# Patient Record
Sex: Male | Born: 1940 | Race: White | Hispanic: No | Marital: Married | Smoking: Former smoker
Health system: Southern US, Community
[De-identification: ages and names within clinical notes are randomized; demographics above are authoritative.]

## PROBLEM LIST (undated history)

## (undated) DIAGNOSIS — I2510 Atherosclerotic heart disease of native coronary artery without angina pectoris: Secondary | ICD-10-CM

## (undated) DIAGNOSIS — E785 Hyperlipidemia, unspecified: Secondary | ICD-10-CM

## (undated) DIAGNOSIS — H409 Unspecified glaucoma: Secondary | ICD-10-CM

## (undated) DIAGNOSIS — N289 Disorder of kidney and ureter, unspecified: Secondary | ICD-10-CM

## (undated) DIAGNOSIS — G629 Polyneuropathy, unspecified: Secondary | ICD-10-CM

## (undated) DIAGNOSIS — J984 Other disorders of lung: Secondary | ICD-10-CM

## (undated) DIAGNOSIS — N189 Chronic kidney disease, unspecified: Secondary | ICD-10-CM

## (undated) DIAGNOSIS — K769 Liver disease, unspecified: Secondary | ICD-10-CM

## (undated) DIAGNOSIS — J189 Pneumonia, unspecified organism: Secondary | ICD-10-CM

## (undated) DIAGNOSIS — I519 Heart disease, unspecified: Secondary | ICD-10-CM

## (undated) DIAGNOSIS — G709 Myoneural disorder, unspecified: Secondary | ICD-10-CM

## (undated) DIAGNOSIS — G4730 Sleep apnea, unspecified: Secondary | ICD-10-CM

## (undated) DIAGNOSIS — I219 Acute myocardial infarction, unspecified: Secondary | ICD-10-CM

## (undated) DIAGNOSIS — C801 Malignant (primary) neoplasm, unspecified: Secondary | ICD-10-CM

## (undated) DIAGNOSIS — I10 Essential (primary) hypertension: Secondary | ICD-10-CM

## (undated) DIAGNOSIS — Z87442 Personal history of urinary calculi: Secondary | ICD-10-CM

## (undated) HISTORY — PX: CORONARY ARTERY BYPASS GRAFT: SHX141

## (undated) HISTORY — DX: Heart disease, unspecified: I51.9

## (undated) HISTORY — PX: LUNG SURGERY: SHX703

## (undated) HISTORY — DX: Liver disease, unspecified: K76.9

## (undated) HISTORY — DX: Sleep apnea, unspecified: G47.30

## (undated) HISTORY — DX: Essential (primary) hypertension: I10

## (undated) HISTORY — DX: Other disorders of lung: J98.4

## (undated) HISTORY — PX: EYE SURGERY: SHX253

## (undated) HISTORY — DX: Chronic kidney disease, unspecified: N18.9

---

## 1996-01-14 HISTORY — PX: CORONARY ARTERY BYPASS GRAFT: SHX141

## 1997-04-11 ENCOUNTER — Encounter (HOSPITAL_COMMUNITY): Admission: RE | Admit: 1997-04-11 | Discharge: 1997-07-10 | Payer: Self-pay | Admitting: Family Medicine

## 1997-04-13 ENCOUNTER — Encounter (HOSPITAL_COMMUNITY): Admission: RE | Admit: 1997-04-13 | Discharge: 1997-07-12 | Payer: Self-pay | Admitting: Cardiovascular Disease

## 2003-01-09 ENCOUNTER — Encounter: Admission: RE | Admit: 2003-01-09 | Discharge: 2003-01-09 | Payer: Self-pay | Admitting: Cardiology

## 2003-01-19 ENCOUNTER — Encounter: Admission: RE | Admit: 2003-01-19 | Discharge: 2003-01-19 | Payer: Self-pay | Admitting: Cardiology

## 2008-06-22 ENCOUNTER — Encounter: Admission: RE | Admit: 2008-06-22 | Discharge: 2008-06-22 | Payer: Self-pay | Admitting: Cardiology

## 2008-06-30 ENCOUNTER — Ambulatory Visit (HOSPITAL_COMMUNITY): Admission: RE | Admit: 2008-06-30 | Discharge: 2008-06-30 | Payer: Self-pay | Admitting: Cardiology

## 2008-07-26 ENCOUNTER — Encounter: Payer: Self-pay | Admitting: Cardiovascular Disease

## 2008-07-26 ENCOUNTER — Encounter: Payer: Self-pay | Admitting: Internal Medicine

## 2008-08-28 ENCOUNTER — Encounter: Admission: RE | Admit: 2008-08-28 | Discharge: 2008-08-28 | Payer: Self-pay | Admitting: Chiropractic Medicine

## 2009-10-18 ENCOUNTER — Ambulatory Visit (HOSPITAL_COMMUNITY): Admission: RE | Admit: 2009-10-18 | Discharge: 2009-10-18 | Payer: Self-pay | Admitting: Gastroenterology

## 2010-05-28 NOTE — Cardiovascular Report (Signed)
Joseph Bryan, REISTER NO.:  [id number]   MEDICAL RECORD NO.:  [id number]          PATIENT TYPE:  OIB   LOCATION:  2899                         FACILITY:  [facility]   PHYSICIAN:  Cristy Hilts. Jacinto Halim, MD       DATE OF BIRTH:  September 23, 1940   DATE OF PROCEDURE:  06/30/2008  DATE OF DISCHARGE:  06/30/2008                            CARDIAC CATHETERIZATION   PROCEDURES PERFORMED:  1. Left ventriculography.  2. Selective right and left coronary arteriography.  3. Saphenous vein graft and left internal mammary arteriography.   INDICATIONS:  Mr. Engram is a 70 year old gentleman with morbid obesity,  known coronary artery disease status post CABG in 1998 with LIMA to LAD,  SVG to OM-1 and OM-3, and history of anterior wall myocardial infarction  prior to his bypass surgery for which he underwent balloon angioplasty  at that time followed by bypass surgery the next day.  He had been  complaining of increasing shortness of breath.  Given this, he is now  brought to the cardiac catheterization lab to evaluate his coronary  anatomy for progression of coronary disease.  He is morbidly obese  gentleman.   HEMODYNAMIC DATA:  The left ventricular pressure was 77/3 with end-  diastolic of 9 mmHg.  Aortic pressure was 73/48 with a mean of 58 mmHg.  There was no significant gradient across the aortic valve.   ANGIOGRAPHIC DATA:  Left ventricle.  Left ventricular systolic function  was normal with the ejection fraction of 55%.  There was no regional  wall motion abnormality.   Right coronary artery.  Right coronary artery was a large-caliber  dominant vessel.  It has got mild luminal irregularity.   Left main coronary artery.  Left main coronary artery is a moderate-  caliber vessel with a distal left main of 40% stenoses.   Circumflex coronary artery.  Circumflex coronary artery has an ostial  80% stenosis.  It gives origin to large obtuse marginal and obtuse  marginal 2.  The obtuse  marginal 1 is occluded and is supplied by  saphenous vein graft.  There is competitive filling noted in the distal  circumflex coronary artery.   SVG to OM-1 and OM-2.  SVG to OM-1 and OM-2 is widely patent.   LAD.  LAD is a large-caliber vessel giving origin to large diagonal 1.  The diagonal 1 has an ostial 80-90% stenosis.  The ostium of the LAD has  a 70-80% stenosis.  There is complicated filling and retrograde filling  of the LAD via the saphenous vein graft to the diagonal.   SVG to D1.  SVG to D1 is widely patent and retrogradely fills the LAD.  There is also competitive filling noted in the distal LAD.   LIMA to LAD.  LIMA to LAD is widely patent.   IMPRESSION:  1. Shortness of breath probably secondary to diastolic dysfunction and      morbid obesity.  2. Hypotension resolved with intravenous fluids.  The patient had      severe nausea and vomiting last night, I suspect he probably has  acute gastritis, which is resolved, feels comfortable now.   RECOMMENDATIONS:  1. We will place him on Ranexa at 5 mg p.o. b.i.d. to see whether this      symptoms of shortness breath will improve, which could be his      anginal equivalent as previously his myocardial infarction was      evident from shortness of breath.  If he does not respond to      Ranexa, we can certainly stop this medication.  2. Weight loss would certainly help.  We will be discharging him today      if he remains nausea free and we will follow him up in the      outpatient basis.   Total of 130 mL of contrast was utilized for diagnostic angiography.   TECHNIQUE OF PROCEDURE:  Under usual sterile precaution using a left  radial access using 5-French sheath, a Jackey  5-French catheter was advanced into the left ventricle and left  ventriculography was performed both in LAO and RAO projection.  Then,  the same catheter was utilized to engage the left and right coronary  arteries and also saphenous vein  graft.  The catheter was then pulled  out of body.  A 4-French LIMA catheter was utilized to engage the left  internal mammary artery and angiography was performed using the catheter  and pulled out of the body.   The radial access sheath was pulled out after applying the TR band with  adequate hemostasis.  The tolerated the procedure well.  No immediate  complications were noted.      Cristy Hilts. Jacinto Halim, MD     JRG/MEDQ  D:  06/30/2008  T:  07/01/2008  Job:  [id number]   cc:   Windle Guard, M.D.

## 2012-01-14 HISTORY — PX: APPENDECTOMY: SHX54

## 2012-08-25 ENCOUNTER — Inpatient Hospital Stay (HOSPITAL_COMMUNITY)
Admission: EM | Admit: 2012-08-25 | Discharge: 2012-09-05 | DRG: 330 | Disposition: A | Discharge status: Home / Self Care | Payer: Medicare Other | Attending: Surgery | Admitting: Surgery

## 2012-08-25 ENCOUNTER — Encounter (HOSPITAL_COMMUNITY): Payer: Self-pay | Admitting: Emergency Medicine

## 2012-08-25 ENCOUNTER — Emergency Department (HOSPITAL_COMMUNITY): Payer: Medicare Other

## 2012-08-25 DIAGNOSIS — E669 Obesity, unspecified: Secondary | ICD-10-CM

## 2012-08-25 DIAGNOSIS — C181 Malignant neoplasm of appendix: Principal | ICD-10-CM | POA: Diagnosis present

## 2012-08-25 DIAGNOSIS — Z6841 Body Mass Index (BMI) 40.0 and over, adult: Secondary | ICD-10-CM

## 2012-08-25 DIAGNOSIS — I4510 Unspecified right bundle-branch block: Secondary | ICD-10-CM | POA: Diagnosis present

## 2012-08-25 DIAGNOSIS — I2510 Atherosclerotic heart disease of native coronary artery without angina pectoris: Secondary | ICD-10-CM

## 2012-08-25 DIAGNOSIS — G4733 Obstructive sleep apnea (adult) (pediatric): Secondary | ICD-10-CM | POA: Diagnosis present

## 2012-08-25 DIAGNOSIS — R601 Generalized edema: Secondary | ICD-10-CM

## 2012-08-25 DIAGNOSIS — E785 Hyperlipidemia, unspecified: Secondary | ICD-10-CM | POA: Diagnosis present

## 2012-08-25 DIAGNOSIS — Z87891 Personal history of nicotine dependence: Secondary | ICD-10-CM

## 2012-08-25 DIAGNOSIS — K529 Noninfective gastroenteritis and colitis, unspecified: Secondary | ICD-10-CM

## 2012-08-25 DIAGNOSIS — I10 Essential (primary) hypertension: Secondary | ICD-10-CM

## 2012-08-25 DIAGNOSIS — R000 Tachycardia, unspecified: Secondary | ICD-10-CM

## 2012-08-25 DIAGNOSIS — R609 Edema, unspecified: Secondary | ICD-10-CM | POA: Diagnosis not present

## 2012-08-25 DIAGNOSIS — Z951 Presence of aortocoronary bypass graft: Secondary | ICD-10-CM

## 2012-08-25 DIAGNOSIS — I498 Other specified cardiac arrhythmias: Secondary | ICD-10-CM | POA: Diagnosis present

## 2012-08-25 DIAGNOSIS — R1031 Right lower quadrant pain: Secondary | ICD-10-CM

## 2012-08-25 DIAGNOSIS — M7989 Other specified soft tissue disorders: Secondary | ICD-10-CM | POA: Diagnosis not present

## 2012-08-25 DIAGNOSIS — Z9861 Coronary angioplasty status: Secondary | ICD-10-CM

## 2012-08-25 DIAGNOSIS — E876 Hypokalemia: Secondary | ICD-10-CM | POA: Diagnosis not present

## 2012-08-25 DIAGNOSIS — A0472 Enterocolitis due to Clostridium difficile, not specified as recurrent: Secondary | ICD-10-CM

## 2012-08-25 DIAGNOSIS — A4902 Methicillin resistant Staphylococcus aureus infection, unspecified site: Secondary | ICD-10-CM | POA: Diagnosis present

## 2012-08-25 DIAGNOSIS — E871 Hypo-osmolality and hyponatremia: Secondary | ICD-10-CM | POA: Diagnosis not present

## 2012-08-25 DIAGNOSIS — K559 Vascular disorder of intestine, unspecified: Secondary | ICD-10-CM | POA: Diagnosis present

## 2012-08-25 DIAGNOSIS — E8779 Other fluid overload: Secondary | ICD-10-CM | POA: Diagnosis not present

## 2012-08-25 HISTORY — DX: Atherosclerotic heart disease of native coronary artery without angina pectoris: I25.10

## 2012-08-25 HISTORY — DX: Acute myocardial infarction, unspecified: I21.9

## 2012-08-25 LAB — CBC WITH DIFFERENTIAL/PLATELET
Basophils Absolute: 0 10*3/uL (ref 0.0–0.1)
Basophils Relative: 0 % (ref 0–1)
Eosinophils Absolute: 0 10*3/uL (ref 0.0–0.7)
Eosinophils Relative: 0 % (ref 0–5)
HCT: 42.9 % (ref 39.0–52.0)
Hemoglobin: 13.8 g/dL (ref 13.0–17.0)
Lymphocytes Relative: 8 % — ABNORMAL LOW (ref 12–46)
Lymphs Abs: 1.4 10*3/uL (ref 0.7–4.0)
MCH: 29.5 pg (ref 26.0–34.0)
MCHC: 32.2 g/dL (ref 30.0–36.0)
MCV: 91.7 fL (ref 78.0–100.0)
Monocytes Absolute: 1.1 10*3/uL — ABNORMAL HIGH (ref 0.1–1.0)
Monocytes Relative: 7 % (ref 3–12)
Neutro Abs: 13.8 10*3/uL — ABNORMAL HIGH (ref 1.7–7.7)
Neutrophils Relative %: 85 % — ABNORMAL HIGH (ref 43–77)
Platelets: 224 10*3/uL (ref 150–400)
RBC: 4.68 MIL/uL (ref 4.22–5.81)
RDW: 14.2 % (ref 11.5–15.5)
WBC: 16.3 10*3/uL — ABNORMAL HIGH (ref 4.0–10.5)

## 2012-08-25 LAB — URINALYSIS, ROUTINE W REFLEX MICROSCOPIC
Bilirubin Urine: NEGATIVE
Glucose, UA: NEGATIVE mg/dL
Hgb urine dipstick: NEGATIVE
Ketones, ur: NEGATIVE mg/dL
Nitrite: NEGATIVE
Protein, ur: NEGATIVE mg/dL
Specific Gravity, Urine: 1.018 (ref 1.005–1.030)
Urobilinogen, UA: 0.2 mg/dL (ref 0.0–1.0)
pH: 6 (ref 5.0–8.0)

## 2012-08-25 LAB — COMPREHENSIVE METABOLIC PANEL
ALT: 29 U/L (ref 0–53)
AST: 26 U/L (ref 0–37)
Albumin: 3.6 g/dL (ref 3.5–5.2)
Alkaline Phosphatase: 121 U/L — ABNORMAL HIGH (ref 39–117)
BUN: 14 mg/dL (ref 6–23)
CO2: 30 mEq/L (ref 19–32)
Calcium: 9.7 mg/dL (ref 8.4–10.5)
Chloride: 94 mEq/L — ABNORMAL LOW (ref 96–112)
Creatinine, Ser: 0.94 mg/dL (ref 0.50–1.35)
GFR calc Af Amer: >90 mL/min (ref >90)
GFR calc non Af Amer: 82 mL/min — ABNORMAL LOW (ref >90)
Glucose, Bld: 117 mg/dL — ABNORMAL HIGH (ref 70–99)
Potassium: 3.9 mEq/L (ref 3.5–5.1)
Sodium: 131 mEq/L — ABNORMAL LOW (ref 135–145)
Total Bilirubin: 0.7 mg/dL (ref 0.3–1.2)
Total Protein: 7.8 g/dL (ref 6.0–8.3)

## 2012-08-25 LAB — URINE MICROSCOPIC-ADD ON

## 2012-08-25 MED ORDER — MORPHINE SULFATE 4 MG/ML IJ SOLN
4.0000 mg | Freq: Once | INTRAMUSCULAR | Status: AC
  Administered 2012-08-25: 4 mg via INTRAVENOUS
  Filled 2012-08-25: qty 1

## 2012-08-25 MED ORDER — ONDANSETRON HCL 4 MG/2ML IJ SOLN
4.0000 mg | Freq: Once | INTRAMUSCULAR | Status: AC
  Administered 2012-08-25: 4 mg via INTRAVENOUS
  Filled 2012-08-25: qty 2

## 2012-08-25 MED ORDER — PIPERACILLIN-TAZOBACTAM 3.375 G IVPB
3.3750 g | Freq: Once | INTRAVENOUS | Status: AC
  Administered 2012-08-25: 3.375 g via INTRAVENOUS
  Filled 2012-08-25: qty 50

## 2012-08-25 MED ORDER — IOHEXOL 300 MG/ML  SOLN
100.0000 mL | Freq: Once | INTRAMUSCULAR | Status: AC | PRN
  Administered 2012-08-25: 100 mL via INTRAVENOUS

## 2012-08-25 MED ORDER — IOHEXOL 300 MG/ML  SOLN
50.0000 mL | Freq: Once | INTRAMUSCULAR | Status: AC | PRN
  Administered 2012-08-25: 50 mL via ORAL

## 2012-08-25 MED ORDER — SODIUM CHLORIDE 0.9 % IV BOLUS (SEPSIS)
500.0000 mL | Freq: Once | INTRAVENOUS | Status: AC
  Administered 2012-08-25: 18:00:00 via INTRAVENOUS

## 2012-08-25 NOTE — H&P (Signed)
Joseph Bryan is an 72 y.o. male.   Chief Complaint: abdominal pain HPI: The pt is a 72 yo wm who first started having RLQ pain last night. He denies any fever or chills. He had some dry heaves last night and this am but feels better now and is hungry. He went to his medical doctor today who sent him to ER to get a CT scan. His scan shows circumferential thickening with some inflammation of cecum. Appendix was not well visualized. Appearance seems unusual for appendicitis and seems more consistent with colitis or possible tumor. WBC is 16.3  Past Medical History  Diagnosis Date  . Coronary artery disease     Past Surgical History  Procedure Laterality Date  . Lung surgery      right and left lungs - "had air pockets"  . Coronary artery bypass graft      History reviewed. No pertinent family history. Social History:  reports that he quit smoking about 24 years ago. His smoking use included Cigarettes. He smoked 0.00 packs per day. He has never used smokeless tobacco. He reports that he does not drink alcohol or use illicit drugs.  Allergies: No Known Allergies   (Not in a hospital admission)  Results for orders placed during the hospital encounter of 08/25/12 (from the past 48 hour(s))  URINALYSIS, ROUTINE W REFLEX MICROSCOPIC     Status: Abnormal   Collection Time    08/25/12  5:26 PM      Result Value Range   Color, Urine YELLOW  YELLOW   APPearance CLEAR  CLEAR   Specific Gravity, Urine 1.018  1.005 - 1.030   pH 6.0  5.0 - 8.0   Glucose, UA NEGATIVE  NEGATIVE mg/dL   Hgb urine dipstick NEGATIVE  NEGATIVE   Bilirubin Urine NEGATIVE  NEGATIVE   Ketones, ur NEGATIVE  NEGATIVE mg/dL   Protein, ur NEGATIVE  NEGATIVE mg/dL   Urobilinogen, UA 0.2  0.0 - 1.0 mg/dL   Nitrite NEGATIVE  NEGATIVE   Leukocytes, UA SMALL (*) NEGATIVE  URINE MICROSCOPIC-ADD ON     Status: None   Collection Time    08/25/12  5:26 PM      Result Value Range   WBC, UA 0-2  <3 WBC/hpf  CBC WITH  DIFFERENTIAL     Status: Abnormal   Collection Time    08/25/12  6:14 PM      Result Value Range   WBC 16.3 (*) 4.0 - 10.5 K/uL   RBC 4.68  4.22 - 5.81 MIL/uL   Hemoglobin 13.8  13.0 - 17.0 g/dL   HCT 16.1  09.6 - 04.5 %   MCV 91.7  78.0 - 100.0 fL   MCH 29.5  26.0 - 34.0 pg   MCHC 32.2  30.0 - 36.0 g/dL   RDW 40.9  81.1 - 91.4 %   Platelets 224  150 - 400 K/uL   Neutrophils Relative % 85 (*) 43 - 77 %   Neutro Abs 13.8 (*) 1.7 - 7.7 K/uL   Lymphocytes Relative 8 (*) 12 - 46 %   Lymphs Abs 1.4  0.7 - 4.0 K/uL   Monocytes Relative 7  3 - 12 %   Monocytes Absolute 1.1 (*) 0.1 - 1.0 K/uL   Eosinophils Relative 0  0 - 5 %   Eosinophils Absolute 0.0  0.0 - 0.7 K/uL   Basophils Relative 0  0 - 1 %   Basophils Absolute 0.0  0.0 - 0.1  K/uL  COMPREHENSIVE METABOLIC PANEL     Status: Abnormal   Collection Time    08/25/12  6:14 PM      Result Value Range   Sodium 131 (*) 135 - 145 mEq/L   Potassium 3.9  3.5 - 5.1 mEq/L   Chloride 94 (*) 96 - 112 mEq/L   CO2 30  19 - 32 mEq/L   Glucose, Bld 117 (*) 70 - 99 mg/dL   BUN 14  6 - 23 mg/dL   Creatinine, Ser 5.62  0.50 - 1.35 mg/dL   Calcium 9.7  8.4 - 13.0 mg/dL   Total Protein 7.8  6.0 - 8.3 g/dL   Albumin 3.6  3.5 - 5.2 g/dL   AST 26  0 - 37 U/L   ALT 29  0 - 53 U/L   Alkaline Phosphatase 121 (*) 39 - 117 U/L   Total Bilirubin 0.7  0.3 - 1.2 mg/dL   GFR calc non Af Amer 82 (*) >90 mL/min   GFR calc Af Amer >90  >90 mL/min   Comment: (NOTE)     The eGFR has been calculated using the CKD EPI equation.     This calculation has not been validated in all clinical situations.     eGFR's persistently <90 mL/min signify possible Chronic Kidney     Disease.   Ct Abdomen Pelvis W Contrast  08/25/2012   *RADIOLOGY REPORT*  Clinical Data: Right lower quadrant abdominal pain with nausea for 1 day with leukocytosis.  CT ABDOMEN AND PELVIS WITH CONTRAST  Technique:  Multidetector CT imaging of the abdomen and pelvis was performed following the  standard protocol during bolus administration of intravenous contrast.  Contrast: OMNIPAQUE IOHEXOL 300 MG/ML  SOLN, 50mL OMNIPAQUE IOHEXOL 300 MG/ML  SOLN  Comparison: Prior CT 01/19/2003.  Findings: The lung bases are clear.  There is no pleural or pericardial effusion.  The gallbladder is distended and contains small gallstones.  There is no gallbladder wall thickening or pericholecystic fluid.  The liver, biliary system, spleen and adrenal glands appear normal. The pancreas demonstrates chronic atrophy which appears stable. There is a small parenchymal calcification within the pancreas.  The right kidney appears normal.  The left kidney demonstrates marked chronic atrophy with cortical thinning and limited contrast excretion.  There are small cysts in the upper pole.  There is moderate circumferential wall thickening of the cecum with mild surrounding inflammatory change.  The terminal ileum appears normal.  The appendix is not well delineated.  However, the appendix was present previously and was retrocecal in location. There is a tubular soft tissue structure in this area which could reflect an inflamed appendix.  There is no evidence of bowel obstruction or extravasated enteric contrast.  Soft tissue stranding in the base of the mesentery is similar to the prior study.  There is no discrete adenopathy or extraluminal fluid collection.  Mild aorto iliac atherosclerosis appears stable. The bladder and seminal vesicles appear normal.  There are asymmetric calcifications within the right lobe of the prostate gland.  IMPRESSION:  1.  Abnormal examination with circumferential wall thickening of the cecum, surrounding inflammation and probable distension of the appendix.  This degree of cecal wall thickening would be atypical for early acute appendicitis, and I would favor focal colitis with possible secondary appendiceal inflammation. However, cecal neoplasm and atypical appendicitis cannot be completely  excluded. 2.  No evidence of perforation, abscess or terminal ileal inflammation. 3.  Marked left renal atrophy, similar  to prior examination.  4.  Cholelithiasis without evidence of cholecystitis.  These results were called by telephone on 08/25/2012 at 2030 hours to Dr. Elesa Massed, who verbally acknowledged these results.   Original Report Authenticated By: Carey Bullocks, M.D.    Review of Systems  Constitutional: Negative.   HENT: Negative.   Eyes: Negative.   Respiratory: Negative.   Cardiovascular: Negative.   Gastrointestinal: Positive for nausea, vomiting and abdominal pain.  Genitourinary: Negative.   Musculoskeletal: Negative.   Skin: Negative.   Neurological: Negative.   Endo/Heme/Allergies: Negative.   Psychiatric/Behavioral: Negative.     Blood pressure 116/68, pulse 125, temperature 99.1 F (37.3 C), temperature source Oral, resp. rate 20, SpO2 92.00%. Physical Exam  Constitutional: He is oriented to person, place, and time.  Obese wm in no acute distress  HENT:  Head: Normocephalic and atraumatic.  Eyes: Conjunctivae and EOM are normal. Pupils are equal, round, and reactive to light.  Neck: Normal range of motion. Neck supple.  Cardiovascular: Normal rate, regular rhythm and normal heart sounds.   tachycardic  Respiratory: Effort normal and breath sounds normal.  GI: Soft. Bowel sounds are normal.  He has moderate tenderness focally in RLQ. The rest of his abdomen is soft and nontender  Musculoskeletal: Normal range of motion.  Neurological: He is alert and oriented to person, place, and time.  Skin: Skin is warm and dry.  Psychiatric: He has a normal mood and affect. His behavior is normal.     Assessment/Plan The pt has some circumferential thickening of the cecum. Although appendicitis is in the differential the appearance seems more consistent with colitis or possible tumor. I think it would be reasonable to treat him with broad spectrum abx. If he improves then he  may need GI work up with colonoscopy to rule out tumor. If he does not improve then we may have to explore him. I have discussed the treatment plan with him and his family in detail and they agree  TOTH III,PAUL S 08/25/2012, 10:40 PM

## 2012-08-25 NOTE — ED Provider Notes (Signed)
TIME SEEN: 4:23 PM  CHIEF COMPLAINT: Abdominal pain  HPI: Patient is a 72 year old male with history of CAD status post CABG in 1998, MI, diastolic CHF who presents emergency department with right lower quadrant pain that started yesterday in the early morning.  He reports the pain is achy and he has no aggravating or alleviating factors. He has had night sweats some anorexia and nausea. No fever, chills, vomiting, diarrhea, bloody stools or melena, dysuria or hematuria, penile discharge, testicular pain or scrotal swelling. He has never had any abdominal surgeries. He was seen by his primary care physician, Dr. Jeannetta Nap in [facility], who is concerned for appendicitis and sent him here to the emergency department.  ROS: See HPI Constitutional: no fever  Eyes: no drainage  ENT: no runny nose   Cardiovascular:  no chest pain  Resp: no SOB  GI: no vomiting GU: no dysuria Integumentary: no rash  Allergy: no hives  Musculoskeletal: no leg swelling  Neurological: no slurred speech ROS otherwise negative  PAST MEDICAL HISTORY/PAST SURGICAL HISTORY:  Past Medical History  Diagnosis Date  . Coronary artery disease     MEDICATIONS:  Prior to Admission medications   Medication Sig Start Date End Date Taking? Authorizing Provider  atenolol (TENORMIN) 100 MG tablet Take 100 mg by mouth daily.   Yes Historical Provider, MD  Glucosamine-Chondroitin (OSTEO BI-FLEX REGULAR STRENGTH PO) Take 1 tablet by mouth.   Yes Historical Provider, MD  lisinopril (PRINIVIL,ZESTRIL) 10 MG tablet Take 10 mg by mouth daily.   Yes Historical Provider, MD  triamterene-hydrochlorothiazide (MAXZIDE-25) 37.5-25 MG per tablet Take 1 tablet by mouth daily.   Yes Historical Provider, MD  vitamin B-12 (CYANOCOBALAMIN) 1000 MCG tablet Take 1,000 mcg by mouth daily.   Yes Historical Provider, MD  vitamin B-6 (PYRIDOXINE) 25 MG tablet Take 25 mg by mouth daily.   Yes Historical Provider, MD    ALLERGIES:  No Known  Allergies  SOCIAL HISTORY:  History  Substance Use Topics  . Smoking status: Never Smoker   . Smokeless tobacco: Not on file  . Alcohol Use: No    FAMILY HISTORY: No family history on file.  EXAM: BP 144/73  Pulse 84  Temp(Src) 97.6 F (36.4 C) (Oral)  Resp 14  SpO2 99% CONSTITUTIONAL: Alert and oriented and responds appropriately to questions. Well-appearing; well-nourished HEAD: Normocephalic EYES: Conjunctivae clear, PERRL ENT: normal nose; no rhinorrhea; moist mucous membranes; pharynx without lesions noted NECK: Supple, no meningismus, no LAD  CARD: RRR; S1 and S2 appreciated; no murmurs, no clicks, no rubs, no gallops RESP: Normal chest excursion without splinting or tachypnea; breath sounds clear and equal bilaterally; no wheezes, no rhonchi, no rales,  ABD/GI: Morbidly obese, normal bowel sounds, soft, tender to palpation at McBurney's point, no rebound or guarding, no peritoneal signs GU:  No penile discharge, no testicular pain or masses, no scrotal swelling, 2+ femoral pulses bilaterally, no inguinal lymphadenopathy, no genital lesions BACK:  The back appears normal and is non-tender to palpation, there is no CVA tenderness EXT: Normal ROM in all joints; non-tender to palpation; no edema; normal capillary refill; no cyanosis    SKIN: Normal color for age and race; warm NEURO: Moves all extremities equally PSYCH: The patient's mood and manner are appropriate. Grooming and personal hygiene are appropriate.  MEDICAL DECISION MAKING: Patient with right lower quadrant pain that started approximately 24 hours ago. Concern for possible appendicitis. Will obtain labs, urine, CT scan. Patient is hemodynamically stable.  ED PROGRESS:  7:10 PM  Pt labs show leukocytosis with left shift. Urine shows no sign of infection. CT abdomen and pelvis is pending. Patient is hemodynamically stable.  9:15 PM  CT shows cecal wall thickening and inflammation. Appendix is not visualized.  This is likely cecal colitis however atypical appendicitis cannot be ruled out. Discussed with general surgery who have seen the emergency department. We'll give Zosyn. Patient is still hemodynamically stable and n.p.o.  10:26 PM  Pt began to have sinus tachycardia and mild hypoxia. Denies any chest pain or shortness of breath. Denies any allergy to contrast were penicillin. He has no urticaria, wheezing, angioedema. He is afebrile. Temperature 98.7. EKG shows sinus tachycardia with right bundle branch block and left anterior fascicular block. No old for comparison.  Pt on monitor.  Patient has received less than IV fluids. He has a history of heart failure. We'll closely monitor. Surgery at bedside.   Date: 08/25/2012 22:13  Rate: 121  Rhythm: sinus tachycardia  QRS Axis: normal  Intervals: wide QRS  ST/T Wave abnormalities: normal  Conduction Disutrbances: RBBB and LAFB  Narrative Interpretation: RBBB, LAFB, sinus tachycardia   11:06 PM  Surgery to admit for colitis.  Dr. Carolynne Edouard has seen.  Layla Maw Ward, DO 08/25/12 [id number]

## 2012-08-25 NOTE — Progress Notes (Signed)
Patient reports his pcp is Dr. Jeannetta Nap in [place].

## 2012-08-25 NOTE — ED Notes (Signed)
Pt on cardiac monitor.

## 2012-08-25 NOTE — ED Notes (Signed)
Pt c/o rlq abd pain x1 day. Reports nausea, denies vomiting, denies hx of kidney stones.

## 2012-08-26 ENCOUNTER — Encounter (HOSPITAL_COMMUNITY): Payer: Self-pay | Admitting: General Surgery

## 2012-08-26 DIAGNOSIS — R1031 Right lower quadrant pain: Secondary | ICD-10-CM

## 2012-08-26 DIAGNOSIS — K5289 Other specified noninfective gastroenteritis and colitis: Secondary | ICD-10-CM

## 2012-08-26 DIAGNOSIS — E669 Obesity, unspecified: Secondary | ICD-10-CM | POA: Diagnosis present

## 2012-08-26 DIAGNOSIS — I2510 Atherosclerotic heart disease of native coronary artery without angina pectoris: Secondary | ICD-10-CM

## 2012-08-26 DIAGNOSIS — I10 Essential (primary) hypertension: Secondary | ICD-10-CM | POA: Diagnosis present

## 2012-08-26 DIAGNOSIS — R52 Pain, unspecified: Secondary | ICD-10-CM

## 2012-08-26 DIAGNOSIS — D72829 Elevated white blood cell count, unspecified: Secondary | ICD-10-CM

## 2012-08-26 LAB — CBC
Hemoglobin: 12.1 g/dL — ABNORMAL LOW (ref 13.0–17.0)
Platelets: 183 10*3/uL (ref 150–400)
RBC: 4.2 MIL/uL — ABNORMAL LOW (ref 4.22–5.81)
WBC: 14.3 10*3/uL — ABNORMAL HIGH (ref 4.0–10.5)

## 2012-08-26 LAB — COMPREHENSIVE METABOLIC PANEL
ALT: 23 U/L (ref 0–53)
AST: 26 U/L (ref 0–37)
Alkaline Phosphatase: 96 U/L (ref 39–117)
CO2: 29 mEq/L (ref 19–32)
Chloride: 100 mEq/L (ref 96–112)
GFR calc non Af Amer: 80 mL/min — ABNORMAL LOW (ref >90)
Potassium: 4.3 mEq/L (ref 3.5–5.1)
Sodium: 133 mEq/L — ABNORMAL LOW (ref 135–145)
Total Bilirubin: 0.8 mg/dL (ref 0.3–1.2)

## 2012-08-26 MED ORDER — METOPROLOL TARTRATE 1 MG/ML IV SOLN
5.0000 mg | Freq: Four times a day (QID) | INTRAVENOUS | Status: DC
  Administered 2012-08-26 – 2012-08-28 (×8): 5 mg via INTRAVENOUS
  Filled 2012-08-26 (×14): qty 5

## 2012-08-26 MED ORDER — CHLORHEXIDINE GLUCONATE CLOTH 2 % EX PADS
6.0000 | MEDICATED_PAD | Freq: Every day | CUTANEOUS | Status: AC
  Administered 2012-08-26 – 2012-08-30 (×5): 6 via TOPICAL

## 2012-08-26 MED ORDER — MUPIROCIN 2 % EX OINT
1.0000 "application " | TOPICAL_OINTMENT | Freq: Two times a day (BID) | CUTANEOUS | Status: AC
  Administered 2012-08-26 – 2012-08-30 (×10): 1 via NASAL
  Filled 2012-08-26: qty 22

## 2012-08-26 MED ORDER — MORPHINE SULFATE 2 MG/ML IJ SOLN
2.0000 mg | INTRAMUSCULAR | Status: DC | PRN
  Administered 2012-08-26 – 2012-09-01 (×44): 2 mg via INTRAVENOUS
  Filled 2012-08-26 (×45): qty 1

## 2012-08-26 MED ORDER — ONDANSETRON HCL 4 MG/2ML IJ SOLN
4.0000 mg | Freq: Four times a day (QID) | INTRAMUSCULAR | Status: DC | PRN
  Administered 2012-08-31 – 2012-09-03 (×3): 4 mg via INTRAVENOUS
  Filled 2012-08-26 (×3): qty 2

## 2012-08-26 MED ORDER — BIOTENE DRY MOUTH MT LIQD
15.0000 mL | Freq: Two times a day (BID) | OROMUCOSAL | Status: DC
  Administered 2012-08-26 – 2012-09-05 (×21): 15 mL via OROMUCOSAL

## 2012-08-26 MED ORDER — PIPERACILLIN-TAZOBACTAM 3.375 G IVPB
3.3750 g | Freq: Three times a day (TID) | INTRAVENOUS | Status: DC
  Administered 2012-08-26 – 2012-08-27 (×5): 3.375 g via INTRAVENOUS
  Filled 2012-08-26 (×7): qty 50

## 2012-08-26 MED ORDER — PANTOPRAZOLE SODIUM 40 MG IV SOLR
40.0000 mg | Freq: Every day | INTRAVENOUS | Status: DC
  Administered 2012-08-26 – 2012-09-04 (×11): 40 mg via INTRAVENOUS
  Filled 2012-08-26 (×12): qty 40

## 2012-08-26 MED ORDER — KCL IN DEXTROSE-NACL 20-5-0.9 MEQ/L-%-% IV SOLN
INTRAVENOUS | Status: DC
  Administered 2012-08-26 – 2012-08-31 (×13): via INTRAVENOUS
  Filled 2012-08-26 (×19): qty 1000

## 2012-08-26 NOTE — Consult Note (Addendum)
Reason for Consult: Surgical Clearance Referring Physician: General Surgery PCP: Dr. Jeannetta Nap   HPI: The patient is a 72 y/o male, followed by Dr. Tresa Endo, who has been admitted to [facility] for evaluation of abdominal pain. The patient is suspected of having acute appendicitis vs colitis. The patient is tentatively scheduled for surgery tomorrow. [facility] has been consulted for surgical clearance. The patient has a history of known CAD as well as, HTN, HLD, obesity and obstructive sleep apnea, on CPAP therapy. He suffered an anterior wall myocardial infarction in November 1998 and underwent PTCA of a totally occluded proximal LAD, done by Dr. Tresa Endo. At that time, he also had high-grade distal left main stenosis with  ostial encroachment to his circumflex vessel. Following stabilization from his catheretization and with significant myocardial salvage arising from PCI, he underwent elective CABG revascularization surgery. This was done with a LIMA to the LAD, a vein to the diagonal, and a vein to the third marginal vessel. In 2010, he underwent another left heart cath after an abnormal nuclear stress test, that suggested ischemia. The cath demonstrated widely patent grafts. Ranexa, 500 mg BID, was also added to his regimen at that time. His last office visit with Dr. Tresa Endo was 11/26/2009, and, at that time, he was felt to be stable from a cardiovascular standpoint. Since that time, he denies any chest pain, both at rest and with exertion. No SOB, orthopnea, PND, syncope or presyncope. He continues to endorse RLQ abdominal pain, but denies any recent fever, chills, n/v, hematochezia or hematuria. He reports daily compliance with all of his prescribed medications.     Past Medical History  Diagnosis Date  . Coronary artery disease   . Myocardial infarction     Past Surgical History  Procedure Laterality Date  . Lung surgery      right and left lungs - "had air pockets"  . Coronary artery bypass graft      Family  History  Problem Relation Age of Onset  . Hypertension Father     Social History:  reports that he quit smoking about 24 years ago. His smoking use included Cigarettes. He smoked 0.00 packs per day. He has never used smokeless tobacco. He reports that he does not drink alcohol or use illicit drugs.  Allergies: No Known Allergies  Medications:  Prior to Admission medications   Medication Sig Start Date End Date Taking? Authorizing Provider  atenolol (TENORMIN) 100 MG tablet Take 100 mg by mouth daily.   Yes Historical Provider, MD  Glucosamine-Chondroitin (OSTEO BI-FLEX REGULAR STRENGTH PO) Take 1 tablet by mouth.   Yes Historical Provider, MD  lisinopril (PRINIVIL,ZESTRIL) 10 MG tablet Take 10 mg by mouth daily.   Yes Historical Provider, MD  triamterene-hydrochlorothiazide (MAXZIDE-25) 37.5-25 MG per tablet Take 1 tablet by mouth daily.   Yes Historical Provider, MD  vitamin B-12 (CYANOCOBALAMIN) 1000 MCG tablet Take 1,000 mcg by mouth daily.   Yes Historical Provider, MD  vitamin B-6 (PYRIDOXINE) 25 MG tablet Take 25 mg by mouth daily.   Yes Historical Provider, MD     Results for orders placed during the hospital encounter of 08/25/12 (from the past 48 hour(s))  URINALYSIS, ROUTINE W REFLEX MICROSCOPIC     Status: Abnormal   Collection Time    08/25/12  5:26 PM      Result Value Range   Color, Urine YELLOW  YELLOW   APPearance CLEAR  CLEAR   Specific Gravity, Urine 1.018  1.005 - 1.030   pH  6.0  5.0 - 8.0   Glucose, UA NEGATIVE  NEGATIVE mg/dL   Hgb urine dipstick NEGATIVE  NEGATIVE   Bilirubin Urine NEGATIVE  NEGATIVE   Ketones, ur NEGATIVE  NEGATIVE mg/dL   Protein, ur NEGATIVE  NEGATIVE mg/dL   Urobilinogen, UA 0.2  0.0 - 1.0 mg/dL   Nitrite NEGATIVE  NEGATIVE   Leukocytes, UA SMALL (*) NEGATIVE  URINE MICROSCOPIC-ADD ON     Status: None   Collection Time    08/25/12  5:26 PM      Result Value Range   WBC, UA 0-2  <3 WBC/hpf  CBC WITH DIFFERENTIAL     Status: Abnormal    Collection Time    08/25/12  6:14 PM      Result Value Range   WBC 16.3 (*) 4.0 - 10.5 K/uL   RBC 4.68  4.22 - 5.81 MIL/uL   Hemoglobin 13.8  13.0 - 17.0 g/dL   HCT 19.1  47.8 - 29.5 %   MCV 91.7  78.0 - 100.0 fL   MCH 29.5  26.0 - 34.0 pg   MCHC 32.2  30.0 - 36.0 g/dL   RDW 62.1  30.8 - 65.7 %   Platelets 224  150 - 400 K/uL   Neutrophils Relative % 85 (*) 43 - 77 %   Neutro Abs 13.8 (*) 1.7 - 7.7 K/uL   Lymphocytes Relative 8 (*) 12 - 46 %   Lymphs Abs 1.4  0.7 - 4.0 K/uL   Monocytes Relative 7  3 - 12 %   Monocytes Absolute 1.1 (*) 0.1 - 1.0 K/uL   Eosinophils Relative 0  0 - 5 %   Eosinophils Absolute 0.0  0.0 - 0.7 K/uL   Basophils Relative 0  0 - 1 %   Basophils Absolute 0.0  0.0 - 0.1 K/uL  COMPREHENSIVE METABOLIC PANEL     Status: Abnormal   Collection Time    08/25/12  6:14 PM      Result Value Range   Sodium 131 (*) 135 - 145 mEq/L   Potassium 3.9  3.5 - 5.1 mEq/L   Chloride 94 (*) 96 - 112 mEq/L   CO2 30  19 - 32 mEq/L   Glucose, Bld 117 (*) 70 - 99 mg/dL   BUN 14  6 - 23 mg/dL   Creatinine, Ser 8.46  0.50 - 1.35 mg/dL   Calcium 9.7  8.4 - 96.2 mg/dL   Total Protein 7.8  6.0 - 8.3 g/dL   Albumin 3.6  3.5 - 5.2 g/dL   AST 26  0 - 37 U/L   ALT 29  0 - 53 U/L   Alkaline Phosphatase 121 (*) 39 - 117 U/L   Total Bilirubin 0.7  0.3 - 1.2 mg/dL   GFR calc non Af Amer 82 (*) >90 mL/min   GFR calc Af Amer >90  >90 mL/min   Comment: (NOTE)     The eGFR has been calculated using the CKD EPI equation.     This calculation has not been validated in all clinical situations.     eGFR's persistently <90 mL/min signify possible Chronic Kidney     Disease.  MRSA PCR SCREENING     Status: Abnormal   Collection Time    08/26/12 12:03 AM      Result Value Range   MRSA by PCR POSITIVE (*) NEGATIVE   Comment:            The GeneXpert MRSA Assay (  FDA     approved for NASAL specimens     only), is one component of a     comprehensive MRSA colonization     surveillance  program. It is not     intended to diagnose MRSA     infection nor to guide or     monitor treatment for     MRSA infections.     RESULT CALLED TO, READ BACK BY AND VERIFIED WITH:     H JOHNSON AT 0341 ON 08.14.2014 BY NBROOKS  CBC     Status: Abnormal   Collection Time    08/26/12  4:08 AM      Result Value Range   WBC 14.3 (*) 4.0 - 10.5 K/uL   RBC 4.20 (*) 4.22 - 5.81 MIL/uL   Hemoglobin 12.1 (*) 13.0 - 17.0 g/dL   HCT 16.1  09.6 - 04.5 %   MCV 92.9  78.0 - 100.0 fL   MCH 28.8  26.0 - 34.0 pg   MCHC 31.0  30.0 - 36.0 g/dL   RDW 40.9  81.1 - 91.4 %   Platelets 183  150 - 400 K/uL  COMPREHENSIVE METABOLIC PANEL     Status: Abnormal   Collection Time    08/26/12  4:08 AM      Result Value Range   Sodium 133 (*) 135 - 145 mEq/L   Potassium 4.3  3.5 - 5.1 mEq/L   Chloride 100  96 - 112 mEq/L   CO2 29  19 - 32 mEq/L   Glucose, Bld 130 (*) 70 - 99 mg/dL   BUN 11  6 - 23 mg/dL   Creatinine, Ser 7.82  0.50 - 1.35 mg/dL   Calcium 8.6  8.4 - 95.6 mg/dL   Total Protein 6.4  6.0 - 8.3 g/dL   Albumin 2.7 (*) 3.5 - 5.2 g/dL   AST 26  0 - 37 U/L   ALT 23  0 - 53 U/L   Alkaline Phosphatase 96  39 - 117 U/L   Total Bilirubin 0.8  0.3 - 1.2 mg/dL   GFR calc non Af Amer 80 (*) >90 mL/min   GFR calc Af Amer >90  >90 mL/min   Comment: (NOTE)     The eGFR has been calculated using the CKD EPI equation.     This calculation has not been validated in all clinical situations.     eGFR's persistently <90 mL/min signify possible Chronic Kidney     Disease.    Ct Abdomen Pelvis W Contrast  08/25/2012   *RADIOLOGY REPORT*  Clinical Data: Right lower quadrant abdominal pain with nausea for 1 day with leukocytosis.  CT ABDOMEN AND PELVIS WITH CONTRAST  Technique:  Multidetector CT imaging of the abdomen and pelvis was performed following the standard protocol during bolus administration of intravenous contrast.  Contrast: OMNIPAQUE IOHEXOL 300 MG/ML  SOLN, 50mL OMNIPAQUE IOHEXOL 300 MG/ML   SOLN  Comparison: Prior CT 01/19/2003.  Findings: The lung bases are clear.  There is no pleural or pericardial effusion.  The gallbladder is distended and contains small gallstones.  There is no gallbladder wall thickening or pericholecystic fluid.  The liver, biliary system, spleen and adrenal glands appear normal. The pancreas demonstrates chronic atrophy which appears stable. There is a small parenchymal calcification within the pancreas.  The right kidney appears normal.  The left kidney demonstrates marked chronic atrophy with cortical thinning and limited contrast excretion.  There are small cysts in the  upper pole.  There is moderate circumferential wall thickening of the cecum with mild surrounding inflammatory change.  The terminal ileum appears normal.  The appendix is not well delineated.  However, the appendix was present previously and was retrocecal in location. There is a tubular soft tissue structure in this area which could reflect an inflamed appendix.  There is no evidence of bowel obstruction or extravasated enteric contrast.  Soft tissue stranding in the base of the mesentery is similar to the prior study.  There is no discrete adenopathy or extraluminal fluid collection.  Mild aorto iliac atherosclerosis appears stable. The bladder and seminal vesicles appear normal.  There are asymmetric calcifications within the right lobe of the prostate gland.  IMPRESSION:  1.  Abnormal examination with circumferential wall thickening of the cecum, surrounding inflammation and probable distension of the appendix.  This degree of cecal wall thickening would be atypical for early acute appendicitis, and I would favor focal colitis with possible secondary appendiceal inflammation. However, cecal neoplasm and atypical appendicitis cannot be completely excluded. 2.  No evidence of perforation, abscess or terminal ileal inflammation. 3.  Marked left renal atrophy, similar to prior examination.  4.   Cholelithiasis without evidence of cholecystitis.  These results were called by telephone on 08/25/2012 at 2030 hours to Dr. Elesa Massed, who verbally acknowledged these results.   Original Report Authenticated By: Carey Bullocks, M.D.    Review of Systems  Constitutional: Negative for fever and chills.  Respiratory: Negative for shortness of breath.   Cardiovascular: Negative for chest pain, palpitations, orthopnea, leg swelling and PND.  Gastrointestinal: Positive for abdominal pain. Negative for nausea, vomiting, diarrhea, constipation and blood in stool.  Neurological: Negative for dizziness and loss of consciousness.  All other systems reviewed and are negative.   Blood pressure 147/53, pulse 91, temperature 98.6 F (37 C), temperature source Oral, resp. rate 18, height 6\' 1"  (1.854 m), weight 297 lb 9.9 oz (135 kg), SpO2 96.00%. Physical Exam  Constitutional: He is oriented to person, place, and time. He appears well-developed and well-nourished. No distress.  Neck: No JVD present. Carotid bruit is not present.  Cardiovascular: Normal rate, regular rhythm and intact distal pulses.  Exam reveals no gallop and no friction rub.   No murmur heard. Pulses:      Radial pulses are 2+ on the right side, and 2+ on the left side.       Dorsalis pedis pulses are 2+ on the right side, and 2+ on the left side.  Respiratory: Breath sounds normal. No respiratory distress. He has no wheezes. He has no rales.  GI: Soft. Bowel sounds are normal. He exhibits distension. He exhibits no mass. There is tenderness (RLQ).  Musculoskeletal: He exhibits no edema.  Neurological: He is alert and oriented to person, place, and time.  Skin: Skin is warm and dry. He is not diaphoretic.  Psychiatric: He has a normal mood and affect. His behavior is normal.    Assessment/Plan: Active Problems:   Abdominal pain, acute, right lower quadrant   HTN (hypertension)   Obesity, unspecified   CAD- s/p CABG in 1998 - patent  grafts on cath in 2010  Plan: 72 y/o male with know CAD, s/p CABG in 1998 with patent grafts via cath in 2010, tentatively scheduled for laparoscopic surgery to evaluate for acute appendicitis vs colitis. Pt has LIMA to LAD, vein graft to diagonal and vein graft to third marginal branch. He denies any recent or past resting and exertional  angina. No shortness of breath or recent or past syncopal/near-syncopal episodes. His most recent EKG demonstrates sinus tachycardia, but no acute changes. His last echocardiogram was 06/09/2008 and demonstrated normal systolic function, with an EF of >55%. There were no aortic valve or root abnormalities and only mild mitral regurgitation. With patent grafts and no history of chest pain since last cath in 2010, I question the need for ischemic evaluation. However, will defer decision to his primary cardiologist Dr. Tresa Endo. At minimum, may need 2D echo and/or NST.   Allayne Butcher, PA-C 08/26/2012, 2:25 PM     Patient seen and examined. Agree with assessment and plan. Pt well known to me with Anterior MI 11/98 treated with emergent PTCA of occluded LAD, but due to significant concomitant CAD including LM, elective CABG was subsequently performed. He did have significant myocardial salvage. Last cath revealed patent grafts in 2010. He has remained stable from a cardiac standpoint since.No chest pain, SOB, or change in symptoms. He has old RBBB. Currently admitted with colitis and poorly visualized appendix with leukocytosis. No signs of CHF or angina history recently. ECG on admission with sinus tachycardia and RBBB and LAHB, without ischemic changes. OK to proceed with surgery if to be done tomorrow. However, if not to be done tomorrow with plans for continued antibiotics and observation then would obtain echo tomorrow to reassess lvfxn.  Will ultimately need to re-evaluate with nuclear scan electively since 14 yrs s/p CABG but this can be done later as outpatient  unless cardiac symptoms develop.   Lennette Bihari, MD, [facility] 08/26/2012 3:17 PM

## 2012-08-26 NOTE — Progress Notes (Signed)
Patient ID: Joseph Bryan, male   DOB: 28-Jun-1940, 72 y.o.   MRN: [id number]  Subjective: Pt feels about the same, denies chills or sweats.  Wife at bedside.   colonsocopy 2-3 years ago Dr. Laural Benes, negative.  Objective:  Vital signs:  Filed Vitals:   08/26/12 0400 08/26/12 0500 08/26/12 0600 08/26/12 0625  BP:  94/48 107/58   Pulse: 83 69 73 75  Temp: 100 F (37.8 C)     TempSrc: Oral     Resp: 20 13 13 17   Height:      Weight:      SpO2: 100% 99% 99% 99%    Last BM Date: 08/25/12  Intake/Output   Yesterday:  08/13 0701 - 08/14 0700 In: 700 [I.V.:700] Out: -   Physical Exam:  General: Pt awake/alert/oriented x3 and in no acute distress Chest: CTA No chest wall pain w good excursion CV:  Pulses intact.  Regular rhythm.  Trace pedal edema.  Abdomen: Soft. Obese.  Tender to RLQ and RUQ. No evidence of peritonitis.  No incarcerated hernias. Ext:  SCDs BLE.  No mjr edema.  No cyanosis Skin: No petechiae / purpura   Results:   Labs: Results for orders placed during the hospital encounter of 08/25/12 (from the past 48 hour(s))  URINALYSIS, ROUTINE W REFLEX MICROSCOPIC     Status: Abnormal   Collection Time    08/25/12  5:26 PM      Result Value Range   Color, Urine YELLOW  YELLOW   APPearance CLEAR  CLEAR   Specific Gravity, Urine 1.018  1.005 - 1.030   pH 6.0  5.0 - 8.0   Glucose, UA NEGATIVE  NEGATIVE mg/dL   Hgb urine dipstick NEGATIVE  NEGATIVE   Bilirubin Urine NEGATIVE  NEGATIVE   Ketones, ur NEGATIVE  NEGATIVE mg/dL   Protein, ur NEGATIVE  NEGATIVE mg/dL   Urobilinogen, UA 0.2  0.0 - 1.0 mg/dL   Nitrite NEGATIVE  NEGATIVE   Leukocytes, UA SMALL (*) NEGATIVE  URINE MICROSCOPIC-ADD ON     Status: None   Collection Time    08/25/12  5:26 PM      Result Value Range   WBC, UA 0-2  <3 WBC/hpf  CBC WITH DIFFERENTIAL     Status: Abnormal   Collection Time    08/25/12  6:14 PM      Result Value Range   WBC 16.3 (*) 4.0 - 10.5 K/uL   RBC 4.68  4.22 - 5.81  MIL/uL   Hemoglobin 13.8  13.0 - 17.0 g/dL   HCT 40.9  81.1 - 91.4 %   MCV 91.7  78.0 - 100.0 fL   MCH 29.5  26.0 - 34.0 pg   MCHC 32.2  30.0 - 36.0 g/dL   RDW 78.2  95.6 - 21.3 %   Platelets 224  150 - 400 K/uL   Neutrophils Relative % 85 (*) 43 - 77 %   Neutro Abs 13.8 (*) 1.7 - 7.7 K/uL   Lymphocytes Relative 8 (*) 12 - 46 %   Lymphs Abs 1.4  0.7 - 4.0 K/uL   Monocytes Relative 7  3 - 12 %   Monocytes Absolute 1.1 (*) 0.1 - 1.0 K/uL   Eosinophils Relative 0  0 - 5 %   Eosinophils Absolute 0.0  0.0 - 0.7 K/uL   Basophils Relative 0  0 - 1 %   Basophils Absolute 0.0  0.0 - 0.1 K/uL  COMPREHENSIVE METABOLIC PANEL  Status: Abnormal   Collection Time    08/25/12  6:14 PM      Result Value Range   Sodium 131 (*) 135 - 145 mEq/L   Potassium 3.9  3.5 - 5.1 mEq/L   Chloride 94 (*) 96 - 112 mEq/L   CO2 30  19 - 32 mEq/L   Glucose, Bld 117 (*) 70 - 99 mg/dL   BUN 14  6 - 23 mg/dL   Creatinine, Ser 1.61  0.50 - 1.35 mg/dL   Calcium 9.7  8.4 - 09.6 mg/dL   Total Protein 7.8  6.0 - 8.3 g/dL   Albumin 3.6  3.5 - 5.2 g/dL   AST 26  0 - 37 U/L   ALT 29  0 - 53 U/L   Alkaline Phosphatase 121 (*) 39 - 117 U/L   Total Bilirubin 0.7  0.3 - 1.2 mg/dL   GFR calc non Af Amer 82 (*) >90 mL/min   GFR calc Af Amer >90  >90 mL/min   Comment: (NOTE)     The eGFR has been calculated using the CKD EPI equation.     This calculation has not been validated in all clinical situations.     eGFR's persistently <90 mL/min signify possible Chronic Kidney     Disease.  MRSA PCR SCREENING     Status: Abnormal   Collection Time    08/26/12 12:03 AM      Result Value Range   MRSA by PCR POSITIVE (*) NEGATIVE   Comment:            The GeneXpert MRSA Assay (FDA     approved for NASAL specimens     only), is one component of a     comprehensive MRSA colonization     surveillance program. It is not     intended to diagnose MRSA     infection nor to guide or     monitor treatment for     MRSA  infections.     RESULT CALLED TO, READ BACK BY AND VERIFIED WITH:     H JOHNSON AT 0341 ON 08.14.2014 BY NBROOKS  CBC     Status: Abnormal   Collection Time    08/26/12  4:08 AM      Result Value Range   WBC 14.3 (*) 4.0 - 10.5 K/uL   RBC 4.20 (*) 4.22 - 5.81 MIL/uL   Hemoglobin 12.1 (*) 13.0 - 17.0 g/dL   HCT 04.5  40.9 - 81.1 %   MCV 92.9  78.0 - 100.0 fL   MCH 28.8  26.0 - 34.0 pg   MCHC 31.0  30.0 - 36.0 g/dL   RDW 91.4  78.2 - 95.6 %   Platelets 183  150 - 400 K/uL  COMPREHENSIVE METABOLIC PANEL     Status: Abnormal   Collection Time    08/26/12  4:08 AM      Result Value Range   Sodium 133 (*) 135 - 145 mEq/L   Potassium 4.3  3.5 - 5.1 mEq/L   Chloride 100  96 - 112 mEq/L   CO2 29  19 - 32 mEq/L   Glucose, Bld 130 (*) 70 - 99 mg/dL   BUN 11  6 - 23 mg/dL   Creatinine, Ser 2.13  0.50 - 1.35 mg/dL   Calcium 8.6  8.4 - 08.6 mg/dL   Total Protein 6.4  6.0 - 8.3 g/dL   Albumin 2.7 (*) 3.5 - 5.2 g/dL   AST  26  0 - 37 U/L   ALT 23  0 - 53 U/L   Alkaline Phosphatase 96  39 - 117 U/L   Total Bilirubin 0.8  0.3 - 1.2 mg/dL   GFR calc non Af Amer 80 (*) >90 mL/min   GFR calc Af Amer >90  >90 mL/min   Comment: (NOTE)     The eGFR has been calculated using the CKD EPI equation.     This calculation has not been validated in all clinical situations.     eGFR's persistently <90 mL/min signify possible Chronic Kidney     Disease.    Imaging / Studies: Ct Abdomen Pelvis W Contrast  08/25/2012   *RADIOLOGY REPORT*  Clinical Data: Right lower quadrant abdominal pain with nausea for 1 day with leukocytosis.  CT ABDOMEN AND PELVIS WITH CONTRAST  Technique:  Multidetector CT imaging of the abdomen and pelvis was performed following the standard protocol during bolus administration of intravenous contrast.  Contrast: OMNIPAQUE IOHEXOL 300 MG/ML  SOLN, 50mL OMNIPAQUE IOHEXOL 300 MG/ML  SOLN  Comparison: Prior CT 01/19/2003.  Findings: The lung bases are clear.  There is no pleural or  pericardial effusion.  The gallbladder is distended and contains small gallstones.  There is no gallbladder wall thickening or pericholecystic fluid.  The liver, biliary system, spleen and adrenal glands appear normal. The pancreas demonstrates chronic atrophy which appears stable. There is a small parenchymal calcification within the pancreas.  The right kidney appears normal.  The left kidney demonstrates marked chronic atrophy with cortical thinning and limited contrast excretion.  There are small cysts in the upper pole.  There is moderate circumferential wall thickening of the cecum with mild surrounding inflammatory change.  The terminal ileum appears normal.  The appendix is not well delineated.  However, the appendix was present previously and was retrocecal in location. There is a tubular soft tissue structure in this area which could reflect an inflamed appendix.  There is no evidence of bowel obstruction or extravasated enteric contrast.  Soft tissue stranding in the base of the mesentery is similar to the prior study.  There is no discrete adenopathy or extraluminal fluid collection.  Mild aorto iliac atherosclerosis appears stable. The bladder and seminal vesicles appear normal.  There are asymmetric calcifications within the right lobe of the prostate gland.  IMPRESSION:  1.  Abnormal examination with circumferential wall thickening of the cecum, surrounding inflammation and probable distension of the appendix.  This degree of cecal wall thickening would be atypical for early acute appendicitis, and I would favor focal colitis with possible secondary appendiceal inflammation. However, cecal neoplasm and atypical appendicitis cannot be completely excluded. 2.  No evidence of perforation, abscess or terminal ileal inflammation. 3.  Marked left renal atrophy, similar to prior examination.  4.  Cholelithiasis without evidence of cholecystitis.  These results were called by telephone on 08/25/2012 at 2030  hours to Dr. Elesa Massed, who verbally acknowledged these results.   Original Report Authenticated By: Carey Bullocks, M.D.    Medications / Allergies: per chart  Antibiotics: Anti-infectives   Start     Dose/Rate Route Frequency Ordered Stop   08/26/12 0600  piperacillin-tazobactam (ZOSYN) IVPB 3.375 g     3.375 g 12.5 mL/hr over 240 Minutes Intravenous 3 times per day 08/26/12 0001     08/25/12 2200  piperacillin-tazobactam (ZOSYN) IVPB 3.375 g     3.375 g 100 mL/hr over 30 Minutes Intravenous  Once 08/25/12 2123 08/25/12 2256  Assessment/Plan Abdominal pain Questionable colitis, early appendicitis or neoplasm.  We will continue with antibiotics, bowel rest and pain control. -WBC 16.3-->14.3 -IV hydration -transfer to floor with tele Hypertension -hold home bp medication -metoprolol with parameters VTE prophylaxis   -SCDs, mobilize, add lovenox CAD/CABG -followed by Dr. Nicki Guadalajara, I have called [facility] for a consult.   Ashok Norris, [facility] Surgery Pager [phone] Office [phone]  08/26/2012 7:47 AM  Pain 8/10 now down from 10/10 though his exam is not very tender.  He had acute onset of RLQ pain and white count down slightly.  HD stable and AF.  CT reviewed and there is concern for appendicitis vs. Colitis.  I had a long discussion with him about the options for dx laparoscopy/appendectomy vs. Continued observation and the pros and cons and risks of each.  I explained that in this case, appendectomy is probably the most "conservative" option to rule out appendicitis since we cannot rule this out completely otherwise.  He is focally tender in RLQ and this is certainly a concern.  We discussed the risks of surgery and the risks of continued observation including perforation and increase in complications.  At this point, he would like to wait another day to see if he is improving since he does feel a little better and wbc down.  We  will ask cardiology to evaluate but if no better tomorrow, then we will again need to consider dx laparoscopy.

## 2012-08-27 ENCOUNTER — Encounter (HOSPITAL_COMMUNITY): Payer: Self-pay | Admitting: Anesthesiology

## 2012-08-27 ENCOUNTER — Encounter (HOSPITAL_COMMUNITY): Admission: EM | Disposition: A | Discharge status: Home / Self Care | Payer: Self-pay | Source: Home / Self Care

## 2012-08-27 ENCOUNTER — Inpatient Hospital Stay (HOSPITAL_COMMUNITY): Payer: Medicare Other | Admitting: Anesthesiology

## 2012-08-27 ENCOUNTER — Encounter (HOSPITAL_COMMUNITY): Payer: Self-pay | Admitting: Certified Registered"

## 2012-08-27 DIAGNOSIS — D371 Neoplasm of uncertain behavior of stomach: Secondary | ICD-10-CM

## 2012-08-27 DIAGNOSIS — D375 Neoplasm of uncertain behavior of rectum: Secondary | ICD-10-CM

## 2012-08-27 DIAGNOSIS — D378 Neoplasm of uncertain behavior of other specified digestive organs: Secondary | ICD-10-CM

## 2012-08-27 DIAGNOSIS — K55059 Acute (reversible) ischemia of intestine, part and extent unspecified: Secondary | ICD-10-CM

## 2012-08-27 HISTORY — PX: COLON RESECTION: SHX5231

## 2012-08-27 LAB — CBC
HCT: 37.7 % — ABNORMAL LOW (ref 39.0–52.0)
MCH: 29 pg (ref 26.0–34.0)
MCHC: 31 g/dL (ref 30.0–36.0)
MCV: 93.5 fL (ref 78.0–100.0)
RDW: 14.5 % (ref 11.5–15.5)
WBC: 12.4 10*3/uL — ABNORMAL HIGH (ref 4.0–10.5)

## 2012-08-27 LAB — BASIC METABOLIC PANEL
BUN: 8 mg/dL (ref 6–23)
Chloride: 101 mEq/L (ref 96–112)
Creatinine, Ser: 0.99 mg/dL (ref 0.50–1.35)
GFR calc Af Amer: >90 mL/min (ref >90)

## 2012-08-27 LAB — MAGNESIUM: Magnesium: 2 mg/dL (ref 1.5–2.5)

## 2012-08-27 SURGERY — COLON RESECTION LAPAROSCOPIC
Anesthesia: General | Site: Abdomen | Wound class: Contaminated

## 2012-08-27 MED ORDER — ROCURONIUM BROMIDE 100 MG/10ML IV SOLN
INTRAVENOUS | Status: DC | PRN
  Administered 2012-08-27: 5 mg via INTRAVENOUS
  Administered 2012-08-27: 10 mg via INTRAVENOUS
  Administered 2012-08-27: 30 mg via INTRAVENOUS
  Administered 2012-08-27: 10 mg via INTRAVENOUS

## 2012-08-27 MED ORDER — 0.9 % SODIUM CHLORIDE (POUR BTL) OPTIME
TOPICAL | Status: DC | PRN
  Administered 2012-08-27: 2000 mL

## 2012-08-27 MED ORDER — HYDROMORPHONE HCL PF 1 MG/ML IJ SOLN
0.2500 mg | INTRAMUSCULAR | Status: DC | PRN
  Administered 2012-08-27: 0.25 mg via INTRAVENOUS
  Administered 2012-08-27: 0.5 mg via INTRAVENOUS

## 2012-08-27 MED ORDER — LACTATED RINGERS IV SOLN
INTRAVENOUS | Status: DC | PRN
  Administered 2012-08-27 (×2): via INTRAVENOUS

## 2012-08-27 MED ORDER — FENTANYL CITRATE 0.05 MG/ML IJ SOLN
INTRAMUSCULAR | Status: DC | PRN
  Administered 2012-08-27: 50 ug via INTRAVENOUS
  Administered 2012-08-27: 100 ug via INTRAVENOUS
  Administered 2012-08-27 (×2): 50 ug via INTRAVENOUS

## 2012-08-27 MED ORDER — SUCCINYLCHOLINE CHLORIDE 20 MG/ML IJ SOLN
INTRAMUSCULAR | Status: DC | PRN
  Administered 2012-08-27: 100 mg via INTRAVENOUS

## 2012-08-27 MED ORDER — HYDROMORPHONE HCL PF 1 MG/ML IJ SOLN
INTRAMUSCULAR | Status: DC | PRN
  Administered 2012-08-27 (×4): 0.5 mg via INTRAVENOUS

## 2012-08-27 MED ORDER — MIDAZOLAM HCL 5 MG/5ML IJ SOLN
INTRAMUSCULAR | Status: DC | PRN
  Administered 2012-08-27 (×2): 1 mg via INTRAVENOUS

## 2012-08-27 MED ORDER — PIPERACILLIN-TAZOBACTAM 3.375 G IVPB
3.3750 g | Freq: Three times a day (TID) | INTRAVENOUS | Status: AC
  Administered 2012-08-27 – 2012-08-28 (×3): 3.375 g via INTRAVENOUS
  Filled 2012-08-27 (×3): qty 50

## 2012-08-27 MED ORDER — PROMETHAZINE HCL 25 MG/ML IJ SOLN: 6.2500 mg | INTRAMUSCULAR | Status: DC | PRN

## 2012-08-27 MED ORDER — OXYCODONE HCL 5 MG PO TABS: 5.0000 mg | ORAL_TABLET | Freq: Once | ORAL | Status: DC | PRN

## 2012-08-27 MED ORDER — ONDANSETRON HCL 4 MG/2ML IJ SOLN
INTRAMUSCULAR | Status: DC | PRN
  Administered 2012-08-27: 4 mg via INTRAVENOUS

## 2012-08-27 MED ORDER — METOPROLOL TARTRATE 1 MG/ML IV SOLN
5.0000 mg | INTRAVENOUS | Status: AC | PRN
  Administered 2012-08-27 (×5): 5 mg via INTRAVENOUS

## 2012-08-27 MED ORDER — NEOSTIGMINE METHYLSULFATE 1 MG/ML IJ SOLN
INTRAMUSCULAR | Status: DC | PRN
  Administered 2012-08-27: 4 mg via INTRAVENOUS

## 2012-08-27 MED ORDER — METOPROLOL TARTRATE 1 MG/ML IV SOLN
INTRAVENOUS | Status: DC | PRN
  Administered 2012-08-27 (×5): 1 mg via INTRAVENOUS

## 2012-08-27 MED ORDER — LABETALOL HCL 5 MG/ML IV SOLN
INTRAVENOUS | Status: DC | PRN
  Administered 2012-08-27 (×3): 5 mg via INTRAVENOUS

## 2012-08-27 MED ORDER — LIDOCAINE HCL (PF) 2 % IJ SOLN
INTRAMUSCULAR | Status: DC | PRN
  Administered 2012-08-27: 20 mg

## 2012-08-27 MED ORDER — GLYCOPYRROLATE 0.2 MG/ML IJ SOLN
INTRAMUSCULAR | Status: DC | PRN
  Administered 2012-08-27: .6 mg via INTRAVENOUS

## 2012-08-27 MED ORDER — MEPERIDINE HCL 50 MG/ML IJ SOLN: 6.2500 mg | INTRAMUSCULAR | Status: DC | PRN

## 2012-08-27 MED ORDER — PROPOFOL 10 MG/ML IV BOLUS
INTRAVENOUS | Status: DC | PRN
  Administered 2012-08-27: 150 mg via INTRAVENOUS

## 2012-08-27 MED ORDER — OXYCODONE HCL 5 MG/5ML PO SOLN
5.0000 mg | Freq: Once | ORAL | Status: DC | PRN
  Filled 2012-08-27: qty 5

## 2012-08-27 MED ORDER — LACTATED RINGERS IR SOLN
Status: DC | PRN
  Administered 2012-08-27: 1

## 2012-08-27 SURGICAL SUPPLY — 78 items
APPLIER CLIP 5 13 M/L LIGAMAX5 (MISCELLANEOUS)
APPLIER CLIP ROT 10 11.4 M/L (STAPLE)
BLADE EXTENDED COATED 6.5IN (ELECTRODE) IMPLANT
BLADE HEX COATED 2.75 (ELECTRODE) ×4 IMPLANT
BLADE SURG SZ10 CARB STEEL (BLADE) ×2 IMPLANT
CANISTER SUCTION 2500CC (MISCELLANEOUS) ×2 IMPLANT
CANNULA ENDOPATH XCEL 11M (ENDOMECHANICALS) IMPLANT
CELLS DAT CNTRL 66122 CELL SVR (MISCELLANEOUS) IMPLANT
CHLORAPREP W/TINT 26ML (MISCELLANEOUS) ×4 IMPLANT
CLIP APPLIE 5 13 M/L LIGAMAX5 (MISCELLANEOUS) IMPLANT
CLIP APPLIE ROT 10 11.4 M/L (STAPLE) IMPLANT
CLOTH BEACON ORANGE TIMEOUT ST (SAFETY) ×2 IMPLANT
COVER MAYO STAND STRL (DRAPES) ×2 IMPLANT
DECANTER SPIKE VIAL GLASS SM (MISCELLANEOUS) ×2 IMPLANT
DERMABOND ADVANCED (GAUZE/BANDAGES/DRESSINGS)
DERMABOND ADVANCED .7 DNX12 (GAUZE/BANDAGES/DRESSINGS) IMPLANT
DRAPE LAPAROSCOPIC ABDOMINAL (DRAPES) ×2 IMPLANT
DRAPE LG THREE QUARTER DISP (DRAPES) IMPLANT
DRAPE UTILITY XL STRL (DRAPES) ×4 IMPLANT
DRAPE WARM FLUID 44X44 (DRAPE) ×2 IMPLANT
DRSG OPSITE POSTOP 4X10 (GAUZE/BANDAGES/DRESSINGS) ×2 IMPLANT
DRSG TEGADERM 2-3/8X2-3/4 SM (GAUZE/BANDAGES/DRESSINGS) ×2 IMPLANT
ELECT REM PT RETURN 9FT ADLT (ELECTROSURGICAL) ×2
ELECTRODE REM PT RTRN 9FT ADLT (ELECTROSURGICAL) ×1 IMPLANT
FILTER SMOKE EVAC LAPAROSHD (FILTER) IMPLANT
GAUZE SPONGE 2X2 8PLY STRL LF (GAUZE/BANDAGES/DRESSINGS) ×1 IMPLANT
GLOVE BIOGEL M 7.0 STRL (GLOVE) IMPLANT
GLOVE BIOGEL PI IND STRL 7.0 (GLOVE) ×1 IMPLANT
GLOVE BIOGEL PI INDICATOR 7.0 (GLOVE) ×1
GLOVE SURG SS PI 7.5 STRL IVOR (GLOVE) ×4 IMPLANT
GOWN PREVENTION PLUS LG XLONG (DISPOSABLE) IMPLANT
GOWN STRL NON-REIN LRG LVL3 (GOWN DISPOSABLE) IMPLANT
GOWN STRL REIN XL XLG (GOWN DISPOSABLE) ×12 IMPLANT
KIT BASIN OR (CUSTOM PROCEDURE TRAY) ×2 IMPLANT
LEGGING LITHOTOMY PAIR STRL (DRAPES) IMPLANT
LIGASURE IMPACT 36 18CM CVD LR (INSTRUMENTS) ×2 IMPLANT
NS IRRIG 1000ML POUR BTL (IV SOLUTION) ×2 IMPLANT
PENCIL BUTTON HOLSTER BLD 10FT (ELECTRODE) ×4 IMPLANT
POUCH SPECIMEN RETRIEVAL 10MM (ENDOMECHANICALS) IMPLANT
RELOAD PROXIMATE 75MM BLUE (ENDOMECHANICALS) ×4 IMPLANT
RTRCTR WOUND ALEXIS 18CM MED (MISCELLANEOUS)
SCALPEL HARMONIC ACE (MISCELLANEOUS) IMPLANT
SET IRRIG TUBING LAPAROSCOPIC (IRRIGATION / IRRIGATOR) ×2 IMPLANT
SOLUTION ANTI FOG 6CC (MISCELLANEOUS) ×2 IMPLANT
SPONGE GAUZE 2X2 STER 10/PKG (GAUZE/BANDAGES/DRESSINGS) ×1
SPONGE GAUZE 4X4 12PLY (GAUZE/BANDAGES/DRESSINGS) IMPLANT
SPONGE LAP 18X18 X RAY DECT (DISPOSABLE) ×6 IMPLANT
STAPLER GUN LINEAR PROX 60 (STAPLE) ×2 IMPLANT
STAPLER PROXIMATE 75MM BLUE (STAPLE) ×2 IMPLANT
STAPLER VISISTAT 35W (STAPLE) ×2 IMPLANT
STRIP CLOSURE SKIN 1/2X4 (GAUZE/BANDAGES/DRESSINGS) IMPLANT
SUCTION POOLE TIP (SUCTIONS) ×2 IMPLANT
SUT PDS AB 1 CTX 36 (SUTURE) ×4 IMPLANT
SUT PDS AB 1 TP1 96 (SUTURE) IMPLANT
SUT PROLENE 2 0 KS (SUTURE) IMPLANT
SUT PROLENE 2 0 SH DA (SUTURE) IMPLANT
SUT SILK 2 0 (SUTURE) ×1
SUT SILK 2 0 SH (SUTURE) ×2 IMPLANT
SUT SILK 2 0 SH CR/8 (SUTURE) ×4 IMPLANT
SUT SILK 2-0 18XBRD TIE 12 (SUTURE) ×1 IMPLANT
SUT SILK 3 0 (SUTURE)
SUT SILK 3 0 SH CR/8 (SUTURE) ×2 IMPLANT
SUT SILK 3-0 18XBRD TIE 12 (SUTURE) IMPLANT
SYR BULB IRRIGATION 50ML (SYRINGE) ×2 IMPLANT
SYS LAPSCP GELPORT 120MM (MISCELLANEOUS) ×2
SYSTEM LAPSCP GELPORT 120MM (MISCELLANEOUS) ×1 IMPLANT
TOWEL OR 17X26 10 PK STRL BLUE (TOWEL DISPOSABLE) ×2 IMPLANT
TOWEL OR NON WOVEN STRL DISP B (DISPOSABLE) ×2 IMPLANT
TRAY FOLEY CATH 14FRSI W/METER (CATHETERS) ×2 IMPLANT
TRAY LAP CHOLE (CUSTOM PROCEDURE TRAY) ×2 IMPLANT
TROCAR BLADELESS OPT 5 75 (ENDOMECHANICALS) ×6 IMPLANT
TROCAR XCEL 12X100 BLDLESS (ENDOMECHANICALS) IMPLANT
TROCAR XCEL BLUNT TIP 100MML (ENDOMECHANICALS) ×2 IMPLANT
TROCAR XCEL NON-BLD 11X100MML (ENDOMECHANICALS) IMPLANT
TUBING FILTER THERMOFLATOR (ELECTROSURGICAL) IMPLANT
TUBING INSUFFLATION 10FT LAP (TUBING) ×2 IMPLANT
YANKAUER SUCT BULB TIP 10FT TU (MISCELLANEOUS) ×4 IMPLANT
YANKAUER SUCT BULB TIP NO VENT (SUCTIONS) IMPLANT

## 2012-08-27 NOTE — Progress Notes (Signed)
Pt had 9 beats of V. Tach on heart monitor. Pt asymptomatic, resting in bed, states he feels fine. VSS 121 /57, 76, 98.6, 20. Notified on call for Cardiology, Dr. Adolm Joseph. New order to add Magnesium to am labs. Telemetry strip placed in chart will continue to monitor. [name]

## 2012-08-27 NOTE — Progress Notes (Signed)
Patient ID: Joseph Bryan, male   DOB: 10-12-1940, 72 y.o.   MRN: [id number]  Subjective: Pt not feeling much better today, continues to have focal RLQ pain.  Denies diarrhea, n/v.  Denies f/c/s.  Denies chest pains or shortness of breath.   Objective:  Vital signs:  Filed Vitals:   08/26/12 1349 08/26/12 2239 08/27/12 0428 08/27/12 0547  BP: 147/53 120/63 121/57 118/69  Pulse: 91 83 76 77  Temp: 98.6 F (37 C) 98.8 F (37.1 C) 98.6 F (37 C) 98.4 F (36.9 C)  TempSrc:  Oral Oral Oral  Resp: 18 18 18 16   Height:      Weight:      SpO2: 96% 94%  93%    Last BM Date: 08/26/12  Intake/Output   Yesterday:  08/14 0701 - 08/15 0700 In: 850 [I.V.:800; IV Piggyback:50] Out: 800 [Urine:800]  Physical Exam: General: Pt awake/alert/oriented x3 and in no acute distress  Chest: CTA No chest wall pain w good excursion  CV: Pulses intact. Regular rhythm. Trace pedal edema.  Abdomen: Soft. Obese. Tender to RLQ.  No evidence of peritonitis. No incarcerated hernias.  Ext: SCDs BLE. No mjr edema. No cyanosis  Skin: No petechiae / purpura  Problem List:   Active Problems:   Abdominal pain, acute, right lower quadrant   HTN (hypertension)   Obesity, unspecified   CAD- s/p CABG in 1998 - patent grafts on cath in 2010   Results:   Labs: Results for orders placed during the hospital encounter of 08/25/12 (from the past 48 hour(s))  URINALYSIS, ROUTINE W REFLEX MICROSCOPIC     Status: Abnormal   Collection Time    08/25/12  5:26 PM      Result Value Range   Color, Urine YELLOW  YELLOW   APPearance CLEAR  CLEAR   Specific Gravity, Urine 1.018  1.005 - 1.030   pH 6.0  5.0 - 8.0   Glucose, UA NEGATIVE  NEGATIVE mg/dL   Hgb urine dipstick NEGATIVE  NEGATIVE   Bilirubin Urine NEGATIVE  NEGATIVE   Ketones, ur NEGATIVE  NEGATIVE mg/dL   Protein, ur NEGATIVE  NEGATIVE mg/dL   Urobilinogen, UA 0.2  0.0 - 1.0 mg/dL   Nitrite NEGATIVE  NEGATIVE   Leukocytes, UA SMALL (*) NEGATIVE   URINE MICROSCOPIC-ADD ON     Status: None   Collection Time    08/25/12  5:26 PM      Result Value Range   WBC, UA 0-2  <3 WBC/hpf  CBC WITH DIFFERENTIAL     Status: Abnormal   Collection Time    08/25/12  6:14 PM      Result Value Range   WBC 16.3 (*) 4.0 - 10.5 K/uL   RBC 4.68  4.22 - 5.81 MIL/uL   Hemoglobin 13.8  13.0 - 17.0 g/dL   HCT 40.9  81.1 - 91.4 %   MCV 91.7  78.0 - 100.0 fL   MCH 29.5  26.0 - 34.0 pg   MCHC 32.2  30.0 - 36.0 g/dL   RDW 78.2  95.6 - 21.3 %   Platelets 224  150 - 400 K/uL   Neutrophils Relative % 85 (*) 43 - 77 %   Neutro Abs 13.8 (*) 1.7 - 7.7 K/uL   Lymphocytes Relative 8 (*) 12 - 46 %   Lymphs Abs 1.4  0.7 - 4.0 K/uL   Monocytes Relative 7  3 - 12 %   Monocytes Absolute 1.1 (*) 0.1 -  1.0 K/uL   Eosinophils Relative 0  0 - 5 %   Eosinophils Absolute 0.0  0.0 - 0.7 K/uL   Basophils Relative 0  0 - 1 %   Basophils Absolute 0.0  0.0 - 0.1 K/uL  COMPREHENSIVE METABOLIC PANEL     Status: Abnormal   Collection Time    08/25/12  6:14 PM      Result Value Range   Sodium 131 (*) 135 - 145 mEq/L   Potassium 3.9  3.5 - 5.1 mEq/L   Chloride 94 (*) 96 - 112 mEq/L   CO2 30  19 - 32 mEq/L   Glucose, Bld 117 (*) 70 - 99 mg/dL   BUN 14  6 - 23 mg/dL   Creatinine, Ser 1.61  0.50 - 1.35 mg/dL   Calcium 9.7  8.4 - 09.6 mg/dL   Total Protein 7.8  6.0 - 8.3 g/dL   Albumin 3.6  3.5 - 5.2 g/dL   AST 26  0 - 37 U/L   ALT 29  0 - 53 U/L   Alkaline Phosphatase 121 (*) 39 - 117 U/L   Total Bilirubin 0.7  0.3 - 1.2 mg/dL   GFR calc non Af Amer 82 (*) >90 mL/min   GFR calc Af Amer >90  >90 mL/min   Comment: (NOTE)     The eGFR has been calculated using the CKD EPI equation.     This calculation has not been validated in all clinical situations.     eGFR's persistently <90 mL/min signify possible Chronic Kidney     Disease.  MRSA PCR SCREENING     Status: Abnormal   Collection Time    08/26/12 12:03 AM      Result Value Range   MRSA by PCR POSITIVE (*)  NEGATIVE   Comment:            The GeneXpert MRSA Assay (FDA     approved for NASAL specimens     only), is one component of a     comprehensive MRSA colonization     surveillance program. It is not     intended to diagnose MRSA     infection nor to guide or     monitor treatment for     MRSA infections.     RESULT CALLED TO, READ BACK BY AND VERIFIED WITH:     H JOHNSON AT 0341 ON 08.14.2014 BY NBROOKS  CBC     Status: Abnormal   Collection Time    08/26/12  4:08 AM      Result Value Range   WBC 14.3 (*) 4.0 - 10.5 K/uL   RBC 4.20 (*) 4.22 - 5.81 MIL/uL   Hemoglobin 12.1 (*) 13.0 - 17.0 g/dL   HCT 04.5  40.9 - 81.1 %   MCV 92.9  78.0 - 100.0 fL   MCH 28.8  26.0 - 34.0 pg   MCHC 31.0  30.0 - 36.0 g/dL   RDW 91.4  78.2 - 95.6 %   Platelets 183  150 - 400 K/uL  COMPREHENSIVE METABOLIC PANEL     Status: Abnormal   Collection Time    08/26/12  4:08 AM      Result Value Range   Sodium 133 (*) 135 - 145 mEq/L   Potassium 4.3  3.5 - 5.1 mEq/L   Chloride 100  96 - 112 mEq/L   CO2 29  19 - 32 mEq/L   Glucose, Bld 130 (*) 70 - 99 mg/dL  BUN 11  6 - 23 mg/dL   Creatinine, Ser 1.61  0.50 - 1.35 mg/dL   Calcium 8.6  8.4 - 09.6 mg/dL   Total Protein 6.4  6.0 - 8.3 g/dL   Albumin 2.7 (*) 3.5 - 5.2 g/dL   AST 26  0 - 37 U/L   ALT 23  0 - 53 U/L   Alkaline Phosphatase 96  39 - 117 U/L   Total Bilirubin 0.8  0.3 - 1.2 mg/dL   GFR calc non Af Amer 80 (*) >90 mL/min   GFR calc Af Amer >90  >90 mL/min   Comment: (NOTE)     The eGFR has been calculated using the CKD EPI equation.     This calculation has not been validated in all clinical situations.     eGFR's persistently <90 mL/min signify possible Chronic Kidney     Disease.  CEA     Status: None   Collection Time    08/26/12  4:08 AM      Result Value Range   CEA <0.5  0.0 - 5.0 ng/mL   Comment: Performed at [facility]  CBC     Status: Abnormal   Collection Time    08/27/12  4:55 AM      Result Value Range   WBC  12.4 (*) 4.0 - 10.5 K/uL   RBC 4.03 (*) 4.22 - 5.81 MIL/uL   Hemoglobin 11.7 (*) 13.0 - 17.0 g/dL   HCT 04.5 (*) 40.9 - 81.1 %   MCV 93.5  78.0 - 100.0 fL   MCH 29.0  26.0 - 34.0 pg   MCHC 31.0  30.0 - 36.0 g/dL   RDW 91.4  78.2 - 95.6 %   Platelets 192  150 - 400 K/uL  BASIC METABOLIC PANEL     Status: Abnormal   Collection Time    08/27/12  4:55 AM      Result Value Range   Sodium 135  135 - 145 mEq/L   Potassium 3.6  3.5 - 5.1 mEq/L   Chloride 101  96 - 112 mEq/L   CO2 27  19 - 32 mEq/L   Glucose, Bld 132 (*) 70 - 99 mg/dL   BUN 8  6 - 23 mg/dL   Creatinine, Ser 2.13  0.50 - 1.35 mg/dL   Calcium 8.8  8.4 - 08.6 mg/dL   GFR calc non Af Amer 80 (*) >90 mL/min   GFR calc Af Amer >90  >90 mL/min   Comment: (NOTE)     The eGFR has been calculated using the CKD EPI equation.     This calculation has not been validated in all clinical situations.     eGFR's persistently <90 mL/min signify possible Chronic Kidney     Disease.  MAGNESIUM     Status: None   Collection Time    08/27/12  4:55 AM      Result Value Range   Magnesium 2.0  1.5 - 2.5 mg/dL    Imaging / Studies: Ct Abdomen Pelvis W Contrast  08/25/2012   *RADIOLOGY REPORT*  Clinical Data: Right lower quadrant abdominal pain with nausea for 1 day with leukocytosis.  CT ABDOMEN AND PELVIS WITH CONTRAST  Technique:  Multidetector CT imaging of the abdomen and pelvis was performed following the standard protocol during bolus administration of intravenous contrast.  Contrast: OMNIPAQUE IOHEXOL 300 MG/ML  SOLN, 50mL OMNIPAQUE IOHEXOL 300 MG/ML  SOLN  Comparison: Prior CT 01/19/2003.  Findings:  The lung bases are clear.  There is no pleural or pericardial effusion.  The gallbladder is distended and contains small gallstones.  There is no gallbladder wall thickening or pericholecystic fluid.  The liver, biliary system, spleen and adrenal glands appear normal. The pancreas demonstrates chronic atrophy which appears stable. There  is a small parenchymal calcification within the pancreas.  The right kidney appears normal.  The left kidney demonstrates marked chronic atrophy with cortical thinning and limited contrast excretion.  There are small cysts in the upper pole.  There is moderate circumferential wall thickening of the cecum with mild surrounding inflammatory change.  The terminal ileum appears normal.  The appendix is not well delineated.  However, the appendix was present previously and was retrocecal in location. There is a tubular soft tissue structure in this area which could reflect an inflamed appendix.  There is no evidence of bowel obstruction or extravasated enteric contrast.  Soft tissue stranding in the base of the mesentery is similar to the prior study.  There is no discrete adenopathy or extraluminal fluid collection.  Mild aorto iliac atherosclerosis appears stable. The bladder and seminal vesicles appear normal.  There are asymmetric calcifications within the right lobe of the prostate gland.  IMPRESSION:  1.  Abnormal examination with circumferential wall thickening of the cecum, surrounding inflammation and probable distension of the appendix.  This degree of cecal wall thickening would be atypical for early acute appendicitis, and I would favor focal colitis with possible secondary appendiceal inflammation. However, cecal neoplasm and atypical appendicitis cannot be completely excluded. 2.  No evidence of perforation, abscess or terminal ileal inflammation. 3.  Marked left renal atrophy, similar to prior examination.  4.  Cholelithiasis without evidence of cholecystitis.  These results were called by telephone on 08/25/2012 at 2030 hours to Dr. Elesa Massed, who verbally acknowledged these results.   Original Report Authenticated By: Carey Bullocks, M.D.    Medications / Allergies: per chart  Antibiotics: Anti-infectives   Start     Dose/Rate Route Frequency Ordered Stop   08/26/12 0600  piperacillin-tazobactam  (ZOSYN) IVPB 3.375 g     3.375 g 12.5 mL/hr over 240 Minutes Intravenous 3 times per day 08/26/12 0001     08/25/12 2200  piperacillin-tazobactam (ZOSYN) IVPB 3.375 g     3.375 g 100 mL/hr over 30 Minutes Intravenous  Once 08/25/12 2123 08/25/12 2256      Assessment/Plan        Assessment/Plan  Abdominal pain  Questionable colitis, early appendicitis or neoplasm.  -WBC is trending down, he has not had much improvement with bowel rest and antimicrobial therapy.  His exam is consistent more with acute appendicitis.  I will discuss with my attending regarding surgical intervention. -IV hydration  -transfer to floor with tele  Hypertension  -hold home bp medication  -metoprolol with parameters  VTE prophylaxis  -SCDs, mobilize, add lovenox  CAD/CABG  -appreciate cardiology consult -pt has been cleared for surgery   Ashok Norris, [facility] Pager [phone] Office [phone]  08/27/2012  Not feeling any better today.  Still focally tender in RLQ.  Wbc down but I still cannot rule out appendicitis.  I again discussed with him the risks and the options.  Repeat ct vs. Surgery vs. Observation and the pros/cons of each.  He would like to proceed with dx laparoscopy to r/o appendicitis.  Risks of infection, bleeding, pain, scarring, persistent symptoms, negative exam, need for open, or need for colectomy, and injury to  surrounding structures discussed and he expressed understanding and desires to proceed with dx laparoscopy, appendectomy

## 2012-08-27 NOTE — Anesthesia Preprocedure Evaluation (Addendum)
Anesthesia Evaluation  Patient identified by MRN, date of birth, ID band Patient awake    Reviewed: Allergy & Precautions, H&P , NPO status , Patient's Chart, lab work & pertinent test results, reviewed documented beta blocker date and time   Airway Mallampati: II TM Distance: >3 FB Neck ROM: Full    Dental  (+) Dental Advisory Given, Edentulous Upper and Edentulous Lower   Pulmonary neg pulmonary ROS,  breath sounds clear to auscultation        Cardiovascular hypertension, Pt. on medications and Pt. on home beta blockers + CAD, + Past MI and + CABG Rhythm:Regular Rate:Normal     Neuro/Psych negative neurological ROS  negative psych ROS   GI/Hepatic negative GI ROS, Neg liver ROS,   Endo/Other  Morbid obesity  Renal/GU negative Renal ROS     Musculoskeletal negative musculoskeletal ROS (+)   Abdominal (+) + obese,   Peds  Hematology negative hematology ROS (+)   Anesthesia Other Findings   Reproductive/Obstetrics                          Anesthesia Physical Anesthesia Plan  ASA: III  Anesthesia Plan: General   Post-op Pain Management:    Induction: Intravenous  Airway Management Planned: Oral ETT  Additional Equipment:   Intra-op Plan:   Post-operative Plan: Extubation in OR  Informed Consent: I have reviewed the patients History and Physical, chart, labs and discussed the procedure including the risks, benefits and alternatives for the proposed anesthesia with the patient or authorized representative who has indicated his/her understanding and acceptance.   Dental advisory given  Plan Discussed with: CRNA  Anesthesia Plan Comments:         Anesthesia Quick Evaluation

## 2012-08-27 NOTE — Brief Op Note (Signed)
08/25/2012 - 08/27/2012  4:25 PM  PATIENT:  Joseph Bryan  72 y.o. male  PRE-OPERATIVE DIAGNOSIS:  obstruction  POST-OPERATIVE DIAGNOSIS:  obstruction  PROCEDURE:  Procedure(s): Diagnostic laparoscopy; Ileocecectomy (N/A)  SURGEON:  Surgeon(s) and Role:    * Lodema Pilot, DO - Primary  PHYSICIAN ASSISTANT:   ASSISTANTS: none   ANESTHESIA:   general  EBL:  Total I/O In: 1700 [I.V.:1700] Out: 425 [Urine:275; Blood:150]  BLOOD ADMINISTERED:none  DRAINS: none   LOCAL MEDICATIONS USED:  NONE  SPECIMEN:  Source of Specimen:  ileocectomy for ischemia  DISPOSITION OF SPECIMEN:  PATHOLOGY  COUNTS:  YES  TOURNIQUET:  * No tourniquets in log *  DICTATION: .Other Dictation: Dictation Number [id number]  PLAN OF CARE: Admit to inpatient   PATIENT DISPOSITION:  PACU - hemodynamically stable.   Delay start of Pharmacological VTE agent (>24hrs) due to surgical blood loss or risk of bleeding: no

## 2012-08-27 NOTE — Preoperative (Signed)
Beta Blockers   Reason not to administer Beta Blockers:Not Applicable 

## 2012-08-27 NOTE — Transfer of Care (Signed)
Immediate Anesthesia Transfer of Care Note  Patient: Joseph Bryan  Procedure(s) Performed: Procedure(s): Diagnostic laparoscopy; Ileocecectomy (N/A)  Patient Location: PACU  Anesthesia Type:General  Level of Consciousness: awake, alert  and oriented  Airway & Oxygen Therapy: Patient Spontanous Breathing and Patient connected to face mask oxygen  Post-op Assessment: Report given to PACU RN and Post -op Vital signs reviewed and stable  Post vital signs: Reviewed and stable  Complications: No apparent anesthesia complications

## 2012-08-27 NOTE — Op Note (Signed)
NAMEMarland Kitchen  JAMARIOUS, FEBO NO.:  [id number]  MEDICAL RECORD NO.:  [id number]  LOCATION:  1439                         FACILITY:  [facility]  PHYSICIAN:  Lodema Pilot, MD       DATE OF BIRTH:  08/13/1940  DATE OF PROCEDURE:  08/27/2012 DATE OF DISCHARGE:                              OPERATIVE REPORT   PREOPERATIVE DIAGNOSIS:  Appendicitis.  POSTOPERATIVE DIAGNOSIS:  Colonic ischemia.  PROCEDURE:  Diagnostic laparoscopy with laparoscopic ileocecectomy.  FLUIDS:  1600 mL of crystalloid.  ESTIMATED BLOOD LOSS:  150 mL.  DRAINS:  None.  SPECIMEN:  Ileocecectomy sent to Pathology for permanent section.  COMPLICATIONS:  None apparent.  FINDINGS:  Ischemic patch of cecum without evidence of free perforation, tiny nondilated appendix, otherwise normal exam; side-to-side stapled anastomosis.  INDICATION FOR PROCEDURE:  Mr. Zahner is a 72 year old male with a 2-day history of right lower quadrant pain.  He had a CT scan concerning for colitis with cecal thickening, but appendix was never visualized well. His white count has been coming down on antibiotics, but his pain persisted, so we decided to perform diagnostic laparoscopy to rule out appendicitis.  OPERATIVE DETAILS:  Mr. Trulson was seen and evaluated in the surgical ward and risks and benefits of procedure were discussed in lay terms and informed consent was obtained.  He was already on therapeutic antibiotics and he was taken to the operating room, placed on table in supine position.  General endotracheal tube anesthesia was obtained and Foley catheter was placed.  His abdomen was prepped and draped in the standard surgical fashion and procedure time-out was performed with all operative team members confirm proper patient, procedure, and the supraumbilical midline incision was made in the skin and dissection was carried down to the subcutaneous tissue using blunt dissection.  The abdominal wall fascia was  elevated and sharply incised.  The peritoneum entered.  A 12-mm balloon port was placed and pneumoperitoneum was obtained.  Laparoscope was introduced.  There was no evidence of bleeding or bowel injury upon entry.  A 5 mm left lower quadrant port was placed and I immediately noticed that he had omentum and cecum stuck to the abdominal wall.  This was taken down bluntly and it was obvious he had inflammatory changes with fibrinous exudate overlying the cecum and terminal ileum.  I placed another 5 mm port in the suprapubic region, taking care to avoid injury to the bladder and this allowed me to mobilize the small bowel from off of the cecum which had adhered to the area of the cecum as well as the omentum.  I took this off  bluntly and after doing this, I was able to see an ischemic patch of cecum, however, this was nonperforated.  I decided to perform ileocecectomy at this time.  I placed another 5 mm port in the upper midline and mobilized the terminal ileum and cecum along the white line of Toldt, using sharp dissection and a small amount of Bovie electrocautery and mobilize this up to the hepatic flexure.  I enlarge the periumbilical incision to accommodate placement of a GelPort and wound retractor.  I placed my hand in the abdomen  and further mobilized the colon using blunt dissection.  Because of his intra-abdominal fat and mildly dilated loops of intestine, the visualization was difficult to perform any further mobilization so at this point, I worked through my midline incision where the GelPort was placed.  I brought the terminal ileum and cecum through wound and created a window through the mesentery of the terminal ileum and divided the terminal ileum with GIA 75 blue load stapler.  I scored the mesentery and then using ligature divided the mesentery under the cecum.  I selected a portion of healthy bowel on the ascending colon and also created window through this mesentery  and divided this section of colon with another firing of the GIA 75 stapler. I continued undermine the mesentery using ligature and for the vascular pedicle I used some 2-0 silk sutures for added security.  The specimen was then removed and sent to Pathology for permanent sectioning.  I then performed a side-to-side stapled anastomosis with GIA 75 stapler on the antimesenteric surface of each of the ascending colon and the terminal ileum.  A common enterotomy was then closed with a TA60 stapler and the staple line was oversewn with 2-0 silk Lembert sutures, and a crotch stitch was placed to take tension off the anastomosis.  The mesentery was approximated with 2-0 silk suture and the bowel was replaced back in the abdomen and the abdomen was irrigated with sterile saline solution. I then approximated the fascia at the extraction site using a #1 PDS suture x2 and I replaced the camera back in the abdomen and inspected the abdominal wall closure, which was noted to be adequate.  The bowel appeared to be lying appropriately without any evidence of twisting of the mesentery or tension on the anastomosis and suctioned any remaining fluid from the abdomen.  The abdomen appeared hemostatic without any evidence of bleeding or bowel injury and all of our equipment was exchanged for clean instruments.  We irrigated the wound and the wound was noted to be hemostatic.  The skin edges were approximated with skin staples.  The NG tube was placed and a Foley catheter was left in place. All sponge, needle, and instrument counts were correct at end of the case.  The patient tolerated procedure well without apparent complications.          ______________________________ Lodema Pilot, MD     BL/MEDQ  D:  08/27/2012  T:  08/27/2012  Job:  [id number]

## 2012-08-27 NOTE — Anesthesia Postprocedure Evaluation (Signed)
Anesthesia Post Note  Patient: Joseph Bryan  Procedure(s) Performed: Procedure(s) (LRB): Diagnostic laparoscopy; Ileocecectomy (N/A)  Anesthesia type: General  Patient location: PACU  Post pain: Pain level controlled  Post assessment: Post-op Vital signs reviewed  Last Vitals: BP 156/80  Pulse 80  Temp(Src) 36.7 C (Oral)  Resp 18  Ht 6\' 1"  (1.854 m)  Wt 297 lb 9.9 oz (135 kg)  BMI 39.27 kg/m2  SpO2 95%  Post vital signs: Reviewed  Level of consciousness: sedated  Complications: No apparent anesthesia complications

## 2012-08-28 DIAGNOSIS — I498 Other specified cardiac arrhythmias: Secondary | ICD-10-CM

## 2012-08-28 MED ORDER — METOPROLOL TARTRATE 1 MG/ML IV SOLN
5.0000 mg | INTRAVENOUS | Status: DC
  Administered 2012-08-28 – 2012-09-01 (×25): 5 mg via INTRAVENOUS
  Filled 2012-08-28 (×34): qty 5

## 2012-08-28 MED ORDER — ENOXAPARIN SODIUM 40 MG/0.4ML ~~LOC~~ SOLN
40.0000 mg | SUBCUTANEOUS | Status: DC
  Administered 2012-08-28 – 2012-09-05 (×9): 40 mg via SUBCUTANEOUS
  Filled 2012-08-28 (×9): qty 0.4

## 2012-08-28 NOTE — Progress Notes (Signed)
Pt with increased HR this am up to 140's. md called and stated to give lopressor now and change lopressor to q 4 hours. Also ordered ekg. Pt with family at bedside and made aware of orders. After giving lopressor hr down to 120's. Will continue to monitor

## 2012-08-28 NOTE — Progress Notes (Signed)
General Surgery Note  LOS: 3 days  POD -   1 Day Post-Op  Assessment/Plan: 1.  Diagnostic laparoscopy; Ileocecectomy - 08/27/2012 - [facility]  For Cecal ischemia  On Zosyn  To remove NGT, but keep NPO   2.  HTN 3.  CAD  CABG in 1998  Tachycardia post op - controlled with lopressor  Followed by Dr. Tresa Endo  4.  DVT prophylaxis - none, will start Lovenox 5.  Obesity, morbid. 6.  On isolation for MRSA. 7.  To remove foley  Subjective:  Doing well.  No specific complaint.  No BM or flatus.  Daughter, Jacki Cones, in room. Objective:   Filed Vitals:   08/28/12 0441  BP: 130/82  Pulse: 126  Temp:   Resp:      Intake/Output from previous day:  08/15 0701 - 08/16 0700 In: 3016 [I.V.:2956; NG/GT:10; IV Piggyback:50] Out: 1675 [Urine:1475; Emesis/NG output:50; Blood:150]  Intake/Output this shift:      Physical Exam:   General: Obese older M who is alert and oriented.    HEENT: Normal. Pupils equal. .   Lungs: Clear.  IS - 2,000 cc   Abdomen: Large.  Quiet. Dressing intact.   Wound: Looks okay.   Urologic:  Has foley.   Lab Results:    Recent Labs  08/26/12 0408 08/27/12 0455  WBC 14.3* 12.4*  HGB 12.1* 11.7*  HCT 39.0 37.7*  PLT 183 192    BMET   Recent Labs  08/26/12 0408 08/27/12 0455  NA 133* 135  K 4.3 3.6  CL 100 101  CO2 29 27  GLUCOSE 130* 132*  BUN 11 8  CREATININE 0.98 0.99  CALCIUM 8.6 8.8    PT/INR  No results found for this basename: LABPROT, INR,  in the last 72 hours  ABG  No results found for this basename: PHART, PCO2, PO2, HCO3,  in the last 72 hours   Studies/Results:  No results found.   Anti-infectives:   Anti-infectives   Start     Dose/Rate Route Frequency Ordered Stop   08/27/12 2200  piperacillin-tazobactam (ZOSYN) IVPB 3.375 g     3.375 g 12.5 mL/hr over 240 Minutes Intravenous 3 times per day 08/27/12 1804 08/28/12 2159   08/26/12 0600  piperacillin-tazobactam (ZOSYN) IVPB 3.375 g  Status:  Discontinued     3.375  g 12.5 mL/hr over 240 Minutes Intravenous 3 times per day 08/26/12 0001 08/27/12 1804   08/25/12 2200  piperacillin-tazobactam (ZOSYN) IVPB 3.375 g     3.375 g 100 mL/hr over 30 Minutes Intravenous  Once 08/25/12 2123 08/25/12 2256      Ovidio Kin, MD, FACS Pager: [phone],   [facility] Office: [phone] 08/28/2012

## 2012-08-28 NOTE — Progress Notes (Signed)
MD gerkin also informed that pt's gtube output decreased only about 10-15 cc output. md stated they would be in this am to assess pt status.

## 2012-08-28 NOTE — Progress Notes (Signed)
[facility] Progress Note  Subjective:  Day 1 s/p laparoscopy; Ileocecectomy.  No chest pain or sob.   Objective:   Vital Signs in the last 24 hours: Temp:  [98 F (36.7 C)-98.1 F (36.7 C)] 98.1 F (36.7 C) (08/16 0430) Pulse Rate:  [74-140] 126 (08/16 0441) Resp:  [14-20] 16 (08/15 2007) BP: (130-175)/(80-102) 130/82 mmHg (08/16 0441) SpO2:  [95 %-100 %] 98 % (08/16 0441)  Intake/Output from previous day: 08/15 0701 - 08/16 0700 In: 3016 [I.V.:2956; NG/GT:10; IV Piggyback:50] Out: 1675 [Urine:1475; Emesis/NG output:50; Blood:150]  Scheduled: . antiseptic oral rinse  15 mL Mouth Rinse BID  . Chlorhexidine Gluconate Cloth  6 each Topical Q0600  . metoprolol  5 mg Intravenous Q4H  . mupirocin ointment  1 application Nasal BID  . pantoprazole (PROTONIX) IV  40 mg Intravenous QHS  . piperacillin-tazobactam (ZOSYN)  IV  3.375 g Intravenous Q8H   . dextrose 5 % and 0.9 % NaCl with KCl 20 mEq/L 100 mL/hr at 08/28/12 0427   Physical Exam:   General appearance: alert, cooperative and no distress Neck: no carotid bruit, no JVD and supple, symmetrical, trachea midline Lungs: clear to auscultation bilaterally Heart: tachycardic at 110 - 120, regular, 1/6 sem Abdomen: BS soft. Surgical site without erythema Extremities: trivial edema   Rate: 120  Rhythm: sinus tachycardia  Lab Results:    Recent Labs  08/26/12 0408 08/27/12 0455  NA 133* 135  K 4.3 3.6  CL 100 101  CO2 29 27  GLUCOSE 130* 132*  BUN 11 8  CREATININE 0.98 0.99   No results found for this basename: TROPONINI, CK, MB,  in the last 72 hours Hepatic Function Panel  Recent Labs  08/26/12 0408  PROT 6.4  ALBUMIN 2.7*  AST 26  ALT 23  ALKPHOS 96  BILITOT 0.8   No results found for this basename: INR,  in the last 72 hours BNP (last 3 results) No results found for this basename: PROBNP,  in the last 8760 hours  Lipid Panel  No results found for this basename:  chol, trig, hdl, cholhdl, vldl, ldlcalc    ECG today: ST at 120 with RBBB and repolarization changes.  Imaging:  No results found.    Assessment/Plan:   Active Problems:   Abdominal pain, acute, right lower quadrant   HTN (hypertension)   Obesity, unspecified   CAD- s/p CABG in 1998 - patent grafts on cath in 2010   Tolerated surgery from cardiac standpoint. No ECG changes. Sinus tachycardia, agree with increased iv lopressor now at 5 mg every 4 hrs. Consider 2.5 mg PRN dose if HR >125. F/U ECG tomorrow.   Lennette Bihari, MD, [facility] 08/28/2012, 9:17 AM

## 2012-08-29 LAB — CBC WITH DIFFERENTIAL/PLATELET
Basophils Absolute: 0.1 10*3/uL (ref 0.0–0.1)
Basophils Relative: 0 % (ref 0–1)
Eosinophils Relative: 1 % (ref 0–5)
HCT: 39 % (ref 39.0–52.0)
MCHC: 31 g/dL (ref 30.0–36.0)
MCV: 92.9 fL (ref 78.0–100.0)
Monocytes Absolute: 1.5 10*3/uL — ABNORMAL HIGH (ref 0.1–1.0)
RDW: 14.1 % (ref 11.5–15.5)

## 2012-08-29 LAB — BASIC METABOLIC PANEL
Calcium: 8.5 mg/dL (ref 8.4–10.5)
Creatinine, Ser: 0.91 mg/dL (ref 0.50–1.35)
GFR calc Af Amer: >90 mL/min (ref >90)
GFR calc non Af Amer: 83 mL/min — ABNORMAL LOW (ref >90)

## 2012-08-29 MED ORDER — BISACODYL 10 MG RE SUPP
10.0000 mg | Freq: Once | RECTAL | Status: AC
  Administered 2012-08-29: 10 mg via RECTAL
  Filled 2012-08-29: qty 1

## 2012-08-29 MED ORDER — SODIUM CHLORIDE 0.9 % IV BOLUS (SEPSIS)
500.0000 mL | Freq: Once | INTRAVENOUS | Status: AC
  Administered 2012-08-29: 500 mL via INTRAVENOUS

## 2012-08-29 NOTE — Progress Notes (Signed)
Patient ID: Joseph Bryan, male   DOB: July 29, 1940, 72 y.o.   MRN: [id number]  General Surgery - [facility] - Progress Note  POD# 2  Subjective: Patient ambulating in hall this AM.  Hungry - NG out yesterday.  No nausea or emesis.  Objective: Vital signs in last 24 hours: Temp:  [97.7 F (36.5 C)-98.7 F (37.1 C)] 98.6 F (37 C) (08/17 0658) Pulse Rate:  [96-145] 98 (08/17 0834) Resp:  [16-20] 16 (08/17 0658) BP: (115-136)/(70-80) 136/70 mmHg (08/17 0834) SpO2:  [91 %-99 %] 93 % (08/17 0658) Last BM Date: 08/26/12  Intake/Output from previous day: 08/16 0701 - 08/17 0700 In: 2958.3 [P.O.:260; I.V.:2698.3] Out: 780 [Urine:730; Emesis/NG output:50]  Exam: HEENT - clear, not icteric Neck - soft Chest - clear bilaterally Cor - RRR, no murmur Abd - soft, obese; dressing dry and intact; rare BS present Ext - no significant edema Neuro - grossly intact, no focal deficits  Lab Results:   Recent Labs  08/27/12 0455 08/29/12 0613  WBC 12.4* 14.7*  HGB 11.7* 12.1*  HCT 37.7* 39.0  PLT 192 232     Recent Labs  08/27/12 0455 08/29/12 0613  NA 135 135  K 3.6 3.8  CL 101 103  CO2 27 27  GLUCOSE 132* 132*  BUN 8 9  CREATININE 0.99 0.91  CALCIUM 8.8 8.5    Studies/Results: No results found.  Assessment / Plan: 1.  Status post ileocecectomy for colonic ischemia  Allow clear liquid diet today  Ambulate  Appreciate cardiology management  Velora Heckler, MD, [facility] Office: [phone]  08/29/2012

## 2012-08-29 NOTE — Progress Notes (Signed)
Patient's output noted to be decreased. Only 100 ml since 0725 this morning and patient is complaining of burning when he urinates. Called and notified Dr. Gerrit Friends of output. Advised to check bladder scan for retention and to start 500 ml NS bolus over 2 hours. Bladder scan revealed only 140 ml, bolus started. Will continue to monitor output. [name] RN

## 2012-08-29 NOTE — Progress Notes (Signed)
Pt's HR sustaining in 140s, VS stable, pt asymptomatic. On-call cardiologist paged. Orders to give lopressor early if HR increases and sustains in 150s. Will continue to monitor at this time.  Earnest Conroy. Clelia Croft, RN

## 2012-08-30 ENCOUNTER — Encounter (HOSPITAL_COMMUNITY): Payer: Self-pay | Admitting: General Surgery

## 2012-08-30 DIAGNOSIS — I10 Essential (primary) hypertension: Secondary | ICD-10-CM

## 2012-08-30 NOTE — Progress Notes (Signed)
Paged IV team 

## 2012-08-30 NOTE — Progress Notes (Signed)
Patient ID: Joseph Bryan, male   DOB: 1940-01-17, 72 y.o.   MRN: [id number]  General Surgery - [facility] - Progress Note  POD#3  Subjective: Just had a BM, denies n/v, still feels very distended, pain well controlled  Objective: Vital signs in last 24 hours: Temp:  [98 F (36.7 C)-98.7 F (37.1 C)] 98.5 F (36.9 C) (08/18 0633) Pulse Rate:  [85-98] 85 (08/18 0633) Resp:  [16] 16 (08/18 0633) BP: (136-153)/(70-86) 144/74 mmHg (08/18 0633) SpO2:  [94 %-96 %] 94 % (08/18 1478) Last BM Date: 08/24/12  Intake/Output from previous day: 08/17 0701 - 08/18 0700 In: 2641.7 [P.O.:660; I.V.:1981.7] Out: 925 [Urine:925]  Exam: Chest - clear bilaterally Heart - RRR, no murmur Abd - mildly distended, obese; dressing dry and intact x some dry blood at base, BS present   Lab Results:   Recent Labs  08/29/12 0613  WBC 14.7*  HGB 12.1*  HCT 39.0  PLT 232     Recent Labs  08/29/12 0613  NA 135  K 3.8  CL 103  CO2 27  GLUCOSE 132*  BUN 9  CREATININE 0.91  CALCIUM 8.5    Studies/Results: No results found.  Assessment / Plan: 1.  Status post ileocecectomy for colonic ischemia 08/27/12  Advance to fulls, then soft if tolerated  Ambulate  Appreciate cardiology management  WHITE, [facility] Office: [phone]  08/30/2012

## 2012-08-30 NOTE — Progress Notes (Signed)
Feels better.  Exam shows distension but nontender. Adv diet and see how hw does.  Home next 1 -2 days.

## 2012-08-31 NOTE — Progress Notes (Signed)
Patient vomited after eating 8 bites of his soft diet tray this morning.  Encouraged patient to take it slow and eat foods that he knows he can tolerate. Will continue the soft diet and wait to advance to regular diet until patient can tolerate more.

## 2012-08-31 NOTE — Progress Notes (Signed)
Seen and agree.  Looks better.  Hopefully home wed.

## 2012-08-31 NOTE — Progress Notes (Signed)
Patient ID: Joseph Bryan, male   DOB: 1940-04-18, 72 y.o.   MRN: [id number]  General Surgery - [facility] - Progress Note  POD#4  Subjective: Doing well overall, feels a little better today, +flatus but no BM since yesterday, denies nv, pain well controlled  Objective: Vital signs in last 24 hours: Temp:  [98.3 F (36.8 C)-98.9 F (37.2 C)] 98.9 F (37.2 C) (08/19 0424) Pulse Rate:  [85-95] 85 (08/19 0424) Resp:  [16-20] 18 (08/19 0424) BP: (145-161)/(65-79) 145/65 mmHg (08/19 0424) SpO2:  [96 %-100 %] 100 % (08/19 0424) Last BM Date: 08/30/12  Intake/Output from previous day: 08/18 0701 - 08/19 0700 In: 1286.7 [P.O.:360; I.V.:926.7] Out: 1125 [Urine:1125]  Exam: Chest - clear bilaterally Heart - RRR, no murmur Abd - mildly distended but softer then yesterday, obese; dressing dry and intact x some dry blood at base, BS present   Lab Results:   Recent Labs  08/29/12 0613  WBC 14.7*  HGB 12.1*  HCT 39.0  PLT 232     Recent Labs  08/29/12 0613  NA 135  K 3.8  CL 103  CO2 27  GLUCOSE 132*  BUN 9  CREATININE 0.91  CALCIUM 8.5    Studies/Results: No results found.  Assessment / Plan: 1.  Status post ileocecectomy for colonic ischemia 08/27/12  Advance to regular at dinner  Ambulate  Appreciate cardiology management  Possibly home tomorrow if doing well  WHITE, [facility] Office: [phone]  08/31/2012

## 2012-09-01 DIAGNOSIS — R609 Edema, unspecified: Secondary | ICD-10-CM

## 2012-09-01 DIAGNOSIS — R601 Generalized edema: Secondary | ICD-10-CM | POA: Diagnosis present

## 2012-09-01 LAB — BASIC METABOLIC PANEL
BUN: 8 mg/dL (ref 6–23)
CO2: 26 mEq/L (ref 19–32)
Calcium: 8.7 mg/dL (ref 8.4–10.5)
Creatinine, Ser: 0.8 mg/dL (ref 0.50–1.35)
Glucose, Bld: 127 mg/dL — ABNORMAL HIGH (ref 70–99)
Sodium: 135 mEq/L (ref 135–145)

## 2012-09-01 LAB — CBC
HCT: 37.2 % — ABNORMAL LOW (ref 39.0–52.0)
Hemoglobin: 11.9 g/dL — ABNORMAL LOW (ref 13.0–17.0)
MCH: 29.3 pg (ref 26.0–34.0)
MCV: 91.6 fL (ref 78.0–100.0)
RBC: 4.06 MIL/uL — ABNORMAL LOW (ref 4.22–5.81)

## 2012-09-01 LAB — GLUCOSE, CAPILLARY

## 2012-09-01 MED ORDER — HYDROCODONE-ACETAMINOPHEN 5-325 MG PO TABS
0.5000 | ORAL_TABLET | ORAL | Status: DC | PRN
  Administered 2012-09-01 – 2012-09-03 (×8): 2 via ORAL
  Administered 2012-09-03: 1 via ORAL
  Administered 2012-09-03 – 2012-09-05 (×6): 2 via ORAL
  Filled 2012-09-01: qty 1
  Filled 2012-09-01 (×17): qty 2

## 2012-09-01 MED ORDER — FUROSEMIDE 10 MG/ML IJ SOLN
40.0000 mg | Freq: Two times a day (BID) | INTRAMUSCULAR | Status: AC
  Administered 2012-09-01 – 2012-09-04 (×7): 40 mg via INTRAVENOUS
  Filled 2012-09-01 (×8): qty 4

## 2012-09-01 MED ORDER — TRIAMTERENE-HCTZ 37.5-25 MG PO TABS
1.0000 | ORAL_TABLET | Freq: Every day | ORAL | Status: DC
  Administered 2012-09-01: 1 via ORAL
  Filled 2012-09-01: qty 1

## 2012-09-01 MED ORDER — LISINOPRIL 5 MG PO TABS
5.0000 mg | ORAL_TABLET | Freq: Every day | ORAL | Status: DC
  Administered 2012-09-01 – 2012-09-05 (×5): 5 mg via ORAL
  Filled 2012-09-01 (×5): qty 1

## 2012-09-01 MED ORDER — POTASSIUM CHLORIDE CRYS ER 20 MEQ PO TBCR
20.0000 meq | EXTENDED_RELEASE_TABLET | Freq: Every day | ORAL | Status: DC
  Administered 2012-09-01 – 2012-09-02 (×2): 20 meq via ORAL
  Filled 2012-09-01 (×2): qty 1

## 2012-09-01 MED ORDER — SODIUM CHLORIDE 0.9 % IJ SOLN
3.0000 mL | Freq: Two times a day (BID) | INTRAMUSCULAR | Status: DC
  Administered 2012-09-01 – 2012-09-04 (×8): 3 mL via INTRAVENOUS

## 2012-09-01 MED ORDER — ATENOLOL 100 MG PO TABS
100.0000 mg | ORAL_TABLET | Freq: Every day | ORAL | Status: DC
  Administered 2012-09-01 – 2012-09-05 (×5): 100 mg via ORAL
  Filled 2012-09-01 (×5): qty 1

## 2012-09-01 MED ORDER — SODIUM CHLORIDE 0.9 % IJ SOLN: 3.0000 mL | INTRAMUSCULAR | Status: DC | PRN

## 2012-09-01 NOTE — Progress Notes (Addendum)
Pt. Seen and examined. Agree with the NP/PA-C note as written.  Mr. Amini is indeed volume-overloaded- I's/O's indicate +7L (post-op weight 297 lbs - unknown current weight). He is anasarcic on exam, JVP appears elevated, but he has a thick neck. There is scrotal edema. I would recommend discontinuing his po diuretic combination and starting IV diuretics (lasix 40 IV BID). Will need to follow daily weights and strict I's/O's.  I would also recommend a repeat echocardiogram, as we have really no idea what his LV function is since it was reported normal back in 2010. I have also added salt and fluid intake restrictions to his diet.  We will continue to follow with you.   Chrystie Nose, MD, [facility] Attending Cardiologist [facility]

## 2012-09-01 NOTE — Progress Notes (Signed)
5 Days Post-Op  Subjective: Pt still c/o tightness in his abdomen and distension.  Notes nausea and a few episodes of vomiting yesterday.  Today he tolerated his breakfast, but could only eat a few bites.  He denies feeling very hungry.  He continues to have BM's and flatus.  Ambulating a few times in the hall.  C/o swelling in his hands/feet.  Denies any SOB or chest pain at rest or with ambulation.  Objective: Vital signs in last 24 hours: Temp:  [97.8 F (36.6 C)-98.5 F (36.9 C)] 98.4 F (36.9 C) (08/20 0438) Pulse Rate:  [81-89] 81 (08/20 0739) Resp:  [16-18] 16 (08/20 0438) BP: (128-156)/(63-86) 129/68 mmHg (08/20 0739) SpO2:  [96 %-97 %] 96 % (08/20 0438) Last BM Date: 08/31/12  Intake/Output from previous day: 08/19 0701 - 08/20 0700 In: 1900 [P.O.:300; I.V.:1600] Out: 700 [Urine:700] Intake/Output this shift:    PE: Gen:  Alert, NAD, pleasant Card:  Distant heart sounds, RRR, no M/G/R heard Pulm:  CTA, no W/R/R, good effort Abd: largely obese, soft, continues to be distended, +BS,  incisions C/D/I (removed honeycomb dressing), no erythema Ext:  B/l +2 pitting edema, intact DP pulses  Lab Results:  No results found for this basename: WBC, HGB, HCT, PLT,  in the last 72 hours BMET No results found for this basename: NA, K, CL, CO2, GLUCOSE, BUN, CREATININE, CALCIUM,  in the last 72 hours PT/INR No results found for this basename: LABPROT, INR,  in the last 72 hours CMP     Component Value Date/Time   NA 135 08/29/2012 0613   K 3.8 08/29/2012 0613   CL 103 08/29/2012 0613   CO2 27 08/29/2012 0613   GLUCOSE 132* 08/29/2012 0613   BUN 9 08/29/2012 0613   CREATININE 0.91 08/29/2012 0613   CALCIUM 8.5 08/29/2012 0613   PROT 6.4 08/26/2012 0408   ALBUMIN 2.7* 08/26/2012 0408   AST 26 08/26/2012 0408   ALT 23 08/26/2012 0408   ALKPHOS 96 08/26/2012 0408   BILITOT 0.8 08/26/2012 0408   GFRNONAA 83* 08/29/2012 0613   GFRAA >90 08/29/2012 0613   Lipase  No results found for this  basename: lipase       Studies/Results: No results found.  Anti-infectives: Anti-infectives   Start     Dose/Rate Route Frequency Ordered Stop   08/27/12 2200  piperacillin-tazobactam (ZOSYN) IVPB 3.375 g     3.375 g 12.5 mL/hr over 240 Minutes Intravenous 3 times per day 08/27/12 1804 08/28/12 1855   08/26/12 0600  piperacillin-tazobactam (ZOSYN) IVPB 3.375 g  Status:  Discontinued     3.375 g 12.5 mL/hr over 240 Minutes Intravenous 3 times per day 08/26/12 0001 08/27/12 1804   08/25/12 2200  piperacillin-tazobactam (ZOSYN) IVPB 3.375 g     3.375 g 100 mL/hr over 30 Minutes Intravenous  Once 08/25/12 2123 08/25/12 2256       Assessment/Plan Status post ileocecectomy for colonic ischemia 08/27/12 POD #5 1.  Tolerating liquids and small amount of solid food 2.  Ambulate (goal 6 times today) and IS, SCD's and lovenox 3.  Called back cards to eval given pitting edema 4.  SL IV, restart home BP meds, ordered Bmet and CBC 5.  May need lasix 6.  Possibly home today, will recheck this afternoon.  He is not very mobile yet due to fatigue in his legs, he's also having diet concerns due to nausea.  He's still on IV pain meds, switched to PO pain meds.  B/L LE edema - Fluid overload? Obesity HTN CAD s/p CABG in 1998      LOS: 7 days    DORT, Mckenlee Mangham 09/01/2012, 8:24 AM Pager: [phone]

## 2012-09-01 NOTE — Progress Notes (Signed)
Patient's skin is very tight with +1 pitting edema.  Spoke with him about using the urinal and he understands about needing to measure.  The tip of his penis is very swollen but has voided 3 separate times and a 4th time consisting of 150 ml since 19:00 8/19.  Bladder scan performed showing . patient is not in any respiratory distress and is resting comfortably now.  Will continue to monitor and pass information along during report.  Ernesta Amble, RN

## 2012-09-01 NOTE — Progress Notes (Signed)
Coming along slowly.  Probably home Thursday.

## 2012-09-01 NOTE — Progress Notes (Signed)
[facility]  Subjective: Only complaint is swelling in the legs, feet and left hand. Denies SOB. No chest pain.   Objective: Vital signs in last 24 hours: Temp:  [97.8 F (36.6 C)-98.5 F (36.9 C)] 98 F (36.7 C) (08/20 1423) Pulse Rate:  [64-89] 70 (08/20 1423) Resp:  [16-18] 18 (08/20 1423) BP: (123-156)/(63-86) 135/71 mmHg (08/20 1423) SpO2:  [93 %-97 %] 94 % (08/20 1423) Last BM Date: 08/31/12  Intake/Output from previous day: 08/19 0701 - 08/20 0700 In: 1900 [P.O.:300; I.V.:1600] Out: 700 [Urine:700] Intake/Output this shift: Total I/O In: 300 [P.O.:300] Out: 450 [Urine:450]  Medications Current Facility-Administered Medications  Medication Dose Route Frequency Provider Last Rate Last Dose  . antiseptic oral rinse (BIOTENE) solution 15 mL  15 mL Mouth Rinse BID Caleen Essex III, MD   15 mL at 09/01/12 0737  . atenolol (TENORMIN) tablet 100 mg  100 mg Oral Daily Megan Dort, PA-C   100 mg at 09/01/12 1150  . enoxaparin (LOVENOX) injection 40 mg  40 mg Subcutaneous Q24H Kandis Cocking, MD   40 mg at 09/01/12 1150  . HYDROcodone-acetaminophen (NORCO/VICODIN) 5-325 MG per tablet 0.5-2 tablet  0.5-2 tablet Oral Q4H PRN Aris Georgia, PA-C   2 tablet at 09/01/12 1047  . lisinopril (PRINIVIL,ZESTRIL) tablet 5 mg  5 mg Oral Daily Megan Dort, PA-C   5 mg at 09/01/12 1151  . ondansetron (ZOFRAN) injection 4 mg  4 mg Intravenous Q6H PRN Caleen Essex III, MD   4 mg at 08/31/12 2212  . pantoprazole (PROTONIX) injection 40 mg  40 mg Intravenous QHS Caleen Essex III, MD   40 mg at 08/31/12 2332  . potassium chloride SA (K-DUR,KLOR-CON) CR tablet 20 mEq  20 mEq Oral Daily Megan Dort, PA-C   20 mEq at 09/01/12 1151  . sodium chloride 0.9 % injection 3 mL  3 mL Intravenous Q12H Megan Dort, PA-C   3 mL at 09/01/12 1032  . sodium chloride 0.9 % injection 3 mL  3 mL Intravenous PRN Megan Dort, PA-C      . triamterene-hydrochlorothiazide (MAXZIDE-25) 37.5-25 MG per  tablet 1 tablet  1 tablet Oral Daily Megan Dort, PA-C   1 tablet at 09/01/12 1151    PE: General appearance: alert, cooperative and no distress Lungs: faint bibasilar crackles and faint bilateral wheezes Heart: regular rate and rhythm Abdomen: +BS, + distension, nontender Extremities: 1+ Bilateral LEE, + Left hand edema Pulses: 2+ and symmetric Skin: warm and dry Neurologic: Grossly normal  Lab Results:   Recent Labs  09/01/12 0911  WBC 10.7*  HGB 11.9*  HCT 37.2*  PLT 259   BMET  Recent Labs  09/01/12 0911  NA 135  K 3.4*  CL 102  CO2 26  GLUCOSE 127*  BUN 8  CREATININE 0.80  CALCIUM 8.7   Assessment/Plan  Active Problems:   Abdominal pain, acute, right lower quadrant   HTN (hypertension)   Obesity, unspecified   CAD- s/p CABG in 1998 - patent grafts on cath in 2010  Plan:  Day 5 s/p laparoscopy; Ileocecectomy. Pt noted to have increased swelling in the distal extremities and felt to be volume overloaded. IVFs have been discontinued. He was restarted on his home thiazide diuretic today. However, with his current volume status (+7.0 L), he will need a more potent diuretic for fluid removal. Would recommend IV diuretic with lasix if able to re-establish IV access. Renal function is stable. Will need  to watch for hypokalemia and replete as needed. We will order a 2 D echo to re-evaluate systolic function. Continue with strict I/Os and daily weights.    LOS: 7 days    Keldrick Pomplun M. Delmer Islam 09/01/2012 3:31 PM

## 2012-09-02 DIAGNOSIS — I369 Nonrheumatic tricuspid valve disorder, unspecified: Secondary | ICD-10-CM

## 2012-09-02 LAB — BASIC METABOLIC PANEL
CO2: 32 mEq/L (ref 19–32)
Chloride: 95 mEq/L — ABNORMAL LOW (ref 96–112)
Creatinine, Ser: 0.99 mg/dL (ref 0.50–1.35)
Glucose, Bld: 105 mg/dL — ABNORMAL HIGH (ref 70–99)

## 2012-09-02 MED ORDER — DOCUSATE SODIUM 100 MG PO CAPS
100.0000 mg | ORAL_CAPSULE | Freq: Two times a day (BID) | ORAL | Status: DC
  Administered 2012-09-02 – 2012-09-05 (×4): 100 mg via ORAL
  Filled 2012-09-02 (×8): qty 1

## 2012-09-02 MED ORDER — POTASSIUM CHLORIDE CRYS ER 20 MEQ PO TBCR
40.0000 meq | EXTENDED_RELEASE_TABLET | Freq: Every day | ORAL | Status: DC
  Administered 2012-09-03 – 2012-09-04 (×2): 40 meq via ORAL
  Filled 2012-09-02 (×2): qty 2

## 2012-09-02 MED ORDER — BISACODYL 10 MG RE SUPP
10.0000 mg | Freq: Once | RECTAL | Status: AC
  Administered 2012-09-02: 10 mg via RECTAL
  Filled 2012-09-02: qty 1

## 2012-09-02 NOTE — Progress Notes (Signed)
  Echocardiogram 2D Echocardiogram has been performed.  Cathie Beams 09/02/2012, 9:44 AM

## 2012-09-02 NOTE — Progress Notes (Signed)
Patient ID: Joseph Bryan, male   DOB: May 11, 1940, 72 y.o.   MRN: [id number] 6 Days Post-Op  Subjective: About the same today, still somewhat tight in abd, denies nv but has poor appetite, +flatus and BM, pain well controlled.  Objective: Vital signs in last 24 hours: Temp:  [97.8 F (36.6 C)-98.5 F (36.9 C)] 97.8 F (36.6 C) (08/21 0604) Pulse Rate:  [62-70] 62 (08/21 0604) Resp:  [18-20] 18 (08/21 0604) BP: (107-142)/(63-79) 107/63 mmHg (08/21 0604) SpO2:  [94 %-98 %] 98 % (08/20 2205) Last BM Date: 08/31/12  Intake/Output from previous day: 08/20 0701 - 08/21 0700 In: 540 [P.O.:540] Out: 3225 [Urine:3225] Intake/Output this shift:    PE: Gen:  Alert, NAD, pleasant Abd: largely obese, continues to be distended, +BS,  incisions C/D/I, no erythema Ext:  B/l +2 pitting edema  Lab Results:   Recent Labs  09/01/12 0911  WBC 10.7*  HGB 11.9*  HCT 37.2*  PLT 259   BMET  Recent Labs  09/01/12 0911  NA 135  K 3.4*  CL 102  CO2 26  GLUCOSE 127*  BUN 8  CREATININE 0.80  CALCIUM 8.7   PT/INR No results found for this basename: LABPROT, INR,  in the last 72 hours CMP     Component Value Date/Time   NA 135 09/01/2012 0911   K 3.4* 09/01/2012 0911   CL 102 09/01/2012 0911   CO2 26 09/01/2012 0911   GLUCOSE 127* 09/01/2012 0911   BUN 8 09/01/2012 0911   CREATININE 0.80 09/01/2012 0911   CALCIUM 8.7 09/01/2012 0911   PROT 6.4 08/26/2012 0408   ALBUMIN 2.7* 08/26/2012 0408   AST 26 08/26/2012 0408   ALT 23 08/26/2012 0408   ALKPHOS 96 08/26/2012 0408   BILITOT 0.8 08/26/2012 0408   GFRNONAA 87* 09/01/2012 0911   GFRAA >90 09/01/2012 0911   Lipase  No results found for this basename: lipase       Studies/Results: No results found.  Anti-infectives: Anti-infectives   Start     Dose/Rate Route Frequency Ordered Stop   08/27/12 2200  piperacillin-tazobactam (ZOSYN) IVPB 3.375 g     3.375 g 12.5 mL/hr over 240 Minutes Intravenous 3 times per day 08/27/12 1804  08/28/12 1855   08/26/12 0600  piperacillin-tazobactam (ZOSYN) IVPB 3.375 g  Status:  Discontinued     3.375 g 12.5 mL/hr over 240 Minutes Intravenous 3 times per day 08/26/12 0001 08/27/12 1804   08/25/12 2200  piperacillin-tazobactam (ZOSYN) IVPB 3.375 g     3.375 g 100 mL/hr over 30 Minutes Intravenous  Once 08/25/12 2123 08/25/12 2256       Assessment/Plan Status post ileocecectomy for colonic ischemia 08/27/12 POD #6 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 Have consulted Oncology to make further recommendations 2.  Tolerating diet but poor appetite 3.  Ambulate (goal 6 times today) and IS, SCD's and lovenox 4.  On Lasix 40mg  iv bid given pitting edema 5.  SL IV, restart home BP meds, ordered Bmet and CBC 6.  With volume overload feel like he should stay here atleast another day 7.  Needs home walker  B/L LE edema - Fluid overload Obesity HTN CAD s/p CABG in 1998      LOS: 8 days    Sarim Rothman 09/02/2012, 9:10 AM

## 2012-09-02 NOTE — Progress Notes (Signed)
Appendiceal carcinoma noted.  Ischemia normal.  Oncology consulted.  Volume overloaded.

## 2012-09-02 NOTE — Progress Notes (Signed)
[facility]  Subjective: No complaints. Denies SOB. Feels that his swelling has decreased.   Objective: Vital signs in last 24 hours: Temp:  [97.8 F (36.6 C)-98.3 F (36.8 C)] 98.2 F (36.8 C) (08/21 1424) Pulse Rate:  [60-69] 60 (08/21 1424) Resp:  [18-20] 18 (08/21 1424) BP: (107-127)/(61-66) 124/61 mmHg (08/21 1424) SpO2:  [95 %-98 %] 95 % (08/21 1424) Last BM Date: 09/02/12  Intake/Output from previous day: 08/20 0701 - 08/21 0700 In: 540 [P.O.:540] Out: 3225 [Urine:3225] Intake/Output this shift: Total I/O In: 180 [P.O.:180] Out: 800 [Urine:800]  Medications Current Facility-Administered Medications  Medication Dose Route Frequency Provider Last Rate Last Dose  . antiseptic oral rinse (BIOTENE) solution 15 mL  15 mL Mouth Rinse BID Caleen Essex III, MD   15 mL at 09/02/12 0800  . atenolol (TENORMIN) tablet 100 mg  100 mg Oral Daily Megan Dort, PA-C   100 mg at 09/02/12 1100  . bisacodyl (DULCOLAX) suppository 10 mg  10 mg Rectal Once Megan Dort, PA-C      . docusate sodium (COLACE) capsule 100 mg  100 mg Oral BID Megan Dort, PA-C      . enoxaparin (LOVENOX) injection 40 mg  40 mg Subcutaneous Q24H Kandis Cocking, MD   40 mg at 09/02/12 1121  . furosemide (LASIX) injection 40 mg  40 mg Intravenous BID Brittainy Simmons, PA-C   40 mg at 09/02/12 0900  . HYDROcodone-acetaminophen (NORCO/VICODIN) 5-325 MG per tablet 0.5-2 tablet  0.5-2 tablet Oral Q4H PRN Aris Georgia, PA-C   2 tablet at 09/02/12 1345  . lisinopril (PRINIVIL,ZESTRIL) tablet 5 mg  5 mg Oral Daily Megan Dort, PA-C   5 mg at 09/02/12 1100  . ondansetron (ZOFRAN) injection 4 mg  4 mg Intravenous Q6H PRN Caleen Essex III, MD   4 mg at 09/01/12 2218  . pantoprazole (PROTONIX) injection 40 mg  40 mg Intravenous QHS Caleen Essex III, MD   40 mg at 09/01/12 2234  . potassium chloride SA (K-DUR,KLOR-CON) CR tablet 20 mEq  20 mEq Oral Daily Megan Dort, PA-C   20 mEq at 09/02/12 1100  . sodium  chloride 0.9 % injection 3 mL  3 mL Intravenous Q12H Megan Dort, PA-C   3 mL at 09/02/12 1000  . sodium chloride 0.9 % injection 3 mL  3 mL Intravenous PRN Aris Georgia, PA-C        PE: General appearance: alert, cooperative, no distress and moderately obese Lungs: faint bibasilar crackles Heart: regular rate and rhythm Extremities: 1+ bilateral LEE Pulses: 2+ and symmetric Skin: warm and dry Neurologic: Grossly normal  Lab Results:   Recent Labs  09/01/12 0911  WBC 10.7*  HGB 11.9*  HCT 37.2*  PLT 259   BMET  Recent Labs  09/01/12 0911 09/02/12 1025  NA 135 133*  K 3.4* 3.3*  CL 102 95*  CO2 26 32  GLUCOSE 127* 105*  BUN 8 10  CREATININE 0.80 0.99  CALCIUM 8.7 8.9   Filed Weights   08/26/12 0000  Weight: 297 lb 9.9 oz (135 kg)   Studies/Results: 2D echo performed- reading pending  Assessment/Plan  Principal Problem:   Abdominal pain, acute, right lower quadrant Active Problems:   HTN (hypertension)   Obesity, unspecified   CAD- s/p CABG in 1998 - patent grafts on cath in 2010   Anasarca  Plan: Edema has improved. Pt has had good diuresis with IV Lasix. He is still fluid  positive at +4L, but diuresed >2L in past 24 hours. Daily weights were ordered yesterday, but none recorded. I have spoken with RN and asked to continue strict I/Os and to please start daily weights to help guide further treatment. Renal function is stable w/ SCr of 0.99. Continue with BID IV Lasix. He is hypokalemic with K+ of 3.3. Will increase daily potassium to 40 mg daily while on IV diuretic. He is also mildly hyponatremic with Na+ of 133. Will need to monitor closely while diuresing. 2D echo was done today to re-evaluate LV function. Reading by cardiologist is pending. We will return tomorrow to re-evaluate status.     LOS: 8 days   Brittainy M. Delmer Islam 09/02/2012 2:42 PM  I have seen and evaluated the patient this PM along with Boyce Medici, PA. I agree with her findings,  examination as well as impression recommendations.  Continues to be volume overloaded post-operatively - after aggressive hydration.  Still + 4 L overall.  I agree with continued IV diuresis -- keeping close eye on Na++ level with poor PO intake.  Would like to get him close to net even fluid status.   Echo read by my partner today (Normal cardiac EF & diastolic parameters) -- I think his volume overload can be otherwise explained.  Can probably convert to PO Lasix in AM if taking PO well -- may not need to be on it for long @ home post d/c.  Will change to 40mg  bid PO in AM for now.  Anticipate that by mid week next week, this can be stopped -- would need close f/u with either PCP or our office (can be with PA/NP) to reassess volume status.  Will reassess tomorrow in anticipation of d/c potentially later in afternoon vs. On Saturday.  BP & HR remain stable.  MD Time with pt: 5 min  HARDING,DAVID W, M.D., M.S. [facility] [address] [place], [place]  [address]  [phone] Pager # [phone] 09/02/2012 5:13 PM

## 2012-09-03 ENCOUNTER — Inpatient Hospital Stay (HOSPITAL_COMMUNITY): Payer: Medicare Other

## 2012-09-03 LAB — BASIC METABOLIC PANEL
CO2: 32 mEq/L (ref 19–32)
Calcium: 8.8 mg/dL (ref 8.4–10.5)
Glucose, Bld: 90 mg/dL (ref 70–99)
Sodium: 132 mEq/L — ABNORMAL LOW (ref 135–145)

## 2012-09-03 MED ORDER — ADULT MULTIVITAMIN W/MINERALS CH
1.0000 | ORAL_TABLET | Freq: Every day | ORAL | Status: DC
  Administered 2012-09-03 – 2012-09-05 (×3): 1 via ORAL
  Filled 2012-09-03 (×3): qty 1

## 2012-09-03 MED ORDER — ENSURE COMPLETE PO LIQD
237.0000 mL | Freq: Two times a day (BID) | ORAL | Status: DC
  Administered 2012-09-03 – 2012-09-05 (×4): 237 mL via ORAL

## 2012-09-03 NOTE — Progress Notes (Signed)
Patient ID: Joseph Bryan, male   DOB: [birth year], 72 y.o.   MRN: [id number] 7 Days Post-Op  Subjective: A little nausea and vomiting overnight, not able to take much in this am, +flatus and BM, pain well controlled.  Objective: Vital signs in last 24 hours: Temp:  [97.8 F (36.6 C)-98.4 F (36.9 C)] 97.8 F (36.6 C) (08/22 0622) Pulse Rate:  [60-64] 64 (08/22 0622) Resp:  [16-20] 20 (08/22 0622) BP: (98-124)/(59-61) 106/59 mmHg (08/22 0622) SpO2:  [95 %] 95 % (08/22 0622) Weight:  [305 lb 1.6 oz (138.392 kg)-306 lb 4.8 oz (138.937 kg)] 305 lb 1.6 oz (138.392 kg) (08/22 0622) Last BM Date: 09/02/12  Intake/Output from previous day: 08/21 0701 - 08/22 0700 In: 180 [P.O.:180] Out: 1525 [Urine:1525] Intake/Output this shift:    PE: Gen:  Alert, NAD, pleasant Abd: largely obese, continues to be some what distended, +BS,  incisions C/D/I, no erythema Ext:  Upper ext edema mostly resolved, still with 1+lower ext pitting edema  Lab Results:   Recent Labs  09/01/12 0911  WBC 10.7*  HGB 11.9*  HCT 37.2*  PLT 259   BMET  Recent Labs  09/02/12 1025 09/03/12 0400  NA 133* 132*  K 3.3* 3.1*  CL 95* 94*  CO2 32 32  GLUCOSE 105* 90  BUN 10 11  CREATININE 0.99 1.01  CALCIUM 8.9 8.8   PT/INR No results found for this basename: LABPROT, INR,  in the last 72 hours CMP     Component Value Date/Time   NA 132* 09/03/2012 0400   K 3.1* 09/03/2012 0400   CL 94* 09/03/2012 0400   CO2 32 09/03/2012 0400   GLUCOSE 90 09/03/2012 0400   BUN 11 09/03/2012 0400   CREATININE 1.01 09/03/2012 0400   CALCIUM 8.8 09/03/2012 0400   PROT 6.4 08/26/2012 0408   ALBUMIN 2.7* 08/26/2012 0408   AST 26 08/26/2012 0408   ALT 23 08/26/2012 0408   ALKPHOS 96 08/26/2012 0408   BILITOT 0.8 08/26/2012 0408   GFRNONAA 72* 09/03/2012 0400   GFRAA 84* 09/03/2012 0400   Lipase  No results found for this basename: lipase       Studies/Results: No results found.  Anti-infectives: Anti-infectives   Start     Dose/Rate Route Frequency Ordered Stop   08/27/12 2200  piperacillin-tazobactam (ZOSYN) IVPB 3.375 g     3.375 g 12.5 mL/hr over 240 Minutes Intravenous 3 times per day 08/27/12 1804 08/28/12 1855   08/26/12 0600  piperacillin-tazobactam (ZOSYN) IVPB 3.375 g  Status:  Discontinued     3.375 g 12.5 mL/hr over 240 Minutes Intravenous 3 times per day 08/26/12 0001 08/27/12 1804   08/25/12 2200  piperacillin-tazobactam (ZOSYN) IVPB 3.375 g     3.375 g 100 mL/hr over 30 Minutes Intravenous  Once 08/25/12 2123 08/25/12 2256       Assessment/Plan Status post ileocecectomy for colonic ischemia 08/27/12 POD #7 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 Have consulted Oncology to make further recommendations and they will see as an outpat 2.  Tolerating diet but poor appetite 3.  Ambulate (goal 6 times today) and IS, SCD's and lovenox 4.  On Lasix 40mg  iv bid given pitting edema 5.  SL IV, restart home BP meds, ordered Bmet and CBC 6.  Home walker in room  Given some n/v and poor appetite he probably needs to stay today and maybe hope for discharge tomorrow.  B/L LE edema - Fluid  overload, improving Obesity HTN CAD s/p CABG in 1998      LOS: 9 days    Arvid Marengo 09/03/2012, 8:27 AM

## 2012-09-03 NOTE — Progress Notes (Signed)
INITIAL NUTRITION ASSESSMENT  DOCUMENTATION CODES Per approved criteria  -Morbid Obesity   INTERVENTION: - Ensure Complete BID - Assisted pt with ordering late lunch - Multivitamin 1 tablet PO daily  - Will continue to monitor   NUTRITION DIAGNOSIS: Inadequate oral intake related to poor appetite as evidenced by <25% meal intake.   Goal: Pt to consume >90% of meals/supplements  Monitor:  Weights, labs, intake  Reason for Assessment: Low braden  72 y.o. male  Admitting Dx: Abdominal pain, acute, right lower quadrant  ASSESSMENT: Pt admitted with RLQ pain. 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 Cardiology following, noted pt to be +7L 8/20.   Met with pt who reports typically eating well, 3 meals/day, with good appetite and stable weight PTA. Pt had nausea and vomiting earlier this week. Pt denies any nausea today and reports having vomiting of clear liquid earlier this morning but states it was more like spit up than true all out vomiting. Pt did not order lunch but ate some breakfast, only 15% of meal. Pt interested in getting Ensure.   - Sodium slightly low - Potassium low (getting oral KCl)  Height: Ht Readings from Last 1 Encounters:  09/02/12 6' (1.829 m)    Weight: Wt Readings from Last 1 Encounters:  09/03/12 305 lb 1.6 oz (138.392 kg)    Ideal Body Weight: 178 lb   % Ideal Body Weight: 171%  Wt Readings from Last 10 Encounters:  09/03/12 305 lb 1.6 oz (138.392 kg)  09/03/12 305 lb 1.6 oz (138.392 kg)    Usual Body Weight: 296 lb per pt  % Usual Body Weight: 103%  BMI:  Body mass index is 41.37 kg/(m^2). Class III extreme obesity  Estimated Nutritional Needs: Kcal: 2050-2250 Protein: 95-115g Fluid: 2-2.2L/day   Skin: +1 generalized edema, RLE, LLE edema  Diet Order: Dysphagia 3, 1.5L fluid restriction, 2g sodium   EDUCATION NEEDS: -No education needs identified at this time   Intake/Output Summary  (Last 24 hours) at 09/03/12 1355 Last data filed at 09/03/12 0903  Gross per 24 hour  Intake     60 ml  Output   1525 ml  Net  -1465 ml    Last BM: 8/22   Labs:   Recent Labs Lab 09/01/12 0911 09/02/12 1025 09/03/12 0400  NA 135 133* 132*  K 3.4* 3.3* 3.1*  CL 102 95* 94*  CO2 26 32 32  BUN 8 10 11   CREATININE 0.80 0.99 1.01  CALCIUM 8.7 8.9 8.8  GLUCOSE 127* 105* 90    CBG (last 3)   Recent Labs  09/01/12 2208  GLUCAP 99    Scheduled Meds: . antiseptic oral rinse  15 mL Mouth Rinse BID  . atenolol  100 mg Oral Daily  . docusate sodium  100 mg Oral BID  . enoxaparin (LOVENOX) injection  40 mg Subcutaneous Q24H  . furosemide  40 mg Intravenous BID  . lisinopril  5 mg Oral Daily  . pantoprazole (PROTONIX) IV  40 mg Intravenous QHS  . potassium chloride  40 mEq Oral Daily  . sodium chloride  3 mL Intravenous Q12H    Continuous Infusions:   Past Medical History  Diagnosis Date  . Coronary artery disease   . Myocardial infarction     Past Surgical History  Procedure Laterality Date  . Lung surgery      right and left lungs - "had air pockets"  . Coronary artery bypass graft    .  Colon resection N/A 08/27/2012    Procedure: Diagnostic laparoscopy; Ileocecectomy;  Surgeon: Lodema Pilot, DO;  Location: [facility];  Service: General;  Laterality: N/A;    Joseph Hedger MS, RD, LDN [phone] Pager [phone] After Hours Pager

## 2012-09-03 NOTE — Progress Notes (Signed)
Check plain films.  Volume overloaded.  Slow mobility.  Needs diuresis.  Check KUB since he has nausea and vomiting.

## 2012-09-03 NOTE — Progress Notes (Signed)
      Subjective: No complaints  Objective: Vital signs in last 24 hours: Temp:  [97.8 F (36.6 C)-98.4 F (36.9 C)] 97.8 F (36.6 C) (08/22 0622) Pulse Rate:  [60-64] 64 (08/22 0622) Resp:  [16-20] 20 (08/22 0622) BP: (98-106)/(55-60) 104/55 mmHg (08/22 1015) SpO2:  [95 %] 95 % (08/22 0622) Weight:  [305 lb 1.6 oz (138.392 kg)-306 lb 4.8 oz (138.937 kg)] 305 lb 1.6 oz (138.392 kg) (08/22 0622) Weight change:  Last BM Date: 09/03/12 Intake/Output from previous day: -1345 (total since admit + 3467)   Wt down 1 lb from yesterday.  08/21 0701 - 08/22 0700 In: 180 [P.O.:180] Out: 1525 [Urine:1525] Intake/Output this shift: Total I/O In: 60 [P.O.:60] Out: -   PE: General:Pleasant affect, NAD Skin:Warm and dry, brisk capillary refill HEENT:normocephalic, sclera clear, mucus membranes moist Neck:supple, no JVD, no bruits  Heart:S1S2 RRR without murmur, gallup, rub or click Lungs:clear without rales, rhonchi, or wheezes WJX:BJYN, non tender, + BS, do not palpate liver spleen or masses Ext:1-2+ lower ext edema,  2+ radial pulses Neuro:alert and oriented, MAE, follows commands, + facial symmetry   Lab Results:  Recent Labs  09/01/12 0911  WBC 10.7*  HGB 11.9*  HCT 37.2*  PLT 259   BMET  Recent Labs  09/02/12 1025 09/03/12 0400  NA 133* 132*  K 3.3* 3.1*  CL 95* 94*  CO2 32 32  GLUCOSE 105* 90  BUN 10 11  CREATININE 0.99 1.01  CALCIUM 8.9 8.8   Studies/Results: No results found.  Medications: I have reviewed the patient's current medications. Scheduled Meds: . antiseptic oral rinse  15 mL Mouth Rinse BID  . atenolol  100 mg Oral Daily  . docusate sodium  100 mg Oral BID  . enoxaparin (LOVENOX) injection  40 mg Subcutaneous Q24H  . feeding supplement  237 mL Oral BID BM  . furosemide  40 mg Intravenous BID  . lisinopril  5 mg Oral Daily  . multivitamin with minerals  1 tablet Oral Daily  . pantoprazole (PROTONIX) IV  40 mg Intravenous QHS  .  potassium chloride  40 mEq Oral Daily  . sodium chloride  3 mL Intravenous Q12H   Continuous Infusions:  PRN Meds:.HYDROcodone-acetaminophen, ondansetron, sodium chloride  Assessment/Plan: Principal Problem:   Abdominal pain, acute, right lower quadrant Active Problems:   HTN (hypertension)   Obesity, unspecified   CAD- s/p CABG in 1998 - patent grafts on cath in 2010   Anasarca  PLAN: for volume overload--continue IV lasix.  Will need lexiscan myoview once recovered from Ileocecectomy for colonic ischemia-post op day 7.   LOS: 9 days   Time spent with pt. :15 minutes. [facility] R  Nurse Practitioner Certified Pager [phone] 09/03/2012, 3:43 PM   Agree with note written by Nada Boozer RNP  Still volume overloaded. 1-2+ pitting edema and wgt up 10#. Renal fxn stable. Would continue IV lasix one more day and D/C home on Maxide (home diuretic) on Sunday with close OP F/U.   Runell Gess 09/03/2012 6:54 PM

## 2012-09-03 NOTE — Progress Notes (Signed)
Rec'd pt from off going RN...... Condition stable. Initial assessment unchanged at this time. Cont with plan of care.  

## 2012-09-04 DIAGNOSIS — E876 Hypokalemia: Secondary | ICD-10-CM

## 2012-09-04 MED ORDER — FUROSEMIDE 40 MG PO TABS
40.0000 mg | ORAL_TABLET | Freq: Two times a day (BID) | ORAL | Status: DC
  Administered 2012-09-05 (×2): 40 mg via ORAL
  Filled 2012-09-04 (×3): qty 1

## 2012-09-04 MED ORDER — POTASSIUM CHLORIDE CRYS ER 20 MEQ PO TBCR: 20.0000 meq | EXTENDED_RELEASE_TABLET | Freq: Every day | ORAL | Status: DC

## 2012-09-04 MED ORDER — POTASSIUM CHLORIDE 10 MEQ/100ML IV SOLN
10.0000 meq | INTRAVENOUS | Status: DC
  Administered 2012-09-04: 10 meq via INTRAVENOUS
  Filled 2012-09-04 (×3): qty 100

## 2012-09-04 MED ORDER — POTASSIUM CHLORIDE CRYS ER 20 MEQ PO TBCR
40.0000 meq | EXTENDED_RELEASE_TABLET | Freq: Two times a day (BID) | ORAL | Status: DC
  Administered 2012-09-04 – 2012-09-05 (×2): 40 meq via ORAL
  Filled 2012-09-04 (×4): qty 2

## 2012-09-04 NOTE — Progress Notes (Signed)
Subjective: No complaints besides KCL IV is burning his hand.  Moving bowels a bit. Ambulated in the hall last PM Continues to have brisk UOP on IV Lasix  Objective: Vital signs in last 24 hours: Temp:  [98 F (36.7 C)-98.5 F (36.9 C)] 98 F (36.7 C) (08/23 0450) Pulse Rate:  [60-66] 60 (08/23 0450) Resp:  [20-22] 20 (08/23 0450) BP: (95-109)/(50-57) 109/57 mmHg (08/23 0450) SpO2:  [93 %-96 %] 95 % (08/23 0450) Weight:  [302 lb 1.6 oz (137.032 kg)-302 lb 4 oz (137.1 kg)] 302 lb 4 oz (137.1 kg) (08/23 0450) Weight change: -4 lb 3.2 oz (-1.905 kg) Last BM Date: 09/03/12 Intake/Output from previous day: -1345 (total since admit + 3467)   Wt down 1 lb from yesterday.  08/22 0701 - 08/23 0700 In: 783 [P.O.:780; I.V.:3] Out: 1550 [Urine:1550] Intake/Output this shift: Total I/O In: 120 [P.O.:120] Out: -   PE: General:Pleasant affect, NAD Skin:Warm and dry, brisk capillary refill HEENT:normocephalic, sclera clear, mucus membranes moist Neck:supple, no JVD, no bruits  Heart:S1S2 RRR without murmur, gallup, rub or click Lungs: mild R sides rales, rest CTA.  Non-labored. EAV:WUJW, non tender, + BS, do not palpate liver spleen or masses Ext:1-2+ lower ext edema (he says that it is close to his baseline),  2+ radial pulses Neuro:alert and oriented, MAE, follows commands, + facial symmetry  Recent Labs  09/02/12 1025 09/03/12 0400  NA 133* 132*  K 3.3* 3.1*  CL 95* 94*  CO2 32 32  GLUCOSE 105* 90  BUN 10 11  CREATININE 0.99 1.01  CALCIUM 8.9 8.8   Studies/Results: reviwed.  Medications: I have reviewed the patient's current medications. Scheduled Meds: . antiseptic oral rinse  15 mL Mouth Rinse BID  . atenolol  100 mg Oral Daily  . docusate sodium  100 mg Oral BID  . enoxaparin (LOVENOX) injection  40 mg Subcutaneous Q24H  . feeding supplement  237 mL Oral BID BM  . furosemide  40 mg Intravenous BID  . [START ON 09/05/2012] furosemide  40 mg Oral BID  .  lisinopril  5 mg Oral Daily  . multivitamin with minerals  1 tablet Oral Daily  . pantoprazole (PROTONIX) IV  40 mg Intravenous QHS  . potassium chloride  10 mEq Intravenous Q1 Hr x 3  . potassium chloride  40 mEq Oral Daily  . sodium chloride  3 mL Intravenous Q12H   Continuous Infusions:  PRN Meds:.HYDROcodone-acetaminophen, ondansetron, sodium chloride  Assessment/Plan: Principal Problem:   Abdominal pain, acute, right lower quadrant Active Problems:   HTN (hypertension)   Obesity, unspecified   CAD- s/p CABG in 1998 - patent grafts on cath in 2010   Anasarca  PLAN: Still a bit volume overloaded -continue IV lasix today -- have written for PO to start tomorrow AM.    Can consider OP lexiscan myoview once recovered from Ileocecectomy for colonic ischemia-post op day 7.   Will probably be able to go back to [facility] as OP - but would use PO Lasix 40mg  bid x 1-2 days post d/c.    K+ being repleted (may not need to be all IV) & can give PO doses for d/c.  Would check CMP mid week to ensure stability.  We will try to get him set up to see a PA/NP @ [facility] by the end of the week to reassess fluid status.  Anticipate D/c Sunday with close OP F/U.   Time with Pt &  chart 28 min  HARDING,DAVID W 09/04/2012 10:27 AM

## 2012-09-04 NOTE — Progress Notes (Signed)
8 Days Post-Op  Subjective: Seen in bathroom.  Bowels moving eating better.  Still 2 L up.  Moving better.   Objective: Vital signs in last 24 hours: Temp:  [98 F (36.7 C)-98.5 F (36.9 C)] 98 F (36.7 C) (08/23 0450) Pulse Rate:  [60-66] 60 (08/23 0450) Resp:  [20-22] 20 (08/23 0450) BP: (95-109)/(50-57) 109/57 mmHg (08/23 0450) SpO2:  [93 %-96 %] 95 % (08/23 0450) Weight:  [302 lb 1.6 oz (137.032 kg)-302 lb 4 oz (137.1 kg)] 302 lb 4 oz (137.1 kg) (08/23 0450) Last BM Date: 20-Sep-2012  Intake/Output from previous day: 09-21-2022 0701 - 08/23 0700 In: 783 [P.O.:780; I.V.:3] Out: 1550 [Urine:1550] Intake/Output this shift:    looks well .  In bathroom. Incision intact.    Lab Results:   Recent Labs  09/01/12 0911  WBC 10.7*  HGB 11.9*  HCT 37.2*  PLT 259   BMET  Recent Labs  09/02/12 1025 September 20, 2012 0400  NA 133* 132*  K 3.3* 3.1*  CL 95* 94*  CO2 32 32  GLUCOSE 105* 90  BUN 10 11  CREATININE 0.99 1.01  CALCIUM 8.9 8.8   PT/INR No results found for this basename: LABPROT, INR,  in the last 72 hours ABG No results found for this basename: PHART, PCO2, PO2, HCO3,  in the last 72 hours  Studies/Results: Dg Abd 2 Views  09/20/12   *RADIOLOGY REPORT*  Clinical Data: Abdominal pain, postoperative ileus.  ABDOMEN - 2 VIEW  Comparison: CT scan of August 25, 2012.  Findings: Midline surgical staples are noted.  Moderately dilated small bowel loops are noted in the center and left side of the abdomen consistent with distal small bowel obstruction or ileus. No colonic dilatation is noted.  IMPRESSION: Moderately dilated small bowel loops are noted consistent with distal small bowel obstruction or postoperative ileus.  Follow-up radiographs are recommended.   Original Report Authenticated By: Lupita Raider.,  M.D.    Anti-infectives: Anti-infectives   Start     Dose/Rate Route Frequency Ordered Stop   08/27/12 2200  piperacillin-tazobactam (ZOSYN) IVPB 3.375 g     3.375  g 12.5 mL/hr over 240 Minutes Intravenous 3 times per day 08/27/12 1804 08/28/12 1855   08/26/12 0600  piperacillin-tazobactam (ZOSYN) IVPB 3.375 g  Status:  Discontinued     3.375 g 12.5 mL/hr over 240 Minutes Intravenous 3 times per day 08/26/12 0001 08/27/12 1804   08/25/12 2200  piperacillin-tazobactam (ZOSYN) IVPB 3.375 g     3.375 g 100 mL/hr over 30 Minutes Intravenous  Once 08/25/12 2123 08/25/12 2256      Assessment/Plan: s/p Procedure(s): Diagnostic laparoscopy; Ileocecectomy (N/A) Hypokalemia  Replace give aggressive diuresis both PO and IV.  Up 2 L HOPEFULLY HOME Sunday. REMOVE STAPLES Oncology as outpatient.  LOS: 10 days    Nga Rabon A. 09/04/2012

## 2012-09-05 LAB — BASIC METABOLIC PANEL
Calcium: 8.9 mg/dL (ref 8.4–10.5)
Creatinine, Ser: 1.02 mg/dL (ref 0.50–1.35)
GFR calc Af Amer: 83 mL/min — ABNORMAL LOW (ref >90)
GFR calc non Af Amer: 71 mL/min — ABNORMAL LOW (ref >90)

## 2012-09-05 MED ORDER — ENSURE COMPLETE PO LIQD: 237.0000 mL | Freq: Two times a day (BID) | ORAL | Status: DC

## 2012-09-05 MED ORDER — HYDROCODONE-ACETAMINOPHEN 5-325 MG PO TABS: 0.5000 | ORAL_TABLET | ORAL | Status: DC | PRN

## 2012-09-05 NOTE — Progress Notes (Signed)
We will have out office contact the patent early this week to schedule close f/u appt with a Physician Extender (PA/NP) by mid to end of this week.  Agree with d/c on prior home regimen.  I/O is just about even.  Marykay Lex, MD

## 2012-09-05 NOTE — Progress Notes (Signed)
9 Days Post-Op  Subjective: Doing well some wound drainage while walking yesterday.  Almost back down to matched I/O   Objective: Vital signs in last 24 hours: Temp:  [97.7 F (36.5 C)-98.1 F (36.7 C)] 98.1 F (36.7 C) (08/24 0519) Pulse Rate:  [60-68] 60 (08/24 0519) Resp:  [16-22] 16 (08/24 0519) BP: (107-118)/(50-59) 107/50 mmHg (08/24 0519) SpO2:  [95 %-98 %] 98 % (08/24 0519) Weight:  [298 lb 9.6 oz (135.444 kg)-301 lb 4.8 oz (136.669 kg)] 298 lb 9.6 oz (135.444 kg) (08/24 0519) Last BM Date: 09/04/12  Intake/Output from previous day: 08/23 0701 - 08/24 0700 In: 360 [P.O.:360] Out: 2450 [Urine:2450] Intake/Output this shift:    Incision/Wound:staples out.  Mild drainage noted inferior portion of incision. No pus or redness.    Lab Results:  No results found for this basename: WBC, HGB, HCT, PLT,  in the last 72 hours BMET  Recent Labs  2012-10-01 0400 09/05/12 0458  NA 132* 134*  K 3.1* 3.6  CL 94* 93*  CO2 32 35*  GLUCOSE 90 101*  BUN 11 14  CREATININE 1.01 1.02  CALCIUM 8.8 8.9   PT/INR No results found for this basename: LABPROT, INR,  in the last 72 hours ABG No results found for this basename: PHART, PCO2, PO2, HCO3,  in the last 72 hours  Studies/Results: Dg Abd 2 Views  Oct 01, 2012   *RADIOLOGY REPORT*  Clinical Data: Abdominal pain, postoperative ileus.  ABDOMEN - 2 VIEW  Comparison: CT scan of August 25, 2012.  Findings: Midline surgical staples are noted.  Moderately dilated small bowel loops are noted in the center and left side of the abdomen consistent with distal small bowel obstruction or ileus. No colonic dilatation is noted.  IMPRESSION: Moderately dilated small bowel loops are noted consistent with distal small bowel obstruction or postoperative ileus.  Follow-up radiographs are recommended.   Original Report Authenticated By: Lupita Raider.,  M.D.    Anti-infectives: Anti-infectives   Start     Dose/Rate Route Frequency Ordered Stop   08/27/12 2200  piperacillin-tazobactam (ZOSYN) IVPB 3.375 g     3.375 g 12.5 mL/hr over 240 Minutes Intravenous 3 times per day 08/27/12 1804 08/28/12 1855   08/26/12 0600  piperacillin-tazobactam (ZOSYN) IVPB 3.375 g  Status:  Discontinued     3.375 g 12.5 mL/hr over 240 Minutes Intravenous 3 times per day 08/26/12 0001 08/27/12 1804   08/25/12 2200  piperacillin-tazobactam (ZOSYN) IVPB 3.375 g     3.375 g 100 mL/hr over 30 Minutes Intravenous  Once 08/25/12 2123 08/25/12 2256      Assessment/Plan: s/p Procedure(s): Diagnostic laparoscopy; Ileocecectomy (N/A) Keep dry dressing over incision Discharge  LOS: 11 days    Ondrea Dow A. 09/05/2012

## 2012-09-05 NOTE — Discharge Summary (Signed)
Physician Discharge Summary  Patient ID: Joseph Bryan MRN: [id number] DOB/AGE: 72-Jan-1942 72 y.o.  Admit date: 08/25/2012 Discharge date: 09/05/2012  Admission Diagnoses:cecal ischemia Patient Active Problem List   Diagnosis Date Noted  . Anasarca 09/01/2012  . Abdominal pain, acute, right lower quadrant 08/26/2012  . HTN (hypertension) 08/26/2012  . Obesity, unspecified 08/26/2012  . CAD- s/p CABG in 1998 - patent grafts on cath in 2010 08/26/2012    Discharge Diagnoses: same  Mucinous carcinoma of appendix Principal Problem:   Abdominal pain, acute, right lower quadrant Active Problems:   HTN (hypertension)   Obesity, unspecified   CAD- s/p CABG in 1998 - patent grafts on cath in 2010   Anasarca   Discharged Condition: good  Hospital Course: See notes.  Underwent laparoscopic converted to open right colectomy for ischemia.  Incidental mucinous carcinoma of appendix noted on path.  Had issues with CHF and volume overload and cardiology consulted.  Underwent aggressive diuresis.  Bowel function slowly returned and he tolerated diet.  Bowels moving and good pain control.   Staples out POD 8.  Ambulating well and discharged.   Consults: cardiology and oncology Dr Truett Perna   Significant Diagnostic Studies: labs:  CMP     Component Value Date/Time   NA 134* 09/05/2012 0458   K 3.6 09/05/2012 0458   CL 93* 09/05/2012 0458   CO2 35* 09/05/2012 0458   GLUCOSE 101* 09/05/2012 0458   BUN 14 09/05/2012 0458   CREATININE 1.02 09/05/2012 0458   CALCIUM 8.9 09/05/2012 0458   PROT 6.4 08/26/2012 0408   ALBUMIN 2.7* 08/26/2012 0408   AST 26 08/26/2012 0408   ALT 23 08/26/2012 0408   ALKPHOS 96 08/26/2012 0408   BILITOT 0.8 08/26/2012 0408   GFRNONAA 71* 09/05/2012 0458   GFRAA 83* 09/05/2012 0458     Treatments: surgery: right colectomy  Discharge Exam: Blood pressure 107/50, pulse 60, temperature 98.1 F (36.7 C), temperature source Oral, resp. rate 16, height 6' (1.829 m), weight 298  lb 9.6 oz (135.444 kg), SpO2 98.00%. Resp: clear to auscultation bilaterally Cardio: regular rate and rhythm, S1, S2 normal, no murmur, click, rub or gallop Incision/Wound:min serous drainage lower part of incision.  No redness or pus. Soft obese abdomen  Disposition:   Discharge Orders   Future Appointments Provider Department Dept Phone   09/20/2012 1:30 PM [facility] MED[facility] [phone]   09/20/2012 2:00 PM Ladene Artist, MD [facility] MEDICAL ONCOLOGY [phone]   10/04/2012 9:15 AM Lennette Bihari, MD [facility] [place] [phone]   Future Orders Complete By Expires   Change dressing (specify)  As directed    Comments:     Dressing change: 2 times per day using gauze.   Diet - low sodium heart healthy  As directed    Driving Restrictions  As directed    Comments:     No driving for 2 weeks   Increase activity slowly  As directed    Lifting restrictions  As directed    Comments:     No lifting for 4 weeks       Medication List         atenolol 100 MG tablet  Commonly known as:  TENORMIN  Take 100 mg by mouth daily.     feeding supplement Liqd  Take 237 mLs by mouth 2 (two) times daily between meals.     HYDROcodone-acetaminophen 5-325 MG per tablet  Commonly known as:  NORCO/VICODIN  Take 0.5-2 tablets by mouth every 4 (four) hours as needed.     lisinopril 10 MG tablet  Commonly known as:  PRINIVIL,ZESTRIL  Take 10 mg by mouth daily.     OSTEO BI-FLEX REGULAR STRENGTH PO  Take 1 tablet by mouth.     triamterene-hydrochlorothiazide 37.5-25 MG per tablet  Commonly known as:  MAXZIDE-25  Take 1 tablet by mouth daily.     vitamin B-12 1000 MCG tablet  Commonly known as:  CYANOCOBALAMIN  Take 1,000 mcg by mouth daily.     vitamin B-6 25 MG tablet  Commonly known as:  pyridOXINE  Take 25 mg by mouth daily.           Follow-up Information   Follow up  with Lennette Bihari, MD On 10/04/2012. (9:15)    Specialty:  Cardiology   Contact information:   [address] [place] [address] [phone]       Signed: Dortha Schwalbe. 09/05/2012, 8:41 AM

## 2012-09-06 ENCOUNTER — Telehealth: Payer: Self-pay | Admitting: Cardiovascular Disease

## 2012-09-06 NOTE — Telephone Encounter (Signed)
Returned call.  Line busy x 2.  Will try again later.  

## 2012-09-06 NOTE — Telephone Encounter (Signed)
Per note by Dr. Herbie Baltimore, pt to be seen this week.  Returned call.  No answer/voicemail.  Will try later.

## 2012-09-06 NOTE — Telephone Encounter (Signed)
Just discharged from [facility] he start back start taking his aspirin? The nurse at the hospital told him to check with Dr Tresa Endo.

## 2012-09-06 NOTE — Telephone Encounter (Signed)
Returned call and spoke w/ Steward Drone, pt's wife.  Informed message received and per Dr. Herbie Baltimore, pt to be seen this week.  Verbalized understanding and appt scheduled for Wednesday at 3pm (8/27).

## 2012-09-07 ENCOUNTER — Telehealth: Payer: Self-pay | Admitting: *Deleted

## 2012-09-07 NOTE — Telephone Encounter (Signed)
Spoke with patient by phone and confirmed appointment with Dr. Truett Perna for 09/20/12.  Contact names and numbers were provided.

## 2012-09-08 ENCOUNTER — Encounter: Payer: Self-pay | Admitting: Cardiovascular Disease

## 2012-09-08 ENCOUNTER — Ambulatory Visit (INDEPENDENT_AMBULATORY_CARE_PROVIDER_SITE_OTHER): Payer: Medicare Other | Admitting: Cardiovascular Disease

## 2012-09-08 VITALS — BP 110/70 | HR 62 | Ht 73.0 in | Wt 293.6 lb

## 2012-09-08 DIAGNOSIS — G4733 Obstructive sleep apnea (adult) (pediatric): Secondary | ICD-10-CM

## 2012-09-08 DIAGNOSIS — E669 Obesity, unspecified: Secondary | ICD-10-CM

## 2012-09-08 DIAGNOSIS — I10 Essential (primary) hypertension: Secondary | ICD-10-CM

## 2012-09-08 DIAGNOSIS — I4510 Unspecified right bundle-branch block: Secondary | ICD-10-CM

## 2012-09-08 DIAGNOSIS — I2510 Atherosclerotic heart disease of native coronary artery without angina pectoris: Secondary | ICD-10-CM

## 2012-09-08 DIAGNOSIS — E785 Hyperlipidemia, unspecified: Secondary | ICD-10-CM

## 2012-09-08 DIAGNOSIS — R600 Localized edema: Secondary | ICD-10-CM

## 2012-09-08 DIAGNOSIS — R609 Edema, unspecified: Secondary | ICD-10-CM

## 2012-09-08 DIAGNOSIS — Z79899 Other long term (current) drug therapy: Secondary | ICD-10-CM

## 2012-09-08 DIAGNOSIS — I252 Old myocardial infarction: Secondary | ICD-10-CM

## 2012-09-08 MED ORDER — FUROSEMIDE 20 MG PO TABS: ORAL_TABLET | ORAL | Status: DC

## 2012-09-08 NOTE — Patient Instructions (Addendum)
Your physician has recommended you make the following change in your medication: STOP TRIAMTERENE-HYDROCHLOROTHIAZIDE(MAXZIDE) AND START FUROSEMIDE 20 MG take 40 mg 2 tablets for 3 days then 20 mg daily.  Your physician recommends that you return for lab work in:3 WEEKS  CMET, [facility]  Your physician recommends that you schedule a follow-up appointment in: 2 MONTHS

## 2012-09-20 ENCOUNTER — Ambulatory Visit: Payer: Medicare Other

## 2012-09-20 ENCOUNTER — Encounter (INDEPENDENT_AMBULATORY_CARE_PROVIDER_SITE_OTHER): Payer: Medicare Other | Admitting: Surgery

## 2012-09-20 ENCOUNTER — Ambulatory Visit: Payer: Medicare Other | Admitting: Cardiology

## 2012-09-20 ENCOUNTER — Encounter: Payer: Self-pay | Admitting: Oncology

## 2012-09-20 ENCOUNTER — Ambulatory Visit (HOSPITAL_BASED_OUTPATIENT_CLINIC_OR_DEPARTMENT_OTHER): Payer: Medicare Other | Admitting: Oncology

## 2012-09-20 VITALS — BP 128/88 | HR 105 | Temp 97.1°F | Resp 20 | Ht 73.0 in | Wt 287.9 lb

## 2012-09-20 DIAGNOSIS — C181 Malignant neoplasm of appendix: Secondary | ICD-10-CM

## 2012-09-20 NOTE — Progress Notes (Signed)
[facility] New Patient Consult   Referring MD: Randell Teare 72 y.o.  [birth year]    Reason for Referral: Appendix cancer     HPI: He was admitted on 08/25/2012 after developing acute abdominal pain. A CT revealed thickening of the cecum with surrounding inflammation and probable distention of the appendix. The liver and adrenal glands appear normal.  Surgery was consulted. He was taken the operating room on 08/27/2012 and underwent a diagnostic laparoscopy with a laparoscopic ileocecectomy. An ischemic patch of cecum was noted without evidence of perforation. A nondilated appendix was noted. The exam was otherwise normal.  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 The surgical margins were negative for dysplasia or malignancy. 4 lymph nodes were negative for tumor. Adenomatous epithelium as well as extravasated mucin extended through the appendiceal wall involving the mesoappendix soft tissue. A gross appendiceal perforation was identified.  The postoperative course was complicated by CHF/volume overload. He reports undergoing diuresis and he is followed by Dr. Tresa Endo. The edema has improved. The abdominal pain resolved after surgery.    Past Medical History  Diagnosis Date  . Coronary artery disease   . Myocardial infarction  approximately 14 years ago     Past Surgical History  Procedure Laterality Date  . Lung surgery   in his 20s     right and left lungs - "had air pockets"  . Coronary artery bypass graft   14 years ago   . Colon resection N/A 08/27/2012    Procedure: Diagnostic laparoscopy; Ileocecectomy;  Surgeon: Lodema Pilot, DO;  Location: [facility];  Service: General;  Laterality: N/A;    Family History  Problem Relation Age of Onset  . Hypertension Father    no family history of cancer. One brother and one sister, 3 children  Current outpatient prescriptions:atenolol  (TENORMIN) 100 MG tablet, Take 100 mg by mouth daily., Disp: , Rfl: ;  feeding supplement (ENSURE COMPLETE) LIQD, Take 237 mLs by mouth 2 (two) times daily between meals., Disp: 20 Bottle, Rfl: 1;  furosemide (LASIX) 20 MG tablet, Take 40 mg (2 tablet) for 3 days then 20 mg daily, Disp: 60 tablet, Rfl: 6;  Glucosamine-Chondroitin (OSTEO BI-FLEX REGULAR STRENGTH PO), Take 1 tablet by mouth., Disp: , Rfl:  HYDROcodone-acetaminophen (NORCO/VICODIN) 5-325 MG per tablet, Take 0.5-2 tablets by mouth every 4 (four) hours as needed., Disp: 30 tablet, Rfl: 0;  lisinopril (PRINIVIL,ZESTRIL) 10 MG tablet, Take 10 mg by mouth daily., Disp: , Rfl: ;  simvastatin (ZOCOR) 40 MG tablet, Take 1 tablet by mouth daily., Disp: , Rfl: ;  vitamin B-12 (CYANOCOBALAMIN) 1000 MCG tablet, Take 1,000 mcg by mouth daily., Disp: , Rfl:  vitamin B-6 (PYRIDOXINE) 25 MG tablet, Take 25 mg by mouth daily., Disp: , Rfl:   Allergies: No Known Allergies  Social History: He lives in [place], he is a retired [employer] architect, he does not use cigarettes or alcohol. . No risk factors for HIV or hepatitis. He smokes cigarettes in the remote past    ROS:   Positives include:  A complete ROS was otherwise negative.  Physical Exam:  Blood pressure 128/88, pulse 105, temperature 97.1 F (36.2 C), temperature source Oral, resp. rate 20, height 6\' 1"  (1.854 m), weight 287 lb 14.4 oz (130.591 kg).  HEENT: Upper and lower denture plate, oropharynx without visible mass, neck without mass Lungs: Inspiratory rales at the lower posterior chest bilaterally, no respiratory distress  Cardiac: Regular rate and rhythm Abdomen: No hepatosplenomegaly, healed midline incision, no mass GU: Testes without mass, the left testicle is smaller than the right  Vascular: Trace edema at the low leg and foot bilaterally Lymph nodes: No cervical, supra-clavicular, axillary, or inguinal nodes Neurologic: Alert and oriented, the motor exam appears intact in  the upper and lower extremities Skin: Yeast rash at the groin Musculoskeletal: No spine tenderness   LAB:  CBC  Lab Results  Component Value Date   WBC 10.7* 09/01/2012   HGB 11.9* 09/01/2012   HCT 37.2* 09/01/2012   MCV 91.6 09/01/2012   PLT 259 09/01/2012     CMP      Component Value Date/Time   NA 134* 09/05/2012 0458   K 3.6 09/05/2012 0458   CL 93* 09/05/2012 0458   CO2 35* 09/05/2012 0458   GLUCOSE 101* 09/05/2012 0458   BUN 14 09/05/2012 0458   CREATININE 1.02 09/05/2012 0458   CALCIUM 8.9 09/05/2012 0458   PROT 6.4 08/26/2012 0408   ALBUMIN 2.7* 08/26/2012 0408   AST 26 08/26/2012 0408   ALT 23 08/26/2012 0408   ALKPHOS 96 08/26/2012 0408   BILITOT 0.8 08/26/2012 0408   GFRNONAA 71* 09/05/2012 0458   GFRAA 83* 09/05/2012 0458   CEA on 08/26/2012-less than 0.5  Radiology: As per history of present illness   Assessment/Plan:   1. 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 Tumor arising in the appendix or cecum  2. History of coronary artery disease, status post coronary artery bypass surgery  3. History of congestive heart failure  4. Adenomatous polyp at the cecum on a colonoscopy 04/10/2009-incompletely removed, status post repeat colonoscopy 10/18/2009-no residual polyp was identified, repeat colonoscopy planned for 5 years  Disposition:   He presented with acute abdominal pain and underwent an ileocecectomy. He was diagnosed with a mucinous neoplasm of the cecum/appendix with pathologic evidence of appendix perforation. No clinical or x-ray evidence of metastatic disease. My initial impression is to not recommend adjuvant systemic therapy. He is at risk of developing recurrent tumor based on the extramural mucin/perforation. I will present his case at the GI tumor conference this week to better define the nature and T. stage of this tumor.  Mr. Schoenfeld will return for  an office visit and CEA in 6 months. Gearlene Godsil 09/20/2012, 6:24 PM

## 2012-09-20 NOTE — Progress Notes (Signed)
Checked in new pt with no financial concerns. °

## 2012-09-20 NOTE — Progress Notes (Signed)
Met with Joseph Bryan and family. Explained role of nurse navigator.  Reviewed cancer diagnosis as discussed per Dr. Truett Perna.  [facility] resources offered to patient.   Patient denied need for services at this time.   No barriers to care identified.  Contact names and phone numbers were provided for Dr. Truett Perna and [facility] team.  Patient without questions.

## 2012-09-21 ENCOUNTER — Telehealth: Payer: Self-pay | Admitting: *Deleted

## 2012-09-21 ENCOUNTER — Ambulatory Visit: Payer: Medicare Other | Admitting: Cardiology

## 2012-09-21 NOTE — Telephone Encounter (Signed)
sw pt gv appts for 03/21/13 w/ labs @ 3:15pm and ov@ 3:45pm. Pt is aware that i will mail a letter/avs...td

## 2012-09-22 ENCOUNTER — Ambulatory Visit (INDEPENDENT_AMBULATORY_CARE_PROVIDER_SITE_OTHER): Payer: Medicare Other | Admitting: General Surgery

## 2012-09-22 VITALS — BP 126/78 | HR 98 | Temp 97.0°F | Resp 18 | Ht 73.0 in | Wt 289.8 lb

## 2012-09-22 DIAGNOSIS — Z5189 Encounter for other specified aftercare: Secondary | ICD-10-CM

## 2012-09-22 DIAGNOSIS — Z4889 Encounter for other specified surgical aftercare: Secondary | ICD-10-CM

## 2012-09-22 NOTE — Progress Notes (Signed)
Subjective:     Patient ID: Joseph Bryan, male   DOB: 1940-10-16, 72 y.o.   MRN: [id number]  HPI This patient follows up status post laparoscopic assisted ileocecectomy for ischemic colon. His pathology was consistent with a mucinous appendiceal neoplasm with focal extension of the appendix but 0 out of 4 lymph nodes positive.  His hospital course was complicated with some fluid overload but he has responded nicely and denies any complaints. He has no abdominal pain and says that his bowels are functioning normally. His appetite is normal as well. He has already seen Dr Truett Perna and no chemotherapy was recommended this time.  Review of Systems     Objective:   Physical Exam No acute distress and nontoxic-appearing sitting comfortably on the bed His abdomen is soft and nontender on exam his incision is healing nicely without any sign of infection or hernia.    Assessment:     Status post ileocecectomy Mucinous appendiceal neoplasm We discussed the options for chemotherapy versus further surgical treatment and I explained that he is high risk for recurrence local disease. I offered and recommended that he be seen by Dr. Lenis Noon at [facility] to discuss other adjuvant options of and followup but he is not interested in this currently. He is not interested in chemotherapy and would like to just follow this clinically for signs of recurrence. At his age, I think that this is reasonable. I agree with colonoscopy in 3 months and he will set this up with his gastroenterologist Dr. Laural Benes.  Otherwise , he seems to be doing well and I would like to see him back in about 6 months for repeat examination.     Plan:     Colonoscopy in 3 months We will follow him clinically at his request and I will see him back in about 6 months If he changes his mind and would like to be evaluated at [facility] by the surgical oncologist there, I would be happy to refer him.

## 2012-09-24 NOTE — Progress Notes (Signed)
His case was presented at the GI tumor conference on 09/22/2012.  The tumor did a rise in the appendix with rupture into the mesoappendix with abscess formation. The tumor is low-grade and felt to have a low malignant potential. However he is at some risk of developing carcinomatosis secondary to the appendix rupture.  The consensus opinion from the GI tumor conference is to not recommend adjuvant therapy.

## 2012-10-04 ENCOUNTER — Encounter: Payer: Self-pay | Admitting: Cardiovascular Disease

## 2012-10-04 ENCOUNTER — Ambulatory Visit (INDEPENDENT_AMBULATORY_CARE_PROVIDER_SITE_OTHER): Payer: Medicare Other | Admitting: Cardiovascular Disease

## 2012-10-04 VITALS — BP 140/88 | Ht 73.0 in | Wt 292.4 lb

## 2012-10-04 DIAGNOSIS — I2510 Atherosclerotic heart disease of native coronary artery without angina pectoris: Secondary | ICD-10-CM

## 2012-10-04 DIAGNOSIS — I4510 Unspecified right bundle-branch block: Secondary | ICD-10-CM

## 2012-10-04 DIAGNOSIS — R609 Edema, unspecified: Secondary | ICD-10-CM

## 2012-10-04 DIAGNOSIS — R600 Localized edema: Secondary | ICD-10-CM

## 2012-10-04 DIAGNOSIS — I10 Essential (primary) hypertension: Secondary | ICD-10-CM

## 2012-10-04 DIAGNOSIS — G4733 Obstructive sleep apnea (adult) (pediatric): Secondary | ICD-10-CM

## 2012-10-04 LAB — COMPREHENSIVE METABOLIC PANEL
ALT: 61 U/L — ABNORMAL HIGH (ref 0–53)
AST: 42 U/L — ABNORMAL HIGH (ref 0–37)
Albumin: 3.6 g/dL (ref 3.5–5.2)
Calcium: 9.5 mg/dL (ref 8.4–10.5)
Chloride: 100 mEq/L (ref 96–112)
Creat: 0.89 mg/dL (ref 0.50–1.35)
Potassium: 4.4 mEq/L (ref 3.5–5.3)

## 2012-10-04 LAB — CBC
Platelets: 320 10*3/uL (ref 150–400)
RDW: 14.6 % (ref 11.5–15.5)
WBC: 10.4 10*3/uL (ref 4.0–10.5)

## 2012-10-04 LAB — LIPID PANEL: Total CHOL/HDL Ratio: 3.1 Ratio

## 2012-10-04 NOTE — Patient Instructions (Addendum)
Your physician has requested that you have a lexiscan myoview. For further information please visit [email or web address] Please follow instruction sheet, as given.  this will be done in November 2014.  Your physician recommends that you schedule a follow-up appointment in: December 2014.

## 2012-10-05 ENCOUNTER — Encounter: Payer: Self-pay | Admitting: Cardiovascular Disease

## 2012-10-08 ENCOUNTER — Encounter: Payer: Self-pay | Admitting: Cardiovascular Disease

## 2012-10-08 DIAGNOSIS — I252 Old myocardial infarction: Secondary | ICD-10-CM | POA: Insufficient documentation

## 2012-10-08 DIAGNOSIS — G4733 Obstructive sleep apnea (adult) (pediatric): Secondary | ICD-10-CM | POA: Insufficient documentation

## 2012-10-08 DIAGNOSIS — I4510 Unspecified right bundle-branch block: Secondary | ICD-10-CM | POA: Insufficient documentation

## 2012-10-08 DIAGNOSIS — R600 Localized edema: Secondary | ICD-10-CM | POA: Insufficient documentation

## 2012-10-08 NOTE — Progress Notes (Signed)
Patient ID: Joseph Bryan, male   DOB: January 30, 1940, 72 y.o.   MRN: [id number]     HPI: Joseph Bryan, is a 72 y.o. male who presents to the office today for followup cardiology evaluation.  Joseph Bryan as a history of known coronary artery disease . He suffered an anterior wall myocardial infarction in November 1998 and underwent primary PTCA of the totally occluded proximal LAD done by me. Due to the concomitant high-grade distal left main stenosis with ostial encroachment to a circumflex vessel following stabilization from his initial intervention and significant myocardial salvage he underwent elective CABG surgery (LIMA to LAD, vein to diagonal, vein to third marginal vessel). He did develop postoperative DVT for which he did require Coumadin therapy. His last catheterization was done in 2010 which showed patent grafts. He has a history of hyperlipidemia also peripheral neuropathy. He recently was admitted to [facility] with abdominal pain and I saw him for preoperative preoperative evaluation.  A preoperative 2-D echo Doppler study showed normal systolic function with mild/moderate tricuspid regurgitation. Mild pulmonary hypertension with a PA pressure 33 mm. He was found to have colonic ischemia with presumed appendix inflammation.  From a cardiac standpoint, he ultimately tolerated his abdominal surgery where he was found to have colonic ischemia and underwent laparoscopic ileo-colectomy by Dr. Biagio Quint.   Additional problems include  a history of sleep apnea and uses CPAP therapy 100% of the time. He has noticed increasing leg swelling which has not been improved with his Maxzide therapy. When I last saw him following his surgery, I discontinued his Maxzide and just recommended initiation of furosemide therapy at 40 mg for 3 days then 20 mg daily. This has improved his leg swelling but he still notes some occasional swelling. 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 He presents now for followup evaluation  Past Medical History  Diagnosis Date  . Coronary artery disease   . Myocardial infarction     Past Surgical History  Procedure Laterality Date  . Lung surgery      right and left lungs - "had air pockets"  . Coronary artery bypass graft    . Colon resection N/A 08/27/2012    Procedure: Diagnostic laparoscopy; Ileocecectomy;  Surgeon: Lodema Pilot, DO;  Location: [facility];  Service: General;  Laterality: N/A;    No Known Allergies  Current Outpatient Prescriptions  Medication Sig Dispense Refill  . atenolol (TENORMIN) 100 MG tablet Take 100 mg by mouth daily.      . feeding supplement (ENSURE COMPLETE) LIQD Take 237 mLs by mouth 2 (two) times daily between meals.  20 Bottle  1  . furosemide (LASIX) 20 MG tablet Take 40 mg (2 tablet) for 3 days then 20 mg daily  60 tablet  6  . Glucosamine-Chondroitin (OSTEO BI-FLEX REGULAR STRENGTH PO) Take 1 tablet by mouth.      Marland Kitchen HYDROcodone-acetaminophen (NORCO/VICODIN) 5-325 MG per tablet Take 0.5-2 tablets by mouth every 4 (four) hours as needed.  30 tablet  0  . lisinopril (PRINIVIL,ZESTRIL) 10 MG tablet Take 10 mg by mouth daily.      . simvastatin (ZOCOR) 40 MG tablet Take 1 tablet by mouth daily.      . vitamin B-12 (CYANOCOBALAMIN) 1000 MCG tablet Take 1,000 mcg by mouth daily.      . vitamin B-6 (PYRIDOXINE) 25 MG tablet Take 25 mg by mouth  daily.       No current facility-administered medications for this visit.    History   Social History  . Marital Status: Married    Spouse Name: N/A    Number of Children: N/A  . Years of Education: N/A   Occupational History  . Not on file.   Social History Main Topics  . Smoking status: Former Smoker    Types: Cigarettes    Quit date: 08/25/1988  . Smokeless tobacco: Never Used  . Alcohol Use: No  . Drug Use: No  . Sexual Activity: Not on file   Other Topics Concern  . Not on file    Social History Narrative  . No narrative on file    Family History  Problem Relation Age of Onset  . Hypertension Father    He is married has 3 children 4 grandchildren 2 great-grandchildren. There is remote tobacco history. ROS is negative for fevers, chills or night sweats. He denies chest tightness. At times there is a mild shortness of breath. He still notes an intermittent leg swelling. He denies bleeding. He is still weak. He denies paresthesias. There is no presyncope or syncope. He denies tachycardia palpitations the  Other system review is negative.  PE BP 140/88  Ht 6\' 1"  (1.854 m)  Wt 292 lb 6.4 oz (132.632 kg)  BMI 38.59 kg/m2  General: Alert, oriented, no distress.  Skin: normal turgor, no rashes HEENT: Normocephalic, atraumatic. Pupils round and reactive; sclera anicteric;no lid lag.  Nose without nasal septal hypertrophy Mouth/Parynx benign; Mallinpatti scale 3 Neck: No JVD, no carotid briuts Lungs: clear to ausculatation and percussion; no wheezing or rales Heart: RRR, s1 s2 normal 1/6 systolic Abdomen: Central adiposity soft, nontender; no hepatosplenomehaly, BS+; abdominal aorta nontender and not dilated by palpation. Pulses 2+ Extremities: Imporved edema; no clubbing cyanosis  Homan's sign negative  Neurologic: grossly nonfocal  ECG: Sinus rhythm at 58 beats per minute with right bundle branch block and nonspecific ST-T changes. Qtc 433 msec  LABS:  BMET    Component Value Date/Time   NA 137 10/04/2012 1140   K 4.4 10/04/2012 1140   CL 100 10/04/2012 1140   CO2 29 10/04/2012 1140   GLUCOSE 104* 10/04/2012 1140   BUN 10 10/04/2012 1140   CREATININE 0.89 10/04/2012 1140   CREATININE 1.02 09/05/2012 0458   CALCIUM 9.5 10/04/2012 1140   GFRNONAA 71* 09/05/2012 0458   GFRAA 83* 09/05/2012 0458     Hepatic Function Panel     Component Value Date/Time   PROT 7.2 10/04/2012 1140   ALBUMIN 3.6 10/04/2012 1140   AST 42* 10/04/2012 1140   ALT 61* 10/04/2012 1140    ALKPHOS 220* 10/04/2012 1140   BILITOT 0.6 10/04/2012 1140     CBC    Component Value Date/Time   WBC 10.4 10/04/2012 1140   RBC 4.57 10/04/2012 1140   HGB 13.0 10/04/2012 1140   HCT 39.9 10/04/2012 1140   PLT 320 10/04/2012 1140   MCV 87.3 10/04/2012 1140   MCH 28.4 10/04/2012 1140   MCHC 32.6 10/04/2012 1140   RDW 14.6 10/04/2012 1140   LYMPHSABS 1.4 08/29/2012 0613   MONOABS 1.5* 08/29/2012 0613   EOSABS 0.2 08/29/2012 0613   BASOSABS 0.1 08/29/2012 0613     BNP No results found for this basename: probnp    Lipid Panel     Component Value Date/Time   CHOL 123 10/04/2012 1140   TRIG 92 10/04/2012 1140   HDL 40  10/04/2012 1140   CHOLHDL 3.1 10/04/2012 1140   VLDL 18 10/04/2012 1140   LDLCALC 65 10/04/2012 1140     RADIOLOGY: No results found.    ASSESSMENT AND PLAN: Joseph Bryan is now 16 years status post his anterior wall myocardial infarction at which time he was treated with PTCA of a totally occluded LAD. Due to severe  left main stenosis he underwent CABG surgery with a LIMA to his LAD, vein to diagonal, and vein to the third marginal vessel. He has been documented have significant salvage of myocardium from his acute PTCA. His last nuclear perfusion study was in May 2010. On echo Doppler study which was done preoperatively his ejection fraction was considered normal and he did have mild to moderate TR. I have recommended that he can take an extra Lasix 20 mg on a when necessary basis for his intermittent leg swelling. Laboratory was reviewed LDL cholesterol 65 the total cholesterol 123 HDL 40. Since his been 4 years since his last nuclear perfusion study, in November 2014 I am recommending he undergo a LexiScan Myoview study. I will see him in December 2014 for followup evaluation and further recommendations will be made at that time.    Lennette Bihari, MD, [facility]  10/08/2012 7:38 PM

## 2012-10-08 NOTE — Progress Notes (Addendum)
Patient ID: [name], male   DOB: [birth year], 72 y.o.   MRN: [id number]     HPI: Joseph Bryan, is a 72 y.o. male who presents to the office today in followup of his recent [facility] where he underwent abdominal surgery.  Joseph Bryan history of known coronary artery disease and suffered an anterior wall myocardial infarction in November 1998 and underwent primary PTCA of the totally occluded proximal LAD done by me. Due to the concomitant high-grade distal left main stenosis ostial encroachment to a circumflex vessel following stabilization from his initial intervention menstruation of significant myocardial salvage he underwent elective CABG surgery (LIMA to LAD, vein to diagonal, vein to third marginal vessel). He did develop postoperative DVT for which he did require Coumadin therapy. His last catheterization was done in 2010 which showed patent grafts. He has a history of hyperlipidemia also peripheral neuropathy. He recently was admitted to [facility] with abdominal pain and we saw him for preoperative consultation. He did undergo a 2-D echo Doppler study which showed normal systolic function. There is evidence for mild/moderate tricuspid regurgitation. Mild pulmonary hypertension with a PA pressure 33 mm. From a cardiac standpoint, he ultimately tolerated his abdominal surgery where he was found to have colonic ischemia and underwent laparoscopic ileo-colectomy by Dr. Biagio Quint. He presents for evaluation.   He denies recent chest pain. He does have a history of sleep apnea and uses CPAP therapy 100% of the time. He has noticed increasing leg swelling which has not been improved with his Maxzide therapy.  Past Medical History  Diagnosis Date  . Coronary artery disease   . Myocardial infarction     Past Surgical History  Procedure Laterality Date  . Lung surgery      right and left lungs - "had air pockets"  . Coronary artery bypass graft    . Colon resection N/A  08/27/2012    Procedure: Diagnostic laparoscopy; Ileocecectomy;  Surgeon: Lodema Pilot, DO;  Location: [facility];  Service: General;  Laterality: N/A;    No Known Allergies  Current Outpatient Prescriptions  Medication Sig Dispense Refill  . atenolol (TENORMIN) 100 MG tablet Take 100 mg by mouth daily.      . Glucosamine-Chondroitin (OSTEO BI-FLEX REGULAR STRENGTH PO) Take 1 tablet by mouth.      Marland Kitchen HYDROcodone-acetaminophen (NORCO/VICODIN) 5-325 MG per tablet Take 0.5-2 tablets by mouth every 4 (four) hours as needed.  30 tablet  0  . lisinopril (PRINIVIL,ZESTRIL) 10 MG tablet Take 10 mg by mouth daily.      . vitamin B-12 (CYANOCOBALAMIN) 1000 MCG tablet Take 1,000 mcg by mouth daily.      . vitamin B-6 (PYRIDOXINE) 25 MG tablet Take 25 mg by mouth daily.      . feeding supplement (ENSURE COMPLETE) LIQD Take 237 mLs by mouth 2 (two) times daily between meals.  20 Bottle  1  . furosemide (LASIX) 20 MG tablet Take 40 mg (2 tablet) for 3 days then 20 mg daily  60 tablet  6  . simvastatin (ZOCOR) 40 MG tablet Take 1 tablet by mouth daily.       No current facility-administered medications for this visit.    History   Social History  . Marital Status: Married    Spouse Name: N/A    Number of Children: N/A  . Years of Education: N/A   Occupational History  . Not on file.   Social History Main Topics  . Smoking status: Former Smoker  Types: Cigarettes    Quit date: 08/25/1988  . Smokeless tobacco: Never Used  . Alcohol Use: No  . Drug Use: No  . Sexual Activity: Not on file   Other Topics Concern  . Not on file   Social History Narrative  . No narrative on file    Family History  Problem Relation Age of Onset  . Hypertension Father    He is married has 3 children 4 grandchildren 2 great-grandchildren. There is remote tobacco history. ROS is negative for fevers, chills or night sweats. He denies chest pain. He doesn't fatigue. At times his mild shortness of breath. He  admits to leg swelling. He denies tachycardia palpitations. He denies bleeding. He has been using CPAP with 100% compliance. He denies residual daytime sleepiness. He is unaware of breakthrough snoring.  Other system review is negative.  PE BP 110/70  Pulse 62  Ht 6\' 1"  (1.854 m)  Wt 293 lb 9.6 oz (133.176 kg)  BMI 38.74 kg/m2  General: Alert, oriented, no distress.  Skin: normal turgor, no rashes HEENT: Normocephalic, atraumatic. Pupils round and reactive; sclera anicteric;no lid lag.  Nose without nasal septal hypertrophy Mouth/Parynx benign; Mallinpatti scale 3 Neck: No JVD, no carotid briuts Lungs: clear to ausculatation and percussion; no wheezing or rales Heart: RRR, s1 s2 normal 1/6 systolic Abdomen: Central adiposity soft, nontender; no hepatosplenomehaly, BS+; abdominal aorta nontender and not dilated by palpation. Pulses 2+ Extremities: One plus edema; no clubbing cyanosis  Homan's sign negative  Neurologic: grossly nonfocal  ECG: Sinus rhythm with right bundle branch block and nonspecific ST-T changes  LABS:  BMET    Component Value Date/Time   NA 137 10/04/2012 1140   K 4.4 10/04/2012 1140   CL 100 10/04/2012 1140   CO2 29 10/04/2012 1140   GLUCOSE 104* 10/04/2012 1140   BUN 10 10/04/2012 1140   CREATININE 0.89 10/04/2012 1140   CREATININE 1.02 09/05/2012 0458   CALCIUM 9.5 10/04/2012 1140   GFRNONAA 71* 09/05/2012 0458   GFRAA 83* 09/05/2012 0458     Hepatic Function Panel     Component Value Date/Time   PROT 7.2 10/04/2012 1140   ALBUMIN 3.6 10/04/2012 1140   AST 42* 10/04/2012 1140   ALT 61* 10/04/2012 1140   ALKPHOS 220* 10/04/2012 1140   BILITOT 0.6 10/04/2012 1140     CBC    Component Value Date/Time   WBC 10.4 10/04/2012 1140   RBC 4.57 10/04/2012 1140   HGB 13.0 10/04/2012 1140   HCT 39.9 10/04/2012 1140   PLT 320 10/04/2012 1140   MCV 87.3 10/04/2012 1140   MCH 28.4 10/04/2012 1140   MCHC 32.6 10/04/2012 1140   RDW 14.6 10/04/2012 1140   LYMPHSABS 1.4  08/29/2012 0613   MONOABS 1.5* 08/29/2012 0613   EOSABS 0.2 08/29/2012 0613   BASOSABS 0.1 08/29/2012 0613     BNP No results found for this basename: probnp    Lipid Panel     Component Value Date/Time   CHOL 123 10/04/2012 1140   TRIG 92 10/04/2012 1140   HDL 40 10/04/2012 1140   CHOLHDL 3.1 10/04/2012 1140   VLDL 18 10/04/2012 1140   LDLCALC 65 10/04/2012 1140     RADIOLOGY: No results found.    ASSESSMENT AND PLAN:  Joseph Bryan is a 72 year old gentleman who suffered an anterior wall myocardial infarction in November 1998 which time he underwent PTCA the totally occluded proximal LAD. He underwent elective CABG surgery due to concomitant  high-grade disease including left main stenosis. He has been documented to have patent grafts as last catheterization in June 2010. 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 He tolerated surgery from a cardiovascular perspective. Presently, I am recommending he discontinue his Maxzide. I will change him to furosemide 40 mg daily for the next 3 days and then he'll take 20 mg a day. In approximately 3 weeks he'll undergo complete the laboratory incompetents metabolic panel, lipid panel and CBC. I'll see him in the office in followup in proximally 6-8 weeks and further recommendations will at that time.    Lennette Bihari, MD, [facility]  10/08/2012 7:23 PM

## 2012-10-25 ENCOUNTER — Telehealth: Payer: Self-pay | Admitting: *Deleted

## 2012-10-25 NOTE — Telephone Encounter (Signed)
Left message to return a call to me in reference to lab results. I will also try cell number on file.

## 2012-10-25 NOTE — Telephone Encounter (Signed)
Message copied by Gaynelle Cage on Mon Oct 25, 2012 11:24 AM ------      Message from: Nicki Guadalajara A      Created: Tue Oct 19, 2012  3:58 PM       Hold zocor; recheck LFT's in 2 weeks,  May consider lower dose or therapy change  if stabilizes                                      ------

## 2012-10-26 ENCOUNTER — Telehealth: Payer: Self-pay | Admitting: *Deleted

## 2012-10-26 NOTE — Telephone Encounter (Signed)
Left message with patient's wife to have patient to return a call to me. Name left for return call.

## 2012-10-27 ENCOUNTER — Telehealth: Payer: Self-pay | Admitting: Cardiovascular Disease

## 2012-10-27 NOTE — Telephone Encounter (Signed)
Mr.Bornemann is returning a call from [place]... Please Call    thanks

## 2012-10-27 NOTE — Telephone Encounter (Signed)
Message sent to Joseph Bryan

## 2012-10-28 ENCOUNTER — Telehealth: Payer: Self-pay | Admitting: *Deleted

## 2012-10-28 ENCOUNTER — Other Ambulatory Visit: Payer: Self-pay | Admitting: *Deleted

## 2012-10-28 NOTE — Telephone Encounter (Signed)
Informed patient per Dr. Tresa Endo to hold his simvastatin. Recheck LFT'S in 2 weeks. Order for hepatic function test paced in the computer.patient voiced his understanding of the order given not to take this medication for now.

## 2012-10-28 NOTE — Progress Notes (Signed)
Quick Note:  Spoke with patient gave results and recommendations. Lab slip mailed to patient to get 2 week follow up hepatic panel. ______

## 2012-11-13 LAB — HEPATIC FUNCTION PANEL
AST: 43 U/L — ABNORMAL HIGH (ref 0–37)
Bilirubin, Direct: 0.2 mg/dL (ref 0.0–0.3)
Total Bilirubin: 0.6 mg/dL (ref 0.3–1.2)

## 2012-11-16 ENCOUNTER — Ambulatory Visit (HOSPITAL_COMMUNITY)
Admission: RE | Admit: 2012-11-16 | Discharge: 2012-11-16 | Disposition: A | Discharge status: Home / Self Care | Payer: Medicare Other | Source: Ambulatory Visit | Attending: Internal Medicine | Admitting: Internal Medicine

## 2012-11-16 DIAGNOSIS — I2510 Atherosclerotic heart disease of native coronary artery without angina pectoris: Secondary | ICD-10-CM

## 2012-11-16 MED ORDER — TECHNETIUM TC 99M SESTAMIBI GENERIC - CARDIOLITE
10.0000 | Freq: Once | INTRAVENOUS | Status: AC | PRN
  Administered 2012-11-16: 10 via INTRAVENOUS

## 2012-11-16 MED ORDER — REGADENOSON 0.4 MG/5ML IV SOLN
0.4000 mg | Freq: Once | INTRAVENOUS | Status: AC
  Administered 2012-11-16: 0.4 mg via INTRAVENOUS

## 2012-11-16 MED ORDER — TECHNETIUM TC 99M SESTAMIBI GENERIC - CARDIOLITE
30.0000 | Freq: Once | INTRAVENOUS | Status: AC | PRN
  Administered 2012-11-16: 30 via INTRAVENOUS

## 2012-11-16 NOTE — Procedures (Addendum)
[facility] [address] [place] [address] [phone]  Cardiology Nuclear Med Study  CLOV[place] Joseph Bryan is a 72 y.o. male     MRN : [id number]     DOB: June 29, 1940  Procedure Date: 11/16/2012  Nuclear Med Background Indication for Stress Test:  Graft Patency, Stent Patency and Post Hospital History:  CAD;MI--11/1996;CABG X3--1998;STENT/PTCA--11/1996;ISCHEMIC CARDIOMYOPATHY Cardiac Risk Factors: Family History - CAD, History of Smoking, Hypertension, Lipids, Obesity, PVD and RBBB  Symptoms:  DOE   Nuclear Pre-Procedure Caffeine/Decaff Intake:  7:00pm NPO After: 5:00am   IV Site: R Hand  IV 0.9% NS with Angio Cath:  22g  Chest Size (in):  50"  IV Started by: Emmit Pomfret, RN  Height: 6\' 1"  (1.854 m)  Cup Size: n/a  BMI:  Body mass index is 38.53 kg/(m^2). Weight:  292 lb (132.45 kg)   Tech Comments:  N/A    Nuclear Med Study 1 or 2 day study: 1 day  Stress Test Type:  Lexiscan  Order Authorizing Provider:  Nicki Guadalajara, MD   Resting Radionuclide: Technetium 69m Sestamibi  Resting Radionuclide Dose: 10.8 mCi   Stress Radionuclide:  Technetium 62m Sestamibi  Stress Radionuclide Dose: 30.3 mCi           Stress Protocol Rest HR: 54 Stress HR: 60  Rest BP: 117/75 Stress BP: 122/83  Exercise Time (min): n/a METS: n/a   Predicted Max HR: 148 bpm % Max HR: 48.65 bpm Rate Pressure Product: 8784  Dose of Adenosine (mg):  n/a Dose of Lexiscan: 0.4 mg  Dose of Atropine (mg): n/a Dose of Dobutamine: n/a mcg/kg/min (at max HR)  Stress Test Technologist: Esperanza Sheets, CCT Nuclear Technologist: Koren Shiver, CNMT   Rest Procedure:  Myocardial perfusion imaging was performed at rest 45 minutes following the intravenous administration of Technetium 67m Sestamibi. Stress Procedure:  The patient received IV Lexiscan 0.4 mg over 15-seconds.  Technetium 13m Sestamibi injected at 30-seconds.  There were no significant changes  with Lexiscan.  Quantitative spect images were obtained after a 45 minute delay.  Transient Ischemic Dilatation (Normal <1.22):  1.11 Lung/Heart Ratio (Normal <0.45):  0.44 QGS EDV:  94 ml QGS ESV:  43 ml LV Ejection Fraction: 54%  Rest ECG: NSR with anteroseptal Q waves  Stress ECG: No significant change from baseline ECG  QPS Raw Data Images:  Normal; no motion artifact; normal heart/lung ratio. Stress Images:  Perfusion defect in the mid to distal anteroapical, apical and inferoapical walls Rest Images:  Perfusion defect in the mid to distal anteroapical, apical and inferoapical walls Subtraction (SDS):  No evidence of ischemia. SDS 3.  Impression Exercise Capacity:  Lexiscan with no exercise. BP Response:  Normal blood pressure response. Clinical Symptoms:  No significant symptoms noted. ECG Impression:  No significant ECG changes with Lexiscan. Comparison with Prior Nuclear Study: Prior NST in 2010 suggested anteroapical ischemia.  Overall Impression:  Low risk stress nuclear study with fixed inferoapical, apical and anteroapical defect consistent with distal LAD territory scar.  LV Wall Motion:  EF 54%, mild anteroapical hypokinesis  Chrystie Nose, MD, [facility] Board Certified in Nuclear Cardiology Attending Cardiologist [facility]  Chrystie Nose, MD  11/16/2012 1:09 PM

## 2012-11-22 ENCOUNTER — Encounter: Payer: Self-pay | Admitting: *Deleted

## 2012-11-22 NOTE — Progress Notes (Signed)
Quick Note:  Note sent to patient ______ 

## 2013-01-10 ENCOUNTER — Ambulatory Visit (INDEPENDENT_AMBULATORY_CARE_PROVIDER_SITE_OTHER): Payer: Medicare Other | Admitting: Cardiovascular Disease

## 2013-01-10 ENCOUNTER — Encounter: Payer: Self-pay | Admitting: Cardiovascular Disease

## 2013-01-10 VITALS — BP 132/76 | HR 67 | Ht 73.0 in | Wt 288.7 lb

## 2013-01-10 DIAGNOSIS — I4510 Unspecified right bundle-branch block: Secondary | ICD-10-CM

## 2013-01-10 DIAGNOSIS — G4733 Obstructive sleep apnea (adult) (pediatric): Secondary | ICD-10-CM

## 2013-01-10 DIAGNOSIS — E782 Mixed hyperlipidemia: Secondary | ICD-10-CM

## 2013-01-10 DIAGNOSIS — Z79899 Other long term (current) drug therapy: Secondary | ICD-10-CM

## 2013-01-10 DIAGNOSIS — I10 Essential (primary) hypertension: Secondary | ICD-10-CM

## 2013-01-10 DIAGNOSIS — I2510 Atherosclerotic heart disease of native coronary artery without angina pectoris: Secondary | ICD-10-CM

## 2013-01-10 DIAGNOSIS — I252 Old myocardial infarction: Secondary | ICD-10-CM

## 2013-01-10 NOTE — Patient Instructions (Signed)
LABS ;CMP, LIPID IN ONE WEEK   Your physician wants you to follow-up in 6 MONHTS Dr Tresa Endo  You will receive a reminder letter in the mail two months in advance. If you don't receive a letter, please call our office to schedule the follow-up appointment.

## 2013-01-12 ENCOUNTER — Encounter: Payer: Self-pay | Admitting: Cardiovascular Disease

## 2013-01-12 NOTE — Progress Notes (Signed)
Patient ID: Joseph Bryan, male   DOB: 10-18-1940, 72 y.o.   MRN: [id number]     HPI: Joseph Bryan, is a 72 y.o. male who presents to the office today for followup cardiology evaluation.  [name] as a history of known coronary artery disease . He suffered an anterior wall myocardial infarction in November 1998 and underwent primary PTCA of the totally occluded proximal LAD done by me. Due to the concomitant high-grade distal left main stenosis with ostial encroachment to a circumflex vessel following stabilization from his initial intervention and significant myocardial salvage he underwent elective CABG surgery (LIMA to LAD, vein to diagonal, vein to third marginal vessel). He did develop postoperative DVT for which he did require Coumadin therapy. His last catheterization was done in 2010 which showed patent grafts. He has a history of hyperlipidemia also peripheral neuropathy. He recently was admitted to [facility] with abdominal pain and I saw him for preoperative preoperative evaluation.  A preoperative 2-D echo Doppler study showed normal systolic function with mild/moderate tricuspid regurgitation. Mild pulmonary hypertension with a PA pressure 33 mm. He was found to have colonic ischemia with presumed appendix inflammation.  From a cardiac standpoint, he ultimately tolerated his abdominal surgery where he was found to have colonic ischemia and underwent laparoscopic ileo-colectomy by Dr. Biagio Quint.   Additional problems include  a history of sleep apnea and uses CPAP therapy 100% of the time. He has noticed increasing leg swelling which has not been improved with his Maxzide therapy. When Iinitially saw him following his surgery, I discontinued his Maxzide and just recommended initiation of furosemide therapy at 40 mg for 3 days then 20 mg daily. This improved his leg swelling but he still notes some occasional swelling. 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   Since I last saw him in September, [name] has felt well he did have elevation of liver function studies and has been off statin therapy. Subsequent blood work at the end of October showed an alkaline phosphatase of 159 improved from 220, ALT 36, improved from 61, and AST still minimally elevated at 43. He denies recent chest pressure. He denies palpitations. He denies PND or orthopnea. He did undergo a recent nuclear perfusion which was done on 11/16/2012. This revealed a perfusion defect in the mid to distal anteroapical, apical, inferoapical walls consistent with distal LAD territory scar. Ejection fraction was 54% there was mild anteroapical hypokinesis. This was not significantly changed from his last nuclear study in 2010. He presents now for evaluation.  Past Medical History  Diagnosis Date  . Coronary artery disease   . Myocardial infarction     Past Surgical History  Procedure Laterality Date  . Lung surgery      right and left lungs - "had air pockets"  . Coronary artery bypass graft    . Colon resection N/A 08/27/2012    Procedure: Diagnostic laparoscopy; Ileocecectomy;  Surgeon: Lodema Pilot, DO;  Location: [facility];  Service: General;  Laterality: N/A;    No Known Allergies  Current Outpatient Prescriptions  Medication Sig Dispense Refill  . atenolol (TENORMIN) 100 MG tablet Take 100 mg by mouth daily.      . feeding supplement (ENSURE COMPLETE) LIQD Take 237 mLs by mouth 2 (two) times daily between meals.  20 Bottle  1  . furosemide (LASIX) 20 MG tablet Take 20 mg  by mouth daily.      . Glucosamine-Chondroitin (OSTEO BI-FLEX REGULAR STRENGTH PO) Take 1 tablet by mouth.      Marland Kitchen lisinopril (PRINIVIL,ZESTRIL) 10 MG tablet Take 10 mg by mouth daily.      . vitamin B-12 (CYANOCOBALAMIN) 1000 MCG tablet Take 1,000 mcg by mouth daily.      . vitamin B-6 (PYRIDOXINE) 25 MG tablet Take 25 mg by mouth daily.       No current  facility-administered medications for this visit.    History   Social History  . Marital Status: Married    Spouse Name: N/A    Number of Children: N/A  . Years of Education: N/A   Occupational History  . Not on file.   Social History Main Topics  . Smoking status: Former Smoker    Types: Cigarettes    Quit date: 08/25/1988  . Smokeless tobacco: Never Used  . Alcohol Use: No  . Drug Use: No  . Sexual Activity: Not on file   Other Topics Concern  . Not on file   Social History Narrative  . No narrative on file    Family History  Problem Relation Age of Onset  . Hypertension Father    He is married has 3 children 4 grandchildren 2 great-grandchildren. There is remote tobacco history.  ROS is negative for fevers, chills or night sweats. He does note skin dryness. He denies change in vision or hearing. He denies headaches. He denies cough or wheezing. There is no PND or orthopnea. He denies presyncope or syncope. He denies chest tightness. At times there is a mild shortness of breath. He denies any recurrent abdominal pain. There is no blood in stool or urine. He denies nausea vomiting or diarrhea. He did have some mild myalgias previously on simvastatin but these resolved with discontinuance of his statin therapy. There is no diabetes he denies significant recurrent edema. 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 He is unaware of snoring. There is no diabetes. He does have some mild musculoskeletal symptoms. Other comprehensive 14 point system review is negative.  PE BP 132/76  Pulse 67  Ht 6\' 1"  (1.854 m)  Wt 288 lb 11.2 oz (130.953 kg)  BMI 38.10 kg/m2  General: Alert, oriented, no distress.  Skin: normal turgor, suggestive of mild seborrheic dermatitis on his face. HEENT: Normocephalic, atraumatic. Pupils round and reactive; sclera anicteric;no lid lag.  Nose without nasal septal hypertrophy Mouth/Parynx benign; Mallinpatti  scale 3 Neck: No JVD, no carotid briuts; normal carotid upstroke Lungs: clear to ausculatation and percussion; no wheezing or rales Chest wall without tenderness to palpation Heart: RRR, s1 s2 normal 1/6 systolic Abdomen: Central adiposity soft, nontender; no hepatosplenomehaly, BS+; abdominal aorta nontender and not dilated by palpation. Back: No CVA tenderness Pulses 2+ Extremities: Imporved edema; no clubbing cyanosis  Homan's sign negative  Neurologic: grossly nonfocal Psychological: Normal affect and mood; normal cognition  ECG: Sinus rhythm at 67 beats per minute with right bundle branch block and nonspecific ST-T changes. Qtc 464 msec  LABS:  BMET    Component Value Date/Time   NA 137 10/04/2012 1140   K 4.4 10/04/2012 1140   CL 100 10/04/2012 1140   CO2 29 10/04/2012 1140   GLUCOSE 104* 10/04/2012 1140   BUN 10 10/04/2012 1140   CREATININE 0.89 10/04/2012 1140   CREATININE 1.02 09/05/2012 0458   CALCIUM 9.5 10/04/2012 1140   GFRNONAA 71* 09/05/2012 0458  GFRAA 83* 09/05/2012 0458     Hepatic Function Panel     Component Value Date/Time   PROT 7.1 11/12/2012 1333   ALBUMIN 3.3* 11/12/2012 1333   AST 43* 11/12/2012 1333   ALT 36 11/12/2012 1333   ALKPHOS 159* 11/12/2012 1333   BILITOT 0.6 11/12/2012 1333   BILIDIR 0.2 11/12/2012 1333   IBILI 0.4 11/12/2012 1333     CBC    Component Value Date/Time   WBC 10.4 10/04/2012 1140   RBC 4.57 10/04/2012 1140   HGB 13.0 10/04/2012 1140   HCT 39.9 10/04/2012 1140   PLT 320 10/04/2012 1140   MCV 87.3 10/04/2012 1140   MCH 28.4 10/04/2012 1140   MCHC 32.6 10/04/2012 1140   RDW 14.6 10/04/2012 1140   LYMPHSABS 1.4 08/29/2012 0613   MONOABS 1.5* 08/29/2012 0613   EOSABS 0.2 08/29/2012 0613   BASOSABS 0.1 08/29/2012 0613     BNP No results found for this basename: probnp    Lipid Panel     Component Value Date/Time   CHOL 123 10/04/2012 1140   TRIG 92 10/04/2012 1140   HDL 40 10/04/2012 1140   CHOLHDL 3.1 10/04/2012 1140    VLDL 18 10/04/2012 1140   LDLCALC 65 10/04/2012 1140     RADIOLOGY: No results found.    ASSESSMENT AND PLAN: [name] is 16 years status post his anterior wall myocardial infarction at which time he was treated with PTCA of a totally occluded LAD. Due to severe  left main stenosis he underwent CABG surgery with a LIMA to his LAD, vein to diagonal, and vein to the third marginal vessel. He has been documented have significant salvage of myocardium from his acute PTCA. An echo Doppler study which was done preoperatively showed a normal  ejection fraction  and he did have mild to moderate TR. He recently had a 4-1/2 year followup nuclear perfusion study. This again shows distal LAD territory scar concordant with his prior myocardial infarction. Ejection fraction was 54%. There was no region of ischemia. This essentially is unchanged from his previous study in 2010. He now has been off statin therapy for several months. I am recommending that he obtain a followup CMET and lipid panel to reassess liver function studies and to make certain he does not have significant re elevation of lipid status. Adjustments may need to be made depending upon the results. He may also benefit from topical hydrocortisone cream for seborrheic dermatitis they'll defer this to his primary physician. Clinically, he does not have any heart failure symptoms. His right bundle branch block is chronic. He continues he is to  utilize CPAP therapy for his obstructive sleep apnea.  I will see him in 6 months for followup evaluation.  Time spent: 25 min  Lennette Bihari, MD, [facility]  01/12/2013 10:31 AM

## 2013-01-25 ENCOUNTER — Encounter: Payer: Self-pay | Admitting: Cardiovascular Disease

## 2013-03-04 ENCOUNTER — Telehealth: Payer: Self-pay | Admitting: Oncology

## 2013-03-04 NOTE — Telephone Encounter (Signed)
Talked to pt and gave him appt for March  r/s appt due to MD's PAL

## 2013-03-21 ENCOUNTER — Ambulatory Visit: Payer: Medicare Other | Admitting: Oncology

## 2013-03-21 ENCOUNTER — Other Ambulatory Visit: Payer: Medicare Other

## 2013-03-28 ENCOUNTER — Other Ambulatory Visit: Payer: Medicare Other

## 2013-03-28 ENCOUNTER — Ambulatory Visit: Payer: Medicare Other | Admitting: Nurse Practitioner

## 2013-11-17 ENCOUNTER — Other Ambulatory Visit: Payer: Self-pay | Admitting: Cardiovascular Disease

## 2013-11-17 MED ORDER — FUROSEMIDE 20 MG PO TABS: 20.0000 mg | ORAL_TABLET | Freq: Every day | ORAL | Status: DC

## 2013-11-17 NOTE — Telephone Encounter (Signed)
Pt says he is still waiting on his Furosemide. Please call it in today to [facility] [phone].

## 2013-11-17 NOTE — Telephone Encounter (Signed)
Informed the patient that his prescription for furosemide has been sent in to [facility]. Advised pt that he has 3 refills and then he will need to schedule an appt before receiving more. He said he will call back and make an appt with Dr. Claiborne Billings.

## 2013-11-22 ENCOUNTER — Telehealth: Payer: Self-pay | Admitting: Cardiovascular Disease

## 2013-11-23 NOTE — Telephone Encounter (Signed)
Closed Encounter  °

## 2013-12-15 ENCOUNTER — Encounter: Payer: Self-pay | Admitting: Cardiovascular Disease

## 2013-12-15 ENCOUNTER — Ambulatory Visit (INDEPENDENT_AMBULATORY_CARE_PROVIDER_SITE_OTHER): Payer: Medicare Other | Admitting: Cardiovascular Disease

## 2013-12-15 VITALS — BP 120/80 | HR 56 | Ht 73.0 in | Wt 304.4 lb

## 2013-12-15 DIAGNOSIS — I4510 Unspecified right bundle-branch block: Secondary | ICD-10-CM

## 2013-12-15 DIAGNOSIS — G4733 Obstructive sleep apnea (adult) (pediatric): Secondary | ICD-10-CM

## 2013-12-15 DIAGNOSIS — I252 Old myocardial infarction: Secondary | ICD-10-CM

## 2013-12-15 DIAGNOSIS — I10 Essential (primary) hypertension: Secondary | ICD-10-CM

## 2013-12-15 DIAGNOSIS — Z79899 Other long term (current) drug therapy: Secondary | ICD-10-CM

## 2013-12-15 DIAGNOSIS — I2510 Atherosclerotic heart disease of native coronary artery without angina pectoris: Secondary | ICD-10-CM

## 2013-12-15 DIAGNOSIS — R600 Localized edema: Secondary | ICD-10-CM

## 2013-12-15 DIAGNOSIS — Z9989 Dependence on other enabling machines and devices: Secondary | ICD-10-CM

## 2013-12-15 MED ORDER — FUROSEMIDE 40 MG PO TABS: 40.0000 mg | ORAL_TABLET | Freq: Every day | ORAL | Status: DC

## 2013-12-15 NOTE — Patient Instructions (Signed)
Your physician has recommended you make the following change in your medication: furosemide (fluid pill) was increased to 40 mg daily.  Your physician recommends that you return for lab work fasting.  Your physician wants you to follow-up in: 1 year or sooner if needed You will receive a reminder letter in the mail two months in advance. If you don't receive a letter, please call our office to schedule the follow-up appointment.

## 2013-12-15 NOTE — Progress Notes (Signed)
Patient ID: Joseph Bryan, male   DOB: 1940/06/07, 73 y.o.   MRN: [id number]     HPI: Joseph Bryan is a 73 y.o. male who presents to the office today for a one year followup cardiology evaluation.  [name] as a history of known coronary artery disease . He suffered an anterior wall myocardial infarction in November 1998 and underwent primary PTCA of the totally occluded proximal LAD done by me. Due to the concomitant high-grade distal left main stenosis with ostial encroachment to a circumflex vessel following stabilization from his initial intervention and significant myocardial salvage he underwent elective CABG surgery (LIMA to LAD, vein to diagonal, vein to third marginal vessel). He did develop postoperative DVT for which he did require Coumadin therapy. His last catheterization in 2010 showed patent grafts. He has a history of hyperlipidemia also peripheral neuropathy. Last year he was admitted to [facility] long hospital with abdominal pain and I saw him for preoperative preoperative evaluation.  A preoperative 2-D echo Doppler study showed normal systolic function with mild/moderate tricuspid regurgitation. Mild pulmonary hypertension with a PA pressure 33 mm. He was found to have colonic ischemia with presumed appendix inflammation.  From a cardiac standpoint, he ultimately tolerated his abdominal surgery where he was found to have colonic ischemia and underwent laparoscopic ileo-colectomy by Dr. [name].   A nuclear perfusion which on 11/16/2012 revealed a perfusion defect in the mid to distal anteroapical, apical, inferoapical walls consistent with distal LAD territory scar. Ejection fraction was 54% there was mild anteroapical hypokinesis. This was not significantly changed from his last nuclear study in 2010.  Additional problems include  a history of sleep apnea and uses CPAP therapy 100% of the time.  He states that recently he is in need for new mask.  He uses [employer] for his [employer]  company.  He has a history of mild LFT elevation in the past.  He admits to daily leg swelling.  He has been on furosemide 20 mg and states that when he wakes up.  His edema is gone, but by noon it has returned despite taking his 20 mg dosing.  He is unaware of any palpitations on his current dose of atenolol 100 mg daily.  He is unaware of any blood pressure exacerbation with also lisinopril in addition to the furosemide and atenolol.  He is on simvastatin 40 mg for hyperlipidemia.  He presents for one-year evaluation.  Past Medical History  Diagnosis Date  . Coronary artery disease   . Myocardial infarction     Past Surgical History  Procedure Laterality Date  . Lung surgery      right and left lungs - "had air pockets"  . Coronary artery bypass graft    . Colon resection N/A 08/27/2012    Procedure: Diagnostic laparoscopy; Ileocecectomy;  Surgeon: Madilyn Hook, DO;  Location: [facility];  Service: General;  Laterality: N/A;    No Known Allergies  Current Outpatient Prescriptions  Medication Sig Dispense Refill  . Aspirin (ECOTRIN PO) Take 650 mg/day by mouth daily.    Marland Kitchen atenolol (TENORMIN) 100 MG tablet Take 100 mg by mouth daily.    Raelyn Ensign Pollen 580 MG CAPS Take 2 capsules by mouth daily.    Marland Kitchen FLUZONE HIGH-DOSE 0.5 ML SUSY   0  . furosemide (LASIX) 20 MG tablet Take 1 tablet (20 mg total) by mouth daily. (Patient taking differently: Take 20 mg by mouth daily. Pt NEEDS APPT BEFORE REFILLING MEDICATION AGAIN;  AS OF 11/17/13.) 30 tablet 3  . Glucosamine-Chondroitin (OSTEO BI-FLEX REGULAR STRENGTH PO) Take 1 capsule by mouth daily.    Marland Kitchen lisinopril (PRINIVIL,ZESTRIL) 10 MG tablet Take 10 mg by mouth daily.    . simvastatin (ZOCOR) 40 MG tablet Take 40 mg by mouth daily.    . vitamin B-12 (CYANOCOBALAMIN) 1000 MCG tablet Take 1,000 mcg by mouth daily.    . vitamin B-6 (PYRIDOXINE) 25 MG tablet Take 25 mg by mouth daily.     No current facility-administered medications for this visit.     History   Social History  . Marital Status: Married    Spouse Name: N/A    Number of Children: N/A  . Years of Education: N/A   Occupational History  . Not on file.   Social History Main Topics  . Smoking status: Former Smoker    Types: Cigarettes    Quit date: 08/25/1988  . Smokeless tobacco: Never Used  . Alcohol Use: No  . Drug Use: No  . Sexual Activity: Not on file   Other Topics Concern  . Not on file   Social History Narrative    Family History  Problem Relation Age of Onset  . Hypertension Father    He is married has 3 children 4 grandchildren 2 great-grandchildren. There is remote tobacco history.  ROS General: Negative; No fevers, chills, or night sweats;  HEENT: Negative; No changes in vision or hearing, sinus congestion, difficulty swallowing Pulmonary: Negative; No cough, wheezing, shortness of breath, hemoptysis Cardiovascular: Negative; No chest pain, presyncope, syncope, palpitations Positive for leg swelling GI: Negative; No nausea, vomiting, diarrhea, or abdominal pain GU: Negative; No dysuria, hematuria, or difficulty voiding Musculoskeletal: Negative; no myalgias, joint pain, or weakness Hematologic/Oncology: Negative; no easy bruising, bleeding Endocrine: Negative; no heat/cold intolerance; no diabetes Neuro: Negative; no changes in balance, headaches Skin: Negative; No rashes or skin lesions Psychiatric: Negative; No behavioral problems, depression Sleep: Positive for obstructive sleep apnea on CPAP therapy.  No snoring, daytime sleepiness, hypersomnolence, bruxism, restless legs, hypnogognic hallucinations, no cataplexy Other comprehensive 14 point system review is negative.   PE BP 120/80 mmHg  Pulse 56  Ht 6\' 1"  (1.854 m)  Wt 304 lb 6.4 oz (138.075 kg)  BMI 40.17 kg/m2  General: Alert, oriented, no distress.  Skin: normal turgor, suggestive of mild seborrheic dermatitis on his face. HEENT: Normocephalic, atraumatic. Pupils round  and reactive; sclera anicteric;no lid lag.  Nose without nasal septal hypertrophy Mouth/Parynx benign; Mallinpatti scale 3 Neck: Thick neck No JVD, no carotid bruitss; normal carotid upstroke Lungs: clear to ausculatation and percussion; no wheezing or rales Chest wall without tenderness to palpation Heart: RRR, s1 s2 normal 1/6 systolic murmur, no S3 gallop.  No diastolic murmur.  No rubs thrills or heaves. Abdomen: Central adiposity soft, nontender; no hepatosplenomehaly, BS+; abdominal aorta nontender and not dilated by palpation. Back: No CVA tenderness Pulses 2+ Extremities: 1+ pretibial and ankle edema bilaterally; no clubbing cyanosis  Homan's sign negative  Neurologic: grossly nonfocal Psychological: Normal affect and mood; normal cognition  ECG (independently read by me): Sinus bradycardia 56 bpm.  Right bundle branch block with repolarization changes.  Normal intervals.  December 2014 ECG: Sinus rhythm at 67 beats per minute with right bundle branch block and nonspecific ST-T changes. Qtc 464 msec  LABS:  BMET    Component Value Date/Time   NA 137 10/04/2012 1140   K 4.4 10/04/2012 1140   CL 100 10/04/2012 1140   CO2 29  10/04/2012 1140   GLUCOSE 104* 10/04/2012 1140   BUN 10 10/04/2012 1140   CREATININE 0.89 10/04/2012 1140   CREATININE 1.02 09/05/2012 0458   CALCIUM 9.5 10/04/2012 1140   GFRNONAA 71* 09/05/2012 0458   GFRAA 83* 09/05/2012 0458     Hepatic Function Panel     Component Value Date/Time   PROT 7.1 11/12/2012 1333   ALBUMIN 3.3* 11/12/2012 1333   AST 43* 11/12/2012 1333   ALT 36 11/12/2012 1333   ALKPHOS 159* 11/12/2012 1333   BILITOT 0.6 11/12/2012 1333   BILIDIR 0.2 11/12/2012 1333   IBILI 0.4 11/12/2012 1333     CBC    Component Value Date/Time   WBC 10.4 10/04/2012 1140   RBC 4.57 10/04/2012 1140   HGB 13.0 10/04/2012 1140   HCT 39.9 10/04/2012 1140   PLT 320 10/04/2012 1140   MCV 87.3 10/04/2012 1140   MCH 28.4 10/04/2012 1140    MCHC 32.6 10/04/2012 1140   RDW 14.6 10/04/2012 1140   LYMPHSABS 1.4 08/29/2012 0613   MONOABS 1.5* 08/29/2012 0613   EOSABS 0.2 08/29/2012 0613   BASOSABS 0.1 08/29/2012 0613     BNP No results found for: PROBNP  Lipid Panel     Component Value Date/Time   CHOL 123 10/04/2012 1140   TRIG 92 10/04/2012 1140   HDL 40 10/04/2012 1140   CHOLHDL 3.1 10/04/2012 1140   VLDL 18 10/04/2012 1140   LDLCALC 65 10/04/2012 1140     RADIOLOGY: No results found.    ASSESSMENT AND PLAN: [name] is 17 years status post suffering an anterior wall myocardial infarction at which time he was treated with PTCA of a totally occluded LAD. Due to severe  left main stenosis he underwent CABG surgery with a LIMA to his LAD, vein to diagonal, and vein to the third marginal vessel. He has been documented have significant salvage of myocardium from his acute PTCA. An echo Doppler study which was done preoperatively showed a normal  ejection fraction  and he did have mild to moderate TR.  His last nuclear perfusion study done last year shows distal LAD territory scar concordant with his prior myocardial infarction. Ejection fraction was 54%. There was no region of ischemia. This essentially is unchanged from his previous study in 2010.  With reference to his CAD, he is without chest pain.  His blood pressure is controlled on lisinopril 10 mg furosemide 20 mg and atenolol 100 mg.  However, he continues to have significant lower exterminate edema.  I will further titrate the Lasix to 40 mg daily.  He is morbidly obese.  He has a 16 pound waking.  Compared to last evaluation his weight increased from 288 to 304 pounds today.  I discussed the importance of sodium restriction as well as weight loss and exercise.  He is back on statin therapy.  A complete set of laboratory will need to be done.  Remotely had experience LFT elevations.  He is using CPAP with 100% compliance.  He may be having to high humidity and we  discussed this today to try to reduce his humidity level.  We also discussed the importance of the cushion of his mask which needs to be replaced more frequently than the actual mask itself.  He will follow-up with [facility] for his supplies.  I will see him in one year for cardiology reevaluation.  Time spent: 25 min  Troy Sine, MD, [facility]  12/15/2013 3:29 PM

## 2013-12-21 LAB — COMPREHENSIVE METABOLIC PANEL
ALBUMIN: 3.8 g/dL (ref 3.5–5.2)
ALT: 40 U/L (ref 0–53)
AST: 41 U/L — ABNORMAL HIGH (ref 0–37)
Alkaline Phosphatase: 133 U/L — ABNORMAL HIGH (ref 39–117)
BILIRUBIN TOTAL: 0.7 mg/dL (ref 0.2–1.2)
BUN: 12 mg/dL (ref 6–23)
CO2: 28 meq/L (ref 19–32)
Calcium: 9.6 mg/dL (ref 8.4–10.5)
Chloride: 103 mEq/L (ref 96–112)
Creat: 0.83 mg/dL (ref 0.50–1.35)
GLUCOSE: 104 mg/dL — AB (ref 70–99)
POTASSIUM: 4.2 meq/L (ref 3.5–5.3)
Sodium: 141 mEq/L (ref 135–145)
TOTAL PROTEIN: 7.4 g/dL (ref 6.0–8.3)

## 2013-12-21 LAB — CBC
HEMATOCRIT: 42.6 % (ref 39.0–52.0)
Hemoglobin: 13.9 g/dL (ref 13.0–17.0)
MCH: 29.6 pg (ref 26.0–34.0)
MCHC: 32.6 g/dL (ref 30.0–36.0)
MCV: 90.8 fL (ref 78.0–100.0)
MPV: 10.8 fL (ref 9.4–12.4)
Platelets: 248 10*3/uL (ref 150–400)
RBC: 4.69 MIL/uL (ref 4.22–5.81)
RDW: 14.4 % (ref 11.5–15.5)
WBC: 8.5 10*3/uL (ref 4.0–10.5)

## 2013-12-21 LAB — LIPID PANEL
Cholesterol: 132 mg/dL (ref 0–200)
HDL: 46 mg/dL (ref >39)
LDL Cholesterol: 68 mg/dL (ref 0–99)
TRIGLYCERIDES: 92 mg/dL (ref <150)
Total CHOL/HDL Ratio: 2.9 Ratio
VLDL: 18 mg/dL (ref 0–40)

## 2013-12-21 LAB — TSH: TSH: 2.751 u[IU]/mL (ref 0.350–4.500)

## 2014-01-03 ENCOUNTER — Encounter: Payer: Self-pay | Admitting: *Deleted

## 2014-02-09 ENCOUNTER — Other Ambulatory Visit: Payer: Self-pay | Admitting: Family Medicine

## 2014-02-09 ENCOUNTER — Ambulatory Visit
Admission: RE | Admit: 2014-02-09 | Discharge: 2014-02-09 | Disposition: A | Discharge status: Home / Self Care | Payer: Medicare Other | Source: Ambulatory Visit | Attending: Family Medicine | Admitting: Family Medicine

## 2014-02-09 DIAGNOSIS — M5418 Radiculopathy, sacral and sacrococcygeal region: Secondary | ICD-10-CM

## 2014-02-15 ENCOUNTER — Other Ambulatory Visit: Payer: Self-pay | Admitting: Family Medicine

## 2014-02-15 ENCOUNTER — Ambulatory Visit
Admission: RE | Admit: 2014-02-15 | Discharge: 2014-02-15 | Disposition: A | Discharge status: Home / Self Care | Payer: Medicare Other | Source: Ambulatory Visit | Attending: Family Medicine | Admitting: Family Medicine

## 2014-02-15 DIAGNOSIS — R52 Pain, unspecified: Secondary | ICD-10-CM

## 2014-03-17 ENCOUNTER — Other Ambulatory Visit: Payer: Self-pay | Admitting: Family Medicine

## 2014-03-17 DIAGNOSIS — M549 Dorsalgia, unspecified: Secondary | ICD-10-CM

## 2014-03-17 DIAGNOSIS — Z139 Encounter for screening, unspecified: Secondary | ICD-10-CM

## 2014-04-06 ENCOUNTER — Ambulatory Visit
Admission: RE | Admit: 2014-04-06 | Discharge: 2014-04-06 | Disposition: A | Discharge status: Home / Self Care | Payer: Medicare Other | Source: Ambulatory Visit | Attending: Family Medicine | Admitting: Family Medicine

## 2014-04-06 DIAGNOSIS — Z139 Encounter for screening, unspecified: Secondary | ICD-10-CM

## 2014-04-06 DIAGNOSIS — M549 Dorsalgia, unspecified: Secondary | ICD-10-CM

## 2014-05-24 ENCOUNTER — Other Ambulatory Visit: Payer: Self-pay | Admitting: Neurosurgery

## 2014-05-24 DIAGNOSIS — M48061 Spinal stenosis, lumbar region without neurogenic claudication: Secondary | ICD-10-CM

## 2014-05-25 ENCOUNTER — Ambulatory Visit
Admission: RE | Admit: 2014-05-25 | Discharge: 2014-05-25 | Disposition: A | Discharge status: Home / Self Care | Payer: Medicare Other | Source: Ambulatory Visit | Attending: Neurosurgery | Admitting: Neurosurgery

## 2014-05-25 DIAGNOSIS — M48061 Spinal stenosis, lumbar region without neurogenic claudication: Secondary | ICD-10-CM

## 2014-05-25 MED ORDER — METHYLPREDNISOLONE ACETATE 40 MG/ML INJ SUSP (RADIOLOG
120.0000 mg | Freq: Once | INTRAMUSCULAR | Status: AC
  Administered 2014-05-25: 120 mg via EPIDURAL

## 2014-05-25 MED ORDER — IOHEXOL 180 MG/ML  SOLN
1.0000 mL | Freq: Once | INTRAMUSCULAR | Status: AC | PRN
  Administered 2014-05-25: 1 mL via EPIDURAL

## 2014-05-25 NOTE — Discharge Instructions (Signed)

## 2014-06-12 ENCOUNTER — Other Ambulatory Visit (HOSPITAL_COMMUNITY): Payer: Self-pay | Admitting: Family Medicine

## 2014-06-12 ENCOUNTER — Ambulatory Visit (HOSPITAL_COMMUNITY)
Admission: RE | Admit: 2014-06-12 | Discharge: 2014-06-12 | Disposition: A | Discharge status: Home / Self Care | Payer: Medicare Other | Source: Ambulatory Visit | Attending: Family Medicine | Admitting: Family Medicine

## 2014-06-12 DIAGNOSIS — M7989 Other specified soft tissue disorders: Secondary | ICD-10-CM

## 2014-06-12 DIAGNOSIS — M79662 Pain in left lower leg: Secondary | ICD-10-CM

## 2014-06-12 DIAGNOSIS — M79661 Pain in right lower leg: Secondary | ICD-10-CM

## 2014-06-12 NOTE — Progress Notes (Signed)
*  PRELIMINARY RESULTS* Vascular Ultrasound Right lower extremity venous duplex has been completed.  Preliminary findings: negative for DVT.  Called results to Dr. Arelia Sneddon.   Landry Mellow, RDMS, RVT  06/12/2014, 2:30 PM

## 2014-07-11 ENCOUNTER — Other Ambulatory Visit: Payer: Self-pay | Admitting: Family Medicine

## 2014-07-11 DIAGNOSIS — M48061 Spinal stenosis, lumbar region without neurogenic claudication: Secondary | ICD-10-CM

## 2014-07-12 ENCOUNTER — Ambulatory Visit
Admission: RE | Admit: 2014-07-12 | Discharge: 2014-07-12 | Disposition: A | Discharge status: Home / Self Care | Payer: Medicare Other | Source: Ambulatory Visit | Attending: Family Medicine | Admitting: Family Medicine

## 2014-07-12 DIAGNOSIS — M48061 Spinal stenosis, lumbar region without neurogenic claudication: Secondary | ICD-10-CM

## 2014-07-12 MED ORDER — IOHEXOL 180 MG/ML  SOLN
1.0000 mL | Freq: Once | INTRAMUSCULAR | Status: AC | PRN
  Administered 2014-07-12: 1 mL via EPIDURAL

## 2014-07-12 MED ORDER — METHYLPREDNISOLONE ACETATE 40 MG/ML INJ SUSP (RADIOLOG
120.0000 mg | Freq: Once | INTRAMUSCULAR | Status: AC
  Administered 2014-07-12: 120 mg via EPIDURAL

## 2014-09-27 ENCOUNTER — Other Ambulatory Visit: Payer: Self-pay | Admitting: Family Medicine

## 2014-09-27 DIAGNOSIS — M48061 Spinal stenosis, lumbar region without neurogenic claudication: Secondary | ICD-10-CM

## 2014-10-02 ENCOUNTER — Ambulatory Visit
Admission: RE | Admit: 2014-10-02 | Discharge: 2014-10-02 | Disposition: A | Discharge status: Home / Self Care | Payer: Medicare Other | Source: Ambulatory Visit | Attending: Family Medicine | Admitting: Family Medicine

## 2014-10-02 DIAGNOSIS — M48061 Spinal stenosis, lumbar region without neurogenic claudication: Secondary | ICD-10-CM

## 2014-10-02 MED ORDER — METHYLPREDNISOLONE ACETATE 40 MG/ML INJ SUSP (RADIOLOG
120.0000 mg | Freq: Once | INTRAMUSCULAR | Status: AC
  Administered 2014-10-02: 120 mg via EPIDURAL

## 2014-10-02 MED ORDER — IOHEXOL 180 MG/ML  SOLN
1.0000 mL | Freq: Once | INTRAMUSCULAR | Status: DC | PRN
  Administered 2014-10-02: 1 mL via EPIDURAL

## 2014-10-03 ENCOUNTER — Inpatient Hospital Stay: Admission: RE | Admit: 2014-10-03 | Payer: Medicare Other | Source: Ambulatory Visit

## 2014-12-12 ENCOUNTER — Other Ambulatory Visit: Payer: Self-pay | Admitting: Family Medicine

## 2014-12-12 DIAGNOSIS — M48061 Spinal stenosis, lumbar region without neurogenic claudication: Secondary | ICD-10-CM

## 2014-12-20 ENCOUNTER — Ambulatory Visit
Admission: RE | Admit: 2014-12-20 | Discharge: 2014-12-20 | Disposition: A | Discharge status: Home / Self Care | Payer: Medicare Other | Source: Ambulatory Visit | Attending: Family Medicine | Admitting: Family Medicine

## 2014-12-20 DIAGNOSIS — M48061 Spinal stenosis, lumbar region without neurogenic claudication: Secondary | ICD-10-CM

## 2014-12-20 MED ORDER — METHYLPREDNISOLONE ACETATE 40 MG/ML INJ SUSP (RADIOLOG
120.0000 mg | Freq: Once | INTRAMUSCULAR | Status: AC
  Administered 2014-12-20: 120 mg via EPIDURAL

## 2014-12-20 MED ORDER — IOHEXOL 180 MG/ML  SOLN
1.0000 mL | Freq: Once | INTRAMUSCULAR | Status: AC | PRN
  Administered 2014-12-20: 1 mL via EPIDURAL

## 2014-12-21 ENCOUNTER — Ambulatory Visit: Payer: Medicare Other | Admitting: Cardiovascular Disease

## 2015-02-15 ENCOUNTER — Other Ambulatory Visit: Payer: Self-pay | Admitting: Gastroenterology

## 2015-02-26 ENCOUNTER — Telehealth: Payer: Self-pay | Admitting: Cardiovascular Disease

## 2015-02-27 NOTE — Telephone Encounter (Signed)
Close encounter 

## 2015-02-28 ENCOUNTER — Ambulatory Visit: Payer: Medicare Other | Admitting: Cardiovascular Disease

## 2015-03-06 ENCOUNTER — Telehealth: Payer: Self-pay | Admitting: Cardiovascular Disease

## 2015-03-06 ENCOUNTER — Ambulatory Visit (INDEPENDENT_AMBULATORY_CARE_PROVIDER_SITE_OTHER): Payer: Medicare Other | Admitting: Cardiovascular Disease

## 2015-03-06 ENCOUNTER — Encounter: Payer: Self-pay | Admitting: Cardiovascular Disease

## 2015-03-06 VITALS — BP 124/72 | HR 63 | Ht 73.0 in | Wt 273.2 lb

## 2015-03-06 DIAGNOSIS — I4510 Unspecified right bundle-branch block: Secondary | ICD-10-CM

## 2015-03-06 DIAGNOSIS — I10 Essential (primary) hypertension: Secondary | ICD-10-CM

## 2015-03-06 DIAGNOSIS — G4733 Obstructive sleep apnea (adult) (pediatric): Secondary | ICD-10-CM | POA: Diagnosis not present

## 2015-03-06 DIAGNOSIS — E669 Obesity, unspecified: Secondary | ICD-10-CM | POA: Diagnosis not present

## 2015-03-06 DIAGNOSIS — I2510 Atherosclerotic heart disease of native coronary artery without angina pectoris: Secondary | ICD-10-CM

## 2015-03-06 DIAGNOSIS — Z9989 Dependence on other enabling machines and devices: Secondary | ICD-10-CM

## 2015-03-06 DIAGNOSIS — Z79899 Other long term (current) drug therapy: Secondary | ICD-10-CM

## 2015-03-06 DIAGNOSIS — I2583 Coronary atherosclerosis due to lipid rich plaque: Secondary | ICD-10-CM

## 2015-03-06 DIAGNOSIS — E782 Mixed hyperlipidemia: Secondary | ICD-10-CM

## 2015-03-06 LAB — LIPID PANEL
Cholesterol: 102 mg/dL — ABNORMAL LOW (ref 125–200)
HDL: 46 mg/dL (ref >=40)
LDL CALC: 42 mg/dL (ref <130)
Total CHOL/HDL Ratio: 2.2 Ratio (ref <=5.0)
Triglycerides: 68 mg/dL (ref <150)
VLDL: 14 mg/dL (ref <30)

## 2015-03-06 LAB — COMPREHENSIVE METABOLIC PANEL
ALT: 27 U/L (ref 9–46)
AST: 32 U/L (ref 10–35)
Albumin: 3.2 g/dL — ABNORMAL LOW (ref 3.6–5.1)
Alkaline Phosphatase: 143 U/L — ABNORMAL HIGH (ref 40–115)
BILIRUBIN TOTAL: 0.5 mg/dL (ref 0.2–1.2)
BUN: 16 mg/dL (ref 7–25)
CHLORIDE: 101 mmol/L (ref 98–110)
CO2: 28 mmol/L (ref 20–31)
CREATININE: 0.91 mg/dL (ref 0.70–1.18)
Calcium: 8.9 mg/dL (ref 8.6–10.3)
Glucose, Bld: 94 mg/dL (ref 65–99)
Potassium: 3.9 mmol/L (ref 3.5–5.3)
SODIUM: 136 mmol/L (ref 135–146)
TOTAL PROTEIN: 6.6 g/dL (ref 6.1–8.1)

## 2015-03-06 LAB — CBC
HCT: 41.7 % (ref 39.0–52.0)
HEMOGLOBIN: 13.4 g/dL (ref 13.0–17.0)
MCH: 29.8 pg (ref 26.0–34.0)
MCHC: 32.1 g/dL (ref 30.0–36.0)
MCV: 92.9 fL (ref 78.0–100.0)
MPV: 11.5 fL (ref 8.6–12.4)
PLATELETS: 245 10*3/uL (ref 150–400)
RBC: 4.49 MIL/uL (ref 4.22–5.81)
RDW: 14.6 % (ref 11.5–15.5)
WBC: 8.4 10*3/uL (ref 4.0–10.5)

## 2015-03-06 LAB — TSH: TSH: 3.77 m[IU]/L (ref 0.40–4.50)

## 2015-03-06 NOTE — Telephone Encounter (Signed)
Returned call to [employer], informed Raiann orders sent for pt's return annual labwork.

## 2015-03-06 NOTE — Telephone Encounter (Signed)
New message      Pt is at [facility] for blood work but they do not have an order.  Please fax an order to [phone].  Pt has an appt with Dr Claiborne Billings this afternoon

## 2015-03-06 NOTE — Patient Instructions (Signed)
Your physician wants you to follow-up in: 1 year or sooner if needed. You will receive a reminder letter in the mail two months in advance. If you don't receive a letter, please call our office to schedule the follow-up appointment.   If you need a refill on your cardiac medications before your next appointment, please call your pharmacy.   

## 2015-03-06 NOTE — Progress Notes (Signed)
Patient ID: Joseph Bryan, male   DOB: Mar 28, 1940, 75 y.o.   MRN: [id number]     Primary M.D.: Dr. Claris Gower  HPI: Joseph Bryan is a 75 y.o. male who presents to the office today for a 14 month followup cardiology evaluation.  [name] has CAD and suffered an anterior wall myocardial infarction in November 1998. He underwent primary PTCA of the totally occluded proximal LAD done by me. Due to the concomitant high-grade distal left main stenosis with ostial encroachment to a circumflex vessel following stabilization from his initial intervention and significant myocardial salvage he underwent elective CABG surgery (LIMA to LAD, vein to diagonal, vein to third marginal vessel). He did develop postoperative DVT for which he did require Coumadin therapy. His last catheterization in 2010 showed patent grafts. He has a history of hyperlipidemia also peripheral neuropathy. Two years ago he was admitted to [facility] hospital with abdominal pain and I saw him for preoperative preoperative evaluation.  A preoperative 2-D echo Doppler study showed normal systolic function with mild/moderate tricuspid regurgitation. Mild pulmonary hypertension with a PA pressure 33 mm. He was found to have colonic ischemia with presumed appendix inflammation.  From a cardiac standpoint, he ultimately tolerated his abdominal surgery where he was found to have colonic ischemia and underwent laparoscopic ileo-colectomy by Dr. [name].   A nuclear perfusion which on 11/16/2012 revealed a perfusion defect in the mid to distal anteroapical, apical, inferoapical walls consistent with distal LAD territory scar. Ejection fraction was 54% there was mild anteroapical hypokinesis. This was not significantly changed from his last nuclear study in 2010.  Additional problems include  a history of sleep apnea and uses CPAP therapy 100% of the time. uses [employer] for his D[employer] company.  He admits to 100% compliance.  He denies any  awareness of breakthrough snoring.  He denies any hypersomnolence.  His sleep is restorative.  Since I last saw him, he has been on an antihypertensive medical regimen consisting of atenolol 100 mg, lisinopril 20 mg, and furosemide 40 mg daily.  This dose of furosemide has led to resolution of prior leg swelling.  He also has been on simvastatin 40 mg for hyperlipidemia.  He denies chest pain, PND orthopnea.  He denies presyncope.  He tells me has been taking 2 tablets of 325 mg aspirin daily.  He is unaware of any bleeding.  He does admit to low back discomfort.  He is been found on CT imaging to have bulging disks.  He has seen Dr. [name] for neurosurgical evaluation has undergone epidural injections.  Past Medical History  Diagnosis Date  . Coronary artery disease   . Myocardial infarction ([place])   . Lung disease   . Hypertension   . Heart disease   . Chronic kidney disease   . Liver disease   . Sleep apnea with use of continuous positive airway pressure (CPAP)     Past Surgical History  Procedure Laterality Date  . Lung surgery      right and left lungs - "had air pockets"  . Coronary artery bypass graft    . Colon resection N/A 08/27/2012    Procedure: Diagnostic laparoscopy; Ileocecectomy;  Surgeon: Madilyn Hook, DO;  Location: [facility];  Service: General;  Laterality: N/A;    No Known Allergies  Current Outpatient Prescriptions  Medication Sig Dispense Refill  . acetaminophen-codeine (TYLENOL #4) 300-60 MG tablet Take 1 tablet by mouth as needed.    . Aspirin (ECOTRIN  PO) Take 650 mg/day by mouth daily.    Marland Kitchen atenolol (TENORMIN) 100 MG tablet Take 100 mg by mouth daily.    Raelyn Ensign Pollen 580 MG CAPS Take 2 capsules by mouth daily.    Marland Kitchen doxycycline (VIBRAMYCIN) 100 MG capsule Take 1 capsule by mouth 2 (two) times daily.    Marland Kitchen FLUZONE HIGH-DOSE 0.5 ML SUSY   0  . furosemide (LASIX) 40 MG tablet Take 1 tablet (40 mg total) by mouth daily. 90 tablet 3  . Glucosamine-Chondroitin  (OSTEO BI-FLEX REGULAR STRENGTH PO) Take 1 capsule by mouth daily.    Marland Kitchen lisinopril (PRINIVIL,ZESTRIL) 20 MG tablet Take 1 tablet by mouth daily.    . simvastatin (ZOCOR) 40 MG tablet Take 40 mg by mouth daily.    . vitamin B-12 (CYANOCOBALAMIN) 1000 MCG tablet Take 1,000 mcg by mouth daily.    . vitamin B-6 (PYRIDOXINE) 25 MG tablet Take 25 mg by mouth daily.     No current facility-administered medications for this visit.    Social History   Social History  . Marital Status: Married    Spouse Name: N/A  . Number of Children: N/A  . Years of Education: N/A   Occupational History  . Not on file.   Social History Main Topics  . Smoking status: Former Smoker    Types: Cigarettes    Quit date: 08/25/1988  . Smokeless tobacco: Never Used  . Alcohol Use: No  . Drug Use: No  . Sexual Activity: Not on file   Other Topics Concern  . Not on file   Social History Narrative    Family History  Problem Relation Age of Onset  . Hypertension Father   . Hypertension Sister   . Heart attack Brother    He is married has 3 children 4 grandchildren 2 great-grandchildren. There is remote tobacco history.  ROS General: Negative; No fevers, chills, or night sweats;  HEENT: Negative; No changes in vision or hearing, sinus congestion, difficulty swallowing Pulmonary: Negative; No cough, wheezing, shortness of breath, hemoptysis Cardiovascular: Negative; No chest pain, presyncope, syncope, palpitations Positive for leg swelling GI: Negative; No nausea, vomiting, diarrhea, or abdominal pain GU: Negative; No dysuria, hematuria, or difficulty voiding Musculoskeletal: Positive for low back pain and herniated disks; he walks with a cane Hematologic/Oncology: Negative; no easy bruising, bleeding Endocrine: Negative; no heat/cold intolerance; no diabetes Neuro: Negative; no changes in balance, headaches Skin: Negative; No rashes or skin lesions Psychiatric: Negative; No behavioral problems,  depression Sleep: Positive for obstructive sleep apnea on CPAP therapy.  No snoring, daytime sleepiness, hypersomnolence, bruxism, restless legs, hypnogognic hallucinations, no cataplexy Other comprehensive 14 point system review is negative.   PE BP 124/72 mmHg  Pulse 63  Ht '6\' 1"'  (1.854 m)  Wt 273 lb 3.2 oz (123.923 kg)  BMI 36.05 kg/m2   Wt Readings from Last 3 Encounters:  03/06/15 273 lb 3.2 oz (123.923 kg)  12/15/13 304 lb 6.4 oz (138.075 kg)  01/10/13 288 lb 11.2 oz (130.953 kg)   General: Alert, oriented, no distress.  Skin: normal turgor, suggestive of mild seborrheic dermatitis on his face. HEENT: Normocephalic, atraumatic. Pupils round and reactive; sclera anicteric;no lid lag.  Nose without nasal septal hypertrophy Mouth/Parynx benign; Mallinpatti scale 3 Neck: Thick neck No JVD, no carotid bruitss; normal carotid upstroke Lungs: clear to ausculatation and percussion; no wheezing or rales Chest wall without tenderness to palpation Heart: RRR, s1 s2 normal 1/6 systolic murmur, no S3 gallop.  No diastolic  murmur.  No rubs thrills or heaves. Abdomen: Central adiposity soft, nontender; no hepatosplenomehaly, BS+; abdominal aorta nontender and not dilated by palpation. Back: No CVA tenderness Pulses 2+ Extremities: No edema today; no clubbing, cyanosis  Homan's sign negative  Neurologic: grossly nonfocal Psychological: Normal affect and mood; normal cognition  ECG (independently read by me): Normal sinus rhythm at 63 bpm.  Right bundle-branch block with repolarization changes.  Left axis deviation.  Normal intervals.  December 2015 ECG (independently read by me): Sinus bradycardia 56 bpm.  Right bundle branch block with repolarization changes.  Normal intervals.  December 2014 ECG: Sinus rhythm at 67 beats per minute with right bundle branch block and nonspecific ST-T changes. Qtc 464 msec  LABS: BMP Latest Ref Rng 12/20/2013 10/04/2012 09/05/2012  Glucose 70 - 99 mg/dL  104(H) 104(H) 101(H)  BUN 6 - 23 mg/dL '12 10 14  ' Creatinine 0.50 - 1.35 mg/dL 0.83 0.89 1.02  Sodium 135 - 145 mEq/L 141 137 134(L)  Potassium 3.5 - 5.3 mEq/L 4.2 4.4 3.6  Chloride 96 - 112 mEq/L 103 100 93(L)  CO2 19 - 32 mEq/L 28 29 35(H)  Calcium 8.4 - 10.5 mg/dL 9.6 9.5 8.9   Hepatic Function Latest Ref Rng 12/20/2013 11/12/2012 10/04/2012  Total Protein 6.0 - 8.3 g/dL 7.4 7.1 7.2  Albumin 3.5 - 5.2 g/dL 3.8 3.3(L) 3.6  AST 0 - 37 U/L 41(H) 43(H) 42(H)  ALT 0 - 53 U/L 40 36 61(H)  Alk Phosphatase 39 - 117 U/L 133(H) 159(H) 220(H)  Total Bilirubin 0.2 - 1.2 mg/dL 0.7 0.6 0.6  Bilirubin, Direct 0.0 - 0.3 mg/dL - 0.2 -   CBC Latest Ref Rng 12/20/2013 10/04/2012 09/01/2012  WBC 4.0 - 10.5 K/uL 8.5 10.4 10.7(H)  Hemoglobin 13.0 - 17.0 g/dL 13.9 13.0 11.9(L)  Hematocrit 39.0 - 52.0 % 42.6 39.9 37.2(L)  Platelets 150 - 400 K/uL 248 320 259   Lab Results  Component Value Date   TSH 2.751 12/20/2013   Lab Results  Component Value Date   MCV 90.8 12/20/2013   MCV 87.3 10/04/2012   MCV 91.6 09/01/2012   Lipid Panel     Component Value Date/Time   CHOL 132 12/20/2013 0948   TRIG 92 12/20/2013 0948   HDL 46 12/20/2013 0948   CHOLHDL 2.9 12/20/2013 0948   VLDL 18 12/20/2013 0948   LDLCALC 68 12/20/2013 0948     RADIOLOGY: No results found.    ASSESSMENT AND PLAN: [name] is a 75 year old white male who is 18 years status post suffering an anterior wall myocardial infarction at which time he was treated with PTCA of a totally occluded LAD. Due to severe  left main stenosis he underwent CABG surgery with a LIMA to his LAD, vein to diagonal, and vein to the third marginal vessel. He has been documented have significant salvage of myocardium from his acute PTCA. An echo Doppler study  done preoperatively showed a normal  ejection fraction  and he did have mild to moderate TR.  His last nuclear perfusion study in 2014 shows distal LAD territory scar concordant with his prior  myocardial infarction. Ejection fraction was 54%. There was no region of ischemia. This essentially is unchanged from his previous study in 2010.  With reference to his CAD, he is without chest pain.  His blood pressure today is well controlled on lisinopril 20 mg, furosemide 40 mg, and atenolol 100 mg daily.  There has been resolution of his prior lower extremity edema.  He is on simvastatin 40 mg for hyperlipidemia with target LDL less than 70.  He has continued back discomfort in his low back and has undergone epidural injections by Dr. [name].  He has not seen him in over 6 months and with his back complaints being more significant I have suggested follow-up evaluation.  He continues to use CPAP with 100% compliance and denies any excessive daytime sleepiness or nocturnal palpitations and he feels his sleep is restorative.  I have suggested he reduce his aspirin.  Ideally to a dose of 81 mg but he will try to wean down his 650 mg to 325 mg and if tolerated reduce it further to 81 mg.  I will see him in one year for cardiology reevaluation or sooner from's arise.  Time spent: 25 min  Troy Sine, MD, [facility]  03/06/2015 5:54 PM

## 2015-03-16 ENCOUNTER — Encounter: Payer: Self-pay | Admitting: *Deleted

## 2015-04-04 ENCOUNTER — Encounter (HOSPITAL_COMMUNITY): Payer: Self-pay | Admitting: *Deleted

## 2015-04-05 NOTE — Anesthesia Preprocedure Evaluation (Addendum)
Anesthesia Evaluation  Patient identified by MRN, date of birth, ID band Patient awake    Reviewed: Allergy & Precautions, H&P , NPO status , Patient's Chart, lab work & pertinent test results, reviewed documented beta blocker date and time   Airway Mallampati: II  TM Distance: >3 FB Neck ROM: Full    Dental  (+) Dental Advisory Given, Edentulous Upper, Edentulous Lower   Pulmonary neg pulmonary ROS, sleep apnea and Continuous Positive Airway Pressure Ventilation , former smoker (quit 1990),    breath sounds clear to auscultation       Cardiovascular hypertension, Pt. on medications and Pt. on home beta blockers + CAD, + Past MI and + CABG (1998)   Rhythm:Regular Rate:Normal  ECHO 2014 EF 50%, Stress 2014 Low risk, EF 50%, saw cardiologist 02/2015 for routine FU.   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     Musculoskeletal negative musculoskeletal ROS (+)   Abdominal (+) + obese,   Peds  Hematology negative hematology ROS (+)   Anesthesia Other Findings   Reproductive/Obstetrics                           Anesthesia Physical Anesthesia Plan  ASA: III  Anesthesia Plan: MAC   Post-op Pain Management:    Induction: Intravenous  Airway Management Planned: Nasal Cannula  Additional Equipment:   Intra-op Plan:   Post-operative Plan:   Informed Consent: I have reviewed the patients History and Physical, chart, labs and discussed the procedure including the risks, benefits and alternatives for the proposed anesthesia with the patient or authorized representative who has indicated his/her understanding and acceptance.     Plan Discussed with:   Anesthesia Plan Comments:         Anesthesia Quick Evaluation

## 2015-04-09 ENCOUNTER — Encounter (HOSPITAL_COMMUNITY): Payer: Self-pay | Admitting: *Deleted

## 2015-04-09 ENCOUNTER — Ambulatory Visit (HOSPITAL_COMMUNITY): Payer: Medicare Other | Admitting: Anesthesiology

## 2015-04-09 ENCOUNTER — Ambulatory Visit (HOSPITAL_COMMUNITY)
Admission: RE | Admit: 2015-04-09 | Discharge: 2015-04-09 | Disposition: A | Discharge status: Home / Self Care | Payer: Medicare Other | Source: Ambulatory Visit | Attending: Gastroenterology | Admitting: Gastroenterology

## 2015-04-09 ENCOUNTER — Encounter (HOSPITAL_COMMUNITY)
Admission: RE | Disposition: A | Discharge status: Home / Self Care | Payer: Self-pay | Source: Ambulatory Visit | Attending: Gastroenterology

## 2015-04-09 DIAGNOSIS — Z9049 Acquired absence of other specified parts of digestive tract: Secondary | ICD-10-CM | POA: Insufficient documentation

## 2015-04-09 DIAGNOSIS — Z6836 Body mass index (BMI) 36.0-36.9, adult: Secondary | ICD-10-CM | POA: Diagnosis not present

## 2015-04-09 DIAGNOSIS — Z1211 Encounter for screening for malignant neoplasm of colon: Secondary | ICD-10-CM | POA: Insufficient documentation

## 2015-04-09 DIAGNOSIS — I2510 Atherosclerotic heart disease of native coronary artery without angina pectoris: Secondary | ICD-10-CM | POA: Diagnosis not present

## 2015-04-09 DIAGNOSIS — E7800 Pure hypercholesterolemia, unspecified: Secondary | ICD-10-CM | POA: Insufficient documentation

## 2015-04-09 DIAGNOSIS — Z8509 Personal history of malignant neoplasm of other digestive organs: Secondary | ICD-10-CM | POA: Diagnosis not present

## 2015-04-09 DIAGNOSIS — Z87891 Personal history of nicotine dependence: Secondary | ICD-10-CM | POA: Insufficient documentation

## 2015-04-09 DIAGNOSIS — D123 Benign neoplasm of transverse colon: Secondary | ICD-10-CM | POA: Insufficient documentation

## 2015-04-09 DIAGNOSIS — G4730 Sleep apnea, unspecified: Secondary | ICD-10-CM | POA: Diagnosis not present

## 2015-04-09 DIAGNOSIS — I10 Essential (primary) hypertension: Secondary | ICD-10-CM | POA: Diagnosis not present

## 2015-04-09 DIAGNOSIS — Z951 Presence of aortocoronary bypass graft: Secondary | ICD-10-CM | POA: Diagnosis not present

## 2015-04-09 DIAGNOSIS — I252 Old myocardial infarction: Secondary | ICD-10-CM | POA: Diagnosis not present

## 2015-04-09 DIAGNOSIS — Z86010 Personal history of colonic polyps: Secondary | ICD-10-CM | POA: Diagnosis not present

## 2015-04-09 HISTORY — PX: COLONOSCOPY WITH PROPOFOL: SHX5780

## 2015-04-09 SURGERY — COLONOSCOPY WITH PROPOFOL
Anesthesia: Monitor Anesthesia Care

## 2015-04-09 MED ORDER — PROPOFOL 10 MG/ML IV BOLUS
INTRAVENOUS | Status: AC
  Filled 2015-04-09: qty 60

## 2015-04-09 MED ORDER — PROPOFOL 10 MG/ML IV BOLUS
INTRAVENOUS | Status: DC | PRN
  Administered 2015-04-09: 20 mg via INTRAVENOUS

## 2015-04-09 MED ORDER — LACTATED RINGERS IV SOLN
INTRAVENOUS | Status: DC
  Administered 2015-04-09: 1000 mL via INTRAVENOUS

## 2015-04-09 MED ORDER — PROPOFOL 500 MG/50ML IV EMUL
INTRAVENOUS | Status: DC | PRN
  Administered 2015-04-09: 100 ug/kg/min via INTRAVENOUS

## 2015-04-09 MED ORDER — SODIUM CHLORIDE 0.9 % IV SOLN: INTRAVENOUS | Status: DC

## 2015-04-09 MED ORDER — LIDOCAINE HCL (CARDIAC) 20 MG/ML IV SOLN
INTRAVENOUS | Status: DC | PRN
  Administered 2015-04-09: 25 mg via INTRATRACHEAL

## 2015-04-09 SURGICAL SUPPLY — 21 items

## 2015-04-09 NOTE — H&P (Signed)
  Procedure: Surveillance colonoscopy. History of adenomatous colon polyps removed colonoscopically in the past. 10/18/2009 normal surveillance colonoscopy was performed. 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 He is scheduled to undergo a surveillance colonoscopy today.  Past medical history: Hypertension. Hypercholesterolemia. Lung biopsy. Coronary artery bypass surgery. 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 Abdomen is soft and nontender to palpation. Lungs are clear to auscultation. Cardiac exam regular rhythm.  Plan: Proceed with surveillance colonoscopy

## 2015-04-09 NOTE — Transfer of Care (Signed)
Immediate Anesthesia Transfer of Care Note  Patient: Joseph Bryan  Procedure(s) Performed: Procedure(s): COLONOSCOPY WITH PROPOFOL (N/A)  Patient Location: PACU and Endoscopy Unit  Anesthesia Type:MAC  Level of Consciousness: awake and patient cooperative  Airway & Oxygen Therapy: Patient Spontanous Breathing and Patient connected to face mask oxygen  Post-op Assessment: Report given to RN and Post -op Vital signs reviewed and stable  Post vital signs: Reviewed and stable  Last Vitals:  Filed Vitals:   04/09/15 1031  BP: 126/64  Pulse: 66  Temp: 36.7 C  Resp: 12    Complications: No apparent anesthesia complications

## 2015-04-09 NOTE — Discharge Instructions (Signed)
Colonoscopy, Care After °Refer to this sheet in the next few weeks. These instructions provide you with information on caring for yourself after your procedure. Your health care provider may also give you more specific instructions. Your treatment has been planned according to current medical practices, but problems sometimes occur. Call your health care provider if you have any problems or questions after your procedure. °WHAT TO EXPECT AFTER THE PROCEDURE  °After your procedure, it is typical to have the following: °· A small amount of blood in your stool. °· Moderate amounts of gas and mild abdominal cramping or bloating. °HOME CARE INSTRUCTIONS °· Do not drive, operate machinery, or sign important documents for 24 hours. °· You may shower and resume your regular physical activities, but move at a slower pace for the first 24 hours. °· Take frequent rest periods for the first 24 hours. °· Walk around or put a warm pack on your abdomen to help reduce abdominal cramping and bloating. °· Drink enough fluids to keep your urine clear or pale yellow. °· You may resume your normal diet as instructed by your health care provider. Avoid heavy or fried foods that are hard to digest. °· Avoid drinking alcohol for 24 hours or as instructed by your health care provider. °· Only take over-the-counter or prescription medicines as directed by your health care provider. °· If a tissue sample (biopsy) was taken during your procedure: °¨ Do not take aspirin or blood thinners for 7 days, or as instructed by your health care provider. °¨ Do not drink alcohol for 7 days, or as instructed by your health care provider. °¨ Eat soft foods for the first 24 hours. °SEEK MEDICAL CARE IF: °You have persistent spotting of blood in your stool 2-3 days after the procedure. °SEEK IMMEDIATE MEDICAL CARE IF: °· You have more than a small spotting of blood in your stool. °· You pass large blood clots in your stool. °· Your abdomen is swollen  (distended). °· You have nausea or vomiting. °· You have a fever. °· You have increasing abdominal pain that is not relieved with medicine. °  °This information is not intended to replace advice given to you by your health care provider. Make sure you discuss any questions you have with your health care provider. °  °Document Released: 08/14/2003 Document Revised: 10/20/2012 Document Reviewed: 09/06/2012 °Elsevier Interactive Patient Education ©2016 [employer] ° °

## 2015-04-09 NOTE — Op Note (Signed)
[facility] Patient Name: Joseph Bryan Procedure Date: 04/09/2015 MRN: [id number] Attending MD: Garlan Fair , MD Date of Birth: [birth year] CSN:  Age: 75 Admit Type: Outpatient Procedure:                Colonoscopy Indications:              High risk colon cancer surveillance: Personal                            history of adenoma (10 mm or greater in size).                            Normal surveillance colonoscopy performed on                            10/18/2009. Ileocecectomy to treatcolonic ischemia                            and appendicitis due to mucinous adenocarcinoma of                            the appendix performed on 08/27/2012 Providers:                Garlan Fair, MD, Laverta Baltimore, RN, Elspeth Cho, Technician Referring MD:              Medicines:                Propofol per Anesthesia Complications:            No immediate complications. Estimated Blood Loss:     Estimated blood loss: none. Procedure:                Pre-Anesthesia Assessment:                           - Prior to the procedure, a History and Physical                            was performed, and patient medications and                            allergies were reviewed. The patient's tolerance of                            previous anesthesia was also reviewed. The risks                            and benefits of the procedure and the sedation                            options and risks were discussed with the patient.                            All questions  were answered, and informed consent                            was obtained. Prior Anticoagulants: The patient has                            taken aspirin, last dose was day of procedure. ASA                            Grade Assessment: III - A patient with severe                            systemic disease. After reviewing the risks and                            benefits, the  patient was deemed in satisfactory                            condition to undergo the procedure.                           After obtaining informed consent, the colonoscope                            was passed under direct vision. Throughout the                            procedure, the patient's blood pressure, pulse, and                            oxygen saturations were monitored continuously. The                            [id number] scope was introduced through                            the anus and advanced to the the ileocolonic                            anastomosis. The colonoscopy was performed without                            difficulty. The patient tolerated the procedure                            well. The quality of the bowel preparation was                            good. The rectum was photographed. Scope In: Scope Out: Findings:      The perianal and digital rectal examinations were normal.      A 3 mm polyp was found in the proximal transverse colon. The polyp was       sessile. The polyp was removed with a cold biopsy forceps. Resection was  complete, and retrieval was complete.      The exam was otherwise without abnormality. Impression:               - One 3 mm polyp in the proximal transverse colon,                            removed with a cold biopsy forceps. Resected and                            retrieved.                           - The examination was otherwise normal. Moderate Sedation:      N/A- Per Anesthesia Care Recommendation:           - Repeat colonoscopy is not recommended for                            surveillance.                           - Patient has a contact number available for                            emergencies. The signs and symptoms of potential                            delayed complications were discussed with the                            patient. Return to normal activities tomorrow.                             Written discharge instructions were provided to the                            patient.                           - Resume previous diet.                           - Continue present medications. Procedure Code(s):        --- Professional ---                           [phone], Colonoscopy, flexible; with biopsy, single                            or multiple Diagnosis Code(s):        --- Professional ---                           Z86.010, Personal history of colonic polyps                           D12.3, Benign neoplasm of transverse colon (hepatic  flexure or splenic flexure) CPT copyright 2016 American Medical Association. All rights reserved. The codes documented in this report are preliminary and upon coder review may  be revised to meet current compliance requirements. Earle Gell, MD Garlan Fair, MD 04/09/2015 12:21:00 PM This report has been signed electronically. Number of Addenda: 0

## 2015-04-09 NOTE — Anesthesia Postprocedure Evaluation (Signed)
Anesthesia Post Note  Patient: Joseph Bryan  Procedure(s) Performed: Procedure(s) (LRB): COLONOSCOPY WITH PROPOFOL (N/A)  Patient location during evaluation: PACU Anesthesia Type: MAC Level of consciousness: awake and alert Pain management: pain level controlled Vital Signs Assessment: post-procedure vital signs reviewed and stable Respiratory status: spontaneous breathing, nonlabored ventilation, respiratory function stable and patient connected to nasal cannula oxygen Cardiovascular status: blood pressure returned to baseline and stable Postop Assessment: no signs of nausea or vomiting Anesthetic complications: no    Last Vitals:  Filed Vitals:   04/09/15 1245 04/09/15 1250  BP:    Pulse: 59 97  Temp:    Resp: 13 15    Last Pain: There were no vitals filed for this visit.               Alexis Frock

## 2015-04-10 ENCOUNTER — Encounter (HOSPITAL_COMMUNITY): Payer: Self-pay | Admitting: Gastroenterology

## 2015-09-05 ENCOUNTER — Other Ambulatory Visit: Payer: Self-pay | Admitting: Cardiovascular Disease

## 2015-09-05 MED ORDER — FUROSEMIDE 40 MG PO TABS: 40.0000 mg | ORAL_TABLET | Freq: Every day | ORAL | 1 refills | Status: DC

## 2015-09-05 NOTE — Telephone Encounter (Signed)
Rx(s) sent to pharmacy electronically.  

## 2015-10-08 ENCOUNTER — Other Ambulatory Visit: Payer: Self-pay | Admitting: Neurosurgery

## 2015-10-08 DIAGNOSIS — M48061 Spinal stenosis, lumbar region without neurogenic claudication: Secondary | ICD-10-CM

## 2015-10-15 ENCOUNTER — Ambulatory Visit
Admission: RE | Admit: 2015-10-15 | Discharge: 2015-10-15 | Disposition: A | Discharge status: Home / Self Care | Payer: Medicare Other | Source: Ambulatory Visit | Attending: Neurosurgery | Admitting: Neurosurgery

## 2015-10-15 DIAGNOSIS — M48061 Spinal stenosis, lumbar region without neurogenic claudication: Secondary | ICD-10-CM

## 2015-10-17 ENCOUNTER — Encounter (HOSPITAL_COMMUNITY): Payer: Self-pay

## 2015-10-17 ENCOUNTER — Encounter (HOSPITAL_COMMUNITY)
Admission: RE | Admit: 2015-10-17 | Discharge: 2015-10-17 | Disposition: A | Discharge status: Home / Self Care | Payer: Medicare Other | Source: Ambulatory Visit | Attending: Neurosurgery | Admitting: Neurosurgery

## 2015-10-17 DIAGNOSIS — M5116 Intervertebral disc disorders with radiculopathy, lumbar region: Secondary | ICD-10-CM | POA: Diagnosis present

## 2015-10-17 DIAGNOSIS — Z6836 Body mass index (BMI) 36.0-36.9, adult: Secondary | ICD-10-CM

## 2015-10-17 DIAGNOSIS — M48062 Spinal stenosis, lumbar region with neurogenic claudication: Principal | ICD-10-CM | POA: Diagnosis present

## 2015-10-17 DIAGNOSIS — I252 Old myocardial infarction: Secondary | ICD-10-CM | POA: Diagnosis not present

## 2015-10-17 DIAGNOSIS — N289 Disorder of kidney and ureter, unspecified: Secondary | ICD-10-CM | POA: Diagnosis present

## 2015-10-17 DIAGNOSIS — Z951 Presence of aortocoronary bypass graft: Secondary | ICD-10-CM | POA: Diagnosis not present

## 2015-10-17 DIAGNOSIS — Z79899 Other long term (current) drug therapy: Secondary | ICD-10-CM | POA: Diagnosis not present

## 2015-10-17 DIAGNOSIS — M549 Dorsalgia, unspecified: Secondary | ICD-10-CM | POA: Diagnosis present

## 2015-10-17 DIAGNOSIS — G4730 Sleep apnea, unspecified: Secondary | ICD-10-CM | POA: Diagnosis present

## 2015-10-17 DIAGNOSIS — I10 Essential (primary) hypertension: Secondary | ICD-10-CM | POA: Diagnosis not present

## 2015-10-17 DIAGNOSIS — Z87891 Personal history of nicotine dependence: Secondary | ICD-10-CM

## 2015-10-17 DIAGNOSIS — I2510 Atherosclerotic heart disease of native coronary artery without angina pectoris: Secondary | ICD-10-CM | POA: Diagnosis not present

## 2015-10-17 HISTORY — DX: Polyneuropathy, unspecified: G62.9

## 2015-10-17 HISTORY — DX: Malignant (primary) neoplasm, unspecified: C80.1

## 2015-10-17 LAB — BASIC METABOLIC PANEL
Anion gap: 6 (ref 5–15)
BUN: 8 mg/dL (ref 6–20)
CHLORIDE: 105 mmol/L (ref 101–111)
CO2: 29 mmol/L (ref 22–32)
CREATININE: 0.86 mg/dL (ref 0.61–1.24)
Calcium: 9.3 mg/dL (ref 8.9–10.3)
GFR calc non Af Amer: >60 mL/min (ref >60)
Glucose, Bld: 89 mg/dL (ref 65–99)
POTASSIUM: 4.3 mmol/L (ref 3.5–5.1)
SODIUM: 140 mmol/L (ref 135–145)

## 2015-10-17 LAB — CBC
HCT: 42.3 % (ref 39.0–52.0)
HEMOGLOBIN: 13.4 g/dL (ref 13.0–17.0)
MCH: 30.1 pg (ref 26.0–34.0)
MCHC: 31.7 g/dL (ref 30.0–36.0)
MCV: 95.1 fL (ref 78.0–100.0)
Platelets: 195 10*3/uL (ref 150–400)
RBC: 4.45 MIL/uL (ref 4.22–5.81)
RDW: 14.1 % (ref 11.5–15.5)
WBC: 8.4 10*3/uL (ref 4.0–10.5)

## 2015-10-17 LAB — SURGICAL PCR SCREEN
MRSA, PCR: NEGATIVE
STAPHYLOCOCCUS AUREUS: POSITIVE — AB

## 2015-10-17 NOTE — Progress Notes (Signed)
I called a prescription for Mupirocin ointment to [facility].

## 2015-10-17 NOTE — Pre-Procedure Instructions (Signed)
    Joseph Bryan  10/17/2015    Your procedure is scheduled on Friday, October 6.  Report to [facility] Admitting at 8:00 AM                 .             Call this number if you have problems the morning of surgery:[phone]   Remember:  Do not eat food or drink liquids after midnight Thursday, October 10.  Take these medicines the morning of surgery with A SIP OF WATER:atenolol (TENORMIN).              Stop taking Aspirin and Herbal medication Bee Pollen,Glucosamine-Chondroitin (OSTEO BI-FLEX REGULAR), VITAMINS   .  Do not take any NSAIDs ie: Ibuprofen,  Advil,Naproxen or any medication containing Aspirin.   Do not wear jewelry, make-up or nail polish.  Do not wear lotions, powders, or perfumes, or deodOrant.  Men may shave face and neck.  Do not bring valuables to the hospital.  [facility] is not responsible for any belongings or valuables.  Contacts, dentures or bridgework may not be worn into surgery.  Leave your suitcase in the car.  After surgery it may be brought to your room.  For patients admitted to the hospital, discharge time will be determined by your treatment team.  Patients discharged the day of surgery will not be allowed to drive home.   Special instructions:  Review  [place] - Preparing For Surgery.  Please read over the following fact sheets that you were GIVEN: [place]- Preparing For Surgery and Patient Instructions for Mupirocin Application, PAIN BOOKLET, COUGHING AND DEEP bREATHING

## 2015-10-18 MED ORDER — DEXTROSE 5 % IV SOLN
3.0000 g | INTRAVENOUS | Status: DC
  Filled 2015-10-18: qty 3000

## 2015-10-19 ENCOUNTER — Inpatient Hospital Stay (HOSPITAL_COMMUNITY): Payer: Medicare Other

## 2015-10-19 ENCOUNTER — Encounter (HOSPITAL_COMMUNITY): Payer: Self-pay | Admitting: Surgery

## 2015-10-19 ENCOUNTER — Ambulatory Visit (HOSPITAL_COMMUNITY): Payer: Medicare Other | Admitting: Anesthesiology

## 2015-10-19 ENCOUNTER — Inpatient Hospital Stay (HOSPITAL_COMMUNITY)
Admission: AD | Admit: 2015-10-19 | Discharge: 2015-10-20 | DRG: 517 | Disposition: A | Discharge status: Home / Self Care | Payer: Medicare Other | Source: Ambulatory Visit | Attending: Neurosurgery | Admitting: Neurosurgery

## 2015-10-19 ENCOUNTER — Encounter (HOSPITAL_COMMUNITY)
Admission: AD | Disposition: A | Discharge status: Home / Self Care | Payer: Self-pay | Source: Ambulatory Visit | Attending: Neurosurgery

## 2015-10-19 DIAGNOSIS — Z79899 Other long term (current) drug therapy: Secondary | ICD-10-CM | POA: Diagnosis not present

## 2015-10-19 DIAGNOSIS — M549 Dorsalgia, unspecified: Secondary | ICD-10-CM | POA: Diagnosis present

## 2015-10-19 DIAGNOSIS — G4730 Sleep apnea, unspecified: Secondary | ICD-10-CM | POA: Diagnosis not present

## 2015-10-19 DIAGNOSIS — I252 Old myocardial infarction: Secondary | ICD-10-CM | POA: Diagnosis not present

## 2015-10-19 DIAGNOSIS — Z419 Encounter for procedure for purposes other than remedying health state, unspecified: Secondary | ICD-10-CM

## 2015-10-19 DIAGNOSIS — R52 Pain, unspecified: Secondary | ICD-10-CM

## 2015-10-19 DIAGNOSIS — I2510 Atherosclerotic heart disease of native coronary artery without angina pectoris: Secondary | ICD-10-CM | POA: Diagnosis not present

## 2015-10-19 DIAGNOSIS — Z951 Presence of aortocoronary bypass graft: Secondary | ICD-10-CM | POA: Diagnosis not present

## 2015-10-19 DIAGNOSIS — I10 Essential (primary) hypertension: Secondary | ICD-10-CM | POA: Diagnosis not present

## 2015-10-19 DIAGNOSIS — M48062 Spinal stenosis, lumbar region with neurogenic claudication: Secondary | ICD-10-CM | POA: Diagnosis not present

## 2015-10-19 DIAGNOSIS — M5116 Intervertebral disc disorders with radiculopathy, lumbar region: Secondary | ICD-10-CM | POA: Diagnosis not present

## 2015-10-19 DIAGNOSIS — N289 Disorder of kidney and ureter, unspecified: Secondary | ICD-10-CM | POA: Diagnosis not present

## 2015-10-19 DIAGNOSIS — R2689 Other abnormalities of gait and mobility: Secondary | ICD-10-CM

## 2015-10-19 DIAGNOSIS — Z87891 Personal history of nicotine dependence: Secondary | ICD-10-CM | POA: Diagnosis not present

## 2015-10-19 DIAGNOSIS — Z6836 Body mass index (BMI) 36.0-36.9, adult: Secondary | ICD-10-CM | POA: Diagnosis not present

## 2015-10-19 HISTORY — PX: LUMBAR LAMINECTOMY/DECOMPRESSION MICRODISCECTOMY: SHX5026

## 2015-10-19 LAB — GLUCOSE, CAPILLARY: GLUCOSE-CAPILLARY: 104 mg/dL — AB (ref 65–99)

## 2015-10-19 SURGERY — LUMBAR LAMINECTOMY/DECOMPRESSION MICRODISCECTOMY 1 LEVEL
Anesthesia: General

## 2015-10-19 MED ORDER — ACETAMINOPHEN 650 MG RE SUPP: 650.0000 mg | RECTAL | Status: DC | PRN

## 2015-10-19 MED ORDER — OXYCODONE-ACETAMINOPHEN 5-325 MG PO TABS
1.0000 | ORAL_TABLET | ORAL | Status: DC | PRN
  Administered 2015-10-19: 2 via ORAL
  Administered 2015-10-20: 1 via ORAL
  Filled 2015-10-19: qty 1
  Filled 2015-10-19: qty 2

## 2015-10-19 MED ORDER — LIDOCAINE-EPINEPHRINE 1 %-1:100000 IJ SOLN
INTRAMUSCULAR | Status: AC
  Filled 2015-10-19: qty 1

## 2015-10-19 MED ORDER — ACETAMINOPHEN-CODEINE #3 300-30 MG PO TABS
1.0000 | ORAL_TABLET | ORAL | Status: DC | PRN
  Administered 2015-10-19 – 2015-10-20 (×2): 2 via ORAL
  Filled 2015-10-19 (×2): qty 2

## 2015-10-19 MED ORDER — BISACODYL 10 MG RE SUPP: 10.0000 mg | Freq: Every day | RECTAL | Status: DC | PRN

## 2015-10-19 MED ORDER — SODIUM CHLORIDE 0.9% FLUSH
3.0000 mL | Freq: Two times a day (BID) | INTRAVENOUS | Status: DC
  Administered 2015-10-20: 3 mL via INTRAVENOUS

## 2015-10-19 MED ORDER — PHENYLEPHRINE HCL 10 MG/ML IJ SOLN
INTRAVENOUS | Status: DC | PRN
  Administered 2015-10-19: 25 ug/min via INTRAVENOUS

## 2015-10-19 MED ORDER — FLEET ENEMA 7-19 GM/118ML RE ENEM: 1.0000 | ENEMA | Freq: Once | RECTAL | Status: DC | PRN

## 2015-10-19 MED ORDER — PROMETHAZINE HCL 25 MG/ML IJ SOLN: 6.2500 mg | INTRAMUSCULAR | Status: DC | PRN

## 2015-10-19 MED ORDER — FENTANYL CITRATE (PF) 100 MCG/2ML IJ SOLN
INTRAMUSCULAR | Status: DC | PRN
  Administered 2015-10-19: 50 ug via INTRAVENOUS
  Administered 2015-10-19: 100 ug via INTRAVENOUS
  Administered 2015-10-19: 50 ug via INTRAVENOUS

## 2015-10-19 MED ORDER — KCL IN DEXTROSE-NACL 20-5-0.45 MEQ/L-%-% IV SOLN
INTRAVENOUS | Status: DC
  Administered 2015-10-19: 19:00:00 via INTRAVENOUS
  Filled 2015-10-19 (×2): qty 1000

## 2015-10-19 MED ORDER — 0.9 % SODIUM CHLORIDE (POUR BTL) OPTIME
TOPICAL | Status: DC | PRN
  Administered 2015-10-19: 1000 mL

## 2015-10-19 MED ORDER — CHLORHEXIDINE GLUCONATE CLOTH 2 % EX PADS: 6.0000 | MEDICATED_PAD | Freq: Once | CUTANEOUS | Status: DC

## 2015-10-19 MED ORDER — LACTATED RINGERS IV SOLN
INTRAVENOUS | Status: DC
  Administered 2015-10-19 (×3): via INTRAVENOUS

## 2015-10-19 MED ORDER — ONDANSETRON HCL 4 MG/2ML IJ SOLN
INTRAMUSCULAR | Status: DC | PRN
  Administered 2015-10-19: 4 mg via INTRAVENOUS

## 2015-10-19 MED ORDER — LABETALOL HCL 5 MG/ML IV SOLN
INTRAVENOUS | Status: DC | PRN
  Administered 2015-10-19: 5 mg via INTRAVENOUS

## 2015-10-19 MED ORDER — METHOCARBAMOL 1000 MG/10ML IJ SOLN
500.0000 mg | Freq: Four times a day (QID) | INTRAMUSCULAR | Status: DC | PRN
  Filled 2015-10-19: qty 5

## 2015-10-19 MED ORDER — BEE POLLEN 580 MG PO CAPS: 3.0000 | ORAL_CAPSULE | Freq: Every day | ORAL | Status: DC

## 2015-10-19 MED ORDER — ACETAMINOPHEN-CODEINE #4 300-60 MG PO TABS: 1.0000 | ORAL_TABLET | ORAL | Status: DC | PRN

## 2015-10-19 MED ORDER — MEPERIDINE HCL 25 MG/ML IJ SOLN: 6.2500 mg | INTRAMUSCULAR | Status: DC | PRN

## 2015-10-19 MED ORDER — SENNOSIDES-DOCUSATE SODIUM 8.6-50 MG PO TABS: 1.0000 | ORAL_TABLET | Freq: Every evening | ORAL | Status: DC | PRN

## 2015-10-19 MED ORDER — FENTANYL CITRATE (PF) 100 MCG/2ML IJ SOLN
INTRAMUSCULAR | Status: AC
  Filled 2015-10-19: qty 2

## 2015-10-19 MED ORDER — MENTHOL 3 MG MT LOZG: 1.0000 | LOZENGE | OROMUCOSAL | Status: DC | PRN

## 2015-10-19 MED ORDER — DEXAMETHASONE SODIUM PHOSPHATE 4 MG/ML IJ SOLN
INTRAMUSCULAR | Status: DC | PRN
  Administered 2015-10-19: 4 mg via INTRAVENOUS

## 2015-10-19 MED ORDER — SUGAMMADEX SODIUM 500 MG/5ML IV SOLN
INTRAVENOUS | Status: DC | PRN
  Administered 2015-10-19: 245 mg via INTRAVENOUS

## 2015-10-19 MED ORDER — ACETAMINOPHEN 325 MG PO TABS: 650.0000 mg | ORAL_TABLET | ORAL | Status: DC | PRN

## 2015-10-19 MED ORDER — DOCUSATE SODIUM 100 MG PO CAPS
100.0000 mg | ORAL_CAPSULE | Freq: Two times a day (BID) | ORAL | Status: DC
  Administered 2015-10-19 – 2015-10-20 (×2): 100 mg via ORAL
  Filled 2015-10-19 (×2): qty 1

## 2015-10-19 MED ORDER — HYDROMORPHONE HCL 1 MG/ML IJ SOLN: 0.2500 mg | INTRAMUSCULAR | Status: DC | PRN

## 2015-10-19 MED ORDER — SODIUM CHLORIDE 0.9 % IV SOLN: 250.0000 mL | INTRAVENOUS | Status: DC

## 2015-10-19 MED ORDER — LISINOPRIL 20 MG PO TABS
20.0000 mg | ORAL_TABLET | Freq: Every day | ORAL | Status: DC
  Administered 2015-10-20: 20 mg via ORAL
  Filled 2015-10-19: qty 1

## 2015-10-19 MED ORDER — CEFAZOLIN IN D5W 1 GM/50ML IV SOLN
1.0000 g | Freq: Three times a day (TID) | INTRAVENOUS | Status: AC
  Administered 2015-10-19 – 2015-10-20 (×2): 1 g via INTRAVENOUS
  Filled 2015-10-19 (×2): qty 50

## 2015-10-19 MED ORDER — ONDANSETRON HCL 4 MG/2ML IJ SOLN
INTRAMUSCULAR | Status: AC
  Filled 2015-10-19: qty 2

## 2015-10-19 MED ORDER — SIMVASTATIN 40 MG PO TABS
40.0000 mg | ORAL_TABLET | Freq: Every day | ORAL | Status: DC
  Administered 2015-10-19: 40 mg via ORAL
  Filled 2015-10-19: qty 1

## 2015-10-19 MED ORDER — THROMBIN 5000 UNITS EX SOLR
CUTANEOUS | Status: AC
  Filled 2015-10-19: qty 5000

## 2015-10-19 MED ORDER — LATANOPROST 0.005 % OP SOLN
1.0000 [drp] | Freq: Every day | OPHTHALMIC | Status: DC
  Administered 2015-10-19: 1 [drp] via OPHTHALMIC
  Filled 2015-10-19: qty 2.5

## 2015-10-19 MED ORDER — PHENOL 1.4 % MT LIQD: 1.0000 | OROMUCOSAL | Status: DC | PRN

## 2015-10-19 MED ORDER — VITAMIN B-12 1000 MCG PO TABS
1000.0000 ug | ORAL_TABLET | Freq: Every day | ORAL | Status: DC
  Administered 2015-10-20: 1000 ug via ORAL
  Filled 2015-10-19: qty 1

## 2015-10-19 MED ORDER — LIDOCAINE HCL (CARDIAC) 20 MG/ML IV SOLN
INTRAVENOUS | Status: DC | PRN
  Administered 2015-10-19: 100 mg via INTRAVENOUS

## 2015-10-19 MED ORDER — SODIUM CHLORIDE 0.9% FLUSH: 3.0000 mL | INTRAVENOUS | Status: DC | PRN

## 2015-10-19 MED ORDER — METHOCARBAMOL 500 MG PO TABS
500.0000 mg | ORAL_TABLET | Freq: Four times a day (QID) | ORAL | Status: DC | PRN
  Administered 2015-10-19 – 2015-10-20 (×2): 500 mg via ORAL
  Filled 2015-10-19 (×2): qty 1

## 2015-10-19 MED ORDER — MIDAZOLAM HCL 2 MG/2ML IJ SOLN
INTRAMUSCULAR | Status: AC
  Filled 2015-10-19: qty 2

## 2015-10-19 MED ORDER — METHYLPREDNISOLONE ACETATE 80 MG/ML IJ SUSP
INTRAMUSCULAR | Status: AC
  Filled 2015-10-19: qty 1

## 2015-10-19 MED ORDER — HYDROMORPHONE HCL 1 MG/ML IJ SOLN: 0.5000 mg | INTRAMUSCULAR | Status: DC | PRN

## 2015-10-19 MED ORDER — ESMOLOL HCL 100 MG/10ML IV SOLN
INTRAVENOUS | Status: DC | PRN
  Administered 2015-10-19: 30 mg via INTRAVENOUS
  Administered 2015-10-19: 50 mg via INTRAVENOUS
  Administered 2015-10-19: 20 mg via INTRAVENOUS

## 2015-10-19 MED ORDER — METHYLPREDNISOLONE ACETATE 80 MG/ML IJ SUSP
INTRAMUSCULAR | Status: DC | PRN
  Administered 2015-10-19: 80 mg

## 2015-10-19 MED ORDER — THROMBIN 5000 UNITS EX SOLR
CUTANEOUS | Status: DC | PRN
  Administered 2015-10-19 (×2): 5000 [IU] via TOPICAL

## 2015-10-19 MED ORDER — FENTANYL CITRATE (PF) 100 MCG/2ML IJ SOLN
INTRAMUSCULAR | Status: AC
  Filled 2015-10-19: qty 4

## 2015-10-19 MED ORDER — ONDANSETRON HCL 4 MG/2ML IJ SOLN: 4.0000 mg | INTRAMUSCULAR | Status: DC | PRN

## 2015-10-19 MED ORDER — PYRIDOXINE HCL 25 MG PO TABS
25.0000 mg | ORAL_TABLET | Freq: Every day | ORAL | Status: DC
  Administered 2015-10-20: 25 mg via ORAL
  Filled 2015-10-19: qty 1

## 2015-10-19 MED ORDER — ROCURONIUM BROMIDE 100 MG/10ML IV SOLN
INTRAVENOUS | Status: DC | PRN
  Administered 2015-10-19: 60 mg via INTRAVENOUS

## 2015-10-19 MED ORDER — BUPIVACAINE HCL (PF) 0.5 % IJ SOLN
INTRAMUSCULAR | Status: DC | PRN
  Administered 2015-10-19: 10 mL

## 2015-10-19 MED ORDER — GLUCOSAMINE-CHONDROITIN 250-200 MG PO TABS: ORAL_TABLET | Freq: Every day | ORAL | Status: DC

## 2015-10-19 MED ORDER — ATENOLOL 100 MG PO TABS
100.0000 mg | ORAL_TABLET | Freq: Every day | ORAL | Status: DC
  Administered 2015-10-20: 100 mg via ORAL
  Filled 2015-10-19: qty 1

## 2015-10-19 MED ORDER — LIDOCAINE-EPINEPHRINE 1 %-1:100000 IJ SOLN
INTRAMUSCULAR | Status: DC | PRN
  Administered 2015-10-19: 10 mL

## 2015-10-19 MED ORDER — HYDROCODONE-ACETAMINOPHEN 5-325 MG PO TABS
1.0000 | ORAL_TABLET | ORAL | Status: DC | PRN
  Administered 2015-10-20: 1 via ORAL
  Filled 2015-10-19: qty 1

## 2015-10-19 MED ORDER — FUROSEMIDE 40 MG PO TABS
40.0000 mg | ORAL_TABLET | Freq: Every day | ORAL | Status: DC
Start: 2015-10-19 — End: 2015-10-20
  Administered 2015-10-20: 40 mg via ORAL
  Filled 2015-10-19: qty 1

## 2015-10-19 MED ORDER — CODEINE SULFATE 30 MG PO TABS: 30.0000 mg | ORAL_TABLET | ORAL | Status: DC | PRN

## 2015-10-19 MED ORDER — HEMOSTATIC AGENTS (NO CHARGE) OPTIME
TOPICAL | Status: DC | PRN
  Administered 2015-10-19: 1 via TOPICAL

## 2015-10-19 MED ORDER — DEXAMETHASONE SODIUM PHOSPHATE 10 MG/ML IJ SOLN
INTRAMUSCULAR | Status: AC
  Filled 2015-10-19: qty 1

## 2015-10-19 MED ORDER — ZOLPIDEM TARTRATE 5 MG PO TABS: 5.0000 mg | ORAL_TABLET | Freq: Every evening | ORAL | Status: DC | PRN

## 2015-10-19 MED ORDER — PROPOFOL 10 MG/ML IV BOLUS
INTRAVENOUS | Status: DC | PRN
  Administered 2015-10-19: 160 mg via INTRAVENOUS

## 2015-10-19 MED ORDER — PHENYLEPHRINE HCL 10 MG/ML IJ SOLN
INTRAVENOUS | Status: DC | PRN
  Administered 2015-10-19: 20 ug/min via INTRAVENOUS

## 2015-10-19 MED ORDER — ALUM & MAG HYDROXIDE-SIMETH 200-200-20 MG/5ML PO SUSP: 30.0000 mL | Freq: Four times a day (QID) | ORAL | Status: DC | PRN

## 2015-10-19 MED ORDER — FAMOTIDINE IN NACL 20-0.9 MG/50ML-% IV SOLN
20.0000 mg | Freq: Two times a day (BID) | INTRAVENOUS | Status: DC
  Administered 2015-10-19 – 2015-10-20 (×2): 20 mg via INTRAVENOUS
  Filled 2015-10-19 (×2): qty 50

## 2015-10-19 MED ORDER — PROPOFOL 10 MG/ML IV BOLUS
INTRAVENOUS | Status: AC
  Filled 2015-10-19: qty 20

## 2015-10-19 SURGICAL SUPPLY — 55 items
BIT DRILL NEURO 2X3.1 SFT TUCH (MISCELLANEOUS) ×1 IMPLANT
BLADE CLIPPER SURG (BLADE) IMPLANT
BUR ROUND FLUTED 5 RND (BURR) ×4 IMPLANT
BUR ROUND FLUTED 5MM RND (BURR) ×2
CANISTER SUCT 3000ML PPV (MISCELLANEOUS) ×3 IMPLANT
CLOSURE WOUND 1/2 X4 (GAUZE/BANDAGES/DRESSINGS) ×1
DECANTER SPIKE VIAL GLASS SM (MISCELLANEOUS) ×3 IMPLANT
DERMABOND ADVANCED (GAUZE/BANDAGES/DRESSINGS) ×2
DERMABOND ADVANCED .7 DNX12 (GAUZE/BANDAGES/DRESSINGS) ×1 IMPLANT
DRAPE LAPAROTOMY 100X72X124 (DRAPES) ×3 IMPLANT
DRAPE MICROSCOPE LEICA (MISCELLANEOUS) ×3 IMPLANT
DRAPE POUCH INSTRU U-SHP 10X18 (DRAPES) ×3 IMPLANT
DRAPE SURG 17X23 STRL (DRAPES) ×3 IMPLANT
DRILL NEURO 2X3.1 SOFT TOUCH (MISCELLANEOUS) ×3
DRSG OPSITE POSTOP 4X6 (GAUZE/BANDAGES/DRESSINGS) ×3 IMPLANT
DURAPREP 26ML APPLICATOR (WOUND CARE) ×3 IMPLANT
ELECT REM PT RETURN 9FT ADLT (ELECTROSURGICAL) ×3
ELECTRODE REM PT RTRN 9FT ADLT (ELECTROSURGICAL) ×1 IMPLANT
GAUZE SPONGE 4X4 12PLY STRL (GAUZE/BANDAGES/DRESSINGS) IMPLANT
GAUZE SPONGE 4X4 16PLY XRAY LF (GAUZE/BANDAGES/DRESSINGS) IMPLANT
GLOVE BIO SURGEON STRL SZ8 (GLOVE) ×3 IMPLANT
GLOVE BIOGEL PI IND STRL 8 (GLOVE) ×1 IMPLANT
GLOVE BIOGEL PI IND STRL 8.5 (GLOVE) ×1 IMPLANT
GLOVE BIOGEL PI INDICATOR 8 (GLOVE) ×2
GLOVE BIOGEL PI INDICATOR 8.5 (GLOVE) ×2
GLOVE ECLIPSE 8.0 STRL XLNG CF (GLOVE) ×3 IMPLANT
GLOVE EXAM NITRILE LRG STRL (GLOVE) IMPLANT
GLOVE EXAM NITRILE XL STR (GLOVE) IMPLANT
GLOVE EXAM NITRILE XS STR PU (GLOVE) IMPLANT
GLOVE INDICATOR 7.0 STRL GRN (GLOVE) ×6 IMPLANT
GLOVE SURG SS PI 7.0 STRL IVOR (GLOVE) ×9 IMPLANT
GOWN STRL REUS W/ TWL LRG LVL3 (GOWN DISPOSABLE) IMPLANT
GOWN STRL REUS W/ TWL XL LVL3 (GOWN DISPOSABLE) ×1 IMPLANT
GOWN STRL REUS W/TWL 2XL LVL3 (GOWN DISPOSABLE) ×3 IMPLANT
GOWN STRL REUS W/TWL LRG LVL3 (GOWN DISPOSABLE)
GOWN STRL REUS W/TWL XL LVL3 (GOWN DISPOSABLE) ×2
KIT BASIN OR (CUSTOM PROCEDURE TRAY) ×3 IMPLANT
KIT ROOM TURNOVER OR (KITS) ×3 IMPLANT
NEEDLE HYPO 18GX1.5 BLUNT FILL (NEEDLE) IMPLANT
NEEDLE HYPO 25X1 1.5 SAFETY (NEEDLE) ×3 IMPLANT
NEEDLE SPNL 18GX3.5 QUINCKE PK (NEEDLE) ×3 IMPLANT
NS IRRIG 1000ML POUR BTL (IV SOLUTION) ×3 IMPLANT
PACK LAMINECTOMY NEURO (CUSTOM PROCEDURE TRAY) ×3 IMPLANT
PAD ARMBOARD 7.5X6 YLW CONV (MISCELLANEOUS) ×9 IMPLANT
RUBBERBAND STERILE (MISCELLANEOUS) ×6 IMPLANT
SPONGE SURGIFOAM ABS GEL SZ50 (HEMOSTASIS) ×3 IMPLANT
STRIP CLOSURE SKIN 1/2X4 (GAUZE/BANDAGES/DRESSINGS) ×2 IMPLANT
SUT VIC AB 0 CT1 18XCR BRD8 (SUTURE) ×1 IMPLANT
SUT VIC AB 0 CT1 8-18 (SUTURE) ×2
SUT VIC AB 2-0 CT1 18 (SUTURE) ×3 IMPLANT
SUT VIC AB 3-0 SH 8-18 (SUTURE) ×3 IMPLANT
SYR 5ML LL (SYRINGE) IMPLANT
TOWEL OR 17X24 6PK STRL BLUE (TOWEL DISPOSABLE) ×3 IMPLANT
TOWEL OR 17X26 10 PK STRL BLUE (TOWEL DISPOSABLE) ×3 IMPLANT
WATER STERILE IRR 1000ML POUR (IV SOLUTION) ×3 IMPLANT

## 2015-10-19 NOTE — Interval H&P Note (Signed)
History and Physical Interval Note:  10/19/2015 11:52 AM  Joseph Bryan  has presented today for surgery, with the diagnosis of SPINAL STENOSIS OF LUMBAR REGION  The various methods of treatment have been discussed with the patient and family. After consideration of risks, benefits and other options for treatment, the patient has consented to  Procedure(s) with comments: L3-4 LAMINECTOMY/FORAMINOTOMY (N/A) - L3-4 LAMINECTOMY/FORAMINOTOMY as a surgical intervention .  The patient's history has been reviewed, patient examined, no change in status, stable for surgery.  I have reviewed the patient's chart and labs.  Questions were answered to the patient's satisfaction.     Shiva Sahagian D

## 2015-10-19 NOTE — OR Nursing (Signed)
Fentanyl 100 mg given operative site by DR [name]

## 2015-10-19 NOTE — Anesthesia Procedure Notes (Signed)
Date/Time: 10/19/2015 2:10 PM Performed by: Carney Living Pre-anesthesia Checklist: Patient identified, Emergency Drugs available, Suction available, Patient being monitored and Timeout performed Patient Re-evaluated:Patient Re-evaluated prior to inductionOxygen Delivery Method: Circle system utilized Preoxygenation: Pre-oxygenation with 100% oxygen Intubation Type: IV induction Ventilation: Mask ventilation without difficulty Laryngoscope Size: Mac and 4 Grade View: Grade I Tube type: Oral Tube size: 7.5 mm Number of attempts: 1 Airway Equipment and Method: Stylet Placement Confirmation: ETT inserted through vocal cords under direct vision,  positive ETCO2 and breath sounds checked- equal and bilateral Secured at: 23 cm Tube secured with: Tape Dental Injury: Teeth and Oropharynx as per pre-operative assessment  Comments: Intubation performed by Fredderick Phenix, SRNA

## 2015-10-19 NOTE — Progress Notes (Addendum)
Patient ID: GABLE PERCH, male   DOB: 1941/01/06, 75 y.o.   MRN: [id number] Awake, cooperative. Reports incisional discomfort "but not too bad", he states.  Good strength BLE, able to lift each leg from bed easily against gravity.  X-ray present for AP/Lat..on order list, but not intended as post-op. AP obtained, Lateral view cancelled per DrStern.   Verdis Prime RN BSN  Patient doing well.

## 2015-10-19 NOTE — Op Note (Signed)
10/19/2015  3:36 PM  PATIENT:  Joseph Bryan  75 y.o. 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  BLOOD ADMINISTERED:none  DRAINS: none   LOCAL MEDICATIONS USED:  MARCAINE    and LIDOCAINE   SPECIMEN:  No Specimen  DISPOSITION OF SPECIMEN:  N/A  COUNTS:  YES  TOURNIQUET:  * No tourniquets in log *  DICTATION: DICTATION: Patient has severe spinal stenosis at L34 with significant bilateral leg weakness and intractable pain. It was elected to take him to surgery for L3 through L4 laminectomy.  Procedure: Patient was brought to the operating room and following the smooth and uncomplicated induction of general endotracheal anesthesia she was placed in a prone position on the Wilson frame. Low back was prepped and draped in the usual sterile fashion with DuraPrep. Preop localizing X ray was obtained with spinal needle. Area of planned incision was infiltrated with local lidocaine. Incision was made in the midline and carried to the lumbodorsal fascia which was incised on the right side of midline. Subperiosteal dissection was performed exposing what was felt to be L 34 level. Intraoperative x-ray demonstrated marker probes at L3-4.  A total laminectomy of L 3 and L 4 was performed with leksell, then a high-speed drill and completed with Kerrison rongeurs and generous foraminotomies were performed to decompress the L 2, L 3, L 4, L 5 nerves bilaterally. Ligamentum flavum was detached and  removed in a piecemeal fashion. Angled curettes were used to detatch and then remove thickened compressive ligamentous material.  At this point it was felt that all neural elements were well decompressed. The wound was then irrigated with bacitracin saline. Hemostasis was assured with bipolar electrocautery and the interspace was irrigated with Depo-Medrol and fentanyl. The lumbodorsal fascia was closed with 0 Vicryl sutures the subcutaneous tissues reapproximated 2-0 Vicryl inverted sutures and the skin edges were reapproximated with 3-0 Vicryl subcuticular stitch. The wound is dressed with Dermabond. Patient was extubated in the operating room and taken to recovery in stable and satisfactory condition having tolerated his operation well counts were correct at the end of the case.    PLAN OF CARE: Admit to inpatient   PATIENT DISPOSITION:  PACU - hemodynamically stable.   Delay start of Pharmacological VTE agent (>24hrs) due to surgical blood loss or risk of bleeding: yes

## 2015-10-19 NOTE — Transfer of Care (Signed)
Immediate Anesthesia Transfer of Care Note  Patient: AVORY BARRETTA  Procedure(s) Performed: Procedure(s): Lumbar three to four LAMINECTOMY/FORAMINOTOMY (N/A)  Patient Location: PACU  Anesthesia Type:General  Level of Consciousness: awake, alert  and oriented  Airway & Oxygen Therapy: Patient Spontanous Breathing and Patient connected to nasal cannula oxygen  Post-op Assessment: Report given to RN and Post -op Vital signs reviewed and stable  Post vital signs: Reviewed and stable  Last Vitals:  Vitals:   10/19/15 1547 10/19/15 1548  BP:  125/70  Pulse:  69  Resp:  18  Temp: 36.3 C     Last Pain:  Vitals:   10/19/15 1547  TempSrc:   PainSc: 0-No pain         Complications: No apparent anesthesia complications

## 2015-10-19 NOTE — H&P (Signed)
Patient ID:   [id number] Patient: Joseph Bryan  Date of Birth: 05-28-1940 Visit Type: Office Visit   Date: 10/03/2015 03:00 PM Provider: Marchia Meiers. [name] MD   This 75 year old male presents for back pain and Leg pain.  History of Present Illness: 1.  back pain    The patient returns reporting increased lumbar and bilateral lower extremity pain, numbness, and tingling since his last visit in 2016. He can only walk very short distances before he has to stop. He has lost 55 lbs since last visit.    Physical Exam: LE strength is normal  Previous MRI of his lumbar spine shows significant spinal stenosis most marked at L3-4 with spondylosis and degenerative changes at L3-4, L4-5, and L5-S1 levels.    2.  Leg pain    Medical/Surgical/Interim History Reviewed, no change.  Last detailed document date:05/01/2014.   PAST MEDICAL HISTORY, SURGICAL HISTORY, FAMILY HISTORY, SOCIAL HISTORY AND REVIEW OF SYSTEMS I have reviewed the patient's past medical, surgical, family and social history as well as the comprehensive review of systems as included on the [facility] history form dated 10/03/2015, which I have signed.  Family History: Reviewed, no changes.  Last detailed document: 05/01/2014.   Social History: Tobacco use reviewed. Reviewed, no changes. Last detailed document date: 05/01/2014.      MEDICATIONS(added, continued or stopped this visit): Started Medication Directions Instruction Stopped   acetaminophen 300 mg-codeine 60 mg tablet take 1 tablet by oral route  every 6 hours as needed     atenolol 100 mg tablet take 1 tablet by oral route  every day  10/03/2015   atenolol 50 mg tablet take 1 tablet by oral route  every day     Ecotrin 325 mg tablet,enteric coated take 2 tablet by oral route  every day     furosemide 40 mg tablet take 1 tablet by oral route  every day     lisinopril 20 mg tablet take 1 tablet by oral route  every day    05/01/2014  Norco 5 mg-325 mg tablet take 1 tablet by oral route  every 6 - 8 hours as needed for pain  10/03/2015   Osteo Bi-Flex Take 2 tablets daily     simvastatin 40 mg tablet take 1 tablet by oral route  every day in the evening     tramadol 50 mg tablet take 1 - 2 tablets by oral route  every 4 hours as needed  10/03/2015   Vitamin B-12 1,000 mcg tablet Take 1 tablet daily     Vitamin B-6 100 mg tablet Take 1 tablet daily     Vitamin C 500 mg tablet Take 1 tablet daily       ALLERGIES: Ingredient Reaction Medication Name Comment  NO KNOWN ALLERGIES     No known allergies. Reviewed, no changes.    Vitals Date Temp F BP Pulse Ht In Wt Lb BMI BSA Pain Score  10/03/2015  158/73 79 73 270 35.62  9/10      IMPRESSION The patient continues to experience low back and bilateral leg pain/numbness since his last visit. His MRI from 2016 reveals most sigificant spinal stenosis at L3-4. He also has degenerative changes at L3-4, L4-5 and L5-S1. The patient is frustrated with the pain and would like to get this fixed as soon as possible. Therefore, I recommend an L3-4 laminectomy for alleviation of his low back and bilateral leg symptoms. We will need to get  an updated MRI scan as his last one was from a year ago.    Completed Orders (this encounter) Order Details Reason Side Interpretation Result Initial Treatment Date Region  Hypertension education Follow up with primary care physician.        Lifestyle education regarding diet Encouraged to eat a well balanced diet and follow up with primary care physician.         Assessment/Plan # Detail Type Description   1. Assessment Spinal stenosis of lumbar region (M48.06).       2. 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       3. Assessment Low back pain, unspecified back pain laterality, with sciatica presence unspecified (M54.5).       4. 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       5. Assessment Essential (primary)  hypertension (I10).       6. Assessment Body mass index (BMI) 35.0-35.9, adult (Z68.35).   Plan Orders Today's instructions / counseling include(s) Lifestyle education regarding diet.         Pain Assessment/Treatment Pain Scale: 9/10. Method: Numeric Pain Intensity Scale. Location: back/legs. Onset: 05/01/2007. Duration: varies. Quality: discomforting. Pain Assessment/Treatment follow-up plan of care: Patient is taking medications as prescribed..  Schedule L3-4 laminectomy. Nurse education given. We will order an updated MRI before surgery to make sure nothing has changed. We discussed the importance of continued weight loss.   Orders: Diagnostic Procedures: Assessment Procedure  M48.06 Laminectomy and Foraminotomy - L3 - L4  M48.06 MRI Spine/lumb W/o Contrast  Instruction(s)/Education: Assessment Instruction  I10 Hypertension education  Z68.35 Lifestyle education regarding diet             Provider:  Marchia Meiers. [name] MD  10/03/2015 04:43 PM Dictation edited by: Johnella Moloney    CC Providers: Claris Gower [place],  [place]  [id number]-   [address] [place], [place] [phone]-              Electronically signed by Marchia Meiers [name] MD on 10/05/2015 12:33 PM

## 2015-10-19 NOTE — Anesthesia Preprocedure Evaluation (Addendum)
Anesthesia Evaluation  Patient identified by MRN, date of birth, ID band Patient awake    Reviewed: Allergy & Precautions, H&P , NPO status , Patient's Chart, lab work & pertinent test results, reviewed documented beta blocker date and time   Airway Mallampati: II  TM Distance: >3 FB Neck ROM: Full    Dental  (+) Dental Advisory Given, Edentulous Upper, Edentulous Lower   Pulmonary sleep apnea , former smoker,    breath sounds clear to auscultation       Cardiovascular hypertension, Pt. on medications and Pt. on home beta blockers + CAD, + Past MI and + CABG  + dysrhythmias  Rhythm:Regular Rate:Normal     Neuro/Psych negative neurological ROS  negative psych ROS   GI/Hepatic negative GI ROS, Neg liver ROS,   Endo/Other  Morbid obesity  Renal/GU Renal disease     Musculoskeletal negative musculoskeletal ROS (+)   Abdominal (+) + obese,   Peds  Hematology negative hematology ROS (+)   Anesthesia Other Findings   Reproductive/Obstetrics                             Anesthesia Physical  Anesthesia Plan  ASA: III  Anesthesia Plan: General   Post-op Pain Management:    Induction: Intravenous  Airway Management Planned: Oral ETT  Additional Equipment:   Intra-op Plan:   Post-operative Plan: Extubation in OR  Informed Consent: I have reviewed the patients History and Physical, chart, labs and discussed the procedure including the risks, benefits and alternatives for the proposed anesthesia with the patient or authorized representative who has indicated his/her understanding and acceptance.   Dental advisory given  Plan Discussed with: CRNA, Anesthesiologist and Surgeon  Anesthesia Plan Comments:        Anesthesia Quick Evaluation

## 2015-10-19 NOTE — Brief Op Note (Signed)
10/19/2015  3:36 PM  PATIENT:  Joseph Bryan  75 y.o. 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  BLOOD ADMINISTERED:none  DRAINS: none   LOCAL MEDICATIONS USED:  MARCAINE    and LIDOCAINE   SPECIMEN:  No Specimen  DISPOSITION OF SPECIMEN:  N/A  COUNTS:  YES  TOURNIQUET:  * No tourniquets in log *  DICTATION: DICTATION: Patient has severe spinal stenosis at L34 with significant bilateral leg weakness and intractable pain. It was elected to take him to surgery for L3 through L4 laminectomy.  Procedure: Patient was brought to the operating room and following the smooth and uncomplicated induction of general endotracheal anesthesia she was placed in a prone position on the Wilson frame. Low back was prepped and draped in the usual sterile fashion with DuraPrep. Preop localizing X ray was obtained with spinal needle. Area of planned incision was infiltrated with local lidocaine. Incision was made in the midline and carried to the lumbodorsal fascia which was incised on the right side of midline. Subperiosteal dissection was performed exposing what was felt to be L 34 level. Intraoperative x-ray demonstrated marker probes at L3-4.  A total laminectomy of L 3 and L 4 was performed with leksell, then a high-speed drill and completed with Kerrison rongeurs and generous foraminotomies were performed to decompress the L 2, L 3, L 4, L 5 nerves bilaterally. Ligamentum flavum was detached and  removed in a piecemeal fashion. Angled curettes were used to detatch and then remove thickened compressive ligamentous material.  At this point it was felt that all neural elements were well decompressed. The wound was then irrigated with bacitracin saline. Hemostasis was assured with bipolar electrocautery and the interspace was irrigated with Depo-Medrol and fentanyl. The lumbodorsal fascia was closed with 0 Vicryl sutures the subcutaneous tissues reapproximated 2-0 Vicryl inverted sutures and the skin edges were reapproximated with 3-0 Vicryl subcuticular stitch. The wound is dressed with Dermabond. Patient was extubated in the operating room and taken to recovery in stable and satisfactory condition having tolerated his operation well counts were correct at the end of the case.    PLAN OF CARE: Admit to inpatient   PATIENT DISPOSITION:  PACU - hemodynamically stable.   Delay start of Pharmacological VTE agent (>24hrs) due to surgical blood loss or risk of bleeding: yes

## 2015-10-20 ENCOUNTER — Encounter (HOSPITAL_COMMUNITY): Payer: Self-pay | Admitting: Neurosurgery

## 2015-10-20 DIAGNOSIS — M549 Dorsalgia, unspecified: Secondary | ICD-10-CM | POA: Diagnosis not present

## 2015-10-20 DIAGNOSIS — M48062 Spinal stenosis, lumbar region with neurogenic claudication: Secondary | ICD-10-CM | POA: Diagnosis not present

## 2015-10-20 MED ORDER — METHOCARBAMOL 500 MG PO TABS: 500.0000 mg | ORAL_TABLET | Freq: Four times a day (QID) | ORAL | 3 refills | Status: DC | PRN

## 2015-10-20 MED ORDER — FAMOTIDINE 20 MG PO TABS: 20.0000 mg | ORAL_TABLET | Freq: Two times a day (BID) | ORAL | Status: DC

## 2015-10-20 MED ORDER — ACETAMINOPHEN-CODEINE #3 300-30 MG PO TABS: 1.0000 | ORAL_TABLET | ORAL | 0 refills | Status: DC | PRN

## 2015-10-20 NOTE — Progress Notes (Signed)
OT Cancellation Note  Patient Details Name: CARMAN FLACH MRN: [id number] DOB: 1940-05-23   Cancelled Treatment:    Reason Eval/Treat Not Completed: OT screened, no needs identified, will sign off. Per conversation with PT and pt, pt progressing well after surgery, has necessary equipment and has no concerns regarding ADLs after surgery. Will sign off.  Hortencia Pilar 10/20/2015, 1:05 PM

## 2015-10-20 NOTE — Progress Notes (Signed)
Per pt and his family, he is going home when discharged and is not going to a SNF for rehab. Message left for social worker

## 2015-10-20 NOTE — Progress Notes (Signed)
Pt discharged home in stable condition. Wife and daughter present and provided transportation home. Discharge instructions and discharge meds script given before leaving the unit. Pt and wife voiced understanding the teaching before they left

## 2015-10-20 NOTE — Progress Notes (Signed)
Per message from RN, pt will be returning home. Family no longer wants SNF at this time. No further social work needs. CSW signing off.    Georga Kaufmann, MSW, [place] [employer]  [phone]

## 2015-10-20 NOTE — Discharge Summary (Signed)
Physician Discharge Summary  Patient ID: Joseph Bryan MRN: [id number] DOB/AGE: 75-Jul-1942 75 y.o.  Admit date: 10/19/2015 Discharge date: 10/20/2015  Admission Diagnoses:Lumbar stenosis with neurogenic claudication and radiculopathy L2-3 L3-4 L4-5  Discharge Diagnoses: Lumbar stenosis with neurogenic claudication and radiculopathy L2-3 L3-4 L4-5 Active Problems:   Spinal stenosis, lumbar region, with neurogenic claudication   Discharged Condition: good  Hospital Course: Patient was admitted to undergo surgical decompression which she tolerated well. Motor function has been stable. Incision is clean and dry.  Consults: None  Significant Diagnostic Studies: None  Treatments: Bilateral laminotomies and foraminotomies of 34 L2-3 and L4-5  Discharge Exam: Blood pressure (!) 139/59, pulse 66, temperature 98.2 F (36.8 C), temperature source Oral, resp. rate 20, height 6' (1.829 m), weight 122.5 kg (270 lb), SpO2 99 %. Incision is clean and dry, motor function is intact. Station and gait are intact.  Disposition: 01-Home or Self Care  Discharge Instructions    Call MD for:  redness, tenderness, or signs of infection (pain, swelling, redness, odor or green/yellow discharge around incision site)    Complete by:  As directed    Call MD for:  severe uncontrolled pain    Complete by:  As directed    Call MD for:  temperature >100.4    Complete by:  As directed    Diet - low sodium heart healthy    Complete by:  As directed    Increase activity slowly    Complete by:  As directed        Medication List    STOP taking these medications   acetaminophen-codeine 300-60 MG tablet Commonly known as:  TYLENOL #4 Replaced by:  acetaminophen-codeine 300-30 MG tablet     TAKE these medications   acetaminophen-codeine 300-30 MG tablet Commonly known as:  TYLENOL #3 Take 1-2 tablets by mouth every 4 (four) hours as needed for moderate pain. Replaces:  acetaminophen-codeine 300-60 MG  tablet   atenolol 100 MG tablet Commonly known as:  TENORMIN Take 100 mg by mouth daily.   Bee Pollen 580 MG Caps Take 3 capsules by mouth daily.   furosemide 40 MG tablet Commonly known as:  LASIX Take 1 tablet (40 mg total) by mouth daily.   latanoprost 0.005 % ophthalmic solution Commonly known as:  XALATAN Place 1 drop into both eyes at bedtime.   lisinopril 20 MG tablet Commonly known as:  PRINIVIL,ZESTRIL Take 1 tablet by mouth daily.   methocarbamol 500 MG tablet Commonly known as:  ROBAXIN Take 1 tablet (500 mg total) by mouth every 6 (six) hours as needed for muscle spasms.   OSTEO BI-FLEX REGULAR STRENGTH PO Take 2 capsules by mouth daily.   simvastatin 40 MG tablet Commonly known as:  ZOCOR Take 40 mg by mouth at bedtime. Evening   vitamin B-12 1000 MCG tablet Commonly known as:  CYANOCOBALAMIN Take 1,000 mcg by mouth daily.   vitamin B-6 25 MG tablet Commonly known as:  pyridOXINE Take 25 mg by mouth daily.        SignedEarleen Newport 10/20/2015, 10:50 AM

## 2015-10-20 NOTE — Progress Notes (Signed)
MD paged for the second time for discharge paperwork,currently unable to print AVS

## 2015-10-20 NOTE — Evaluation (Addendum)
Physical Therapy Evaluation Patient Details Name: Joseph Bryan MRN: [id number] DOB: 11/12/1940 Today's Date: 10/20/2015   History of Present Illness  SPINAL STENOSIS OF LUMBAR REGION with neurogenic claudication L 23, L 34, L 45 levels, s/p Lumbar three to four LAMINECTOMY/FORAMINOTOMY (N/A) with decompression of bilateral L 2, L 3, L 4, L 5 nerve roots 10/19/15  Clinical Impression  Pt at baseline acceptable for discharge home with family.  Pt without pain and family reports his walking is better now than before surgery.  Pt has all needed DME. He does not wish to pursue therapy post hospital d/c. Demo to pt instruction to navigate his one step to enter his home (he declined stairs in stairwell). Assisted pt with lower body dressing and feel that he will have assist at home and he has a reacher at home.  Advised pt to purchase long handled sponge.  Instructed in compensatory strategies to manage pain with return verbal understanding.  No need for Occupational therapy as therapy has completed all pt education at this time.    Follow Up Recommendations No PT follow up;Supervision for mobility/OOB    Equipment Recommendations  None recommended by PT    Recommendations for Other Services       Precautions / Restrictions Precautions Precautions: Back Precaution Booklet Issued: Yes (comment) Precaution Comments: review during mobility Restrictions Weight Bearing Restrictions: No      Mobility  Bed Mobility Overal bed mobility: Independent             General bed mobility comments: supine to sit to Rt  Transfers Overall transfer level: Needs assistance Equipment used: Straight cane Transfers: Sit to/from Stand Sit to Stand: Min guard         General transfer comment: from toilet using grab bar  Ambulation/Gait Ambulation/Gait assistance: Min assist Ambulation Distance (Feet): 150 Feet Assistive device: Straight cane Gait Pattern/deviations: WFL(Within Functional  Limits)     General Gait Details: slow cadence, utilized Lt HHA for safety secondary to moderate congested hallway  Stairs            Wheelchair Mobility    Modified Rankin (Stroke Patients Only)       Balance Overall balance assessment: Needs assistance Sitting-balance support: Feet supported;No upper extremity supported Sitting balance-Leahy Scale: Good     Standing balance support: Single extremity supported Standing balance-Leahy Scale: Fair                               Pertinent Vitals/Pain Pain Assessment: No/denies pain    Home Living Family/patient expects to be discharged to:: Private residence Living Arrangements: Spouse/significant other Available Help at Discharge: Family;Available 24 hours/day Type of Home: House Home Access: Stairs to enter Entrance Stairs-Rails: None (grab bar on R) Entrance Stairs-Number of Steps: 1 Home Layout: One level Home Equipment: Walker - 2 wheels;Cane - single point;Shower seat      Prior Function Level of Independence: Independent with assistive device(s)         Comments: utilized cane for short distances     Hand Dominance   Dominant Hand: Right    Extremity/Trunk Assessment   Upper Extremity Assessment: Overall WFL for tasks assessed           Lower Extremity Assessment: Overall WFL for tasks assessed      Cervical / Trunk Assessment: Normal  Communication   Communication: No difficulties;HOH  Cognition Arousal/Alertness: Awake/alert Behavior During Therapy: WFL for tasks  assessed/performed Overall Cognitive Status: Within Functional Limits for tasks assessed                      General Comments General comments (skin integrity, edema, etc.): dressing clean dry and intact.  Assisted pt with lower body dressing min assist    Exercises     Assessment/Plan    PT Assessment Patent does not need any further PT services  PT Problem List            PT Treatment  Interventions      PT Goals (Current goals can be found in the Care Plan section)  Acute Rehab PT Goals Patient Stated Goal: return home today PT Goal Formulation: With patient/family    Frequency     Barriers to discharge        Co-evaluation               End of Session   Activity Tolerance: Patient tolerated treatment well Patient left: in bed;with call bell/phone within reach;with family/visitor present (sitting to EOB) Nurse Communication: Mobility status         Time: [id number] PT Time Calculation (min) (ACUTE ONLY): 19 min   Charges:   PT Evaluation $PT Eval Low Complexity: 1 Procedure     PT G CodesMalka So, PT [phone]  [place] 10/20/2015, 12:59 PM

## 2015-10-22 NOTE — Anesthesia Postprocedure Evaluation (Signed)
Anesthesia Post Note  Patient: Joseph Bryan  Procedure(s) Performed: Procedure(s) (LRB): Lumbar three to four LAMINECTOMY/FORAMINOTOMY (N/A)  Patient location during evaluation: PACU Anesthesia Type: General Level of consciousness: awake and alert Pain management: pain level controlled Vital Signs Assessment: post-procedure vital signs reviewed and stable Respiratory status: spontaneous breathing, nonlabored ventilation, respiratory function stable and patient connected to nasal cannula oxygen Cardiovascular status: blood pressure returned to baseline and stable Postop Assessment: no signs of nausea or vomiting Anesthetic complications: no    Last Vitals:  Vitals:   10/20/15 0953 10/20/15 1332  BP: (!) 139/59 (!) 117/57  Pulse: 66 60  Resp: 20 20  Temp: 36.8 C 36.9 C    Last Pain:  Vitals:   10/20/15 1332  TempSrc: Oral  PainSc:                  Reginal Lutes

## 2015-11-09 NOTE — Progress Notes (Signed)
PT evaluation addendum Late entry for 10/20/15 gcodes   10/20/15 1200  PT Time Calculation  PT Start Time (ACUTE ONLY) 1142  PT Stop Time (ACUTE ONLY) 1201  PT Time Calculation (min) (ACUTE ONLY) 19 min  PT G-Codes **NOT FOR INPATIENT CLASS**  Functional Assessment Tool Used clinical judgement/chart review  Functional Limitation Mobility: Walking and moving around  Mobility: Walking and Moving Around Current Status [phone]) CI  Mobility: Walking and Moving Around Goal Status [id number]) CI  PT General Charges  $$ ACUTE PT VISIT 1 Procedure  PT Evaluation  $PT Eval Low Complexity 1 Procedure   11/09/2015 [place], [place]

## 2016-03-05 ENCOUNTER — Encounter: Payer: Self-pay | Admitting: Cardiovascular Disease

## 2016-03-05 ENCOUNTER — Ambulatory Visit (INDEPENDENT_AMBULATORY_CARE_PROVIDER_SITE_OTHER): Payer: Medicare Other | Admitting: Cardiovascular Disease

## 2016-03-05 VITALS — BP 154/94 | HR 102 | Ht 72.0 in | Wt 285.2 lb

## 2016-03-05 DIAGNOSIS — I2583 Coronary atherosclerosis due to lipid rich plaque: Secondary | ICD-10-CM | POA: Diagnosis not present

## 2016-03-05 DIAGNOSIS — I252 Old myocardial infarction: Secondary | ICD-10-CM | POA: Diagnosis not present

## 2016-03-05 DIAGNOSIS — Z9989 Dependence on other enabling machines and devices: Secondary | ICD-10-CM

## 2016-03-05 DIAGNOSIS — Z79899 Other long term (current) drug therapy: Secondary | ICD-10-CM | POA: Diagnosis not present

## 2016-03-05 DIAGNOSIS — I2510 Atherosclerotic heart disease of native coronary artery without angina pectoris: Secondary | ICD-10-CM

## 2016-03-05 DIAGNOSIS — I10 Essential (primary) hypertension: Secondary | ICD-10-CM | POA: Diagnosis not present

## 2016-03-05 DIAGNOSIS — G4733 Obstructive sleep apnea (adult) (pediatric): Secondary | ICD-10-CM | POA: Diagnosis not present

## 2016-03-05 DIAGNOSIS — E782 Mixed hyperlipidemia: Secondary | ICD-10-CM | POA: Diagnosis not present

## 2016-03-05 LAB — COMPREHENSIVE METABOLIC PANEL
ALT: 34 U/L (ref 9–46)
AST: 46 U/L — ABNORMAL HIGH (ref 10–35)
Albumin: 3.8 g/dL (ref 3.6–5.1)
Alkaline Phosphatase: 179 U/L — ABNORMAL HIGH (ref 40–115)
BUN: 10 mg/dL (ref 7–25)
CHLORIDE: 100 mmol/L (ref 98–110)
CO2: 31 mmol/L (ref 20–31)
CREATININE: 1.01 mg/dL (ref 0.70–1.18)
Calcium: 9.5 mg/dL (ref 8.6–10.3)
Glucose, Bld: 101 mg/dL — ABNORMAL HIGH (ref 65–99)
Potassium: 4.7 mmol/L (ref 3.5–5.3)
SODIUM: 140 mmol/L (ref 135–146)
TOTAL PROTEIN: 7.4 g/dL (ref 6.1–8.1)
Total Bilirubin: 0.5 mg/dL (ref 0.2–1.2)

## 2016-03-05 LAB — LIPID PANEL
CHOL/HDL RATIO: 3 ratio (ref <5.0)
Cholesterol: 189 mg/dL (ref <200)
HDL: 63 mg/dL (ref >40)
LDL CALC: 107 mg/dL — AB (ref <100)
Triglycerides: 93 mg/dL (ref <150)
VLDL: 19 mg/dL (ref <30)

## 2016-03-05 LAB — CBC
HCT: 43.4 % (ref 38.5–50.0)
Hemoglobin: 14.1 g/dL (ref 13.2–17.1)
MCH: 29 pg (ref 27.0–33.0)
MCHC: 32.5 g/dL (ref 32.0–36.0)
MCV: 89.3 fL (ref 80.0–100.0)
MPV: 10.6 fL (ref 7.5–12.5)
PLATELETS: 222 10*3/uL (ref 140–400)
RBC: 4.86 MIL/uL (ref 4.20–5.80)
RDW: 14.4 % (ref 11.0–15.0)
WBC: 7.3 10*3/uL (ref 3.8–10.8)

## 2016-03-05 LAB — TSH: TSH: 4.46 mIU/L (ref 0.40–4.50)

## 2016-03-05 MED ORDER — ATENOLOL 50 MG PO TABS: 75.0000 mg | ORAL_TABLET | Freq: Every day | ORAL | 6 refills | Status: DC

## 2016-03-05 NOTE — Progress Notes (Signed)
Patient ID: Joseph Bryan, male   DOB: 09/25/1940, 76 y.o.   MRN: [id number]     Primary M.D.: Dr. Claris Gower  HPI: Joseph Bryan is a 76 y.o. male who presents to the office today for a 12 month followup cardiology evaluation.  Joseph Bryan has CAD and suffered an anterior wall myocardial infarction in November 1998. He underwent primary PTCA of the totally occluded proximal LAD done by me. Due to the concomitant high-grade distal left main stenosis with ostial encroachment to a circumflex vessel following stabilization from his initial intervention and significant myocardial salvage he underwent elective CABG surgery (LIMA to LAD, vein to diagonal, vein to third marginal vessel). He did develop postoperative DVT for which he did require Coumadin therapy. His last catheterization in 2010 showed patent grafts. He has a history of hyperlipidemia also peripheral neuropathy. Two years ago he was admitted to [facility] with abdominal pain and I saw him for preoperative preoperative evaluation.  A preoperative 2-D echo Doppler study showed normal systolic function with mild/moderate tricuspid regurgitation. Mild pulmonary hypertension with a PA pressure 33 mm. He was found to have colonic ischemia with presumed appendix inflammation.  From a cardiac standpoint, he ultimately tolerated his abdominal surgery where he was found to have colonic ischemia and underwent laparoscopic ileo-colectomy by Dr. [name].   A nuclear perfusion which on 11/16/2012 revealed a perfusion defect in the mid to distal anteroapical, apical, inferoapical walls consistent with distal LAD territory scar. Ejection fraction was 54% there was mild anteroapical hypokinesis. This was not significantly changed from his last nuclear study in 2010.  Additional problems include  a history of sleep apnea and uses CPAP therapy 100% of the time. uses [facility] for his DME company.  He admits to 100% compliance.  He denies any  awareness of breakthrough snoring.  He denies any hypersomnolence.  His sleep is restorative.  Since I last saw him, he has been on an antihypertensive medical regimen consisting of atenolol 100 mg, lisinopril 20 mg, and furosemide 40 mg daily.  This dose of furosemide has led to resolution of prior leg swelling.  He also has been on simvastatin 40 mg for hyperlipidemia.  He denies chest pain, PND orthopnea.  He denies presyncope.  And I last saw him, he had been taking low-dose aspirin 2 tablets daily.  I recommended reduction to a 1 mg.  He is unaware of any bleeding.  He does admit to low back discomfort.  He is been found on CT imaging to have bulging disks.  He has seen Dr. [name] for neurosurgical evaluation has undergone epidural injections.  Since I last saw him, he tells me that his atenolol dose had been reduced by his primary physician from 100 mg down to 50 mg.  He now is on atenolol 50 mg and lisinopril 20 mg as well as furosemide 40 mg for hypertension.  He denies any significant edema.  He continues to be on simvastatin for hyperlipidemia.  He has obstructive sleep apnea and admits to 100% compliance with CPAP therapy.  Need for new supplies and specifically a new facemask as well as tubing.  He is uncertain who was DME company.  He is in a prescription will need to be given to his DME company.  He presents for evaluation.   Past Medical History:  Diagnosis Date  . 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 Coronary artery disease   . Heart disease   . Hypertension   . Liver disease    pt denies  . Lung disease    pt denies  . Myocardial infarction 17 yrs ago  . Neuropathy (HCC)    feet  . Sleep apnea with use of continuous positive airway pressure (CPAP)     Past Surgical History:  Procedure Laterality Date  . APPENDECTOMY  2014  . COLON RESECTION N/A 08/27/2012   Procedure: Diagnostic laparoscopy;  Ileocecectomy;  Surgeon: Madilyn Hook, DO;  Location: [facility] ORS;  Service: General;  Laterality: N/A;  . COLONOSCOPY WITH PROPOFOL N/A 04/09/2015   Procedure: COLONOSCOPY WITH PROPOFOL;  Surgeon: Garlan Fair, MD;  Location: [facility] ENDOSCOPY;  Service: Endoscopy;  Laterality: N/A;  . CORONARY ARTERY BYPASS GRAFT  17 yrs ago   x 5  . LUMBAR LAMINECTOMY/DECOMPRESSION MICRODISCECTOMY N/A 10/19/2015   Procedure: Lumbar three to four LAMINECTOMY/FORAMINOTOMY;  Surgeon: Erline Levine, MD;  Location: [facility];  Service: Neurosurgery;  Laterality: N/A;  . LUNG SURGERY  yrs ago   right and left lungs - "had air pockets"    No Known Allergies  Current Outpatient Prescriptions  Medication Sig Dispense Refill  . atenolol (TENORMIN) 50 MG tablet Take 1.5 tablets (75 mg total) by mouth daily. 45 tablet 6  . Bee Pollen 580 MG CAPS Take 3 capsules by mouth daily.     . furosemide (LASIX) 40 MG tablet Take 1 tablet (40 mg total) by mouth daily. 90 tablet 1  . Glucosamine-Chondroitin (OSTEO BI-FLEX REGULAR STRENGTH PO) Take 2 capsules by mouth daily.     Marland Kitchen latanoprost (XALATAN) 0.005 % ophthalmic solution Place 1 drop into both eyes at bedtime.    Marland Kitchen lisinopril (PRINIVIL,ZESTRIL) 20 MG tablet Take 1 tablet by mouth daily.    . methocarbamol (ROBAXIN) 500 MG tablet Take 1 tablet (500 mg total) by mouth every 6 (six) hours as needed for muscle spasms. 40 tablet 3  . simvastatin (ZOCOR) 40 MG tablet Take 40 mg by mouth at bedtime. Evening     No current facility-administered medications for this visit.     Social History   Social History  . Marital status: Married    Spouse name: N/A  . Number of children: N/A  . Years of education: N/A   Occupational History  . Not on file.   Social History Main Topics  . Smoking status: Former Smoker    Types: Cigarettes    Quit date: 08/25/1988  . Smokeless tobacco: Never Used  . Alcohol use No  . Drug use: No  . Sexual activity: Not on file   Other Topics Concern    . Not on file   Social History Narrative  . No narrative on file    Family History  Problem Relation Age of Onset  . Hypertension Father   . Hypertension Sister   . Heart attack Brother    He is married has 3 children 4 grandchildren 2 great-grandchildren. There is remote tobacco history.  ROS General: Negative; No fevers, chills, or night sweats;  HEENT: Negative; No changes in vision or hearing, sinus congestion, difficulty swallowing Pulmonary: Negative; No cough, wheezing, shortness of breath, hemoptysis Cardiovascular: Negative; No chest pain, presyncope, syncope, palpitations Positive for leg swelling GI: Negative; No nausea, vomiting, diarrhea, or abdominal pain GU: Negative; No dysuria, hematuria, or difficulty voiding Musculoskeletal: Positive for low back pain and herniated disks; he walks with a  cane Hematologic/Oncology: Negative; no easy bruising, bleeding Endocrine: Negative; no heat/cold intolerance; no diabetes Neuro: Negative; no changes in balance, headaches Skin: Negative; No rashes or skin lesions Psychiatric: Negative; No behavioral problems, depression Sleep: Positive for obstructive sleep apnea on CPAP therapy.  No snoring, daytime sleepiness, hypersomnolence, bruxism, restless legs, hypnogognic hallucinations, no cataplexy Other comprehensive 14 point system review is negative.   PE BP (!) 154/94   Pulse (!) 102   Ht 6' (1.829 m)   Wt 285 lb 3.2 oz (129.4 kg)   SpO2 95%   BMI 38.68 kg/m    Repeat blood pressure by me was 144/88  Wt Readings from Last 3 Encounters:  03/05/16 285 lb 3.2 oz (129.4 kg)  10/19/15 270 lb (122.5 kg)  10/17/15 270 lb 11.2 oz (122.8 kg)   General: Alert, oriented, no distress.  Skin: normal turgor, suggestive of mild seborrheic dermatitis on his face. HEENT: Normocephalic, atraumatic. Pupils round and reactive; sclera anicteric;no lid lag.  Nose without nasal septal hypertrophy Mouth/Parynx benign; Mallinpatti  scale 3 Neck: Thick neck No JVD, no carotid bruitss; normal carotid upstroke Lungs: clear to ausculatation and percussion; no wheezing or rales Chest wall without tenderness to palpation Heart: RRR, s1 s2 normal 1/6 systolic murmur, no S3 gallop.  No diastolic murmur.  No rubs thrills or heaves. Abdomen: Central adiposity soft, nontender; no hepatosplenomehaly, BS+; abdominal aorta nontender and not dilated by palpation. Back: No CVA tenderness Pulses 2+ Extremities: No edema today; no clubbing, cyanosis  Homan's sign negative  Neurologic: grossly nonfocal Psychological: Normal affect and mood; normal cognition  ECG (independently read by me): Sinus tachycardia at 102 bpm, right bundle branch block, left anterior hemiblock.  LVH.  February 2017 ECG (independently read by me): Normal sinus rhythm at 63 bpm.  Right bundle-branch block with repolarization changes.  Left axis deviation.  Normal intervals.  December 2015 ECG (independently read by me): Sinus bradycardia 56 bpm.  Right bundle branch block with repolarization changes.  Normal intervals.  December 2014 ECG: Sinus rhythm at 67 beats per minute with right bundle branch block and nonspecific ST-T changes. Qtc 464 msec  LABS: BMP Latest Ref Rng & Units 03/05/2016 10/17/2015 03/06/2015  Glucose 65 - 99 mg/dL 101(H) 89 94  BUN 7 - 25 mg/dL '10 8 16  ' Creatinine 0.70 - 1.18 mg/dL 1.01 0.86 0.91  Sodium 135 - 146 mmol/L 140 140 136  Potassium 3.5 - 5.3 mmol/L 4.7 4.3 3.9  Chloride 98 - 110 mmol/L 100 105 101  CO2 20 - 31 mmol/L '31 29 28  ' Calcium 8.6 - 10.3 mg/dL 9.5 9.3 8.9   Hepatic Function Latest Ref Rng & Units 03/05/2016 03/06/2015 12/20/2013  Total Protein 6.1 - 8.1 g/dL 7.4 6.6 7.4  Albumin 3.6 - 5.1 g/dL 3.8 3.2(L) 3.8  AST 10 - 35 U/L 46(H) 32 41(H)  ALT 9 - 46 U/L 34 27 40  Alk Phosphatase 40 - 115 U/L 179(H) 143(H) 133(H)  Total Bilirubin 0.2 - 1.2 mg/dL 0.5 0.5 0.7  Bilirubin, Direct 0.0 - 0.3 mg/dL - - -   CBC Latest Ref  Rng & Units 03/05/2016 10/17/2015 03/06/2015  WBC 3.8 - 10.8 K/uL 7.3 8.4 8.4  Hemoglobin 13.2 - 17.1 g/dL 14.1 13.4 13.4  Hematocrit 38.5 - 50.0 % 43.4 42.3 41.7  Platelets 140 - 400 K/uL 222 195 245   Lab Results  Component Value Date   TSH 4.46 03/05/2016   Lab Results  Component Value Date   MCV  89.3 03/05/2016   MCV 95.1 10/17/2015   MCV 92.9 03/06/2015   Lipid Panel     Component Value Date/Time   CHOL 189 03/05/2016 1056   TRIG 93 03/05/2016 1056   HDL 63 03/05/2016 1056   CHOLHDL 3.0 03/05/2016 1056   VLDL 19 03/05/2016 1056   LDLCALC 107 (H) 03/05/2016 1056     RADIOLOGY: No results found.  IMPRESSION:  1. Coronary artery disease due to lipid rich plaque   2. Essential hypertension   3. OSA on CPAP   4. History of MI (myocardial infarction)   5. Drug therapy   6. Mixed hyperlipidemia    ASSESSMENT AND PLAN: Joseph Bryan is a 76 year old white male who is 19 years status post suffering an anterior wall myocardial infarction at which time he was treated with PTCA of a totally occluded LAD. Due to severe  left main stenosis he underwent CABG surgery with a LIMA to his LAD, vein to diagonal, and vein to the third marginal vessel. He has been documented have significant salvage of myocardium from his acute PTCA. An echo Doppler study  done preoperatively showed a normal  ejection fraction  and he did have mild to moderate TR.  His last nuclear perfusion study in 2014 shows distal LAD territory scar concordant with his prior myocardial infarction. Ejection fraction was 54%. There was no region of ischemia. This was unchanged from his previous study in 2010.  With reference to his CAD, he is without chest pain.  Since I last saw him, his dose of atenolol had been reduced.  His ECG today shows a resting tachycardia at 102 and his blood pressure is elevated.  I have remote.  Recommended slight increase in his atenolol dose up to 75 mg since reportedly in the past.  His pulse had  gotten too slow on 50 mg.  I [facility] so that he may obtain new CPAP supplies.  He admits to 100% compliance with CPAP therapy.  I recommended.  Air-fluid F 24, face mask as well as new tubing.  A download will also be obtained to assess adequacy of therapy.  A complete set of fasting laboratory will be obtained in medications will be adjusted if necessary.  If his LDL was not at target, he will be changed from simvastatin to hypo-to statin therapy.  6 months, I am recommending he undergo a four-year follow-up nuclear perfusion study to reassess his CABG revascularization surgery which will be almost 20 years..  I will see him in the office for follow-up evaluation. Time spent: 25 min  Troy Sine, MD, [facility]  03/06/2016 7:42 AM

## 2016-03-05 NOTE — Patient Instructions (Signed)
Medication Instructions:   1.) the atenolol has been increased to 75 mg daily ( 1 & 1/2 tablet)  Labwork:  labslips provided to you today. You will need to  Fast  For this blood work.  Testing/Procedures:  Your physician has requested that you have a lexiscan myoview in 6 MONTHS. For further information please visit [email or web address]. Please follow instruction sheet, as given.   Follow-Up:  Your physician wants you to follow-up in: 6 months or sooner if needed. You will receive a reminder letter in the mail two months in advance. If you don't receive a letter, please call our office to schedule the follow-up appointment.   Any Other Special Instructions Will Be Listed Below (If Applicable).

## 2016-03-11 ENCOUNTER — Telehealth: Payer: Self-pay | Admitting: Cardiovascular Disease

## 2016-03-11 NOTE — Telephone Encounter (Signed)
Left message that I have requested the sleep studies from [facility] in [place]. If they cannot retreive them I will have to order his paper chart. I will send them to him once I get them.

## 2016-03-11 NOTE — Telephone Encounter (Signed)
He needs the patient's sleep study asap please.

## 2016-03-24 ENCOUNTER — Telehealth: Payer: Self-pay | Admitting: *Deleted

## 2016-03-24 DIAGNOSIS — Z79899 Other long term (current) drug therapy: Secondary | ICD-10-CM

## 2016-03-24 NOTE — Telephone Encounter (Signed)
Left message for pt to call.

## 2016-03-24 NOTE — Telephone Encounter (Signed)
-----   Message from Troy Sine, MD sent at 03/15/2016  5:54 PM EST ----- DL is increased.  AST is minimally increased in ALT is normal.  DC simvastatin 40 mg and change to rosuvastatin 20 mg.  Follow-up hepatic panel in 2 weeks and hepatic and lipid studies in 6weeks.

## 2016-04-03 MED ORDER — ROSUVASTATIN CALCIUM 20 MG PO TABS: 20.0000 mg | ORAL_TABLET | Freq: Every day | ORAL | 3 refills | Status: DC

## 2016-04-03 NOTE — Telephone Encounter (Signed)
Returned call to patient- made aware of results and recommendations.  Rx sent to pharmacy and lab orders placed.    Patient aware and verbalized understanding.

## 2016-04-03 NOTE — Telephone Encounter (Signed)
Follow up ° °Pt voiced returning nurses call. ° °Please f/u °

## 2016-04-17 ENCOUNTER — Other Ambulatory Visit: Payer: Self-pay | Admitting: *Deleted

## 2016-04-17 DIAGNOSIS — Z79899 Other long term (current) drug therapy: Secondary | ICD-10-CM

## 2016-04-17 LAB — HEPATIC FUNCTION PANEL
ALK PHOS: 160 U/L — AB (ref 40–115)
ALT: 36 U/L (ref 9–46)
AST: 45 U/L — ABNORMAL HIGH (ref 10–35)
Albumin: 3.6 g/dL (ref 3.6–5.1)
BILIRUBIN DIRECT: 0.2 mg/dL (ref <=0.2)
BILIRUBIN INDIRECT: 0.4 mg/dL (ref 0.2–1.2)
BILIRUBIN TOTAL: 0.6 mg/dL (ref 0.2–1.2)
Total Protein: 7 g/dL (ref 6.1–8.1)

## 2016-04-17 LAB — LIPID PANEL
Cholesterol: 129 mg/dL (ref <200)
HDL: 61 mg/dL (ref >40)
LDL Cholesterol: 50 mg/dL (ref <100)
Total CHOL/HDL Ratio: 2.1 Ratio (ref <5.0)
Triglycerides: 88 mg/dL (ref <150)
VLDL: 18 mg/dL (ref <30)

## 2016-04-28 ENCOUNTER — Telehealth: Payer: Self-pay | Admitting: *Deleted

## 2016-04-28 NOTE — Telephone Encounter (Signed)
-----   Message from Darlina Guys sent at 04/10/2016  3:52 PM EDT ----- Joseph Bryan. Fyi, we are unable to contact this patient to set him up as a new pap supplies pt. We have left 2 messages and communicated with a family member once. When we spoke to a family member she answered and stated that the patient did not want to be bothered, then disconnected the call.   Thanks  [employer]

## 2016-04-28 NOTE — Telephone Encounter (Signed)
Called patient to follow up on the message received from [facility]. Patient informs me that he did receive his supplies last Friday. He will take SD card back to [facility] in another week to have a download done. Informed him that once the download has been read I will let him know if any changes will be made to his pressures. Patient voiced understanding.

## 2016-04-30 ENCOUNTER — Other Ambulatory Visit: Payer: Self-pay | Admitting: *Deleted

## 2016-04-30 ENCOUNTER — Telehealth: Payer: Self-pay | Admitting: *Deleted

## 2016-04-30 DIAGNOSIS — R945 Abnormal results of liver function studies: Secondary | ICD-10-CM

## 2016-04-30 DIAGNOSIS — R7989 Other specified abnormal findings of blood chemistry: Secondary | ICD-10-CM

## 2016-04-30 DIAGNOSIS — E785 Hyperlipidemia, unspecified: Secondary | ICD-10-CM

## 2016-04-30 MED ORDER — ROSUVASTATIN CALCIUM 20 MG PO TABS: 20.0000 mg | ORAL_TABLET | Freq: Every day | ORAL | 3 refills | Status: DC

## 2016-04-30 NOTE — Telephone Encounter (Signed)
Informed Dr Claiborne Billings these are repeat labs to the patient's ^ LFT's on the last lab test. At that time he changed him from simvastatin to rosuvastatin. He then gave verbal orders for patient to continue current medication and repeat liver panel in 4 weeks. Patient notified.

## 2016-04-30 NOTE — Telephone Encounter (Signed)
-----   Message from Troy Sine, MD sent at 04/20/2016  9:59 PM EDT ----- Lipid studies are excellent; AST is minimally elevated.  Decrease simvastatin from 40 mg to 20 mg.  Recheck LFTs in 4-6 weeks.

## 2016-06-11 ENCOUNTER — Telehealth: Payer: Self-pay | Admitting: Cardiovascular Disease

## 2016-06-11 ENCOUNTER — Other Ambulatory Visit: Payer: Self-pay

## 2016-06-11 MED ORDER — ATENOLOL 50 MG PO TABS: 75.0000 mg | ORAL_TABLET | Freq: Every day | ORAL | 6 refills | Status: DC

## 2016-06-11 NOTE — Telephone Encounter (Signed)
New Message   *STAT* If patient is at the pharmacy, call can be transferred to refill team.   1. Which medications need to be refilled? (please list name of each medication and dose if known)  atenolol (TENORMIN) 50 MG tablet  Take 1.5 tablets (75 mg total) by mouth daily  2. Which pharmacy/location (including street and city if local pharmacy) is medication to be sent to? [facility] [place]   3. Do they need a 30 day or 90 day supply? 30 day supply

## 2016-06-11 NOTE — Telephone Encounter (Signed)
Tenormin refill sent to pharmacy.

## 2016-08-15 ENCOUNTER — Telehealth (HOSPITAL_COMMUNITY): Payer: Self-pay

## 2016-08-15 NOTE — Telephone Encounter (Signed)
Unable to reach patient. Encounter complete. 

## 2016-08-19 ENCOUNTER — Telehealth (HOSPITAL_COMMUNITY): Payer: Self-pay

## 2016-08-19 NOTE — Telephone Encounter (Signed)
Encounter complete. 

## 2016-08-20 ENCOUNTER — Ambulatory Visit (HOSPITAL_COMMUNITY)
Admission: RE | Admit: 2016-08-20 | Discharge: 2016-08-20 | Disposition: A | Discharge status: Home / Self Care | Payer: Medicare Other | Source: Ambulatory Visit | Attending: Cardiology | Admitting: Cardiology

## 2016-08-20 DIAGNOSIS — I119 Hypertensive heart disease without heart failure: Secondary | ICD-10-CM | POA: Insufficient documentation

## 2016-08-20 DIAGNOSIS — I2583 Coronary atherosclerosis due to lipid rich plaque: Secondary | ICD-10-CM

## 2016-08-20 DIAGNOSIS — I252 Old myocardial infarction: Secondary | ICD-10-CM | POA: Insufficient documentation

## 2016-08-20 DIAGNOSIS — I2510 Atherosclerotic heart disease of native coronary artery without angina pectoris: Secondary | ICD-10-CM

## 2016-08-20 DIAGNOSIS — Z87891 Personal history of nicotine dependence: Secondary | ICD-10-CM | POA: Diagnosis not present

## 2016-08-20 DIAGNOSIS — I10 Essential (primary) hypertension: Secondary | ICD-10-CM

## 2016-08-20 DIAGNOSIS — G4733 Obstructive sleep apnea (adult) (pediatric): Secondary | ICD-10-CM | POA: Diagnosis not present

## 2016-08-20 DIAGNOSIS — I4510 Unspecified right bundle-branch block: Secondary | ICD-10-CM | POA: Diagnosis not present

## 2016-08-20 DIAGNOSIS — Z951 Presence of aortocoronary bypass graft: Secondary | ICD-10-CM | POA: Insufficient documentation

## 2016-08-20 DIAGNOSIS — Z8249 Family history of ischemic heart disease and other diseases of the circulatory system: Secondary | ICD-10-CM | POA: Insufficient documentation

## 2016-08-20 DIAGNOSIS — R9439 Abnormal result of other cardiovascular function study: Secondary | ICD-10-CM | POA: Insufficient documentation

## 2016-08-20 LAB — MYOCARDIAL PERFUSION IMAGING
CHL CUP NUCLEAR SSS: 9
CHL CUP RESTING HR STRESS: 57 {beats}/min
CSEPPHR: 69 {beats}/min
LV sys vol: 97 mL
LVDIAVOL: 151 mL (ref 62–150)
SDS: 0
SRS: 9
TID: 1.27

## 2016-08-20 MED ORDER — TECHNETIUM TC 99M TETROFOSMIN IV KIT
10.1000 | PACK | Freq: Once | INTRAVENOUS | Status: AC | PRN
  Administered 2016-08-20: 10.1 via INTRAVENOUS
  Filled 2016-08-20: qty 11

## 2016-08-20 MED ORDER — TECHNETIUM TC 99M TETROFOSMIN IV KIT
32.0000 | PACK | Freq: Once | INTRAVENOUS | Status: AC | PRN
  Administered 2016-08-20: 32 via INTRAVENOUS
  Filled 2016-08-20: qty 32

## 2016-08-20 MED ORDER — REGADENOSON 0.4 MG/5ML IV SOLN
0.4000 mg | Freq: Once | INTRAVENOUS | Status: AC
  Administered 2016-08-20: 0.4 mg via INTRAVENOUS

## 2016-08-25 ENCOUNTER — Telehealth: Payer: Self-pay | Admitting: *Deleted

## 2016-08-25 NOTE — Telephone Encounter (Signed)
-----   Message from Troy Sine, MD sent at 08/24/2016 11:45 PM EDT ----- Intermediate risk study demonstrating reduced EF at 36% with moderate scar apically without associated ischemia.  There is mild transient ischemic dilation.

## 2016-08-25 NOTE — Telephone Encounter (Signed)
Home phone # invalid,  Left message to call back on cell phone voicemail.

## 2016-09-18 NOTE — Telephone Encounter (Signed)
Left message to call back  

## 2016-10-02 NOTE — Telephone Encounter (Signed)
Left message to call back  

## 2016-10-09 ENCOUNTER — Ambulatory Visit (INDEPENDENT_AMBULATORY_CARE_PROVIDER_SITE_OTHER): Payer: Medicare Other | Admitting: Cardiovascular Disease

## 2016-10-09 ENCOUNTER — Encounter: Payer: Self-pay | Admitting: Cardiovascular Disease

## 2016-10-09 VITALS — BP 157/83 | HR 65 | Ht 73.0 in | Wt 295.8 lb

## 2016-10-09 DIAGNOSIS — G4733 Obstructive sleep apnea (adult) (pediatric): Secondary | ICD-10-CM | POA: Diagnosis not present

## 2016-10-09 DIAGNOSIS — R9439 Abnormal result of other cardiovascular function study: Secondary | ICD-10-CM | POA: Diagnosis not present

## 2016-10-09 DIAGNOSIS — I2583 Coronary atherosclerosis due to lipid rich plaque: Secondary | ICD-10-CM

## 2016-10-09 DIAGNOSIS — E782 Mixed hyperlipidemia: Secondary | ICD-10-CM | POA: Diagnosis not present

## 2016-10-09 DIAGNOSIS — Z79899 Other long term (current) drug therapy: Secondary | ICD-10-CM | POA: Diagnosis not present

## 2016-10-09 DIAGNOSIS — Z9989 Dependence on other enabling machines and devices: Secondary | ICD-10-CM

## 2016-10-09 DIAGNOSIS — R600 Localized edema: Secondary | ICD-10-CM | POA: Diagnosis not present

## 2016-10-09 DIAGNOSIS — I2510 Atherosclerotic heart disease of native coronary artery without angina pectoris: Secondary | ICD-10-CM | POA: Diagnosis not present

## 2016-10-09 DIAGNOSIS — I10 Essential (primary) hypertension: Secondary | ICD-10-CM

## 2016-10-09 MED ORDER — FUROSEMIDE 40 MG PO TABS: ORAL_TABLET | ORAL | 3 refills | Status: DC

## 2016-10-09 MED ORDER — LISINOPRIL 30 MG PO TABS: 30.0000 mg | ORAL_TABLET | Freq: Every day | ORAL | 3 refills | Status: DC

## 2016-10-09 NOTE — Patient Instructions (Signed)
Medication Instructions:  INCREASE lisinopril to 30 mg daily  INCREASE furosemide (Lasix) to 40mg  in the AM and 20 mg in the PM  Labwork: Please return for FASTING labs in 2 WEEKS (CMET, CBC, Lipid, TSH)  Our in office lab hours are Monday-Friday 8:00-4:30, closed for lunch 1-2 pm.  No appointment needed.   Testing/Procedures: Your physician has requested that you have an echocardiogram. Echocardiography is a painless test that uses sound waves to create images of your heart. It provides your doctor with information about the size and shape of your heart and how well your heart's chambers and valves are working. This procedure takes approximately one hour. There are no restrictions for this procedure.  This will be done at our [facility] location:  [place]: Your physician recommends that you schedule a follow-up appointment in: 4-6 weeks with Dr. Claiborne Billings.   If you need a refill on your cardiac medications before your next appointment, please call your pharmacy.

## 2016-10-09 NOTE — Progress Notes (Signed)
Patient ID: Joseph Bryan, male   DOB: 13-Apr-1940, 76 y.o.   MRN: [id number]     Primary M.D.: Dr. Claris Gower  HPI: Joseph Bryan is a 76 y.o. male who presents to the office today for a 7 month followup cardiology evaluation.  Joseph Bryan has CAD and suffered an anterior wall myocardial infarction in November 1998. He underwent primary PTCA of the totally occluded proximal LAD done by me. Due to the concomitant high-grade distal left main stenosis with ostial encroachment to a circumflex vessel following stabilization from his initial intervention and significant myocardial salvage he underwent elective CABG surgery (LIMA to LAD, vein to diagonal, vein to third marginal vessel). He did develop postoperative DVT for which he did require Coumadin therapy. His last catheterization in 2010 showed patent grafts. He has a history of hyperlipidemia also peripheral neuropathy. Two years ago he was admitted to [facility] long hospital with abdominal pain and I saw him for preoperative preoperative evaluation.  A preoperative 2-D echo Doppler study showed normal systolic function with mild/moderate tricuspid regurgitation. Mild pulmonary hypertension with a PA pressure 33 mm. He was found to have colonic ischemia with presumed appendix inflammation.  From a cardiac standpoint, he ultimately tolerated his abdominal surgery where he was found to have colonic ischemia and underwent laparoscopic ileo-colectomy by Dr. [name].   A nuclear perfusion which on 11/16/2012 revealed a perfusion defect in the mid to distal anteroapical, apical, inferoapical walls consistent with distal LAD territory scar. Ejection fraction was 54% there was mild anteroapical hypokinesis. This was not significantly changed from his last nuclear study in 2010.  Additional problems include  a history of sleep apnea and uses CPAP therapy 100% of the time. uses [employer] for his DME company.  He admits to 100% compliance.  He denies any  awareness of breakthrough snoring.  He denies any hypersomnolence.  His sleep is restorative.  Since I last saw him, he has been on an antihypertensive medical regimen consisting of atenolol 100 mg, lisinopril 20 mg, and furosemide 40 mg daily.  This dose of furosemide has led to resolution of prior leg swelling.  He also has been on simvastatin 40 mg for hyperlipidemia.  He denies chest pain, PND orthopnea.  He denies presyncope.  He had been taking low-dose aspirin 2 tablets daily.  I recommended reduction to 81 mg.  He is unaware of any bleeding.  He does admit to low back discomfort.  He is been found on CT imaging to have bulging disks.  He has seen Dr. [name] for neurosurgical evaluation has undergone epidural injections.  When I last saw him his atenolol dose had been reduced by his primary physician from 100 mg down to 50 mg.  He was on atenolol 50 mg and lisinopril 20 mg as well as furosemide 40 mg for hypertension.  He denies any significant edema.  He was tachycardic and I titrated atenolol up to 75 mg.  He continued to be on simvastatin for hyperlipidemia.  He has obstructive sleep apnea and admitted to 100% compliance with CPAP therapy.  Prescription was written for new supplies with his DME company of [employer] [facility] [employer].  Over the last 7 months, he has been without chest pain, shortness of breath or palpitations.  He walks around the house and cuts grass.  He does note some mild swelling in his legs, left greater than right, which has been occurring daily.  He continues to use CPAP.  He underwent an  nuclear study on 08/20/2016.  This now demonstrated an ejection fraction of 36% with moderate scar apically without associated ischemia.  Was a suggestion of possible mild transient ischemic dilation.  He continues to be asymptomatic and denies significant shortness of breath or chest pain.  He presents for reevaluation.   Past Medical History:  Diagnosis Date  . 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 Coronary artery disease   . Heart disease   . Hypertension   . Liver disease    pt denies  . Lung disease    pt denies  . Myocardial infarction ([place]) 17 yrs ago  . Neuropathy    feet  . Sleep apnea with use of continuous positive airway pressure (CPAP)     Past Surgical History:  Procedure Laterality Date  . APPENDECTOMY  2014  . COLON RESECTION N/A 08/27/2012   Procedure: Diagnostic laparoscopy; Ileocecectomy;  Surgeon: Madilyn Hook, DO;  Location: [facility];  Service: General;  Laterality: N/A;  . COLONOSCOPY WITH PROPOFOL N/A 04/09/2015   Procedure: COLONOSCOPY WITH PROPOFOL;  Surgeon: Garlan Fair, MD;  Location: [facility] ENDOSCOPY;  Service: Endoscopy;  Laterality: N/A;  . CORONARY ARTERY BYPASS GRAFT  17 yrs ago   x 5  . LUMBAR LAMINECTOMY/DECOMPRESSION MICRODISCECTOMY N/A 10/19/2015   Procedure: Lumbar three to four LAMINECTOMY/FORAMINOTOMY;  Surgeon: Erline Levine, MD;  Location: [facility];  Service: Neurosurgery;  Laterality: N/A;  . LUNG SURGERY  yrs ago   right and left lungs - "had air pockets"    No Known Allergies  Current Outpatient Prescriptions  Medication Sig Dispense Refill  . atenolol (TENORMIN) 50 MG tablet Take 1.5 tablets (75 mg total) by mouth daily. 45 tablet 6  . Bee Pollen 580 MG CAPS Take 3 capsules by mouth daily.     . furosemide (LASIX) 40 MG tablet Take 74m (1 tablet) in the AM and 231m(1/2 tablet) in the evening. 135 tablet 3  . Glucosamine-Chondroitin (OSTEO BI-FLEX REGULAR STRENGTH PO) Take 2 capsules by mouth daily.     . Marland Kitchenatanoprost (XALATAN) 0.005 % ophthalmic solution Place 1 drop into both eyes at bedtime.    . Marland Kitchenisinopril (PRINIVIL,ZESTRIL) 30 MG tablet Take 1 tablet (30 mg total) by mouth daily. 90 tablet 3  . methocarbamol (ROBAXIN) 500 MG tablet Take 1 tablet (500 mg total) by mouth every 6 (six) hours as needed for muscle spasms. 40 tablet 3  . rosuvastatin  (CRESTOR) 20 MG tablet Take 1 tablet (20 mg total) by mouth daily. 90 tablet 3   No current facility-administered medications for this visit.     Social History   Social History  . Marital status: Married    Spouse name: N/A  . Number of children: N/A  . Years of education: N/A   Occupational History  . Not on file.   Social History Main Topics  . Smoking status: Former Smoker    Types: Cigarettes    Quit date: 08/25/1988  . Smokeless tobacco: Never Used  . Alcohol use No  . Drug use: No  . Sexual activity: Not on file   Other Topics Concern  . Not on file   Social History Narrative  . No narrative on file    Family History  Problem Relation Age of Onset  . Hypertension Father   . Hypertension Sister   . Heart attack Brother  He is married has 3 children 4 grandchildren 2 great-grandchildren. There is remote tobacco history.  ROS General: Negative; No fevers, chills, or night sweats;  HEENT: Negative; No changes in vision or hearing, sinus congestion, difficulty swallowing Pulmonary: Negative; No cough, wheezing, shortness of breath, hemoptysis Cardiovascular: Negative; No chest pain, presyncope, syncope, palpitations Positive for leg swelling GI: Negative; No nausea, vomiting, diarrhea, or abdominal pain GU: Negative; No dysuria, hematuria, or difficulty voiding Musculoskeletal: Positive for low back pain and herniated disks; he walks with a cane Hematologic/Oncology: Negative; no easy bruising, bleeding Endocrine: Negative; no heat/cold intolerance; no diabetes Neuro: Negative; no changes in balance, headaches Skin: Negative; No rashes or skin lesions Psychiatric: Negative; No behavioral problems, depression Sleep: Positive for obstructive sleep apnea on CPAP therapy.  No snoring, daytime sleepiness, hypersomnolence, bruxism, restless legs, hypnogognic hallucinations, no cataplexy Other comprehensive 14 point system review is negative.   PE BP (!) 157/83    Pulse 65   Ht 6' 1" (1.854 m)   Wt 295 lb 12.8 oz (134.2 kg)   BMI 39.03 kg/m    Repeat blood pressure by me was 144/88  Wt Readings from Last 3 Encounters:  10/09/16 295 lb 12.8 oz (134.2 kg)  08/20/16 285 lb (129.3 kg)  03/05/16 285 lb 3.2 oz (129.4 kg)    General: Alert, oriented, no distress.  Skin: normal turgor, no rashes, warm and dry HEENT: Normocephalic, atraumatic. Pupils equal round and reactive to light; sclera anicteric; extraocular muscles intact; Nose without nasal septal hypertrophy Mouth/Parynx benign; Mallinpatti scale 3 Neck: No JVD, no carotid bruits; normal carotid upstroke Lungs: clear to ausculatation and percussion; no wheezing or rales Chest wall: without tenderness to palpitation Heart: PMI not displaced, RRR, s1 s2 normal, 1/6 systolic murmur, no diastolic murmur, no rubs, gallops, thrills, or heaves Abdomen: soft, nontender; no hepatosplenomehaly, BS+; abdominal aorta nontender and not dilated by palpation. Back: no CVA tenderness Pulses 2+ Musculoskeletal: full range of motion, normal strength, no joint deformities Extremities: 1+ pretibial edema, left greater than right; no clubbing cyanosis, Homan's sign negative  Neurologic: grossly nonfocal; Cranial nerves grossly wnl Psychologic: Normal mood and affect   ECG (independently read by me): Normal sinus rhythm at 65 bpm.  Right bundle-branch block with repolarization changes.  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  LVH by voltage.  Normal intervals.  No ectopy.  February 2018 ECG (independently read by me): Sinus tachycardia at 102 bpm, right bundle branch block, left anterior hemiblock.  LVH.  February 2017 ECG (independently read by me): Normal sinus rhythm at 63 bpm.  Right bundle-branch block with repolarization changes.  Left axis deviation.  Normal intervals.  December 2015 ECG (independently read by me): Sinus bradycardia 56 bpm.  Right bundle branch block with repolarization changes.  Normal  intervals.  December 2014 ECG: Sinus rhythm at 67 beats per minute with right bundle branch block and nonspecific ST-T changes. Qtc 464 msec  LABS: BMP Latest Ref Rng & Units 03/05/2016 10/17/2015 03/06/2015  Glucose 65 - 99 mg/dL 101(H) 89 94  BUN 7 - 25 mg/dL _0 Creatinine 0.70 - 1.18 mg/dL 1.01 0.86 0.91  Sodium 135 - 146 mmol/L 140 140 136  Potassium 3.5 - 5.3 mmol/L 4.7 4.3 3.9  Chloride 98 - 110 mmol/L 100 105 101  CO2 20 - 31 mmol/L _1 Calcium 8.6 - 10.3 mg/dL 9.5 9.3 8.9   Hepatic Function Latest Ref Rng & Units 04/17/2016 03/05/2016 03/06/2015  Total Protein 6.1 -  8.1 g/dL 7.0 7.4 6.6  Albumin 3.6 - 5.1 g/dL 3.6 3.8 3.2(L)  AST 10 - 35 U/L 45(H) 46(H) 32  ALT 9 - 46 U/L 36 34 27  Alk Phosphatase 40 - 115 U/L 160(H) 179(H) 143(H)  Total Bilirubin 0.2 - 1.2 mg/dL 0.6 0.5 0.5  Bilirubin, Direct <=0.2 mg/dL 0.2 - -   CBC Latest Ref Rng & Units 03/05/2016 10/17/2015 03/06/2015  WBC 3.8 - 10.8 K/uL 7.3 8.4 8.4  Hemoglobin 13.2 - 17.1 g/dL 14.1 13.4 13.4  Hematocrit 38.5 - 50.0 % 43.4 42.3 41.7  Platelets 140 - 400 K/uL 222 195 245   Lab Results  Component Value Date   TSH 4.46 03/05/2016   Lab Results  Component Value Date   MCV 89.3 03/05/2016   MCV 95.1 10/17/2015   MCV 92.9 03/06/2015   Lipid Panel     Component Value Date/Time   CHOL 129 04/17/2016 1112   TRIG 88 04/17/2016 1112   HDL 61 04/17/2016 1112   CHOLHDL 2.1 04/17/2016 1112   VLDL 18 04/17/2016 1112   LDLCALC 50 04/17/2016 1112     RADIOLOGY: No results found.  IMPRESSION:  1. Coronary artery disease due to lipid rich plaque   2. Essential hypertension   3. OSA on CPAP   4. Mixed hyperlipidemia   5. Abnormal nuclear stress test   6. Medication management   7. Bilateral leg edema       ASSESSMENT AND PLAN: Joseph Bryan is a 76-year-old white male who is 20 years status post suffering an anterior wall myocardial infarction at which time he was treated with PTCA of a totally occluded  LAD in 1998. Due to severe  left main stenosis he underwent CABG surgery with a LIMA to his LAD, vein to diagonal, and vein to the third marginal vessel. He has been documented have significant salvage of myocardium from his acute PTCA. An echo Doppler study  done preoperatively showed a normal  ejection fraction  and he did have mild to moderate TR.  A nuclear perfusion study in 2014 shows distal LAD territory scar concordant with his prior myocardial infarction. Ejection fraction was 54%. There was no region of ischemia. This was unchanged from his previous study in 2010.  When I last saw him, he was tachycardic after his primary physician had reduced his atenolol and I further titrated this to 75 mg daily.  I referred him for a nuclear stress test, which now demonstrate an EF of 36% with scar and raised the possibility of mild transient ischemic dilation.  I personally reviewed the myocardial perfusion images.  He admits to being asymptomatic.  I'm scheduling him for an echo Doppler study to confirm his drop in ejection fraction.  I will further titrate lisinopril to 30 mg daily.  He has peripheral edema bilaterally and I am increasing furosemide to 40 mg in the morning and 20 mg before dinner.  He will have repeat lab work 2 months.  He continues to be on rosuvastatin for hyperlipidemia.  His blood pressure today was elevated, which should improve with the increased lisinopril and diuretic regimen.  He received a new supplies and continues to use CPAP with 100% compliance.  I will see him in 6 weeks for reevaluation.  Time spent: 25 minutes Thomas A. Kelly, MD, FACC  10/11/2016 11:32 AM    

## 2016-10-14 NOTE — Telephone Encounter (Signed)
Reviewed at [place] with Dr. Claiborne Billings

## 2016-10-17 ENCOUNTER — Ambulatory Visit (HOSPITAL_COMMUNITY): Payer: Medicare Other | Attending: Cardiovascular Disease

## 2016-10-17 ENCOUNTER — Other Ambulatory Visit: Payer: Self-pay

## 2016-10-17 DIAGNOSIS — I2583 Coronary atherosclerosis due to lipid rich plaque: Secondary | ICD-10-CM | POA: Diagnosis present

## 2016-10-17 DIAGNOSIS — I082 Rheumatic disorders of both aortic and tricuspid valves: Secondary | ICD-10-CM | POA: Insufficient documentation

## 2016-10-17 DIAGNOSIS — E785 Hyperlipidemia, unspecified: Secondary | ICD-10-CM | POA: Insufficient documentation

## 2016-10-17 DIAGNOSIS — I10 Essential (primary) hypertension: Secondary | ICD-10-CM | POA: Diagnosis not present

## 2016-10-17 DIAGNOSIS — I2510 Atherosclerotic heart disease of native coronary artery without angina pectoris: Secondary | ICD-10-CM | POA: Diagnosis not present

## 2016-10-17 DIAGNOSIS — R9439 Abnormal result of other cardiovascular function study: Secondary | ICD-10-CM | POA: Insufficient documentation

## 2016-10-17 DIAGNOSIS — I252 Old myocardial infarction: Secondary | ICD-10-CM | POA: Diagnosis not present

## 2016-10-17 MED ORDER — PERFLUTREN LIPID MICROSPHERE
1.0000 mL | INTRAVENOUS | Status: AC | PRN
  Administered 2016-10-17: 3 mL via INTRAVENOUS

## 2016-10-29 LAB — COMPREHENSIVE METABOLIC PANEL
A/G RATIO: 1.1 — AB (ref 1.2–2.2)
ALT: 41 IU/L (ref 0–44)
AST: 47 IU/L — AB (ref 0–40)
Albumin: 3.9 g/dL (ref 3.5–4.8)
Alkaline Phosphatase: 180 IU/L — ABNORMAL HIGH (ref 39–117)
BUN/Creatinine Ratio: 11 (ref 10–24)
BUN: 11 mg/dL (ref 8–27)
Bilirubin Total: 0.6 mg/dL (ref 0.0–1.2)
CALCIUM: 9.4 mg/dL (ref 8.6–10.2)
CO2: 27 mmol/L (ref 20–29)
Chloride: 98 mmol/L (ref 96–106)
Creatinine, Ser: 1.04 mg/dL (ref 0.76–1.27)
GFR, EST AFRICAN AMERICAN: 80 mL/min/{1.73_m2} (ref >59)
GFR, EST NON AFRICAN AMERICAN: 69 mL/min/{1.73_m2} (ref >59)
Globulin, Total: 3.5 g/dL (ref 1.5–4.5)
Glucose: 93 mg/dL (ref 65–99)
POTASSIUM: 5 mmol/L (ref 3.5–5.2)
SODIUM: 140 mmol/L (ref 134–144)
TOTAL PROTEIN: 7.4 g/dL (ref 6.0–8.5)

## 2016-10-29 LAB — CBC
HEMATOCRIT: 42.5 % (ref 37.5–51.0)
Hemoglobin: 14 g/dL (ref 13.0–17.7)
MCH: 30.1 pg (ref 26.6–33.0)
MCHC: 32.9 g/dL (ref 31.5–35.7)
MCV: 91 fL (ref 79–97)
Platelets: 199 10*3/uL (ref 150–379)
RBC: 4.65 x10E6/uL (ref 4.14–5.80)
RDW: 14.4 % (ref 12.3–15.4)
WBC: 7.6 10*3/uL (ref 3.4–10.8)

## 2016-10-29 LAB — LIPID PANEL
CHOL/HDL RATIO: 2.3 ratio (ref 0.0–5.0)
Cholesterol, Total: 144 mg/dL (ref 100–199)
HDL: 64 mg/dL (ref >39)
LDL Calculated: 63 mg/dL (ref 0–99)
Triglycerides: 87 mg/dL (ref 0–149)
VLDL Cholesterol Cal: 17 mg/dL (ref 5–40)

## 2016-10-29 LAB — TSH: TSH: 5.43 u[IU]/mL — AB (ref 0.450–4.500)

## 2016-11-24 ENCOUNTER — Other Ambulatory Visit: Payer: Self-pay

## 2016-11-24 ENCOUNTER — Telehealth: Payer: Self-pay

## 2016-11-24 DIAGNOSIS — R7989 Other specified abnormal findings of blood chemistry: Secondary | ICD-10-CM

## 2016-11-24 DIAGNOSIS — R945 Abnormal results of liver function studies: Secondary | ICD-10-CM

## 2016-11-24 DIAGNOSIS — I10 Essential (primary) hypertension: Secondary | ICD-10-CM

## 2016-11-24 DIAGNOSIS — Z79899 Other long term (current) drug therapy: Secondary | ICD-10-CM

## 2016-11-24 MED ORDER — ROSUVASTATIN CALCIUM 10 MG PO TABS: 10.0000 mg | ORAL_TABLET | Freq: Every day | ORAL | 3 refills | Status: DC

## 2016-11-24 NOTE — Telephone Encounter (Signed)
-----   Message from Troy Sine, MD sent at 11/23/2016  6:19 PM EST ----- CBC and chemistry are stable except continued slight increase in alkaline phosphatase and minimal AST elevation.  Consider decreased rosuvastatin from 20 down to 10 mg.  Repeat LFTs in one month.  TSH is borderline increased.  Show primary M.D.

## 2016-11-25 ENCOUNTER — Ambulatory Visit: Payer: Medicare Other | Admitting: Physician Assistant

## 2016-12-03 ENCOUNTER — Other Ambulatory Visit (HOSPITAL_COMMUNITY): Payer: Self-pay | Admitting: Neurosurgery

## 2016-12-03 DIAGNOSIS — M5416 Radiculopathy, lumbar region: Secondary | ICD-10-CM

## 2016-12-05 ENCOUNTER — Ambulatory Visit (HOSPITAL_COMMUNITY)
Admission: RE | Admit: 2016-12-05 | Discharge: 2016-12-05 | Disposition: A | Discharge status: Home / Self Care | Payer: Medicare Other | Source: Ambulatory Visit | Attending: Neurosurgery | Admitting: Neurosurgery

## 2016-12-05 DIAGNOSIS — M5116 Intervertebral disc disorders with radiculopathy, lumbar region: Secondary | ICD-10-CM | POA: Diagnosis not present

## 2016-12-05 DIAGNOSIS — M5416 Radiculopathy, lumbar region: Secondary | ICD-10-CM | POA: Diagnosis present

## 2016-12-05 DIAGNOSIS — M48061 Spinal stenosis, lumbar region without neurogenic claudication: Secondary | ICD-10-CM | POA: Insufficient documentation

## 2016-12-05 MED ORDER — GADOBENATE DIMEGLUMINE 529 MG/ML IV SOLN
20.0000 mL | Freq: Once | INTRAVENOUS | Status: AC | PRN
  Administered 2016-12-05: 20 mL via INTRAVENOUS

## 2016-12-12 ENCOUNTER — Other Ambulatory Visit: Payer: Self-pay | Admitting: Neurosurgery

## 2017-01-09 NOTE — Pre-Procedure Instructions (Signed)
Daxson H Ashmore  01/09/2017      [facility] - [place] - [address] [address] [place] [address] Phone: [phone] Fax: [phone]    Your procedure is scheduled on 01-16-2017 Friday   Report to [facility] Admitting at 8:20 A.M.   Call this number if you have problems the morning of surgery:  [phone]   Remember:  Do not eat food or drink liquids after midnight.   Take these medicines the morning of surgery with A SIP OF WATER    Atenolol(Tenormin) Hydrocodone if needed Methylprednisolone(Medrol) Rosuvastatin(Crestor) STOP ASPIRIN,ANTIINFLAMATORIES (IBUPROFEN,ALEVE,MOTRIN,ADVIL,GOODY'S POWDERS),HERBAL SUPPLEMENTS,FISH OIL,AND VITAMINS 5-7 DAYS PRIOR TO SURGERY    Do not wear jewelry, make-up or nail polish.  Do not wear lotions, powders, or perfumes, or deodorant.  Do not shave 48 hours prior to surgery.  Men may shave face and neck.  Do not bring valuables to the hospital.  [facility] is not responsible for any belongings or valuables.  Contacts, dentures or bridgework may not be worn into surgery.  Leave your suitcase in the car.  After surgery it may be brought to your room.  For patients admitted to the hospital, discharge time will be determined by your treatment team.  Patients discharged the day of surgery will not be allowed to drive home.     Special Instructions: [facility] - Preparing for Surgery  Before surgery, you can play an important role.  Because skin is not sterile, your skin needs to be as free of germs as possible.  You can reduce the number of germs on you skin by washing with CHG (chlorahexidine gluconate) soap before surgery.  CHG is an antiseptic cleaner which kills germs and bonds with the skin to continue killing germs even after washing.  Please DO NOT use if you have an allergy to CHG or antibacterial soaps.  If your skin becomes reddened/irritated stop  using the CHG and inform your nurse when you arrive at Short Stay.  Do not shave (including legs and underarms) for at least 48 hours prior to the first CHG shower.  You may shave your face.  Please follow these instructions carefully:   1.  Shower with CHG Soap the night before surgery and the   morning of Surgery.  2.  If you choose to wash your hair, wash your hair first as usual with your normal shampoo.  3.  After you shampoo, rinse your hair and body thoroughly to remove the  Shampoo.  4.  Use CHG as you would any other liquid soap.  You can apply chg directly  to the skin and wash gently with scrungie or a clean washcloth.  5.  Apply the CHG Soap to your body ONLY FROM THE NECK DOWN.   Do not use on open wounds or open sores.  Avoid contact with your eyes,  ears, mouth and genitals (private parts).  Wash genitals (private parts) with your normal soap.  6.  Wash thoroughly, paying special attention to the area where your surgery will be performed.  7.  Thoroughly rinse your body with warm water from the neck down.  8.  DO NOT shower/wash with your normal soap after using and rinsing o  the CHG Soap.  9.  Pat yourself dry with a clean towel.            10.  Wear clean pajamas.  11.  Place clean sheets on your bed the night of your first shower and do not sleep with pets.  Day of Surgery  Do not apply any lotions/deodorants the morning of surgery.  Please wear clean clothes to the hospital/surgery center.   Please read over the following fact sheets that you were given. MRSA Information and Surgical Site Infection Prevention

## 2017-01-12 ENCOUNTER — Encounter (HOSPITAL_COMMUNITY)
Admission: RE | Admit: 2017-01-12 | Discharge: 2017-01-12 | Disposition: A | Discharge status: Home / Self Care | Payer: Medicare Other | Source: Ambulatory Visit | Attending: Neurosurgery | Admitting: Neurosurgery

## 2017-01-12 ENCOUNTER — Other Ambulatory Visit (HOSPITAL_COMMUNITY): Payer: Self-pay | Admitting: *Deleted

## 2017-01-12 ENCOUNTER — Encounter (HOSPITAL_COMMUNITY): Payer: Self-pay

## 2017-01-12 ENCOUNTER — Telehealth: Payer: Self-pay | Admitting: *Deleted

## 2017-01-12 ENCOUNTER — Other Ambulatory Visit: Payer: Self-pay

## 2017-01-12 DIAGNOSIS — R945 Abnormal results of liver function studies: Secondary | ICD-10-CM | POA: Insufficient documentation

## 2017-01-12 DIAGNOSIS — G4733 Obstructive sleep apnea (adult) (pediatric): Secondary | ICD-10-CM | POA: Insufficient documentation

## 2017-01-12 DIAGNOSIS — R600 Localized edema: Secondary | ICD-10-CM | POA: Insufficient documentation

## 2017-01-12 DIAGNOSIS — R601 Generalized edema: Secondary | ICD-10-CM | POA: Insufficient documentation

## 2017-01-12 DIAGNOSIS — M48062 Spinal stenosis, lumbar region with neurogenic claudication: Secondary | ICD-10-CM | POA: Diagnosis not present

## 2017-01-12 DIAGNOSIS — I2510 Atherosclerotic heart disease of native coronary artery without angina pectoris: Secondary | ICD-10-CM | POA: Insufficient documentation

## 2017-01-12 DIAGNOSIS — Z9989 Dependence on other enabling machines and devices: Secondary | ICD-10-CM | POA: Insufficient documentation

## 2017-01-12 DIAGNOSIS — E785 Hyperlipidemia, unspecified: Secondary | ICD-10-CM | POA: Insufficient documentation

## 2017-01-12 DIAGNOSIS — R1031 Right lower quadrant pain: Secondary | ICD-10-CM | POA: Insufficient documentation

## 2017-01-12 DIAGNOSIS — I4510 Unspecified right bundle-branch block: Secondary | ICD-10-CM | POA: Diagnosis not present

## 2017-01-12 DIAGNOSIS — Z01812 Encounter for preprocedural laboratory examination: Secondary | ICD-10-CM | POA: Insufficient documentation

## 2017-01-12 DIAGNOSIS — I10 Essential (primary) hypertension: Secondary | ICD-10-CM | POA: Insufficient documentation

## 2017-01-12 HISTORY — DX: Hyperlipidemia, unspecified: E78.5

## 2017-01-12 HISTORY — DX: Unspecified glaucoma: H40.9

## 2017-01-12 LAB — COMPREHENSIVE METABOLIC PANEL
ALK PHOS: 170 U/L — AB (ref 38–126)
ALT: 85 U/L — AB (ref 17–63)
AST: 68 U/L — AB (ref 15–41)
Albumin: 3.6 g/dL (ref 3.5–5.0)
Anion gap: 8 (ref 5–15)
BUN: 12 mg/dL (ref 6–20)
CALCIUM: 9.5 mg/dL (ref 8.9–10.3)
CHLORIDE: 101 mmol/L (ref 101–111)
CO2: 26 mmol/L (ref 22–32)
CREATININE: 0.89 mg/dL (ref 0.61–1.24)
GFR calc non Af Amer: >60 mL/min (ref >60)
GLUCOSE: 92 mg/dL (ref 65–99)
Potassium: 4.3 mmol/L (ref 3.5–5.1)
SODIUM: 135 mmol/L (ref 135–145)
Total Bilirubin: 0.8 mg/dL (ref 0.3–1.2)
Total Protein: 7 g/dL (ref 6.5–8.1)

## 2017-01-12 LAB — CBC
HEMATOCRIT: 42.9 % (ref 39.0–52.0)
HEMOGLOBIN: 13.5 g/dL (ref 13.0–17.0)
MCH: 30.4 pg (ref 26.0–34.0)
MCHC: 31.5 g/dL (ref 30.0–36.0)
MCV: 96.6 fL (ref 78.0–100.0)
Platelets: 163 10*3/uL (ref 150–400)
RBC: 4.44 MIL/uL (ref 4.22–5.81)
RDW: 14.6 % (ref 11.5–15.5)
WBC: 10 10*3/uL (ref 4.0–10.5)

## 2017-01-12 LAB — TYPE AND SCREEN
ABO/RH(D): A POS
Antibody Screen: NEGATIVE

## 2017-01-12 LAB — SURGICAL PCR SCREEN
MRSA, PCR: NEGATIVE
Staphylococcus aureus: POSITIVE — AB

## 2017-01-12 LAB — ABO/RH: ABO/RH(D): A POS

## 2017-01-12 NOTE — Telephone Encounter (Signed)
Pt has ben made aware that he needs a f/u appt with Dr. Evette Georges office, before he can be cleared for his upcoming surgery. Pt has been scheduled to see Robbie Lis, PA-C 01/14/17. Pt thanked me for the call.

## 2017-01-12 NOTE — Progress Notes (Signed)
Cardiologist Dr. Shelva Majestic last ov 10-09-2016  Echo 10-17-2016  Stress Test 08-20-2016

## 2017-01-12 NOTE — Telephone Encounter (Signed)
   Primary Cardiologist:Thomas Claiborne Billings, MD  Chart reviewed as part of pre-operative protocol coverage.  Patient has h/o CAD prior anterior MI, PTCA of occluded LAD, CABG, colonic ischemia, OSA, hyperlipidemia, HTN. Recent stress test in 08/2016 was abnormal with decreased EF 36% without associated ischemia but mild transient ischemic dilatation, which can be suggestive of ischemia. He was asymptomatic at that time so Dr. Claiborne Billings arranged 2D echo 10/17/16 which showed decreased EF 45-50% with diffuse hypokinesis, also with diastolic dysfunction, mild AI, mild-mod dilated left atrium, moderate TR. Prior LVEF in 2014 was normal, so this was a change from previous. Based on these results, Dr. Claiborne Billings had recommended close follow-up which included appointment 11/25/16 for which patient did not show. He cannot be cleared until he has been re-evaluated in the office to determine whether further ischemic workup is necessary.  Pre-op covering staff: - Please schedule appointment and call patient to inform them of need for appointment to ensure his safety prior to surgery - Please contact requesting surgeon's office to inform them of need for appointment prior to surgery. Will also route this note to Dr. [name] in [place].  Charlie Pitter, PA-C  01/12/2017, 3:02 PM

## 2017-01-12 NOTE — Telephone Encounter (Signed)
   [facility] Pre-operative Risk Assessment    Request for surgical clearance:  1. What type of surgery is being performed? Posterior lumbar fusion   2. When is this surgery scheduled? 01/16/2017   3. Are there any medications that need to be held prior to surgery and how long?   4. Practice name and name of physician performing surgery? [facility], Dr. Erline Levine   5. What is your office phone and fax number? [phone] ext [phone]   6. Anesthesia type (None, local, MAC, general) ?    Joseph [place] Joseph Bryan 01/12/2017, 1:54 PM  _________________________________________________________________   (provider comments below)

## 2017-01-14 ENCOUNTER — Ambulatory Visit: Payer: Medicare Other | Admitting: Physician Assistant

## 2017-01-14 ENCOUNTER — Ambulatory Visit: Payer: Medicare Other | Admitting: Internal Medicine

## 2017-01-14 NOTE — Progress Notes (Addendum)
Anesthesia Chart Review: Patient is a 77 year old male scheduled for L3-4, L4-5 redo decompression/laminectomy with posterior lumbar interbody fusion on 01/16/2017 by Dr. Erline Levine. OR room is booked for approximately 4 hours.   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 LFTs appear chronically mildly elevated, although he denied known history of liver disease--liver appeared normal on 08/2012 CT scan. BMI is consistent with obesity.  - PCP is listed as Dr. Claris Gower. - Cardiologist is Dr. Shelva Majestic. 2018 testing had revealed a decrease in his LVEF with diffuse LV hypokinesis and evidence of transient ischemic dilation on stress test. He was asymptomatic so medications titrated with recommendation for close follow-up. Patient has follow-up with [facility] on 01/14/17 for preoperative evaluation.  Meds include atenolol, Lasix, Norco, lisinopril, Crestor, Xalatan ophthalmic.  BP (!) 149/62   Pulse (!) 57   Temp 36.4 C   Resp 20   Ht _0  (1.854 m)   Wt 286 lb (129.7 kg)   SpO2 96%   BMI 37.73 kg/m   EKG 10/09/16: Normal sinus rhythm, right bundle branch block, left anterior fascicular block, bifascicular block, minimal voltage criteria for LVH, may be normal variant. Septal infarct, age undetermined.  Echo 10/17/16: Study Conclusions - Left ventricle: The cavity size was mildly dilated. There was   mild concentric hypertrophy. Systolic function was mildly   reduced. The estimated ejection fraction was in the range of 45%   to 50%. Diffuse hypokinesis. Doppler parameters are consistent   with abnormal left ventricular relaxation (grade 1 diastolic   dysfunction). Doppler parameters  are consistent with   indeterminate ventricular filling pressure. - Aortic valve: Transvalvular velocity was within the normal range.   There was no stenosis. There was mild regurgitation. - Mitral valve: Transvalvular velocity was within the normal range.   There was no evidence for stenosis. There was trivial   regurgitation. - Left atrium: The atrium was mildly to moderately dilated. - Right ventricle: The cavity size was normal. Wall thickness was   normal. Systolic function was normal. - Tricuspid valve: There was moderate regurgitation. - Pulmonary arteries: Systolic pressure was within the normal   range. PA peak pressure: 35 mm Hg (S).  Nuclear stress test 08/20/16:   The left ventricular ejection fraction is moderately decreased (30-44%).  Nuclear stress EF: 36%.  There was no ST segment deviation noted during stress.  There is a medium defect of moderate severity present in the apical anterior, apical septal and apex location. The defect is non-reversible. This is consistent with infarct. No ischemia noted.  This is an intermediate risk study.  There is evidence of transient ischemic dilatation with a TID of 1.27 which can be seen in multivessel CAD.  Cardiac cath 09/28/03:  - LV systolic function was normal with EF 55%. No regional wall motion abnormalities.  - Large dominant RCA with mild luminal irregularities.  - Moderate size left main with distal 40% stenosis.  - Circumflex with 80% ostial stenosis and gives origin to large obtuse marginal 1 and 2. The OM1 is occluded and supplied by saphenous vein graft. There is competitive filling noted in the distal circumflex coronary artery.  - Large LAD gives origin to large diagonal 1 with 80-90% stenosis. Ostial LAD with 70-80% stenosis.  There is complicated filling and retrograde filling of the LAD via the saphenous vein graft to the diagonal. - GRAFTS: SVG-OM1-OM2 widely patent. SVG-DIAG1 widely patent and retrogradely fills  the LAD and also competitive filling noted in the distal LAD. LIMA to LAD is widely patent.  Preoperative labs noted. Cr 0.89. Glucose 92. Alk Phos 170, AST 68 (previously 40 range), ALT 85 (previously ~ 40). CBC WNL.   Will follow-up cardiology recommendations from 01/14/17 visit.  Joseph Bryan [facility]/Anesthesiology Phone [phone] 01/14/2017 2:00 PM  Addendum: Patient missed 01/14/17 cardiology appointment (wife reported that she was told appointment was on 01/15/17). Dr. Sherren Mocha was willing to work patient in for visit today. He cleared patient to proceed with surgery.  Joseph Bryan [facility]/Anesthesiology Phone [phone] 01/15/2017 4:51 PM

## 2017-01-15 ENCOUNTER — Encounter: Payer: Self-pay | Admitting: Cardiovascular Disease

## 2017-01-15 ENCOUNTER — Encounter: Payer: Self-pay | Admitting: Internal Medicine

## 2017-01-15 ENCOUNTER — Ambulatory Visit: Payer: Medicare Other | Admitting: Cardiovascular Disease

## 2017-01-15 VITALS — BP 128/82 | HR 62 | Ht 73.0 in | Wt 288.6 lb

## 2017-01-15 DIAGNOSIS — Z01818 Encounter for other preprocedural examination: Secondary | ICD-10-CM

## 2017-01-15 DIAGNOSIS — I25810 Atherosclerosis of coronary artery bypass graft(s) without angina pectoris: Secondary | ICD-10-CM | POA: Diagnosis not present

## 2017-01-15 MED ORDER — DEXTROSE 5 % IV SOLN
3.0000 g | INTRAVENOUS | Status: AC
  Administered 2017-01-16 (×2): 3 g via INTRAVENOUS
  Filled 2017-01-15: qty 3

## 2017-01-15 NOTE — Progress Notes (Signed)
Cardiology Office Note Date:  01/15/2017   ID:  Joseph Bryan, Joseph Bryan 22-Jul-1940, MRN [id number]  PCP:  Leonard Downing, MD  Cardiologist:  Sherren Mocha, MD    Chief Complaint  Patient presents with  . Pre-op Exam     History of Present Illness: Joseph Bryan is a 77 y.o. male who presents for preoperative cardiac evaluation.   He is scheduled for lumbar back surgery tomorrow with Dr [name] for treatment of ruptured disks in the back.   He denies any recent cardiac complaints. He is here with his wife today. Today, he denies symptoms of palpitations, chest pain, shortness of breath, orthopnea, PND, lower extremity edema, dizziness, or syncope. Since he's sustained a back injusry about 6 weeks ago, he hasn't been able to do any physical activity. However, prior to this he reports no limitations with yard work or climbing one flight of stairs.   He had severe chest pain back at the time of his MI in 1998 when he was treated with PCI and ultimately CABG. He reports no recent chest discomfort or symptoms like those of his MI.   He has no history of complications related to general anesthesia. His last surgery was an appendectomy in 0962 and had no complications related to this.   Past Medical History:  Diagnosis Date  . Cancer [facility])    appendix- "got it all"  . 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 Coronary artery disease   . Glaucoma   . Heart disease   . Hyperlipidemia   . Hypertension   . Liver disease    pt denies  . Lung disease    pt denies  . Myocardial infarction ([place]) 17 yrs ago  . Neuropathy    feet  . Sleep apnea with use of continuous positive airway pressure (CPAP)    uses CPAP    Past Surgical History:  Procedure Laterality Date  . APPENDECTOMY  2014  . COLON RESECTION N/A 08/27/2012   Procedure: Diagnostic laparoscopy; Ileocecectomy;  Surgeon: Madilyn Hook, DO;  Location: [facility] ORS;  Service: General;  Laterality: N/A;    . COLONOSCOPY WITH PROPOFOL N/A 04/09/2015   Procedure: COLONOSCOPY WITH PROPOFOL;  Surgeon: Garlan Fair, MD;  Location: [facility] ENDOSCOPY;  Service: Endoscopy;  Laterality: N/A;  . CORONARY ARTERY BYPASS GRAFT  17 yrs ago   x 5  . LUMBAR LAMINECTOMY/DECOMPRESSION MICRODISCECTOMY N/A 10/19/2015   Procedure: Lumbar three to four LAMINECTOMY/FORAMINOTOMY;  Surgeon: Erline Levine, MD;  Location: [place];  Service: Neurosurgery;  Laterality: N/A;  . LUNG SURGERY  yrs ago   right and left lungs - "had air pockets"    Current Outpatient Medications  Medication Sig Dispense Refill  . atenolol (TENORMIN) 50 MG tablet Take 1.5 tablets (75 mg total) by mouth daily. (Patient taking differently: Take 50 mg by mouth daily. ) 45 tablet 6  . Bee Pollen 580 MG CAPS Take 3 capsules by mouth daily.     . furosemide (LASIX) 40 MG tablet Take 40mg  (1 tablet) in the AM and 20mg  (1/2 tablet) in the evening. (Patient taking differently: Take 40 mg by mouth daily. ) 135 tablet 3  . Glucosamine-Chondroitin (OSTEO BI-FLEX REGULAR STRENGTH PO) Take 2 capsules by mouth daily.     Marland Kitchen HYDROcodone-acetaminophen (NORCO/VICODIN) 5-325 MG tablet Take 1 tablet by mouth every 6 (six) hours as needed for pain.    Marland Kitchen latanoprost (XALATAN) 0.005 % ophthalmic solution  Place 1 drop into both eyes at bedtime.    Marland Kitchen lisinopril (PRINIVIL,ZESTRIL) 30 MG tablet Take 1 tablet (30 mg total) by mouth daily. 90 tablet 3  . methylPREDNISolone (MEDROL) 4 MG tablet Take 4 mg by mouth daily.    Marland Kitchen pyridOXINE (VITAMIN B-6) 100 MG tablet Take 100 mg by mouth daily.    . rosuvastatin (CRESTOR) 10 MG tablet Take 1 tablet (10 mg total) daily by mouth. 90 tablet 3  . vitamin B-12 (CYANOCOBALAMIN) 1000 MCG tablet Take 1,000 mcg by mouth daily.     No current facility-administered medications for this visit.    Facility-Administered Medications Ordered in Other Visits  Medication Dose Route Frequency Provider Last Rate Last Dose  . [START ON 01/16/2017]  ceFAZolin (ANCEF) 3 g in dextrose 5 % 50 mL IVPB  3 g Intravenous To SS-Surg Erline Levine, MD        Allergies:   Patient has no known allergies.   Social History:  The patient  reports that he quit smoking about 28 years ago. His smoking use included cigarettes. he has never used smokeless tobacco. He reports that he does not drink alcohol or use drugs.   Family History:  The patient's family history includes Heart attack in his brother; Hypertension in his father and sister.    ROS:  Please see the history of present illness.  Otherwise, review of systems is positive for back pain, muscle pain.  All other systems are reviewed and negative.    PHYSICAL EXAM: VS:  BP 128/82   Pulse 62   Ht 6\' 1"  (1.854 m)   Wt 288 lb 9.6 oz (130.9 kg)   BMI 38.08 kg/m  , BMI Body mass index is 38.08 kg/m. GEN: Well nourished, well developed, obese male in no acute distress  HEENT: normal  Neck: no JVD, no masses. No carotid bruits Cardiac: RRR without murmur or gallop      Respiratory:  clear to auscultation bilaterally, normal work of breathing GI: soft, nontender, nondistended, + BS MS: no deformity or atrophy  Ext: no pretibial edema, pedal pulses 2+= bilaterally Skin: warm and dry, no rash Neuro:  Strength and sensation are intact Psych: euthymic mood, full affect  EKG:  EKG is ordered today. The ekg ordered today shows NSR 62 bpm, RBBB, LAFB, no change from last tracing 10-09-2016  Recent Labs: 10/28/2016: TSH 5.430 01/12/2017: ALT 85; BUN 12; Creatinine, Ser 0.89; Hemoglobin 13.5; Platelets 163; Potassium 4.3; Sodium 135   Lipid Panel     Component Value Date/Time   CHOL 144 10/28/2016 1155   TRIG 87 10/28/2016 1155   HDL 64 10/28/2016 1155   CHOLHDL 2.3 10/28/2016 1155   CHOLHDL 2.1 04/17/2016 1112   VLDL 18 04/17/2016 1112   LDLCALC 63 10/28/2016 1155      Wt Readings from Last 3 Encounters:  01/15/17 288 lb 9.6 oz (130.9 kg)  01/12/17 286 lb (129.7 kg)  10/09/16 295 lb  12.8 oz (134.2 kg)     Cardiac Studies Reviewed: 2D Echo 10-17-2016: Study Conclusions  - Left ventricle: The cavity size was mildly dilated. There was   mild concentric hypertrophy. Systolic function was mildly   reduced. The estimated ejection fraction was in the range of 45%   to 50%. Diffuse hypokinesis. Doppler parameters are consistent   with abnormal left ventricular relaxation (grade 1 diastolic   dysfunction). Doppler parameters are consistent with   indeterminate ventricular filling pressure. - Aortic valve: Transvalvular velocity was within  the normal range.   There was no stenosis. There was mild regurgitation. - Mitral valve: Transvalvular velocity was within the normal range.   There was no evidence for stenosis. There was trivial   regurgitation. - Left atrium: The atrium was mildly to moderately dilated. - Right ventricle: The cavity size was normal. Wall thickness was   normal. Systolic function was normal. - Tricuspid valve: There was moderate regurgitation. - Pulmonary arteries: Systolic pressure was within the normal   range. PA peak pressure: 35 mm Hg (S).  Nuclear perfusion study: Study Highlights    The left ventricular ejection fraction is moderately decreased (30-44%).  Nuclear stress EF: 36%.  There was no ST segment deviation noted during stress.  There is a medium defect of moderate severity present in the apical anterior, apical septal and apex location. The defect is non-reversible. This is consistent with infarct. No ischemia noted.  This is an intermediate risk study.  There is evidence of transient ischemic dilatation with a TID of 1.27 which can be seen in multivessel CAD.    ASSESSMENT AND PLAN: 1.  CAD, native vessel, without angina: pt remains stable from a cardiac perspective. He reports no anginal symptoms and tolerates his medical program well. Most recent echo and nuclear stress results reviewed. His nuclear scan shows a fixed  apical defect c/w his known prior infarct. There was a question of transient ischemic dilatation. Continued medical therapy is indicated.  2. Chronic systolic HF: NYHA I symptoms. Primary limitation is secondary to back problems and patient reports no cardiopulmonary symptoms at present. He will continue current Rx.   3. Preop evaluation: pt scheduled for back surgery tomorrow. He can perform >4 mets without symptoms. I agree with Dr Evette Georges recent evaluation that this patient is stable from a cardiac perspective. He can proceed with surgery at low risk of cardiac complication and no further testing is indicated. Our service would be happy to help with his management if any cardiac problems arise.   Current medicines are reviewed with the patient today.  The patient does not have concerns regarding medicines.  Labs/ tests ordered today include:   Orders Placed This Encounter  Procedures  . EKG 12-Lead    Disposition:   FU as planned with Dr Mellissa Kohut, Sherren Mocha, MD  01/15/2017 11:05 PM    [address] [place], [place],   [address] Phone: [phone]; Fax: [phone]

## 2017-01-15 NOTE — H&P (Signed)
Patient ID:   [id number] Patient: Joseph Bryan  Date of Birth: May 25, 1940 Visit Type: Office Visit   Date: 12/10/2016 10:00 AM Provider: Marchia Meiers. [name] MD   This 77 year old male presents for back pain.   History of Present Illness: 1.  back pain  Patient returns to review his MRI  Of note, patient reports hematuria since his MRI.  He will call his primary physician later today.  He does not have a nephrologist. Patient presents with his cane in replace of wheelchair and notes feeling mildly better. He notes complete relief with sitting or laying down.   MRI 12/05/16: Recurrent central disc protrusion at L3-4 with moderate central canal stenosis despite laminectomy. Stable foraminal narrowing bilaterally at L3-4, right greater than left. Recurrent right paramedian disc protrusion at L4-5 with severe central canal stenosis. Progressive moderate foraminal stenosis bilaterally at L4-5. Stable facet hypertrophy at L2-3 and L5-S1 without significant stenosis at either level.           MEDICATIONS(added, continued or stopped this visit): Started Medication Directions Instruction Stopped  12/03/2015 acetaminophen 300 mg-codeine 60 mg tablet take 1 tablet by oral route  every 6 hours as needed as needed  12/10/2016   atenolol 50 mg tablet take 1 tablet by oral route  every day     Ecotrin 325 mg tablet,enteric coated take 2 tablet by oral route  every day     furosemide 40 mg tablet take 1 tablet by oral route  every day    12/03/2016 hydrocodone 5 mg-acetaminophen 325 mg tablet take 1 tablet by oral route  every 6 hours as needed for pain    12/03/2015 Keflex 500 mg capsule take 1 capsule by oral route  every 8 hours  12/10/2016   lisinopril 20 mg tablet take 1 tablet by oral route  every day    12/03/2016 Medrol (Pak) 4 mg tablets in a dose pack take by Oral route as directed     methocarbamol 500 mg tablet take 1 tablet by oral route 3 times every day     naproxen 500 mg tablet  take 1 tablet by oral route 2 times every day with food  12/10/2016   Osteo Bi-Flex Take 2 tablets daily     simvastatin 40 mg tablet take 1 tablet by oral route  every day in the evening     Vitamin B-12 1,000 mcg tablet Take 1 tablet daily     Vitamin B-6 100 mg tablet Take 1 tablet daily     Vitamin C 500 mg tablet Take 1 tablet daily       ALLERGIES: Ingredient Reaction Medication Name Comment  NO KNOWN ALLERGIES     No known allergies.    Vitals Date Temp F BP Pulse Ht In Wt Lb BMI BSA Pain Score  12/10/2016  185/68 63 73 289.4 38.18  8/10      IMPRESSION Patient returns to review MRI. MRI shows disc rupture at L3-4 and L4-5. Patient is interested in surgery. Schedule redo laminectomy and L3-4 L4-5 PLIF with pedicle screw fixation.     Pain Management Plan Pain Scale: 8/10. Method: Numeric Pain Intensity Scale. Location: hip and leg. Onset: 11/19/2016. Duration: varies. Quality: discomforting. Pain management follow-up plan of care: Patient taking medication as prescribed..  Schedule redo laminectomy and L3-4 L4-5 PLIF with pedicle screw fixation. Nurse education given. Fit for LSO brace.             Provider:  [name] MD,  Marchia Meiers 12/10/2016 10:35 AM  Dictation edited by: Lucita Lora    CC Providers: Claris Gower [place],  [place]  [id number]-   Wilson Elkins  [place], [place] [address]-              Electronically signed by Marchia Meiers [name] MD on 12/12/2016 03:54 PM  Patient ID:   [id number] Patient: Joseph Bryan  Date of Birth: 1940/09/21 Visit Type: Office Visit   Date: 12/03/2016 09:15 AM Provider: Marchia Meiers. [name] MD   This 77 year old male presents for back pain.   History of Present Illness: 1.  back pain  10/19/2015 L3-4 laminectomy  Patient returns reporting severe left lumbar/buttock/ leg pain x2 weeks with standing or walking. Pain radiates to behind the left knee. He recalls no  injury. He presents in a wheelchair as he is unable to walk.   Tylenol No.  4 taken 2-3 per day  X-rays on [place]  Physical: Incision site looks well healed. Full strength in lower extremities. Negative left seated SLR, and negative left Patrick's. Reflexes are symmetric.           MEDICATIONS(added, continued or stopped this visit): Started Medication Directions Instruction Stopped  12/03/2015 acetaminophen 300 mg-codeine 60 mg tablet take 1 tablet by oral route  every 6 hours as needed as needed     atenolol 50 mg tablet take 1 tablet by oral route  every day     Ecotrin 325 mg tablet,enteric coated take 2 tablet by oral route  every day     furosemide 40 mg tablet take 1 tablet by oral route  every day    12/03/2016 hydrocodone 5 mg-acetaminophen 325 mg tablet take 1 tablet by oral route  every 6 hours as needed for pain    12/03/2015 Keflex 500 mg capsule take 1 capsule by oral route  every 8 hours     lisinopril 20 mg tablet take 1 tablet by oral route  every day    12/03/2016 Medrol (Pak) 4 mg tablets in a dose pack take by Oral route as directed     methocarbamol 500 mg tablet take 1 tablet by oral route 3 times every day     naproxen 500 mg tablet take 1 tablet by oral route 2 times every day with food     Osteo Bi-Flex Take 2 tablets daily     simvastatin 40 mg tablet take 1 tablet by oral route  every day in the evening     Vitamin B-12 1,000 mcg tablet Take 1 tablet daily     Vitamin B-6 100 mg tablet Take 1 tablet daily     Vitamin C 500 mg tablet Take 1 tablet daily       ALLERGIES: Ingredient Reaction Medication Name Comment  NO KNOWN ALLERGIES     No known allergies. Reviewed, no changes.    Vitals Date Temp F BP Pulse Ht In Wt Lb BMI BSA Pain Score  12/03/2016  121/69 93 73 296.6 39.13  10/10      IMPRESSION Patient presents with lumbar and left leg pain radiating to the knee. Patient presents in a wheelchair. On confrontational testing, full  strength in lower extremities and reflexes are symmetric. X-ray shows retrolisthesis of L3 on L4 with mild foraminal stenosis, significant stenosis on extension. Prescribe steroid taper and hydrocodone. Schedule lumbar MRI.  Completed Orders (this encounter) Order Details Reason Side Interpretation Result Initial Treatment Date Region  Lumbar Spine- AP/Lat/Flex/Ex      12/03/2016 All Levels to All Levels   Assessment/Plan # Detail Type Description   1. 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           Pain Management Plan Pain Scale: 10/10. Method: Numeric Pain Intensity Scale. Location: back. Onset: 11/19/2016. Duration: varies. Quality: discomforting. Pain management follow-up plan of care: Patient is taking OTC pain relievers for relief..  Prescribe steroid taper and hydrocodone. Schedule lumbar MRI. Follow-up after MRI.  Orders: Diagnostic Procedures: Assessment Procedure  M54.16 Lumbar Spine- AP/Lat/Flex/Ex    MEDICATIONS PRESCRIBED TODAY    Rx Quantity Refills  MEDROL 4 mg  1 0  HYDROCODONE-ACETAMINOPHEN 5 mg-325 mg  60 0            Provider:  [name] MDMarchia Meiers 12/03/2016 10:24 AM  Dictation edited by: Lucita Lora    CC Providers: Claris Gower [place],  [place]  [id number]-   Wilson Elkins  [place], [place] [address]-              Electronically signed by Marchia Meiers [name] MD on 12/07/2016 01:28 PM

## 2017-01-15 NOTE — Patient Instructions (Addendum)
Medication Instructions:  Your provider recommends that you continue on your current medications as directed. Please refer to the Current Medication list given to you today.    Labwork: None  Testing/Procedures: None  Follow-Up: Your provider recommends that you schedule a follow-up appointment AS PLANNED with Dr. Claiborne Billings.  Any Other Special Instructions Will Be Listed Below (If Applicable). You have been cleared for surgery!    If you need a refill on your cardiac medications before your next appointment, please call your pharmacy.

## 2017-01-16 ENCOUNTER — Encounter (HOSPITAL_COMMUNITY)
Admission: RE | Disposition: A | Discharge status: Skilled Nursing Facility | Payer: Self-pay | Source: Ambulatory Visit | Attending: Neurosurgery

## 2017-01-16 ENCOUNTER — Inpatient Hospital Stay (HOSPITAL_COMMUNITY): Payer: Medicare Other | Admitting: Anesthesiology

## 2017-01-16 ENCOUNTER — Inpatient Hospital Stay (HOSPITAL_COMMUNITY): Payer: Medicare Other

## 2017-01-16 ENCOUNTER — Other Ambulatory Visit: Payer: Self-pay

## 2017-01-16 ENCOUNTER — Encounter (HOSPITAL_COMMUNITY): Payer: Self-pay

## 2017-01-16 ENCOUNTER — Inpatient Hospital Stay (HOSPITAL_COMMUNITY): Payer: Medicare Other | Admitting: Vascular Surgery

## 2017-01-16 ENCOUNTER — Inpatient Hospital Stay (HOSPITAL_COMMUNITY)
Admission: RE | Admit: 2017-01-16 | Discharge: 2017-01-22 | DRG: 455 | Disposition: A | Discharge status: Skilled Nursing Facility | Payer: Medicare Other | Source: Ambulatory Visit | Attending: Neurosurgery | Admitting: Neurosurgery

## 2017-01-16 DIAGNOSIS — I252 Old myocardial infarction: Secondary | ICD-10-CM | POA: Diagnosis not present

## 2017-01-16 DIAGNOSIS — I2510 Atherosclerotic heart disease of native coronary artery without angina pectoris: Secondary | ICD-10-CM | POA: Diagnosis present

## 2017-01-16 DIAGNOSIS — E785 Hyperlipidemia, unspecified: Secondary | ICD-10-CM | POA: Diagnosis present

## 2017-01-16 DIAGNOSIS — R319 Hematuria, unspecified: Secondary | ICD-10-CM | POA: Diagnosis present

## 2017-01-16 DIAGNOSIS — M5116 Intervertebral disc disorders with radiculopathy, lumbar region: Principal | ICD-10-CM | POA: Diagnosis present

## 2017-01-16 DIAGNOSIS — M549 Dorsalgia, unspecified: Secondary | ICD-10-CM | POA: Diagnosis present

## 2017-01-16 DIAGNOSIS — M48061 Spinal stenosis, lumbar region without neurogenic claudication: Secondary | ICD-10-CM | POA: Diagnosis present

## 2017-01-16 DIAGNOSIS — G4730 Sleep apnea, unspecified: Secondary | ICD-10-CM | POA: Diagnosis present

## 2017-01-16 DIAGNOSIS — I129 Hypertensive chronic kidney disease with stage 1 through stage 4 chronic kidney disease, or unspecified chronic kidney disease: Secondary | ICD-10-CM | POA: Diagnosis present

## 2017-01-16 DIAGNOSIS — M48062 Spinal stenosis, lumbar region with neurogenic claudication: Secondary | ICD-10-CM | POA: Diagnosis present

## 2017-01-16 DIAGNOSIS — N189 Chronic kidney disease, unspecified: Secondary | ICD-10-CM | POA: Diagnosis present

## 2017-01-16 DIAGNOSIS — Z419 Encounter for procedure for purposes other than remedying health state, unspecified: Secondary | ICD-10-CM

## 2017-01-16 SURGERY — POSTERIOR LUMBAR FUSION 2 LEVEL
Anesthesia: General | Site: Back

## 2017-01-16 MED ORDER — EPHEDRINE SULFATE 50 MG/ML IJ SOLN
INTRAMUSCULAR | Status: DC | PRN
  Administered 2017-01-16 (×5): 10 mg via INTRAVENOUS

## 2017-01-16 MED ORDER — BEE POLLEN 580 MG PO CAPS: 3.0000 | ORAL_CAPSULE | Freq: Every day | ORAL | Status: DC

## 2017-01-16 MED ORDER — SUCCINYLCHOLINE CHLORIDE 20 MG/ML IJ SOLN
INTRAMUSCULAR | Status: DC | PRN
  Administered 2017-01-16: 120 mg via INTRAVENOUS

## 2017-01-16 MED ORDER — METHOCARBAMOL 1000 MG/10ML IJ SOLN
500.0000 mg | Freq: Four times a day (QID) | INTRAMUSCULAR | Status: DC | PRN
  Filled 2017-01-16: qty 5

## 2017-01-16 MED ORDER — MENTHOL 3 MG MT LOZG: 1.0000 | LOZENGE | OROMUCOSAL | Status: DC | PRN

## 2017-01-16 MED ORDER — METHYLPREDNISOLONE 4 MG PO TABS: 4.0000 mg | ORAL_TABLET | Freq: Every day | ORAL | Status: DC

## 2017-01-16 MED ORDER — METHYLPREDNISOLONE ACETATE 80 MG/ML IJ SUSP
INTRAMUSCULAR | Status: AC
  Filled 2017-01-16: qty 1

## 2017-01-16 MED ORDER — MORPHINE SULFATE (PF) 2 MG/ML IV SOLN
2.0000 mg | INTRAVENOUS | Status: DC | PRN
  Administered 2017-01-16 – 2017-01-20 (×4): 2 mg via INTRAVENOUS
  Filled 2017-01-16 (×4): qty 1

## 2017-01-16 MED ORDER — SODIUM CHLORIDE 0.9% FLUSH
3.0000 mL | Freq: Two times a day (BID) | INTRAVENOUS | Status: DC
  Administered 2017-01-16 – 2017-01-19 (×6): 3 mL via INTRAVENOUS
  Administered 2017-01-19: 10 mL via INTRAVENOUS
  Administered 2017-01-20 – 2017-01-22 (×5): 3 mL via INTRAVENOUS

## 2017-01-16 MED ORDER — DOCUSATE SODIUM 100 MG PO CAPS
100.0000 mg | ORAL_CAPSULE | Freq: Two times a day (BID) | ORAL | Status: DC
  Administered 2017-01-16 – 2017-01-21 (×10): 100 mg via ORAL
  Filled 2017-01-16 (×12): qty 1

## 2017-01-16 MED ORDER — SUCCINYLCHOLINE CHLORIDE 200 MG/10ML IV SOSY
PREFILLED_SYRINGE | INTRAVENOUS | Status: AC
  Filled 2017-01-16: qty 10

## 2017-01-16 MED ORDER — LIDOCAINE-EPINEPHRINE 1 %-1:100000 IJ SOLN
INTRAMUSCULAR | Status: AC
  Filled 2017-01-16: qty 1

## 2017-01-16 MED ORDER — SODIUM CHLORIDE 0.9% FLUSH: 3.0000 mL | INTRAVENOUS | Status: DC | PRN

## 2017-01-16 MED ORDER — CHLORHEXIDINE GLUCONATE CLOTH 2 % EX PADS: 6.0000 | MEDICATED_PAD | Freq: Once | CUTANEOUS | Status: DC

## 2017-01-16 MED ORDER — ONDANSETRON HCL 4 MG/2ML IJ SOLN: 4.0000 mg | Freq: Four times a day (QID) | INTRAMUSCULAR | Status: DC | PRN

## 2017-01-16 MED ORDER — HYDROMORPHONE HCL 1 MG/ML IJ SOLN
INTRAMUSCULAR | Status: AC
  Administered 2017-01-16: 0.5 mg via INTRAVENOUS
  Filled 2017-01-16: qty 1

## 2017-01-16 MED ORDER — LACTATED RINGERS IV SOLN: INTRAVENOUS | Status: DC

## 2017-01-16 MED ORDER — BUPIVACAINE LIPOSOME 1.3 % IJ SUSP
20.0000 mL | INTRAMUSCULAR | Status: AC
  Administered 2017-01-16: 20 mL
  Filled 2017-01-16: qty 20

## 2017-01-16 MED ORDER — VITAMIN B-12 1000 MCG PO TABS
1000.0000 ug | ORAL_TABLET | Freq: Every day | ORAL | Status: DC
  Administered 2017-01-16 – 2017-01-22 (×7): 1000 ug via ORAL
  Filled 2017-01-16 (×7): qty 1

## 2017-01-16 MED ORDER — KCL IN DEXTROSE-NACL 20-5-0.45 MEQ/L-%-% IV SOLN
INTRAVENOUS | Status: DC
  Administered 2017-01-16: 20:00:00 via INTRAVENOUS
  Filled 2017-01-16: qty 1000

## 2017-01-16 MED ORDER — FENTANYL CITRATE (PF) 100 MCG/2ML IJ SOLN
INTRAMUSCULAR | Status: DC | PRN
  Administered 2017-01-16 (×2): 25 ug via INTRAVENOUS
  Administered 2017-01-16 (×3): 50 ug via INTRAVENOUS
  Administered 2017-01-16 (×2): 25 ug via INTRAVENOUS

## 2017-01-16 MED ORDER — ROCURONIUM BROMIDE 10 MG/ML (PF) SYRINGE
PREFILLED_SYRINGE | INTRAVENOUS | Status: AC
  Filled 2017-01-16: qty 5

## 2017-01-16 MED ORDER — VITAMIN B-6 100 MG PO TABS
100.0000 mg | ORAL_TABLET | Freq: Every day | ORAL | Status: DC
  Administered 2017-01-16 – 2017-01-22 (×7): 100 mg via ORAL
  Filled 2017-01-16 (×7): qty 1

## 2017-01-16 MED ORDER — SUGAMMADEX SODIUM 500 MG/5ML IV SOLN
INTRAVENOUS | Status: DC | PRN
  Administered 2017-01-16: 300 mg via INTRAVENOUS

## 2017-01-16 MED ORDER — SCOPOLAMINE 1 MG/3DAYS TD PT72
MEDICATED_PATCH | TRANSDERMAL | Status: AC
  Filled 2017-01-16: qty 1

## 2017-01-16 MED ORDER — ONDANSETRON HCL 4 MG/2ML IJ SOLN
INTRAMUSCULAR | Status: AC
  Filled 2017-01-16: qty 2

## 2017-01-16 MED ORDER — GLYCOPYRROLATE 0.2 MG/ML IJ SOLN
INTRAMUSCULAR | Status: DC | PRN
  Administered 2017-01-16: 0.2 mg via INTRAVENOUS

## 2017-01-16 MED ORDER — HEMOSTATIC AGENTS (NO CHARGE) OPTIME
TOPICAL | Status: DC | PRN
  Administered 2017-01-16: 1 via TOPICAL

## 2017-01-16 MED ORDER — DEXTROSE 5 % IV SOLN
INTRAVENOUS | Status: DC | PRN
  Administered 2017-01-16: 80 ug/min via INTRAVENOUS

## 2017-01-16 MED ORDER — BUPIVACAINE HCL (PF) 0.5 % IJ SOLN
INTRAMUSCULAR | Status: DC | PRN
  Administered 2017-01-16: 10 mL

## 2017-01-16 MED ORDER — VANCOMYCIN HCL 1000 MG IV SOLR
INTRAVENOUS | Status: DC | PRN
  Administered 2017-01-16: 1000 mg

## 2017-01-16 MED ORDER — THROMBIN (RECOMBINANT) 5000 UNITS EX SOLR
CUTANEOUS | Status: AC
Start: 2017-01-16 — End: ?
  Filled 2017-01-16: qty 5000

## 2017-01-16 MED ORDER — FLEET ENEMA 7-19 GM/118ML RE ENEM: 1.0000 | ENEMA | Freq: Once | RECTAL | Status: DC | PRN

## 2017-01-16 MED ORDER — EPHEDRINE 5 MG/ML INJ
INTRAVENOUS | Status: AC
  Filled 2017-01-16: qty 10

## 2017-01-16 MED ORDER — PROPOFOL 10 MG/ML IV BOLUS
INTRAVENOUS | Status: DC | PRN
  Administered 2017-01-16: 110 mg via INTRAVENOUS

## 2017-01-16 MED ORDER — FUROSEMIDE 40 MG PO TABS
40.0000 mg | ORAL_TABLET | Freq: Every day | ORAL | Status: DC
  Administered 2017-01-18 – 2017-01-22 (×5): 40 mg via ORAL
  Filled 2017-01-16 (×6): qty 1

## 2017-01-16 MED ORDER — LIDOCAINE 2% (20 MG/ML) 5 ML SYRINGE
INTRAMUSCULAR | Status: AC
  Filled 2017-01-16: qty 10

## 2017-01-16 MED ORDER — ACETAMINOPHEN 325 MG PO TABS
650.0000 mg | ORAL_TABLET | ORAL | Status: DC | PRN
  Administered 2017-01-21: 650 mg via ORAL
  Filled 2017-01-16: qty 2

## 2017-01-16 MED ORDER — MIDAZOLAM HCL 2 MG/2ML IJ SOLN
INTRAMUSCULAR | Status: AC
  Filled 2017-01-16: qty 2

## 2017-01-16 MED ORDER — PROMETHAZINE HCL 25 MG/ML IJ SOLN: 6.2500 mg | INTRAMUSCULAR | Status: DC | PRN

## 2017-01-16 MED ORDER — SODIUM CHLORIDE 0.9 % IV SOLN: 250.0000 mL | INTRAVENOUS | Status: DC

## 2017-01-16 MED ORDER — ATENOLOL 25 MG PO TABS
50.0000 mg | ORAL_TABLET | Freq: Every day | ORAL | Status: DC
  Administered 2017-01-19 – 2017-01-22 (×4): 50 mg via ORAL
  Filled 2017-01-16 (×6): qty 2

## 2017-01-16 MED ORDER — BUPIVACAINE HCL (PF) 0.5 % IJ SOLN
INTRAMUSCULAR | Status: AC
  Filled 2017-01-16: qty 30

## 2017-01-16 MED ORDER — PHENOL 1.4 % MT LIQD: 1.0000 | OROMUCOSAL | Status: DC | PRN

## 2017-01-16 MED ORDER — OXYCODONE HCL 5 MG PO TABS
5.0000 mg | ORAL_TABLET | ORAL | Status: DC | PRN
  Administered 2017-01-20 – 2017-01-22 (×3): 5 mg via ORAL
  Filled 2017-01-16 (×3): qty 1

## 2017-01-16 MED ORDER — ACETAMINOPHEN 650 MG RE SUPP: 650.0000 mg | RECTAL | Status: DC | PRN

## 2017-01-16 MED ORDER — ONDANSETRON HCL 4 MG PO TABS: 4.0000 mg | ORAL_TABLET | Freq: Four times a day (QID) | ORAL | Status: DC | PRN

## 2017-01-16 MED ORDER — ROSUVASTATIN CALCIUM 5 MG PO TABS
10.0000 mg | ORAL_TABLET | Freq: Every day | ORAL | Status: DC
  Administered 2017-01-16 – 2017-01-21 (×6): 10 mg via ORAL
  Filled 2017-01-16 (×6): qty 2

## 2017-01-16 MED ORDER — CEFAZOLIN SODIUM-DEXTROSE 2-4 GM/100ML-% IV SOLN
2.0000 g | Freq: Three times a day (TID) | INTRAVENOUS | Status: AC
  Administered 2017-01-16 – 2017-01-17 (×2): 2 g via INTRAVENOUS
  Filled 2017-01-16 (×3): qty 100

## 2017-01-16 MED ORDER — LIDOCAINE HCL (CARDIAC) 20 MG/ML IV SOLN
INTRAVENOUS | Status: DC | PRN
  Administered 2017-01-16 (×2): 50 mg via INTRAVENOUS
  Administered 2017-01-16: 100 mg via INTRAVENOUS

## 2017-01-16 MED ORDER — HYDROMORPHONE HCL 1 MG/ML IJ SOLN
0.2500 mg | INTRAMUSCULAR | Status: DC | PRN
  Administered 2017-01-16 (×4): 0.5 mg via INTRAVENOUS

## 2017-01-16 MED ORDER — LIDOCAINE-EPINEPHRINE 1 %-1:100000 IJ SOLN
INTRAMUSCULAR | Status: DC | PRN
  Administered 2017-01-16: 10 mL

## 2017-01-16 MED ORDER — BISACODYL 10 MG RE SUPP: 10.0000 mg | Freq: Every day | RECTAL | Status: DC | PRN

## 2017-01-16 MED ORDER — VANCOMYCIN HCL 1000 MG IV SOLR
INTRAVENOUS | Status: AC
  Filled 2017-01-16: qty 1000

## 2017-01-16 MED ORDER — ONDANSETRON HCL 4 MG/2ML IJ SOLN
INTRAMUSCULAR | Status: DC | PRN
  Administered 2017-01-16: 4 mg via INTRAVENOUS

## 2017-01-16 MED ORDER — LATANOPROST 0.005 % OP SOLN
1.0000 [drp] | Freq: Every day | OPHTHALMIC | Status: DC
  Administered 2017-01-16 – 2017-01-21 (×6): 1 [drp] via OPHTHALMIC
  Filled 2017-01-16: qty 2.5

## 2017-01-16 MED ORDER — ALUM & MAG HYDROXIDE-SIMETH 200-200-20 MG/5ML PO SUSP: 30.0000 mL | Freq: Four times a day (QID) | ORAL | Status: DC | PRN

## 2017-01-16 MED ORDER — SODIUM CHLORIDE 0.9 % IV SOLN
INTRAVENOUS | Status: DC | PRN
  Administered 2017-01-16: 14:00:00 via INTRAVENOUS

## 2017-01-16 MED ORDER — SENNOSIDES-DOCUSATE SODIUM 8.6-50 MG PO TABS
1.0000 | ORAL_TABLET | Freq: Every evening | ORAL | Status: DC | PRN
  Administered 2017-01-18: 1 via ORAL
  Filled 2017-01-16: qty 1

## 2017-01-16 MED ORDER — DEXAMETHASONE SODIUM PHOSPHATE 10 MG/ML IJ SOLN
INTRAMUSCULAR | Status: AC
  Filled 2017-01-16: qty 1

## 2017-01-16 MED ORDER — 0.9 % SODIUM CHLORIDE (POUR BTL) OPTIME
TOPICAL | Status: DC | PRN
  Administered 2017-01-16: 1000 mL

## 2017-01-16 MED ORDER — ROCURONIUM BROMIDE 100 MG/10ML IV SOLN
INTRAVENOUS | Status: DC | PRN
  Administered 2017-01-16: 40 mg via INTRAVENOUS
  Administered 2017-01-16: 10 mg via INTRAVENOUS

## 2017-01-16 MED ORDER — DEXAMETHASONE SODIUM PHOSPHATE 10 MG/ML IJ SOLN
INTRAMUSCULAR | Status: DC | PRN
  Administered 2017-01-16: 10 mg via INTRAVENOUS

## 2017-01-16 MED ORDER — LIDOCAINE 2% (20 MG/ML) 5 ML SYRINGE
INTRAMUSCULAR | Status: AC
  Filled 2017-01-16: qty 5

## 2017-01-16 MED ORDER — LISINOPRIL 20 MG PO TABS
30.0000 mg | ORAL_TABLET | Freq: Every day | ORAL | Status: DC
  Administered 2017-01-19 – 2017-01-21 (×3): 30 mg via ORAL
  Filled 2017-01-16 (×6): qty 1

## 2017-01-16 MED ORDER — ZOLPIDEM TARTRATE 5 MG PO TABS
5.0000 mg | ORAL_TABLET | Freq: Every evening | ORAL | Status: DC | PRN
  Filled 2017-01-16: qty 1

## 2017-01-16 MED ORDER — THROMBIN (RECOMBINANT) 5000 UNITS EX SOLR
CUTANEOUS | Status: DC | PRN
  Administered 2017-01-16: 5000 [IU] via TOPICAL

## 2017-01-16 MED ORDER — PANTOPRAZOLE SODIUM 40 MG IV SOLR
40.0000 mg | Freq: Every day | INTRAVENOUS | Status: DC
  Administered 2017-01-16: 40 mg via INTRAVENOUS
  Filled 2017-01-16: qty 40

## 2017-01-16 MED ORDER — HYDROCODONE-ACETAMINOPHEN 5-325 MG PO TABS
1.0000 | ORAL_TABLET | Freq: Four times a day (QID) | ORAL | Status: DC | PRN
Start: 2017-01-16 — End: 2017-01-16

## 2017-01-16 MED ORDER — PHENYLEPHRINE HCL 10 MG/ML IJ SOLN
INTRAMUSCULAR | Status: DC | PRN
  Administered 2017-01-16 (×2): 80 ug via INTRAVENOUS

## 2017-01-16 MED ORDER — PHENYLEPHRINE 40 MCG/ML (10ML) SYRINGE FOR IV PUSH (FOR BLOOD PRESSURE SUPPORT)
PREFILLED_SYRINGE | INTRAVENOUS | Status: AC
  Filled 2017-01-16: qty 10

## 2017-01-16 MED ORDER — OXYCODONE HCL 5 MG PO TABS
10.0000 mg | ORAL_TABLET | ORAL | Status: DC | PRN
  Administered 2017-01-16 – 2017-01-22 (×17): 10 mg via ORAL
  Filled 2017-01-16 (×17): qty 2

## 2017-01-16 MED ORDER — PROPOFOL 10 MG/ML IV BOLUS
INTRAVENOUS | Status: AC
  Filled 2017-01-16: qty 20

## 2017-01-16 MED ORDER — LACTATED RINGERS IV SOLN
INTRAVENOUS | Status: DC
  Administered 2017-01-16 – 2017-01-22 (×3): via INTRAVENOUS

## 2017-01-16 MED ORDER — FENTANYL CITRATE (PF) 250 MCG/5ML IJ SOLN
INTRAMUSCULAR | Status: AC
  Filled 2017-01-16: qty 5

## 2017-01-16 MED ORDER — MEPERIDINE HCL 25 MG/ML IJ SOLN: 6.2500 mg | INTRAMUSCULAR | Status: DC | PRN

## 2017-01-16 MED ORDER — METHOCARBAMOL 500 MG PO TABS
500.0000 mg | ORAL_TABLET | Freq: Four times a day (QID) | ORAL | Status: DC | PRN
  Administered 2017-01-16 – 2017-01-22 (×7): 500 mg via ORAL
  Filled 2017-01-16 (×7): qty 1

## 2017-01-16 MED FILL — Heparin Sodium (Porcine) Inj 1000 Unit/ML: INTRAMUSCULAR | Qty: 30 | Status: AC

## 2017-01-16 MED FILL — Sodium Chloride IV Soln 0.9%: INTRAVENOUS | Qty: 2000 | Status: AC

## 2017-01-16 SURGICAL SUPPLY — 77 items
BASKET BONE COLLECTION (BASKET) ×2 IMPLANT
BLADE CLIPPER SURG (BLADE) IMPLANT
BONE CANC CHIPS 20CC PCAN1/4 (Bone Implant) ×2 IMPLANT
BONE CANC CHIPS 40CC CAN1/2 (Bone Implant) ×2 IMPLANT
BUR MATCHSTICK NEURO 3.0 LAGG (BURR) ×2 IMPLANT
BUR PRECISION FLUTE 5.0 (BURR) ×2 IMPLANT
CAGE COROENT LG 10X9X23-12 (Cage) ×4 IMPLANT
CAGE MAS PLIF 9X9X23-8 LUMBAR (Cage) ×4 IMPLANT
CANISTER SUCT 3000ML PPV (MISCELLANEOUS) ×2 IMPLANT
CARTRIDGE OIL MAESTRO DRILL (MISCELLANEOUS) ×1 IMPLANT
CHIPS CANC BONE 20CC PCAN1/4 (Bone Implant) ×1 IMPLANT
CHIPS CANC BONE 40CC CAN1/2 (Bone Implant) ×1 IMPLANT
CONT SPEC 4OZ CLIKSEAL STRL BL (MISCELLANEOUS) ×2 IMPLANT
COVER BACK TABLE 60X90IN (DRAPES) ×2 IMPLANT
DECANTER SPIKE VIAL GLASS SM (MISCELLANEOUS) ×2 IMPLANT
DERMABOND ADVANCED (GAUZE/BANDAGES/DRESSINGS) ×1
DERMABOND ADVANCED .7 DNX12 (GAUZE/BANDAGES/DRESSINGS) ×1 IMPLANT
DIFFUSER DRILL AIR PNEUMATIC (MISCELLANEOUS) ×2 IMPLANT
DRAPE C-ARM 42X72 X-RAY (DRAPES) ×2 IMPLANT
DRAPE C-ARMOR (DRAPES) ×2 IMPLANT
DRAPE LAPAROTOMY 100X72X124 (DRAPES) ×2 IMPLANT
DRAPE MICROSCOPE LEICA (MISCELLANEOUS) ×2 IMPLANT
DRAPE POUCH INSTRU U-SHP 10X18 (DRAPES) ×2 IMPLANT
DRAPE SURG 17X23 STRL (DRAPES) ×2 IMPLANT
DRSG OPSITE POSTOP 4X8 (GAUZE/BANDAGES/DRESSINGS) ×2 IMPLANT
DURAPREP 26ML APPLICATOR (WOUND CARE) ×2 IMPLANT
ELECT REM PT RETURN 9FT ADLT (ELECTROSURGICAL) ×2
ELECTRODE REM PT RTRN 9FT ADLT (ELECTROSURGICAL) ×1 IMPLANT
EVACUATOR 1/8 PVC DRAIN (DRAIN) ×2 IMPLANT
GAUZE SPONGE 4X4 12PLY STRL (GAUZE/BANDAGES/DRESSINGS) ×2 IMPLANT
GAUZE SPONGE 4X4 16PLY XRAY LF (GAUZE/BANDAGES/DRESSINGS) ×4 IMPLANT
GLOVE BIO SURGEON STRL SZ8 (GLOVE) ×4 IMPLANT
GLOVE BIOGEL PI IND STRL 8 (GLOVE) ×2 IMPLANT
GLOVE BIOGEL PI IND STRL 8.5 (GLOVE) ×2 IMPLANT
GLOVE BIOGEL PI INDICATOR 8 (GLOVE) ×2
GLOVE BIOGEL PI INDICATOR 8.5 (GLOVE) ×2
GLOVE ECLIPSE 8.0 STRL XLNG CF (GLOVE) ×4 IMPLANT
GLOVE EXAM NITRILE LRG STRL (GLOVE) IMPLANT
GLOVE EXAM NITRILE XL STR (GLOVE) IMPLANT
GLOVE EXAM NITRILE XS STR PU (GLOVE) IMPLANT
GOWN STRL REUS W/ TWL LRG LVL3 (GOWN DISPOSABLE) IMPLANT
GOWN STRL REUS W/ TWL XL LVL3 (GOWN DISPOSABLE) ×2 IMPLANT
GOWN STRL REUS W/TWL 2XL LVL3 (GOWN DISPOSABLE) ×6 IMPLANT
GOWN STRL REUS W/TWL LRG LVL3 (GOWN DISPOSABLE)
GOWN STRL REUS W/TWL XL LVL3 (GOWN DISPOSABLE) ×2
HEMOSTAT POWDER SURGIFOAM 1G (HEMOSTASIS) ×2 IMPLANT
KIT BASIN OR (CUSTOM PROCEDURE TRAY) ×2 IMPLANT
KIT INFUSE SMALL (Orthopedic Implant) ×2 IMPLANT
KIT POSITION SURG JACKSON T1 (MISCELLANEOUS) ×2 IMPLANT
KIT ROOM TURNOVER OR (KITS) ×2 IMPLANT
MILL MEDIUM DISP (BLADE) ×4 IMPLANT
NEEDLE HYPO 25X1 1.5 SAFETY (NEEDLE) ×2 IMPLANT
NEEDLE SPNL 18GX3.5 QUINCKE PK (NEEDLE) ×2 IMPLANT
NS IRRIG 1000ML POUR BTL (IV SOLUTION) ×2 IMPLANT
OIL CARTRIDGE MAESTRO DRILL (MISCELLANEOUS) ×2
PACK LAMINECTOMY NEURO (CUSTOM PROCEDURE TRAY) ×2 IMPLANT
PAD ARMBOARD 7.5X6 YLW CONV (MISCELLANEOUS) ×6 IMPLANT
PATTIES SURGICAL .5 X.5 (GAUZE/BANDAGES/DRESSINGS) IMPLANT
PATTIES SURGICAL .5 X1 (DISPOSABLE) IMPLANT
PATTIES SURGICAL 1X1 (DISPOSABLE) IMPLANT
ROD RELINE LORD 5.5X80MM (Rod) ×2 IMPLANT
ROD RELINE-O 5.5X75 LORD (Rod) ×2 IMPLANT
RUBBERBAND STERILE (MISCELLANEOUS) ×4 IMPLANT
SCREW LOCK RELINE 5.5 TULIP (Screw) ×12 IMPLANT
SCREW RELINE-O POLY 6.5X50MM (Screw) ×12 IMPLANT
SPONGE LAP 4X18 X RAY DECT (DISPOSABLE) IMPLANT
SPONGE SURGIFOAM ABS GEL 100 (HEMOSTASIS) IMPLANT
STAPLER SKIN PROX WIDE 3.9 (STAPLE) ×2 IMPLANT
SUT VIC AB 1 CT1 18XBRD ANBCTR (SUTURE) ×2 IMPLANT
SUT VIC AB 1 CT1 8-18 (SUTURE) ×2
SUT VIC AB 2-0 CT1 18 (SUTURE) ×4 IMPLANT
SUT VIC AB 3-0 SH 8-18 (SUTURE) ×4 IMPLANT
SYR 5ML LL (SYRINGE) IMPLANT
TOWEL GREEN STERILE (TOWEL DISPOSABLE) ×2 IMPLANT
TOWEL GREEN STERILE FF (TOWEL DISPOSABLE) ×2 IMPLANT
TRAY FOLEY W/METER SILVER 16FR (SET/KITS/TRAYS/PACK) ×2 IMPLANT
WATER STERILE IRR 1000ML POUR (IV SOLUTION) ×2 IMPLANT

## 2017-01-16 NOTE — Brief Op Note (Signed)
01/16/2017  3:00 PM  PATIENT:  Joseph Bryan  77 y.o. male  PRE-OPERATIVE DIAGNOSIS:  Spinal stenosis of lumbar region with neurogenic claudication, herniated lumbar disc, radiculopathy, lumbago L 34 and L 45 levels  POST-OPERATIVE DIAGNOSIS:  Spinal stenosis of lumbar region with neurogenic claudication, herniated lumbar disc, radiculopathy, lumbago L 34 and L 45 levels  PROCEDURE:  Procedure(s): Redo decompression/laminectomy Lumbar three-four , Lumbar four-five, with posterior lumbar interbody fusion (N/A) with PEEK PLIF cages, autograft, pedicle screw fixation L 3 - L 5 levels with posterolateral arthrodesis L 3 - L 5 levels  Decompression greater than standard PLIF procedure with microdiscectomy with microdissection  SURGEON:  Surgeon(s) and Role:    Erline Levine, MD - Primary    * Ashok Pall, MD - Assisting  PHYSICIAN ASSISTANT:   ASSISTANTS: Poteat, RN   ANESTHESIA:   general  EBL:  400 mL   BLOOD ADMINISTERED:125 CC CELLSAVER  DRAINS: (Medium) Hemovact drain(s) in the paravertebral space with  Suction Open   LOCAL MEDICATIONS USED:  MARCAINE    and LIDOCAINE   SPECIMEN:  No Specimen  DISPOSITION OF SPECIMEN:  N/A  COUNTS:  YES  TOURNIQUET:  * No tourniquets in log *  DICTATION: Patient is 78 year old man with prior decompression at L 34 and at L4/5 with severe recurrent lumbar stenosis and a large disc herniation L 34 left with severe nerve root compression and spinal stenosis from the large amount of herniated disc material. It was elected to take him to surgery for redo decompression and discectomy and fusion at the L 34 and  L 45 levels.   Procedure: Patient was placed in a prone position on the [place] table after smooth and uncomplicated induction of general endotracheal anesthesia. His low back was prepped and draped in usual sterile fashion with betadine scrub and DuraPrep. Area of incision was infiltrated with local lidocaine. Incision was made to the  lumbodorsal fascia was incised and exposure was performed of the L 3 - L 5 transverse processes and facet joints.Confirmation of level was performed with intraoperative X ray. Redo laminectomy with disarticulation of facet joints at L 34 and L 45 levels.  Under microdissection, the thecal sac was cleared of scar and a large amount of herniated disc material was removed from at the L 34 level. After thorough decompression of all neural elements including thecal sac and bilateral L3, L 4,  L5 nerve roots, both discs appeared very degenerated and thorough bilateral disctomy was performed at L 34 and L 45 levels.  At L 34, 9 x 9 x 23 x 8 degree cages were placed with 10 cc autograft placed medial to the second cage.  At L 45 10 x 9 x23 x 12 degree cages were placed with a similar amount of bone graft medial to the second cage.  Pedicle screws were placed at L 3, L 4, L 5 levels (6.5 x 50 mm at each level).  Intraoperative fluoroscopy confirmed correct orientationin the AP and lateral plane.  Final x-rays demonstrated well-positioned pedicle screw fixation.  32mm lordotic rods were placed bilaterally and locked down in situ.  Posterolateral region was decorticated and packed with small BMP, local autograft, allograft (30 ccon each side from L 3 - L 5 levels). A medium Hemovac drain was placed. Long-acting Marcaine was injected in the soft tissues.  Fascia was closed with 1 Vicryl sutures skin edges were reapproximated 2 and 3-0 Vicryl sutures. The wound is dressed with Dermabond and  occlusive dressing.  The patient was extubated in the operating room and taken to recovery in stable satisfactory condition. Counts were correct at the end of the case.   PLAN OF CARE: Admit to inpatient   PATIENT DISPOSITION:  PACU - hemodynamically stable.   Delay start of Pharmacological VTE agent (>24hrs) due to surgical blood loss or risk of bleeding: yes

## 2017-01-16 NOTE — Anesthesia Procedure Notes (Signed)
Procedure Name: Intubation Date/Time: 01/16/2017 10:30 AM Performed by: Scheryl Darter, CRNA Pre-anesthesia Checklist: Patient identified, Emergency Drugs available, Suction available and Patient being monitored Patient Re-evaluated:Patient Re-evaluated prior to induction Oxygen Delivery Method: Circle System Utilized Preoxygenation: Pre-oxygenation with 100% oxygen Induction Type: IV induction Ventilation: Mask ventilation without difficulty Laryngoscope Size: Mac and 4 Grade View: Grade I Tube type: Oral Tube size: 7.5 mm Number of attempts: 1 Airway Equipment and Method: Stylet and Oral airway Placement Confirmation: ETT inserted through vocal cords under direct vision,  positive ETCO2 and breath sounds checked- equal and bilateral Secured at: 22 cm Tube secured with: Tape Dental Injury: Teeth and Oropharynx as per pre-operative assessment

## 2017-01-16 NOTE — Op Note (Signed)
01/16/2017  3:00 PM  PATIENT:  Joseph Bryan  77 y.o. male  PRE-OPERATIVE DIAGNOSIS:  Spinal stenosis of lumbar region with neurogenic claudication, herniated lumbar disc, radiculopathy, lumbago L 34 and L 45 levels  POST-OPERATIVE DIAGNOSIS:  Spinal stenosis of lumbar region with neurogenic claudication, herniated lumbar disc, radiculopathy, lumbago L 34 and L 45 levels  PROCEDURE:  Procedure(s): Redo decompression/laminectomy Lumbar three-four , Lumbar four-five, with posterior lumbar interbody fusion (N/A) with PEEK PLIF cages, autograft, pedicle screw fixation L 3 - L 5 levels with posterolateral arthrodesis L 3 - L 5 levels  Decompression greater than standard PLIF procedure with microdiscectomy with microdissection  SURGEON:  Surgeon(s) and Role:    Erline Levine, MD - Primary    * Ashok Pall, MD - Assisting  PHYSICIAN ASSISTANT:   ASSISTANTS: Poteat, RN   ANESTHESIA:   general  EBL:  400 mL   BLOOD ADMINISTERED:125 CC CELLSAVER  DRAINS: (Medium) Hemovact drain(s) in the paravertebral space with  Suction Open   LOCAL MEDICATIONS USED:  MARCAINE    and LIDOCAINE   SPECIMEN:  No Specimen  DISPOSITION OF SPECIMEN:  N/A  COUNTS:  YES  TOURNIQUET:  * No tourniquets in log *  DICTATION: Patient is 77 year old man with prior decompression at L 34 and at L4/5 with severe recurrent lumbar stenosis and a large disc herniation L 34 left with severe nerve root compression and spinal stenosis from the large amount of herniated disc material. It was elected to take him to surgery for redo decompression and discectomy and fusion at the L 34 and  L 45 levels.   Procedure: Patient was placed in a prone position on the [place] table after smooth and uncomplicated induction of general endotracheal anesthesia. His low back was prepped and draped in usual sterile fashion with betadine scrub and DuraPrep. Area of incision was infiltrated with local lidocaine. Incision was made to the  lumbodorsal fascia was incised and exposure was performed of the L 3 - L 5 transverse processes and facet joints.Confirmation of level was performed with intraoperative X ray. Redo laminectomy with disarticulation of facet joints at L 34 and L 45 levels.  Under microdissection, the thecal sac was cleared of scar and a large amount of herniated disc material was removed from at the L 34 level. After thorough decompression of all neural elements including thecal sac and bilateral L3, L 4,  L5 nerve roots, both discs appeared very degenerated and thorough bilateral disctomy was performed at L 34 and L 45 levels.  At L 34, 9 x 9 x 23 x 8 degree cages were placed with 10 cc autograft placed medial to the second cage.  At L 45 10 x 9 x23 x 12 degree cages were placed with a similar amount of bone graft medial to the second cage.  Pedicle screws were placed at L 3, L 4, L 5 levels (6.5 x 50 mm at each level).  Intraoperative fluoroscopy confirmed correct orientationin the AP and lateral plane.  Final x-rays demonstrated well-positioned pedicle screw fixation.  90mm lordotic rods were placed bilaterally and locked down in situ.  Posterolateral region was decorticated and packed with small BMP, local autograft, allograft (30 ccon each side from L 3 - L 5 levels). A medium Hemovac drain was placed. Long-acting Marcaine was injected in the soft tissues.  Fascia was closed with 1 Vicryl sutures skin edges were reapproximated 2 and 3-0 Vicryl sutures. The wound is dressed with Dermabond and  occlusive dressing.  The patient was extubated in the operating room and taken to recovery in stable satisfactory condition. Counts were correct at the end of the case.   PLAN OF CARE: Admit to inpatient   PATIENT DISPOSITION:  PACU - hemodynamically stable.   Delay start of Pharmacological VTE agent (>24hrs) due to surgical blood loss or risk of bleeding: yes

## 2017-01-16 NOTE — Progress Notes (Signed)
Awake, alert, sleepy, but arousable.  MAEW with good strength. Still some right leg weakness in DF, but patient feels this is much better than preop.

## 2017-01-16 NOTE — Anesthesia Preprocedure Evaluation (Addendum)
Anesthesia Evaluation  Patient identified by MRN, date of birth, ID band Patient awake    Reviewed: Allergy & Precautions, NPO status , Patient's Chart, lab work & pertinent test results  Airway Mallampati: II  TM Distance: >3 FB Neck ROM: Full    Dental  (+) Dental Advisory Given, Edentulous Upper, Edentulous Lower   Pulmonary sleep apnea , former smoker,    breath sounds clear to auscultation       Cardiovascular hypertension, Pt. on medications + CAD and + Past MI  + dysrhythmias  Rhythm:Regular Rate:Normal     Neuro/Psych    GI/Hepatic negative GI ROS, Neg liver ROS,   Endo/Other    Renal/GU CRFRenal disease     Musculoskeletal   Abdominal (+) + obese,   Peds  Hematology   Anesthesia Other Findings - HLD  Reproductive/Obstetrics                            Lab Results  Component Value Date   WBC 10.0 01/12/2017   HGB 13.5 01/12/2017   HCT 42.9 01/12/2017   MCV 96.6 01/12/2017   PLT 163 01/12/2017   Lab Results  Component Value Date   CREATININE 0.89 01/12/2017   BUN 12 01/12/2017   NA 135 01/12/2017   K 4.3 01/12/2017   CL 101 01/12/2017   CO2 26 01/12/2017   No results found for: INR, PROTIME  Echo: - Left ventricle: The cavity size was mildly dilated. There was   mild concentric hypertrophy. Systolic function was mildly   reduced. 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 Diffuse hypokinesis. Doppler parameters are consistent   with abnormal left ventricular relaxation (grade 1 diastolic   dysfunction). Doppler parameters are consistent with   indeterminate ventricular filling pressure. - Aortic valve: Transvalvular velocity was within the normal range.   There was no stenosis. There was mild regurgitation. - Mitral valve: Transvalvular velocity was within the normal range.   There was no evidence for stenosis. There was trivial    regurgitation. - Left atrium: The atrium was mildly to moderately dilated. - Right ventricle: The cavity size was normal. Wall thickness was   normal. Systolic function was normal. - Tricuspid valve: There was moderate regurgitation. - Pulmonary arteries: Systolic pressure was within the normal  Anesthesia Physical Anesthesia Plan  ASA: III  Anesthesia Plan: General   Post-op Pain Management:    Induction: Intravenous  PONV Risk Score and Plan: 3 and Ondansetron, Dexamethasone and Treatment may vary due to age or medical condition  Airway Management Planned: Oral ETT  Additional Equipment: None  Intra-op Plan:   Post-operative Plan: Extubation in OR  Informed Consent: I have reviewed the patients History and Physical, chart, labs and discussed the procedure including the risks, benefits and alternatives for the proposed anesthesia with the patient or authorized representative who has indicated his/her understanding and acceptance.   Dental advisory given  Plan Discussed with: CRNA  Anesthesia Plan Comments:         Anesthesia Quick Evaluation

## 2017-01-16 NOTE — Interval H&P Note (Signed)
History and Physical Interval Note:  01/16/2017 9:19 AM  Joseph Bryan  has presented today for surgery, with the diagnosis of Spinal stenosis of lumbar region with neurogenic claudication  The various methods of treatment have been discussed with the patient and family. After consideration of risks, benefits and other options for treatment, the patient has consented to  Procedure(s) with comments: Redo decompression/laminectomy L3-4, L4-5, with posterior lumbar interbody fusion (N/A) - Redo decompression/laminectomy L3-4, L4-5, with posterior lumbar interbody fusion as a surgical intervention .  The patient's history has been reviewed, patient examined, no change in status, stable for surgery.  I have reviewed the patient's chart and labs.  Questions were answered to the patient's satisfaction.     [name] D

## 2017-01-16 NOTE — Progress Notes (Signed)
PHARMACIST - PHYSICIAN ORDER COMMUNICATION  CONCERNING: P&T Medication Policy on Herbal Medications  DESCRIPTION:  This patient's order for:  Bee Pollen Capsules  has been noted.  This product(s) is classified as an "herbal" or natural product. Due to a lack of definitive safety studies or FDA approval, nonstandard manufacturing practices, plus the potential risk of unknown drug-drug interactions while on inpatient medications, the Pharmacy and Therapeutics Committee does not permit the use of "herbal" or natural products of this type within [facility].   ACTION TAKEN: The pharmacy department is unable to verify this order at this time and your patient has been informed of this safety policy. Please reevaluate patient's clinical condition at discharge and address if the herbal or natural product(s) should be resumed at that time.   Alycia Rossetti, PharmD, BCPS  7:40 PM

## 2017-01-16 NOTE — Transfer of Care (Signed)
Immediate Anesthesia Transfer of Care Note  Patient: Joseph Bryan  Procedure(s) Performed: Redo decompression/laminectomy Lumbar three-four , Lumbar four-five, with posterior lumbar interbody fusion (N/A Back)  Patient Location: PACU  Anesthesia Type:General  Level of Consciousness: awake, alert  and oriented  Airway & Oxygen Therapy: Patient Spontanous Breathing and Patient connected to face mask oxygen  Post-op Assessment: Report given to RN and Post -op Vital signs reviewed and stable  Post vital signs: Reviewed and stable  Last Vitals:  Vitals:   01/16/17 0844 01/16/17 1500  BP: 138/69 123/69  Pulse: 66 71  Resp: 18 17  Temp: 36.8 C   SpO2: 98% 100%    Last Pain:  Vitals:   01/16/17 0856  TempSrc:   PainSc: 4          Complications: No apparent anesthesia complications

## 2017-01-17 MED ORDER — PANTOPRAZOLE SODIUM 40 MG PO TBEC
40.0000 mg | DELAYED_RELEASE_TABLET | Freq: Every day | ORAL | Status: DC
  Administered 2017-01-17 – 2017-01-21 (×5): 40 mg via ORAL
  Filled 2017-01-17 (×5): qty 1

## 2017-01-17 NOTE — Evaluation (Signed)
Occupational Therapy Evaluation Patient Details Name: Joseph Bryan MRN: [id number] DOB: 1940/03/31 Today's Date: 01/17/2017    History of Present Illness s/p Redo decompression/laminectomy Lumbar three-four , Lumbar four-five, with posterior lumbar interbody fusion. PMH includes   Clinical Impression   Pt admitted with the above diagnoses and presents with below problem list. Pt will benefit from continued acute OT to address the below listed deficits and maximize independence with basic ADLs prior to d/c home with family assisting. PTA pt was mod I with ADLs. Pt is currently mod A for UB/LB ADLs, min-mod A for functional transfers and mobility to/from bathroom.       Follow Up Recommendations  Home health OT;Supervision/Assistance - 24 hour    Equipment Recommendations  3 in 1 bedside commode    Recommendations for Other Services PT consult     Precautions / Restrictions Precautions Precautions: Back Precaution Booklet Issued: No Precaution Comments: educated on back precautions Required Braces or Orthoses: Spinal Brace Spinal Brace: Lumbar corset;Applied in sitting position Restrictions Weight Bearing Restrictions: No      Mobility Bed Mobility Overal bed mobility: Needs Assistance Bed Mobility: Rolling;Sidelying to Sit Rolling: Mod assist Sidelying to sit: Mod assist       General bed mobility comments: mod A to maintain precautions and power up trunk to EOB position  Transfers Overall transfer level: Needs assistance Equipment used: Rolling walker (2 wheeled) Transfers: Sit to/from Stand Sit to Stand: Mod assist         General transfer comment: assist to power up, cues for hand placement with rw.    Balance Overall balance assessment: Needs assistance Sitting-balance support: No upper extremity supported;Feet supported Sitting balance-Leahy Scale: Fair     Standing balance support: Bilateral upper extremity supported;During functional  activity Standing balance-Leahy Scale: Poor Standing balance comment: external support for balance                           ADL either performed or assessed with clinical judgement   ADL Overall ADL's : Needs assistance/impaired Eating/Feeding: Set up;Sitting   Grooming: Minimal assistance;Standing   Upper Body Bathing: Moderate assistance;Sitting   Lower Body Bathing: Moderate assistance;Sit to/from stand   Upper Body Dressing : Moderate assistance;Sitting   Lower Body Dressing: Moderate assistance;Sit to/from stand   Toilet Transfer: Ambulation;RW;Minimal assistance(3n1 over toilet)   Toileting- Clothing Manipulation and Hygiene: Moderate assistance;Sit to/from stand   Tub/ Banker: Walk-in shower;Ambulation;3 in 1;Rolling walker;Minimal assistance   Functional mobility during ADLs: Minimal assistance;Rolling walker General ADL Comments: Pt completed bed mobility, 2x sit<>stand from EOB, ambulated to bathroom and ctoilet transfer as detailed above. Mod A from regular height surfaces, Min A from elevated bed height.      Vision         Perception     Praxis      Pertinent Vitals/Pain Pain Assessment: Faces Pain Score: 2  Faces Pain Scale: Hurts even more Pain Location: back, mostly 2/10 with 1-2x spasms with 6/10 pain Pain Descriptors / Indicators: Sore Pain Intervention(s): Limited activity within patient's tolerance;Monitored during session;Repositioned     Hand Dominance Right   Extremity/Trunk Assessment Upper Extremity Assessment Upper Extremity Assessment: Overall WFL for tasks assessed   Lower Extremity Assessment Lower Extremity Assessment: Defer to PT evaluation       Communication Communication Communication: No difficulties   Cognition Arousal/Alertness: Awake/alert Behavior During Therapy: WFL for tasks assessed/performed Overall Cognitive Status: Within Functional Limits for tasks  assessed                                      General Comments       Exercises     Shoulder Instructions      Home Living Family/patient expects to be discharged to:: Private residence Living Arrangements: Spouse/significant other Available Help at Discharge: Family Type of Home: House Home Access: Stairs to enter Technical brewer of Steps: 1+1 Entrance Stairs-Rails: None Home Layout: One level     Bathroom Shower/Tub: Occupational psychologist: Standard     Home Equipment: Environmental consultant - 2 wheels;Grab bars - tub/shower;Grab bars - toilet;Hand held shower head;Cane - single point;Shower seat          Prior Functioning/Environment Level of Independence: Independent with assistive device(s)                 OT Problem List: Impaired balance (sitting and/or standing);Decreased knowledge of use of DME or AE;Decreased knowledge of precautions;Pain      OT Treatment/Interventions: Self-care/ADL training;DME and/or AE instruction;Therapeutic activities;Patient/family education;Balance training    OT Goals(Current goals can be found in the care plan section) Acute Rehab OT Goals Patient Stated Goal: back to daily routine OT Goal Formulation: With patient/family Time For Goal Achievement: 01/24/17 Potential to Achieve Goals: Good ADL Goals Pt Will Perform Grooming: with modified independence;standing;sitting Pt Will Perform Upper Body Bathing: with set-up;sitting Pt Will Perform Lower Body Bathing: with supervision;with adaptive equipment;sit to/from stand Pt Will Perform Upper Body Dressing: with set-up;sitting Pt Will Perform Lower Body Dressing: with min guard assist;sit to/from stand;with set-up Pt Will Transfer to Toilet: with supervision;ambulating Pt Will Perform Toileting - Clothing Manipulation and hygiene: sit to/from stand;with modified independence;with set-up Pt Will Perform Tub/Shower Transfer: Shower transfer;with min guard assist;ambulating;3 in 1;rolling  walker Additional ADL Goal #1: Pt will complete bed mobility at min guard level to prepare for OOB ADLs.  OT Frequency: Min 2X/week   Barriers to D/C:    daughter has arranged time off work and is able to assist physically at home at d/c       Co-evaluation              AM-PAC PT "6 Clicks" Daily Activity     Outcome Measure Help from another person eating meals?: None Help from another person taking care of personal grooming?: A Little Help from another person toileting, which includes using toliet, bedpan, or urinal?: A Little Help from another person bathing (including washing, rinsing, drying)?: A Little Help from another person to put on and taking off regular upper body clothing?: A Little Help from another person to put on and taking off regular lower body clothing?: A Little 6 Click Score: 19   End of Session Equipment Utilized During Treatment: Gait belt;Rolling walker;Back brace Nurse Communication: Mobility status  Activity Tolerance: Patient tolerated treatment well Patient left: in chair;with call bell/phone within reach;with family/visitor present  OT Visit Diagnosis: Unsteadiness on feet (R26.81);Pain                Time: 6629-4765 OT Time Calculation (min): 38 min Charges:  OT General Charges $OT Visit: 1 Visit OT Evaluation $OT Eval Low Complexity: 1 Low OT Treatments $Self Care/Home Management : 8-22 mins G-Codes:      Hortencia Pilar 01/17/2017, 10:08 AM

## 2017-01-17 NOTE — Progress Notes (Signed)
Subjective: Patient reports doing better  Objective: Vital signs in last 24 hours: Temp:  [97.6 F (36.4 C)-98.4 F (36.9 C)] 98.2 F (36.8 C) (01/05 0458) Pulse Rate:  [51-71] 66 (01/05 0458) Resp:  [11-28] 16 (01/05 0458) BP: (91-145)/(52-76) 103/52 (01/05 0458) SpO2:  [93 %-100 %] 99 % (01/05 0458) Weight:  [130.9 kg (288 lb 9.6 oz)] 130.9 kg (288 lb 9.6 oz) (01/04 0844)  Intake/Output from previous day: 01/04 0701 - 01/05 0700 In: 1725 [I.V.:1600; Blood:125] Out: 2900 [Urine:2100; Blood:400] Intake/Output this shift: No intake/output data recorded.  Physical Exam: Strength full both legs.  Dressing CDI.  Lab Results: No results for input(s): WBC, HGB, HCT, PLT in the last 72 hours. BMET No results for input(s): NA, K, CL, CO2, GLUCOSE, BUN, CREATININE, CALCIUM in the last 72 hours.  Studies/Results: Dg Lumbar Spine 2-3 Views  Result Date: 01/16/2017 CLINICAL DATA:  Lumbar fusion. EXAM: DG C-ARM 61-120 MIN; LUMBAR SPINE - 2-3 VIEW COMPARISON:  Prior exam of 01/16/2017. FINDINGS: Lumbar spine numbered as per prior study.Pedicle screws are noted at L3, L4, and L5. Inter disc fusion L3-L4 and L4-L5. IMPRESSION: Postsurgical changes lumbar spine. Electronically Signed   By: Marcello Moores  Register   On: 01/16/2017 14:44   Dg Lumbar Spine 1 View  Result Date: 01/16/2017 CLINICAL DATA:  L3-L5 posterior fusion. EXAM: LUMBAR SPINE - 1 VIEW COMPARISON:  MRI lumbar spine dated December 05, 2016. FINDINGS: Surgical instrumentation projects over the posterior elements at L3, L4, and L5. Vertebral body heights are preserved. Alignment is normal. Mild disc height loss at L3-L4. IMPRESSION: Intraoperative localization as described above. Electronically Signed   By: Titus Dubin M.D.   On: 01/16/2017 11:59   Dg C-arm 1-60 Min  Result Date: 01/16/2017 CLINICAL DATA:  Lumbar fusion. EXAM: DG C-ARM 61-120 MIN; LUMBAR SPINE - 2-3 VIEW COMPARISON:  Prior exam of 01/16/2017. FINDINGS: Lumbar spine  numbered as per prior study.Pedicle screws are noted at L3, L4, and L5. Inter disc fusion L3-L4 and L4-L5. IMPRESSION: Postsurgical changes lumbar spine. Electronically Signed   By: Marcello Moores  Register   On: 01/16/2017 14:44    Assessment/Plan: Doing well POD 1.  Mobilize with PT.  Foley out.  Continue JP today (200 cc out).    LOS: 1 day    Peggyann Shoals, MD 01/17/2017, 7:41 AM

## 2017-01-17 NOTE — Anesthesia Postprocedure Evaluation (Signed)
Anesthesia Post Note  Patient: Joseph Bryan  Procedure(s) Performed: Redo decompression/laminectomy Lumbar three-four , Lumbar four-five, with posterior lumbar interbody fusion (N/A Back)     Patient location during evaluation: PACU Anesthesia Type: General Level of consciousness: awake and alert Pain management: pain level controlled Vital Signs Assessment: post-procedure vital signs reviewed and stable Respiratory status: spontaneous breathing, nonlabored ventilation, respiratory function stable and patient connected to nasal cannula oxygen Cardiovascular status: blood pressure returned to baseline and stable Postop Assessment: no apparent nausea or vomiting Anesthetic complications: no    Last Vitals:  Vitals:   01/17/17 0030 01/17/17 0458  BP: (!) 97/54 (!) 103/52  Pulse: 65 66  Resp: 18 16  Temp: 36.9 C 36.8 C  SpO2: 100% 99%    Last Pain:  Vitals:   01/17/17 0458  TempSrc: Oral  PainSc:                  Joseph Bryan

## 2017-01-17 NOTE — Evaluation (Signed)
Physical Therapy Evaluation Patient Details Name: Joseph Bryan MRN: [id number] DOB: 07-Jun-1940 Today's Date: 01/17/2017   History of Present Illness  Pt is a 77 y.o. male s/p PLIF.  Clinical Impression  Patient is s/p above surgery resulting in the deficits listed below (see PT Problem List). PTA, pt mod I functional mobility with RW. On eval, he required mod assist bed mobility, mod assist sit to stand, and min assist ambulation 100 feet with RW.  Patient will benefit from skilled PT to increase their independence and safety with mobility (while adhering to their precautions) to allow discharge to the venue listed below.     Follow Up Recommendations Home health PT;Supervision/Assistance - 24 hour    Equipment Recommendations  None recommended by PT    Recommendations for Other Services       Precautions / Restrictions Precautions Precautions: Back Precaution Booklet Issued: No Precaution Comments: educated on back precautions. Handout provided. Required Braces or Orthoses: Spinal Brace Spinal Brace: Lumbar corset;Applied in sitting position Restrictions Weight Bearing Restrictions: No      Mobility  Bed Mobility Overal bed mobility: Needs Assistance Bed Mobility: Rolling;Sidelying to Sit Rolling: Mod assist Sidelying to sit: Mod assist       General bed mobility comments: Pt received in recliner. Mod assist to complete bed mobility per OT eval.   Transfers Overall transfer level: Needs assistance Equipment used: Rolling walker (2 wheeled) Transfers: Sit to/from Stand Sit to Stand: Mod assist         General transfer comment: verbal cues for hand placement. Assist to power up. Increased time to stabilize initial standing balance.   Ambulation/Gait Ambulation/Gait assistance: Min assist Ambulation Distance (Feet): 100 Feet Assistive device: Rolling walker (2 wheeled) Gait Pattern/deviations: Step-through pattern;Decreased stride length Gait velocity:  decreased Gait velocity interpretation: Below normal speed for age/gender General Gait Details: verbal cues for posture and to stay close to [employer]            Wheelchair Mobility    Modified Rankin (Stroke Patients Only)       Balance Overall balance assessment: Needs assistance Sitting-balance support: No upper extremity supported;Feet supported Sitting balance-Leahy Scale: Fair     Standing balance support: Bilateral upper extremity supported;During functional activity Standing balance-Leahy Scale: Poor Standing balance comment: heavy reliance on RW                             Pertinent Vitals/Pain Pain Assessment: 0-10 Pain Score: 5  Faces Pain Scale: Hurts even more Pain Location: back and LLE Pain Descriptors / Indicators: Aching;Sore Pain Intervention(s): Monitored during session;Repositioned    Home Living Family/patient expects to be discharged to:: Private residence Living Arrangements: Spouse/significant other Available Help at Discharge: Family;Available 24 hours/day Type of Home: House Home Access: Stairs to enter Entrance Stairs-Rails: None Entrance Stairs-Number of Steps: 1+1 Home Layout: One level Home Equipment: Walker - 2 wheels;Grab bars - tub/shower;Grab bars - toilet;Hand held shower head;Cane - single point;Shower seat      Prior Function Level of Independence: Independent with assistive device(s)         Comments: RW used for ambulation     Hand Dominance   Dominant Hand: Right    Extremity/Trunk Assessment   Upper Extremity Assessment Upper Extremity Assessment: Overall WFL for tasks assessed    Lower Extremity Assessment Lower Extremity Assessment: RLE deficits/detail;LLE deficits/detail;Generalized weakness RLE: Unable to fully assess due to pain LLE: Unable to  fully assess due to pain    Cervical / Trunk Assessment Cervical / Trunk Assessment: Kyphotic  Communication   Communication: No difficulties   Cognition Arousal/Alertness: Awake/alert Behavior During Therapy: WFL for tasks assessed/performed Overall Cognitive Status: Within Functional Limits for tasks assessed                                        General Comments General comments (skin integrity, edema, etc.): BP 88/50 in chair prior to ambulation. Pt asymptomatic. BP after ambulation 111/51. SpO2 95% on RA.     Exercises     Assessment/Plan    PT Assessment Patient needs continued PT services  PT Problem List Decreased strength;Decreased mobility;Decreased knowledge of precautions;Decreased activity tolerance;Decreased balance;Pain       PT Treatment Interventions Therapeutic activities;Gait training;Patient/family education;Therapeutic exercise;Balance training;Stair training;Functional mobility training    PT Goals (Current goals can be found in the Care Plan section)  Acute Rehab PT Goals Patient Stated Goal: back to daily routine PT Goal Formulation: With patient Time For Goal Achievement: 01/24/17 Potential to Achieve Goals: Good    Frequency Min 5X/week   Barriers to discharge        Co-evaluation               AM-PAC PT "6 Clicks" Daily Activity  Outcome Measure Difficulty turning over in bed (including adjusting bedclothes, sheets and blankets)?: Unable Difficulty moving from lying on back to sitting on the side of the bed? : Unable Difficulty sitting down on and standing up from a chair with arms (e.g., wheelchair, bedside commode, etc,.)?: Unable Help needed moving to and from a bed to chair (including a wheelchair)?: A Lot Help needed walking in hospital room?: A Little Help needed climbing 3-5 steps with a railing? : A Lot 6 Click Score: 10    End of Session Equipment Utilized During Treatment: Gait belt;Back brace Activity Tolerance: Patient tolerated treatment well Patient left: in chair;with call bell/phone within reach Nurse Communication: Mobility status PT Visit  Diagnosis: Muscle weakness (generalized) (M62.81);Other abnormalities of gait and mobility (R26.89)    Time: 2671-2458 PT Time Calculation (min) (ACUTE ONLY): 17 min   Charges:   PT Evaluation $PT Eval Moderate Complexity: 1 Mod     PT G Codes:        Lorrin Goodell, PT  Office # [phone] Pager [phone]   Lorriane Shire 01/17/2017, 11:08 AM

## 2017-01-18 NOTE — Progress Notes (Signed)
Occupational Therapy Treatment Patient Details Name: Joseph Bryan MRN: [id number] DOB: [birth year] Today's Date: 01/18/2017    History of present illness Pt is a 77 y.o. male s/p Redo decompression/laminectomy Lumbar three-four , Lumbar four-five, with posterior lumbar interbody fusion.    OT comments  Pt progressing towards established OT goals. Providing education on AE for LB ADLs. Pt donning underwear with Mod A and donning/doffing socks with Min Guard. Provided handout for carry over to home; wife verbalizing understanding. Will continues to follow as admitted. Continue to recommend dc home with HHOT to optimize safety and independence with ADLs.    Follow Up Recommendations  Home health OT;Supervision/Assistance - 24 hour    Equipment Recommendations  3 in 1 bedside commode    Recommendations for Other Services PT consult    Precautions / Restrictions Precautions Precautions: Back Precaution Booklet Issued: No Precaution Comments: Pt able to recall 2/3 back precaution and required cues for recalling "no lifting" Required Braces or Orthoses: Spinal Brace Spinal Brace: Lumbar corset;Applied in sitting position Restrictions Weight Bearing Restrictions: No       Mobility Bed Mobility Overal bed mobility: Needs Assistance Bed Mobility: Rolling;Sit to Sidelying Rolling: Min assist;Min guard Sidelying to sit: Min assist     Sit to sidelying: Min assist General bed mobility comments: Min A to bring LLE over EOB  Transfers Overall transfer level: Needs assistance Equipment used: Rolling walker (2 wheeled) Transfers: Sit to/from Stand Sit to Stand: Mod assist         General transfer comment: Mod A to power into standing fro mrecliner surface. Feel pt was fatigued from sitting    Balance Overall balance assessment: Needs assistance Sitting-balance support: No upper extremity supported;Feet supported Sitting balance-Leahy Scale: Fair     Standing balance support:  Bilateral upper extremity supported;During functional activity Standing balance-Leahy Scale: Poor Standing balance comment: heavy reliance on RW                           ADL either performed or assessed with clinical judgement   ADL Overall ADL's : Needs assistance/impaired                 Upper Body Dressing : With caregiver independent assisting;Sitting Upper Body Dressing Details (indicate cue type and reason): Pt wife managing brace to doff in preparation for returning to bed.Provided VCs for management Lower Body Dressing: Sit to/from stand;With adaptive equipment;Moderate assistance Lower Body Dressing Details (indicate cue type and reason): Providing education on LB dressing with AE. Pt donning underwear with Mod A for brining over hips and maintaining balance. Pt donning/doffing socks at EOB with AE demonstarting understanding            Functional mobility during ADLs: Minimal assistance;Rolling walker General ADL Comments: Focused session on AE education. Wfie and pt verbalized understanding. Provided handout with information for purchase     Vision       Perception     Praxis      Cognition Arousal/Alertness: Awake/alert Behavior During Therapy: WFL for tasks assessed/performed Overall Cognitive Status: Within Functional Limits for tasks assessed                                          Exercises     Shoulder Instructions       General Comments Wife present throughout session  Pertinent Vitals/ Pain       Pain Assessment: Faces Faces Pain Scale: Hurts even more Pain Location: back and LLE Pain Descriptors / Indicators: Aching;Sore Pain Intervention(s): Monitored during session;Limited activity within patient's tolerance;Repositioned  Home Living                                          Prior Functioning/Environment              Frequency  Min 2X/week        Progress Toward  Goals  OT Goals(current goals can now be found in the care plan section)  Progress towards OT goals: Progressing toward goals  Acute Rehab OT Goals Patient Stated Goal: back to daily routine OT Goal Formulation: With patient/family Time For Goal Achievement: 01/24/17 Potential to Achieve Goals: Good ADL Goals Pt Will Perform Grooming: with modified independence;standing;sitting Pt Will Perform Upper Body Bathing: with set-up;sitting Pt Will Perform Lower Body Bathing: with supervision;with adaptive equipment;sit to/from stand Pt Will Perform Upper Body Dressing: with set-up;sitting Pt Will Perform Lower Body Dressing: with min guard assist;sit to/from stand;with set-up Pt Will Transfer to Toilet: with supervision;ambulating Pt Will Perform Toileting - Clothing Manipulation and hygiene: sit to/from stand;with modified independence;with set-up Pt Will Perform Tub/Shower Transfer: Shower transfer;with min guard assist;ambulating;3 in 1;rolling walker Additional ADL Goal #1: Pt will complete bed mobility at min guard level to prepare for OOB ADLs.  Plan Discharge plan remains appropriate;Frequency remains appropriate    Co-evaluation                 AM-PAC PT "6 Clicks" Daily Activity     Outcome Measure   Help from another person eating meals?: None Help from another person taking care of personal grooming?: A Little Help from another person toileting, which includes using toliet, bedpan, or urinal?: A Little Help from another person bathing (including washing, rinsing, drying)?: A Little Help from another person to put on and taking off regular upper body clothing?: A Little Help from another person to put on and taking off regular lower body clothing?: A Little 6 Click Score: 19    End of Session Equipment Utilized During Treatment: Gait belt;Rolling walker;Back brace  OT Visit Diagnosis: Unsteadiness on feet (R26.81);Pain Pain - part of body: (Back)   Activity  Tolerance Patient tolerated treatment well;Patient limited by fatigue   Patient Left with call bell/phone within reach;with family/visitor present;in bed   Nurse Communication Mobility status        Time: 1351-1420 OT Time Calculation (min): 29 min  Charges: OT General Charges $OT Visit: 1 Visit OT Treatments $Self Care/Home Management : 23-37 mins  [place], OTR/L Acute Rehab Pager: [phone] Office: [place] 01/18/2017, 2:39 PM

## 2017-01-18 NOTE — Progress Notes (Signed)
Physical Therapy Treatment Patient Details Name: Joseph Bryan MRN: [id number] DOB: 08-10-1940 Today's Date: 01/18/2017    History of Present Illness Pt is a 77 y.o. male s/p PLIF.    PT Comments    Patient seen for mobility progression s/p spinal surgery. Patient continues to require some assist for transfers to upright. Limited y fatigue. Reinforced education on precautions and mobility.  Current POC remains appropriate.  Follow Up Recommendations  Home health PT;Supervision/Assistance - 24 hour     Equipment Recommendations  None recommended by PT    Recommendations for Other Services       Precautions / Restrictions Precautions Precautions: Back Precaution Booklet Issued: No Precaution Comments: educated on back precautions. Handout provided. Required Braces or Orthoses: Spinal Brace Spinal Brace: Lumbar corset;Applied in sitting position Restrictions Weight Bearing Restrictions: No    Mobility  Bed Mobility Overal bed mobility: Needs Assistance Bed Mobility: Rolling;Sidelying to Sit Rolling: Min assist Sidelying to sit: Min assist       General bed mobility comments: Min assist to power up to EOB, increased time and effort noted  Transfers Overall transfer level: Needs assistance Equipment used: Rolling walker (2 wheeled) Transfers: Sit to/from Stand Sit to Stand: Min assist         General transfer comment: Patient continues to require physical assist for power up to standig despite elevated surface. Min assist for elevated surface presume moderate assist from lower surface  Ambulation/Gait Ambulation/Gait assistance: Min guard Ambulation Distance (Feet): 110 Feet Assistive device: Rolling walker (2 wheeled) Gait Pattern/deviations: Step-through pattern;Decreased stride length Gait velocity: decreased Gait velocity interpretation: Below normal speed for age/gender General Gait Details: MAX VCs for upright posture, heavy reliance on RW for mobility  and UE support. Limited by pain   Stairs            Wheelchair Mobility    Modified Rankin (Stroke Patients Only)       Balance Overall balance assessment: Needs assistance Sitting-balance support: No upper extremity supported;Feet supported Sitting balance-Leahy Scale: Fair     Standing balance support: Bilateral upper extremity supported;During functional activity Standing balance-Leahy Scale: Poor Standing balance comment: heavy reliance on RW                            Cognition Arousal/Alertness: Awake/alert Behavior During Therapy: WFL for tasks assessed/performed Overall Cognitive Status: Within Functional Limits for tasks assessed                                        Exercises      General Comments        Pertinent Vitals/Pain Pain Assessment: 0-10 Faces Pain Scale: Hurts even more Pain Location: back and LLE Pain Descriptors / Indicators: Aching;Sore Pain Intervention(s): Monitored during session    Home Living                      Prior Function            PT Goals (current goals can now be found in the care plan section) Acute Rehab PT Goals Patient Stated Goal: back to daily routine PT Goal Formulation: With patient Time For Goal Achievement: 01/24/17 Potential to Achieve Goals: Good Progress towards PT goals: Progressing toward goals    Frequency    Min 5X/week      PT Plan  Current plan remains appropriate    Co-evaluation              AM-PAC PT "6 Clicks" Daily Activity  Outcome Measure  Difficulty turning over in bed (including adjusting bedclothes, sheets and blankets)?: Unable Difficulty moving from lying on back to sitting on the side of the bed? : Unable Difficulty sitting down on and standing up from a chair with arms (e.g., wheelchair, bedside commode, etc,.)?: Unable Help needed moving to and from a bed to chair (including a wheelchair)?: A Lot Help needed walking in  hospital room?: A Little Help needed climbing 3-5 steps with a railing? : A Lot 6 Click Score: 10    End of Session Equipment Utilized During Treatment: Gait belt;Back brace Activity Tolerance: Patient tolerated treatment well Patient left: in chair;with call bell/phone within reach Nurse Communication: Mobility status PT Visit Diagnosis: Muscle weakness (generalized) (M62.81);Other abnormalities of gait and mobility (R26.89)     Time: 9935-7017 PT Time Calculation (min) (ACUTE ONLY): 18 min  Charges:  $Gait Training: 8-22 mins                    G Codes:       Alben Deeds, PT DPT  Board Certified Neurologic Specialist [place] 01/18/2017, 12:57 PM

## 2017-01-18 NOTE — Progress Notes (Signed)
Patient ID: Joseph Bryan, male   DOB: [birth year], 77 y.o.   MRN: [id number] BP (!) 86/46 (BP Location: Left Arm)   Pulse 80   Temp 98.8 F (37.1 C) (Oral)   Resp 18   Ht 6\' 1"  (1.854 m)   Wt 130.9 kg (288 lb 9.6 oz)   SpO2 96%   BMI 38.08 kg/m  Alert and oriented x 4, speech is clear and fluent Will leave drain in place Dressing is clean, dry, and without signs of infection Walking better Continue with PT

## 2017-01-18 NOTE — Care Management Note (Signed)
Case Management Note  Patient Details  Name: Joseph Bryan MRN: [id number] Date of Birth: Apr 07, 1940  Subjective/Objective:     Pt presents for redo of laminectomy of lumbar spine.   Pt from home with wife.   Pt uses walker, cane, and CPAP machine at home.  Wife reports they have an old (>77 years old) 3N1 that has been stored in an outside building and is no longer usable.  Pt has used [facility] in the past for services and would like to continue to use for [facility] PT and OT.           Action/Plan: List presented to pt and wife.  They choose to continue with [facility] for [facility] PT and OT.  Services and DME 3n1 will need to be arranged when orders placed.  Per wife, d/c expected in 1-2 days.  Expected Discharge Date:       01/20/17          Expected Discharge Plan:  Joseph Bryan  In-House Referral:  NA  Discharge planning Services  CM Consult  Post Acute Care Choice:  Durable Medical Equipment, Home Health Choice offered to:  Patient, Spouse  DME Arranged:  3-N-1 DME Agency:  [place]:  PT, OT [facility] Agency:  [place]  Status of Service:  In process, will continue to follow  If discussed at Long Length of Stay Meetings, dates discussed:    Additional Comments:  Arley Phenix, RN 01/18/2017, 10:07 AM

## 2017-01-19 MED ORDER — SENNA 8.6 MG PO TABS
1.0000 | ORAL_TABLET | Freq: Every day | ORAL | Status: DC
  Administered 2017-01-19 – 2017-01-21 (×3): 8.6 mg via ORAL
  Filled 2017-01-19 (×4): qty 1

## 2017-01-19 NOTE — Clinical Social Work Note (Signed)
Clinical Social Work Assessment  Patient Details  Name: Joseph Bryan MRN: [id number] Date of Birth: 10/27/1940  Date of referral:  01/19/17               Reason for consult:  Facility Placement                Permission sought to share information with:  Facility Sport and exercise psychologist, Family Supports Permission granted to share information::  Yes, Verbal Permission Granted  Name::     Nancy Marus  Agency::  SNF  Relationship::  Wife, Daughter  Sport and exercise psychologist Information:     Housing/Transportation Living arrangements for the past 2 months:  Single Family Home Source of Information:  Patient, Spouse Patient Interpreter Needed:  None Criminal Activity/Legal Involvement Pertinent to Current Situation/Hospitalization:  No - Comment as needed Significant Relationships:  Adult Children, Spouse Lives with:  Self, Spouse Do you feel safe going back to the place where you live?  Yes Need for family participation in patient care:  No (Coment)  Care giving concerns:  Patient lives at home with spouse and daughter support, but will benefit from short term rehab at discharge because he is needing significant assistance with mobility at this time.   Social Worker assessment / plan:  CSW met with patient and patient's wife at bedside to discuss recommendation for SNF. CSW discussed referral process, insurance authorization, and what to expect with a SNF placement. CSW answered questions and reassured patient that it would only be short term while he recovers from his surgery. CSW sent out referral and confirmed bed availability with [facility] in [place]. CSW to continue to follow.  Employment status:  Retired Nurse, adult PT Recommendations:  [place] / Referral to community resources:  [place]  Patient/Family's Response to care:  Patient and patient's wife agreeable to SNF placement.  Patient/Family's Understanding of  and Emotional Response to Diagnosis, Current Treatment, and Prognosis:  Patient and patient's wife discussed how they weren't happy with it, but they both knew that the patient would not be safe to go home right now. Patient discussed how he's having trouble with his mobility and knows that the patient's wife won't be able to help him get up out of bed. Patient and patient's wife acknowledged that [facility] would be the most convenient option, and they would like to pursue that at discharge. Patient and patient's wife were appreciative of information.  Emotional Assessment Appearance:  Appears stated age Attitude/Demeanor/Rapport:  Engaged Affect (typically observed):  Appropriate, Apprehensive, Accepting Orientation:  Oriented to Self, Oriented to Place, Oriented to  Time, Oriented to Situation Alcohol / Substance use:  Not Applicable Psych involvement (Current and /or in the community):  No (Comment)  Discharge Needs  Concerns to be addressed:  Care Coordination Readmission within the last 30 days:  No Current discharge risk:  Physical Impairment, Dependent with Mobility Barriers to Discharge:  Continued Medical Work up, [place], [place] 01/19/2017, 12:13 PM

## 2017-01-19 NOTE — Progress Notes (Signed)
Physical Therapy Treatment Patient Details Name: Joseph Bryan MRN: [id number] DOB: [birth year] Today's Date: 01/19/2017    History of Present Illness Pt is a 77 y.o. male s/p PLIF.    PT Comments    Pt functioning at [facility] for mobility and mod/maxA for ADLs at this time. Pt only able to ambulate 10'x2 this date with RW. Pt with increased pain today. If pt can not reach supervision level of function pt will need SNF upon d/c as wife unable to assist with greater than supervision level. Pt reports "the pain is just so bad today." Acute PT to con't to follow.    Follow Up Recommendations  SNF     Equipment Recommendations  None recommended by PT    Recommendations for Other Services       Precautions / Restrictions Precautions Precautions: Back Precaution Booklet Issued: No Precaution Comments: pt able to recall 2/3 back precautions, spouse able to recall 3/3, re-educated pt on precautions Required Braces or Orthoses: Spinal Brace Spinal Brace: Lumbar corset;Applied in sitting position Restrictions Weight Bearing Restrictions: No    Mobility  Bed Mobility Overal bed mobility: Needs Assistance Bed Mobility: Rolling;Sit to Sidelying Rolling: Min guard(definite use of bed rail) Sidelying to sit: Min assist       General bed mobility comments: minA to elevate trunk to EOB, strong use of bed rail. Pt reports "I have a wall at home" however educated pt he would not be able to "grip and pull" on the wall. Discussed how his wife has limited ability to pull on pt as well  Transfers Overall transfer level: Needs assistance Equipment used: Rolling walker (2 wheeled) Transfers: Sit to/from Stand Sit to Stand: Min assist(from elevated surface)         General transfer comment: modA from lower surface, v/c's to push up from bed, strong use of rail in bathroom to get up off commode  Ambulation/Gait Ambulation/Gait assistance: Min assist Ambulation Distance (Feet): 10 Feet(x2,  to/from bathroom) Assistive device: Rolling walker (2 wheeled) Gait Pattern/deviations: Step-through pattern;Decreased stride length Gait velocity: slow Gait velocity interpretation: Below normal speed for age/gender General Gait Details: pt unable to achieve full upright posture, c/o pain, pt reports "I'm dizzy" upon initial standing but went away once ambulating. Pt very anxious re: amb down hall due to pain and short stature of PT.   Stairs            Wheelchair Mobility    Modified Rankin (Stroke Patients Only)       Balance Overall balance assessment: Needs assistance Sitting-balance support: No upper extremity supported;Feet supported Sitting balance-Leahy Scale: Fair     Standing balance support: Bilateral upper extremity supported;During functional activity Standing balance-Leahy Scale: Poor Standing balance comment: heavy reliance on RW                            Cognition Arousal/Alertness: Awake/alert Behavior During Therapy: Anxious Overall Cognitive Status: Within Functional Limits for tasks assessed                                 General Comments: pt reports "I don't feel comfortable walking down the hallway with you, youre too little"      Exercises      General Comments General comments (skin integrity, edema, etc.): pt unable to don socks, dependent on wife, wife reports "i'm going down stairs to  get me one of those kits". pt assisted to bathroom, required max directional v/c's. pt unable to peform hygiene requiring maxA      Pertinent Vitals/Pain Pain Assessment: 0-10 Pain Score: 10-Worst pain ever Pain Location: back Pain Descriptors / Indicators: Aching Pain Intervention(s): Monitored during session    Home Living                      Prior Function            PT Goals (current goals can now be found in the care plan section) Progress towards PT goals: Not progressing toward goals - comment(limited  by pain)    Frequency    Min 5X/week      PT Plan Discharge plan needs to be updated    Co-evaluation              AM-PAC PT "6 Clicks" Daily Activity  Outcome Measure  Difficulty turning over in bed (including adjusting bedclothes, sheets and blankets)?: Unable Difficulty moving from lying on back to sitting on the side of the bed? : Unable Difficulty sitting down on and standing up from a chair with arms (e.g., wheelchair, bedside commode, etc,.)?: Unable Help needed moving to and from a bed to chair (including a wheelchair)?: A Lot Help needed walking in hospital room?: A Lot Help needed climbing 3-5 steps with a railing? : Total 6 Click Score: 8    End of Session Equipment Utilized During Treatment: Gait belt;Back brace Activity Tolerance: Patient limited by pain Patient left: in chair;with call bell/phone within reach;with chair alarm set;with family/visitor present Nurse Communication: Mobility status PT Visit Diagnosis: Muscle weakness (generalized) (M62.81);Other abnormalities of gait and mobility (R26.89)     Time: 0109-3235 PT Time Calculation (min) (ACUTE ONLY): 29 min  Charges:  $Gait Training: 8-22 mins $Therapeutic Activity: 8-22 mins                    G Codes:       Kittie Plater, PT, DPT Pager #: [phone] Office #: [phone]    [place] 01/19/2017, 10:21 AM

## 2017-01-19 NOTE — NC FL2 (Signed)
[place] MEDICAID FL2 LEVEL OF CARE SCREENING TOOL     IDENTIFICATION  Patient Name: Joseph Bryan Birthdate: August 02, 1940 Sex: male Admission Date (Current Location): 01/16/2017  [facility] and [place] Number:  Herbalist and Address:  [facility] [place], [place] [address], [place], [place] [address]      Provider Number: [id number]  Attending Physician Name and Address:  Erline Levine, MD  Relative Name and Phone Number:       Current Level of Care: Hospital Recommended Level of Care: [place] Prior Approval Number:    Date Approved/Denied:   PASRR Number: [id number]  Discharge Plan: SNF    Current Diagnoses: Patient Active Problem List   Diagnosis Date Noted  . Lumbar spinal stenosis 01/16/2017  . Hyperlipidemia LDL goal <70 04/30/2016  . Elevated LFTs 04/30/2016  . Spinal stenosis, lumbar region, with neurogenic claudication 10/19/2015  . Morbid obesity ([place]) 12/15/2013  . Leg edema 10/08/2012  . OSA on CPAP 10/08/2012  . RBBB 10/08/2012  . History of MI (myocardial infarction) 10/08/2012  . Anasarca 09/01/2012  . Abdominal pain, acute, right lower quadrant 08/26/2012  . HTN (hypertension) 08/26/2012  . Obesity, unspecified 08/26/2012  . CAD- s/p CABG in 1998 - patent grafts on cath in 2010 08/26/2012    Orientation RESPIRATION BLADDER Height & Weight     Self, Time, Situation, Place  Normal Continent Weight: 288 lb 9.6 oz (130.9 kg) Height:  6\' 1"  (185.4 cm)  BEHAVIORAL SYMPTOMS/MOOD NEUROLOGICAL BOWEL NUTRITION STATUS      Continent    AMBULATORY STATUS COMMUNICATION OF NEEDS Skin   Extensive Assist Verbally Surgical wounds(closed back incision, 01/16/17 with honeycomb dressing)                       Personal Care Assistance Level of Assistance  Feeding, Dressing, Bathing Bathing Assistance: Maximum assistance Feeding assistance: Independent Dressing Assistance: Maximum assistance     Functional  Limitations Info  Sight, Hearing, Speech Sight Info: Adequate Hearing Info: Adequate Speech Info: Adequate    SPECIAL CARE FACTORS FREQUENCY  PT (By licensed PT), OT (By licensed OT)     PT Frequency: 5x/wk OT Frequency: 5x/wk            Contractures Contractures Info: Not present    Additional Factors Info  Code Status, Allergies Code Status Info: Full Allergies Info: CHG Soap and Wipes           Current Medications (01/19/2017):  This is the current hospital active medication list Current Facility-Administered Medications  Medication Dose Route Frequency Provider Last Rate Last Dose  . 0.9 %  sodium chloride infusion  250 mL Intravenous Continuous Erline Levine, MD   Stopped at 01/16/17 1930  . acetaminophen (TYLENOL) tablet 650 mg  650 mg Oral Q4H PRN Erline Levine, MD       Or  . acetaminophen (TYLENOL) suppository 650 mg  650 mg Rectal Q4H PRN Erline Levine, MD      . alum & mag hydroxide-simeth (MAALOX/MYLANTA) 200-200-20 MG/5ML suspension 30 mL  30 mL Oral Q6H PRN Erline Levine, MD      . atenolol (TENORMIN) tablet 50 mg  50 mg Oral Daily Erline Levine, MD      . bisacodyl (DULCOLAX) suppository 10 mg  10 mg Rectal Daily PRN Erline Levine, MD      . dextrose 5 % and 0.45 % NaCl with KCl 20 mEq/L infusion   Intravenous Continuous [name],  Broadus John, MD 75 mL/hr at 01/16/17 2018    . docusate sodium (COLACE) capsule 100 mg  100 mg Oral BID Erline Levine, MD   100 mg at 01/18/17 2058  . furosemide (LASIX) tablet 40 mg  40 mg Oral Daily Erline Levine, MD   40 mg at 01/18/17 0829  . lactated ringers infusion   Intravenous Continuous [name], MD   Stopped at 01/16/17 1510  . latanoprost (XALATAN) 0.005 % ophthalmic solution 1 drop  1 drop Both Eyes QHS Erline Levine, MD   1 drop at 01/18/17 2059  . lisinopril (PRINIVIL,ZESTRIL) tablet 30 mg  30 mg Oral Daily Erline Levine, MD      . menthol-cetylpyridinium (CEPACOL) lozenge 3 mg  1 lozenge Oral PRN Erline Levine, MD        Or  . phenol (CHLORASEPTIC) mouth spray 1 spray  1 spray Mouth/Throat PRN Erline Levine, MD      . methocarbamol (ROBAXIN) tablet 500 mg  500 mg Oral Q6H PRN Erline Levine, MD   500 mg at 01/18/17 1746   Or  . methocarbamol (ROBAXIN) 500 mg in dextrose 5 % 50 mL IVPB  500 mg Intravenous Q6H PRN Erline Levine, MD      . morphine 2 MG/ML injection 2 mg  2 mg Intravenous Q2H PRN Erline Levine, MD   2 mg at 01/16/17 2323  . ondansetron (ZOFRAN) tablet 4 mg  4 mg Oral Q6H PRN Erline Levine, MD       Or  . ondansetron [facility]) injection 4 mg  4 mg Intravenous Q6H PRN Erline Levine, MD      . oxyCODONE (Oxy IR/ROXICODONE) immediate release tablet 10 mg  10 mg Oral Q3H PRN Erline Levine, MD   10 mg at 01/19/17 0742  . oxyCODONE (Oxy IR/ROXICODONE) immediate release tablet 5 mg  5 mg Oral Q3H PRN Erline Levine, MD      . pantoprazole (PROTONIX) EC tablet 40 mg  40 mg Oral QHS Erline Levine, MD   40 mg at 01/18/17 2059  . pyridOXINE (VITAMIN B-6) tablet 100 mg  100 mg Oral Daily Erline Levine, MD   100 mg at 01/18/17 0830  . rosuvastatin (CRESTOR) tablet 10 mg  10 mg Oral q1800 Erline Levine, MD   10 mg at 01/18/17 1746  . senna-docusate (Senokot-S) tablet 1 tablet  1 tablet Oral QHS PRN Erline Levine, MD   1 tablet at 01/18/17 [phone]  . sodium chloride flush (NS) 0.9 % injection 3 mL  3 mL Intravenous Q12H Erline Levine, MD   3 mL at 01/18/17 2059  . sodium chloride flush (NS) 0.9 % injection 3 mL  3 mL Intravenous PRN Erline Levine, MD      . sodium phosphate (FLEET) 7-19 GM/118ML enema 1 enema  1 enema Rectal Once PRN Erline Levine, MD      . vitamin B-12 (CYANOCOBALAMIN) tablet 1,000 mcg  1,000 mcg Oral Daily Erline Levine, MD   1,000 mcg at 01/18/17 9326  . zolpidem (AMBIEN) tablet 5 mg  5 mg Oral QHS PRN Erline Levine, MD         Discharge Medications: Please see discharge summary for a list of discharge medications.  Relevant Imaging Results:  Relevant Lab Results:   Additional  Information SS#: [id number]  Geralynn Ochs, LCSW

## 2017-01-19 NOTE — Progress Notes (Addendum)
Subjective: Patient reports "I didn't have pain until I sat up to eat. ..in my low back and (left) leg"  Objective: Vital signs in last 24 hours: Temp:  [98 F (36.7 C)-99.3 F (37.4 C)] 98 F (36.7 C) (01/07 0656) Pulse Rate:  [78-113] 81 (01/07 0656) Resp:  [18] 18 (01/07 0656) BP: (81-114)/(36-62) 109/57 (01/07 0656) SpO2:  [91 %-96 %] 96 % (01/07 0656)  Intake/Output from previous day: 01/06 0701 - 01/07 0700 In: -  Out: 3125 [Urine:2900; Drains:225] Intake/Output this shift: No intake/output data recorded.  Alert, conversant. Wife present. Reports some lumbar pain, mild left thigh pain. Hemovac with minimal o/p. Incision without erythema, swelling, or drainage beneath honeycomb & Dermabond. Good strength BLE - weaker with dorsiflexion than plantar flexion bilat, but improved.   Lab Results: No results for input(s): WBC, HGB, HCT, PLT in the last 72 hours. BMET No results for input(s): NA, K, CL, CO2, GLUCOSE, BUN, CREATININE, CALCIUM in the last 72 hours.  Studies/Results: No results found.  Assessment/Plan: improved  LOS: 3 days  Per DrStern, d/c hemovac. Mobilize in brace.    Verdis Prime 01/19/2017, 7:42 AM   Patient is making good progress.  Possible discharge in AM. Consider SNF placement for initial postop rehab.   Drain out today.

## 2017-01-20 LAB — BASIC METABOLIC PANEL
Anion gap: 10 (ref 5–15)
BUN: 26 mg/dL — AB (ref 6–20)
CHLORIDE: 97 mmol/L — AB (ref 101–111)
CO2: 25 mmol/L (ref 22–32)
CREATININE: 1.05 mg/dL (ref 0.61–1.24)
Calcium: 8.6 mg/dL — ABNORMAL LOW (ref 8.9–10.3)
GFR calc Af Amer: >60 mL/min (ref >60)
Glucose, Bld: 118 mg/dL — ABNORMAL HIGH (ref 65–99)
Potassium: 4.4 mmol/L (ref 3.5–5.1)
SODIUM: 132 mmol/L — AB (ref 135–145)

## 2017-01-20 LAB — CBC
HCT: 33.7 % — ABNORMAL LOW (ref 39.0–52.0)
Hemoglobin: 10.7 g/dL — ABNORMAL LOW (ref 13.0–17.0)
MCH: 30.1 pg (ref 26.0–34.0)
MCHC: 31.8 g/dL (ref 30.0–36.0)
MCV: 94.7 fL (ref 78.0–100.0)
PLATELETS: 140 10*3/uL — AB (ref 150–400)
RBC: 3.56 MIL/uL — ABNORMAL LOW (ref 4.22–5.81)
RDW: 14.2 % (ref 11.5–15.5)
WBC: 14.4 10*3/uL — ABNORMAL HIGH (ref 4.0–10.5)

## 2017-01-20 MED ORDER — FUROSEMIDE 10 MG/ML IJ SOLN
40.0000 mg | Freq: Once | INTRAMUSCULAR | Status: AC
  Administered 2017-01-20: 40 mg via INTRAVENOUS
  Filled 2017-01-20: qty 4

## 2017-01-20 NOTE — Progress Notes (Signed)
OT Cancellation Note  Patient Details Name: TIERRE NETTO MRN: [id number] DOB: 12/02/1940   Cancelled Treatment:    Reason Eval/Treat Not Completed: Fatigue/lethargy limiting ability to participate.  Pt just back to bed.  Will reattempt.  Antionne Enrique [place], OTR/L [phone]   [name] M 01/20/2017, 11:19 AM

## 2017-01-20 NOTE — Progress Notes (Signed)
Patient ID: Joseph Bryan, male   DOB: [birth year], 77 y.o.   MRN: [id number] Pt working with PT in hallway.   Per DrStern, IV Lasix 40mg , BMP, CBC. Orders entered.

## 2017-01-20 NOTE — Progress Notes (Signed)
Physical Therapy Treatment Patient Details Name: Joseph Bryan MRN: [id number] DOB: 1940/07/05 Today's Date: 01/20/2017    History of Present Illness Pt is a 77 y.o. male s/p PLIF. PMH includes MI, HTN, CKD, CAD, glaucoma, ca.    PT Comments    Patient progressing slowly towards PT goals. Improved ambulation distance today with Min A for balance/safety. Continues to have difficulty standing from low surfaces requiring max cues and Mod A of 2 to power up. Pt not able to recall any back precautions today and even has trouble within session when educated. Pt also with 3/4 DOE and HR ranging from 126-137 bpm throughout session. Seems to have some issues with problem solving and memory and requires max encouragement to participate in mobility. Recommending SNF as wife not able to care for pt at this time. Will follow.  Follow Up Recommendations  SNF     Equipment Recommendations  None recommended by PT    Recommendations for Other Services       Precautions / Restrictions Precautions Precautions: Back Precaution Booklet Issued: No Precaution Comments: Not able to recall any precautions Required Braces or Orthoses: Spinal Brace Spinal Brace: Lumbar corset;Applied in sitting position Restrictions Weight Bearing Restrictions: No    Mobility  Bed Mobility               General bed mobility comments: Sitting in chair upon PT arrival.   Transfers Overall transfer level: Needs assistance Equipment used: Rolling walker (2 wheeled) Transfers: Sit to/from Stand Sit to Stand: Mod assist;+2 physical assistance         General transfer comment: Assist of 2 to stand from low chair surface with cues for foot placement, hand placement and technique, using body momentum. Repetition of cues needed as pt wanting to pull up on RW. Stood from Automotive engineer.   Ambulation/Gait Ambulation/Gait assistance: Min assist;+2 safety/equipment Ambulation Distance (Feet): 26 Feet(+15') Assistive  device: Rolling walker (2 wheeled) Gait Pattern/deviations: Step-through pattern;Decreased stride length;Trunk flexed Gait velocity: slow Gait velocity interpretation: Below normal speed for age/gender General Gait Details: SLow, unsteady gait with cues for upright posture. Requires max encouragment. HR ranged from 125-137 bpm. 3/4 DOE. 1 seated rest break.   Stairs            Wheelchair Mobility    Modified Rankin (Stroke Patients Only)       Balance Overall balance assessment: Needs assistance Sitting-balance support: No upper extremity supported;Feet supported Sitting balance-Leahy Scale: Poor Sitting balance - Comments: Only able to sit unsupported for a few seconds and then fatigues requiring back support or UE support.    Standing balance support: During functional activity;Bilateral upper extremity supported Standing balance-Leahy Scale: Poor Standing balance comment: heavy reliance on RW                            Cognition Arousal/Alertness: Awake/alert Behavior During Therapy: Anxious Overall Cognitive Status: Impaired/Different from baseline Area of Impairment: Memory;Problem solving;Following commands                     Memory: Decreased short-term memory;Decreased recall of precautions Following Commands: Follows multi-step commands inconsistently;Follows multi-step commands with increased time     Problem Solving: Slow processing;Difficulty sequencing;Decreased initiation;Requires verbal cues General Comments: Not able to recall precautions even with cues and when told within session. Slow to respond to questions asked, requiring repetition and cues. Difficulty following some simple commands, "bring feet back."  Exercises      General Comments General comments (skin integrity, edema, etc.): Wife present during session.       Pertinent Vitals/Pain Pain Assessment: Faces Faces Pain Scale: Hurts little more Pain Location:   back and LEs Pain Descriptors / Indicators: Aching;Operative site guarding Pain Intervention(s): Monitored during session;Premedicated before session;Repositioned    Home Living                      Prior Function            PT Goals (current goals can now be found in the care plan section) Progress towards PT goals: Progressing toward goals    Frequency    Min 5X/week      PT Plan Current plan remains appropriate    Co-evaluation              AM-PAC PT "6 Clicks" Daily Activity  Outcome Measure  Difficulty turning over in bed (including adjusting bedclothes, sheets and blankets)?: Unable Difficulty moving from lying on back to sitting on the side of the bed? : Unable Difficulty sitting down on and standing up from a chair with arms (e.g., wheelchair, bedside commode, etc,.)?: Unable Help needed moving to and from a bed to chair (including a wheelchair)?: A Lot Help needed walking in hospital room?: A Little Help needed climbing 3-5 steps with a railing? : Total 6 Click Score: 9    End of Session Equipment Utilized During Treatment: Gait belt;Back brace Activity Tolerance: Patient limited by pain Patient left: in chair;with call bell/phone within reach;with family/visitor present Nurse Communication: Mobility status PT Visit Diagnosis: Muscle weakness (generalized) (M62.81);Other abnormalities of gait and mobility (R26.89)     Time: 8850-2774 PT Time Calculation (min) (ACUTE ONLY): 27 min  Charges:  $Gait Training: 8-22 mins $Therapeutic Activity: 8-22 mins                    G Codes:       Wray Kearns, PT, DPT [phone]     [name] 01/20/2017, 10:29 AM

## 2017-01-20 NOTE — Progress Notes (Signed)
Occupational Therapy Treatment Patient Details Name: Joseph Bryan MRN: [id number] DOB: 03/17/1940 Today's Date: 01/20/2017    History of present illness Pt is a 77 y.o. male s/p PLIF. PMH includes MI, HTN, CKD, CAD, glaucoma, ca.   OT comments  Pt moves very slowly, requires max cues to problem solve and sequence tasks.  He is unable to recall back precautions.  He requires max A for ADLs and min A +2 for functional transfers.   Agree with recommendation for SNF.   Follow Up Recommendations  SNF    Equipment Recommendations  3 in 1 bedside commode    Recommendations for Other Services      Precautions / Restrictions Precautions Precautions: Back Precaution Booklet Issued: No Precaution Comments: requires max cues to recall precautions  Required Braces or Orthoses: Spinal Brace Spinal Brace: Lumbar corset;Applied in sitting position       Mobility Bed Mobility Overal bed mobility: Needs Assistance Bed Mobility: Rolling;Sidelying to Sit Rolling: Min assist Sidelying to sit: Min assist       General bed mobility comments: step by step cues provided as well as assist to guide pt   Transfers Overall transfer level: Needs assistance Equipment used: Rolling walker (2 wheeled) Transfers: Sit to/from Joseph Bryan Sit to Stand: Min assist;+2 safety/equipment Stand pivot transfers: Min assist;+2 safety/equipment       General transfer comment: assist to move into standing.  Max cues for problem solving and hand placement     Balance Overall balance assessment: Needs assistance Sitting-balance support: Bilateral upper extremity supported Sitting balance-Leahy Scale: Poor     Standing balance support: Bilateral upper extremity supported Standing balance-Leahy Scale: Poor Standing balance comment: heavy reliance on RW                           ADL either performed or assessed with clinical judgement   ADL Overall ADL's : Needs  assistance/impaired Eating/Feeding: Set up;Sitting       Upper Body Bathing: Maximal assistance;Sitting   Lower Body Bathing: Maximal assistance;Sit to/from stand           Toilet Transfer: Minimal assistance;+2 for safety/equipment;Stand-pivot;BSC;RW   Toileting- Clothing Manipulation and Hygiene: Total assistance;Sit to/from stand Toileting - Clothing Manipulation Details (indicate cue type and reason): Pt incontinent of urine.  Assisted with clean up      Functional mobility during ADLs: Minimal assistance;+2 for safety/equipment;Rolling walker       Vision       Perception     Praxis      Cognition Arousal/Alertness: Awake/alert Behavior During Therapy: Anxious;Impulsive Overall Cognitive Status: Impaired/Different from baseline Area of Impairment: Attention;Memory;Following commands;Safety/judgement;Problem solving                   Current Attention Level: Sustained;Focused Memory: Decreased short-term memory;Decreased recall of precautions Following Commands: Follows one step commands consistently(with mod cues ) Safety/Judgement: Decreased awareness of safety;Decreased awareness of deficits   Problem Solving: Slow processing;Decreased initiation;Difficulty sequencing;Requires verbal cues;Requires tactile cues General Comments: perseveration noted.   wife tends to distract pt         Exercises     Shoulder Instructions       General Comments      Pertinent Vitals/ Pain       Pain Assessment: Faces Faces Pain Scale: Hurts little more Pain Location:  back and LEs Pain Descriptors / Indicators: Aching;Operative site guarding Pain Intervention(s): Monitored during session  Home Living  Prior Functioning/Environment              Frequency  Min 2X/week        Progress Toward Goals  OT Goals(current goals can now be found in the care plan section)  Progress towards OT  goals: Not progressing toward goals - comment     Plan Discharge plan needs to be updated    Co-evaluation                 AM-PAC PT "6 Clicks" Daily Activity     Outcome Measure   Help from another person eating meals?: None Help from another person taking care of personal grooming?: A Little Help from another person toileting, which includes using toliet, bedpan, or urinal?: A Lot Help from another person bathing (including washing, rinsing, drying)?: A Lot Help from another person to put on and taking off regular upper body clothing?: A Lot Help from another person to put on and taking off regular lower body clothing?: A Lot 6 Click Score: 15    End of Session Equipment Utilized During Treatment: Gait belt;Rolling walker;Back brace  OT Visit Diagnosis: Unsteadiness on feet (R26.81)   Activity Tolerance Patient limited by fatigue   Patient Left in chair;with call bell/phone within reach;with family/visitor present   Nurse Communication Mobility status        Time: 1637-1700 OT Time Calculation (min): 23 min  Charges: OT General Charges $OT Visit: 1 Visit OT Treatments $Self Care/Home Management : 23-37 mins  Joseph Bryan, Joseph Bryan [phone]    Joseph Bryan 01/20/2017, 5:08 PM

## 2017-01-20 NOTE — Progress Notes (Addendum)
Subjective: Patient reports "I'm not doing a whole lot"  Objective: Vital signs in last 24 hours: Temp:  [98.1 F (36.7 C)-99.3 F (37.4 C)] 98.7 F (37.1 C) (01/08 0504) Pulse Rate:  [69-89] 78 (01/08 0504) Resp:  [18-20] 18 (01/08 0504) BP: (91-120)/(49-59) 109/49 (01/08 0504) SpO2:  [92 %-97 %] 94 % (01/08 0504)  Intake/Output from previous day: 01/07 0701 - 01/08 0700 In: -  Out: 500 [Urine:500] Intake/Output this shift: No intake/output data recorded.  Awake, sitting in chair. Reports some persistent back pain and notes two episodes of LLE weakness. PT noted significant mobility issues yesterday. Pt agrees, concerned that wife's SBA may not be safe for either of them.  Conversation reveals mild dyspnes, wet cough. Auscultation reveals rales BLL, only partially cleared with cough. Pretibial edema (long hx) at +1 to +2 bilaterally.  Strength is good BLE, but his endurance remains low and gait is unsteady. Lumbar incision without erythema, swelling, or drainage beneath honeycomb and Dermabond.   Lab Results: No results for input(s): WBC, HGB, HCT, PLT in the last 72 hours. BMET No results for input(s): NA, K, CL, CO2, GLUCOSE, BUN, CREATININE, CALCIUM in the last 72 hours.  Studies/Results: No results found.  Assessment/Plan:   LOS: 4 days  Incentive spirometer. Continue to mobilize with PT/OT. Planning for SNF.    Joseph Bryan 01/20/2017, 9:17 AM  Patient requiring assistance to ambulate.  He will need SNF for immediate postop recovery.  Will give additional dose of lasix and check CBC, BMP today.

## 2017-01-20 NOTE — Care Management Important Message (Signed)
Important Message  Patient Details  Name: Joseph Bryan MRN: [id number] Date of Birth: 1940-02-05   Medicare Important Message Given:  Yes    Nathen May 01/20/2017, 9:59 AM

## 2017-01-21 ENCOUNTER — Inpatient Hospital Stay (HOSPITAL_COMMUNITY): Payer: Medicare Other

## 2017-01-21 MED ORDER — SODIUM CHLORIDE 0.9 % IV BOLUS (SEPSIS)
500.0000 mL | Freq: Once | INTRAVENOUS | Status: AC
  Administered 2017-01-21: 500 mL via INTRAVENOUS

## 2017-01-21 NOTE — Progress Notes (Signed)
Discussed CPAP with pt,  Pt to make arrangements to have home unit brought in.  Will attempt to provide [facility] unit when available in interim.

## 2017-01-21 NOTE — Progress Notes (Signed)
PT Cancellation Note  Patient Details Name: Joseph Bryan MRN: [id number] DOB: 1940-01-16   Cancelled Treatment:    Reason Eval/Treat Not Completed: Patient not medically ready. Pt with low BP and going to receive a 500 bolus per RN. Per RN pt was up all morning and was just returned to bed. Acute PT to return as able.   Nicole Hafley M [name] 01/21/2017, 12:01 PM   Kittie Plater, PT, DPT Pager #: [phone] Office #: [phone]

## 2017-01-21 NOTE — Progress Notes (Signed)
Pt bp= 112/57 after bolus dose, pulse 72. Will continue to monitor.

## 2017-01-21 NOTE — Care Management Note (Signed)
Case Management Note  Patient Details  Name: CHANCELOR HARDRICK MRN: [id number] Date of Birth: Apr 16, 1940  Subjective/Objective:    Pt s/p lumbar fusion. He is from home with his spouse.              Action/Plan: PT/OT recommending SNF. Pt with low BP requiring IVF bolus today. CM following.   Expected Discharge Date:                  Expected Discharge Plan:  [place]  In-House Referral:  NA  Discharge planning Services  CM Consult  Post Acute Care Choice:  Durable Medical Equipment, Home Health Choice offered to:  Patient, Spouse  DME Arranged:  3-N-1 DME Agency:  [place]:  PT, OT [facility] Agency:  [place]  Status of Service:  In process, will continue to follow  If discussed at Long Length of Stay Meetings, dates discussed:    Additional Comments:  Pollie Friar, RN 01/21/2017, 12:09 PM

## 2017-01-21 NOTE — Progress Notes (Signed)
Pt continues to be hypotensive but not symptomatic. Pt is laying in bed and says he feels better now than he has been feeling. Will continue to monitor.

## 2017-01-21 NOTE — Progress Notes (Signed)
Called about patient needed CT HEAD for decreased LOC. I was asked for transport help not as a RR call. I went to help with RN take the patient to CT. I was transporting to CT when I got called away to an emergency. RN took patient to CT, I suggested to RN that patient might need narcan given that he is post op lumbar surgery but currently patient was stable and breathing adequately so we will get a CT and monitor his status.  I called the RN at 350 for an update, patient's mental status improved, patient was alert and conversant, neuro status improved. I advised RN to inform [facility] MD on call when CT results and update them on the patient's improvement.   I instructed the RN to be judiciously when giving pain medications, patient has received multiplie doses of oxy IR, IV moprhine, and robaxin.  Per RN, mental status was altered since yesterday afternoon.   VSS. Call RR if needed.  Start time 233 End Time 300

## 2017-01-21 NOTE — Progress Notes (Signed)
Patients head CT was unremarkable for stroke, I contacted Joseph Bryan and gave the update.

## 2017-01-21 NOTE — Progress Notes (Signed)
Patient ID: Joseph Bryan, male   DOB: [birth year], 77 y.o.   MRN: [id number] [facility] for 531ml NS bolus for hypotension per DrStern. Order entered. Hopeful of SNF tomorrow if BP normalizes and he does well tonight resuming his CPAP.

## 2017-01-21 NOTE — Progress Notes (Signed)
Called Joseph Bryan to report hypotension. Pt is laying in bed and says he feels rough. A&Ox4. Temp 98.9, BP 109/39, Pulse 98, Resp 18 on 2L nasal cannula.  Will continue to monitor.

## 2017-01-21 NOTE — Progress Notes (Addendum)
Subjective: Patient reports "I feel better...rough night"  Objective: Vital signs in last 24 hours: Temp:  [98.2 F (36.8 C)-99.5 F (37.5 C)] 98.5 F (36.9 C) (01/09 0543) Pulse Rate:  [76-126] 84 (01/09 0543) Resp:  [20] 20 (01/09 0543) BP: (84-113)/(43-69) 107/68 (01/09 0543) SpO2:  [90 %-98 %] 98 % (01/09 0543)  Intake/Output from previous day: 01/08 0701 - 01/09 0700 In: 363 [P.O.:360; I.V.:3] Out: 350 [Urine:350] Intake/Output this shift: No intake/output data recorded.  Awakens to voice. Reports persistent mild limbar pain as expected. Denies dyspnea this am. MAEW. Incision without erythema, swelling, or drainage beneath honeycomb & Dermabond. Reports from Nursing: episode of confusion and poor wordfinding during the night. Head CT ok.  Oxygen placed upon return from CT. No further issues. Pt now reports home CPAP use.   Lab Results: Recent Labs    01/20/17 1127  WBC 14.4*  HGB 10.7*  HCT 33.7*  PLT 140*   BMET Recent Labs    01/20/17 1127  NA 132*  K 4.4  CL 97*  CO2 25  GLUCOSE 118*  BUN 26*  CREATININE 1.05  CALCIUM 8.6*    Studies/Results: Ct Head Wo Contrast  Result Date: 01/21/2017 CLINICAL DATA:  Altered level of consciousness. Change in cognition. EXAM: CT HEAD WITHOUT CONTRAST TECHNIQUE: Contiguous axial images were obtained from the base of the skull through the vertex without intravenous contrast. COMPARISON:  None. FINDINGS: Brain: Generalized cerebral atrophy, normal for age. Moderate chronic small vessel ischemia. 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 No intracranial hemorrhage, mass effect, or midline shift. No hydrocephalus. The basilar cisterns are patent. No evidence of territorial infarct or acute ischemia. No extra-axial or intracranial fluid collection. Vascular: Atherosclerosis of skullbase vasculature without hyperdense vessel or abnormal calcification. Skull: No fracture or focal lesion. Sinuses/Orbits: Near complete  opacification of the left maxillary sinus is likely chronic with associated sinus thickening and sclerosis. Opacification of few inferior right mastoid air cells of uncertain acuity. Visualized orbits are unremarkable. Other: None. IMPRESSION: 1. Atrophy and chronic small vessel ischemia.  No acute abnormality. 2. Chronic left maxillary sinusitis. Electronically Signed   By: Jeb Levering M.D.   On: 01/21/2017 03:50    Assessment/Plan: Stable this morning  LOS: 5 days  Continue to mobilize in brace, monitoring respiratory status and pain/medication status. Will need CPAP here & SNF.    PoteatAaron Edelman 01/21/2017, 7:25 AM   Resume patient's CPAP.  Anticipate SNF placement in near future.

## 2017-01-21 NOTE — Progress Notes (Signed)
Patient is trying exit bed not following instruction and giving 1 word responses that may or may not be appropriate. According to wife this is a change from baseline, patients exhibiting mental decline will contact [facility] on call staff to up date them on change in condition.

## 2017-01-21 NOTE — Progress Notes (Signed)
Received verbal order from [facility] for stat head CT, awaiting results. While putting patient back on bed for transport back to floor patient became alert and gave me a clear and appropriate verbal response to his condition and made good eye contact. Rapid response remarked to me while transporting patient to CT that possible narcaning patient to rule out toxic effects of pain medication may have been another option.

## 2017-01-22 MED ORDER — METHOCARBAMOL 750 MG PO TABS: 375.0000 mg | ORAL_TABLET | Freq: Four times a day (QID) | ORAL | 1 refills | Status: DC | PRN

## 2017-01-22 MED ORDER — OXYCODONE HCL 5 MG PO TABS: 5.0000 mg | ORAL_TABLET | ORAL | 0 refills | Status: DC | PRN

## 2017-01-22 NOTE — Clinical Social Work Placement (Signed)
Nurse to call report to [phone], Room [place]  NOTE  Date:  01/22/2017  Patient Details  Name: Joseph Bryan MRN: [id number] Date of Birth: 1940/08/25  Clinical Social Work is seeking post-discharge placement for this patient at the [place] level of care (*CSW will initial, date and re-position this form in  chart as items are completed):  Yes   Patient/family provided with [facility] Work Department's list of facilities offering this level of care within the geographic area requested by the patient (or if unable, by the patient's family).  Yes   Patient/family informed of their freedom to choose among providers that offer the needed level of care, that participate in Medicare, Medicaid or managed care program needed by the patient, have an available bed and are willing to accept the patient.  Yes   Patient/family informed of [facility]'s ownership interest in [facility] and [facility], as well as of the fact that they are under no obligation to receive care at these facilities.  PASRR submitted to EDS on 01/19/17     PASRR number received on 01/19/17     Existing PASRR number confirmed on       FL2 transmitted to all facilities in geographic area requested by pt/family on 01/19/17     FL2 transmitted to all facilities within larger geographic area on       Patient informed that his/her managed care company has contracts with or will negotiate with certain facilities, including the following:        Yes   Patient/family informed of bed offers received.  Patient chooses bed at [place], [place]     Physician recommends and patient chooses bed at      Patient to be transferred to [place] on 01/22/17.  Patient to be transferred to facility by [facility]     Patient family notified on 01/22/17 of transfer.  Name of family member notified:  Hassan Rowan     PHYSICIAN        Additional Comment:    _______________________________________________ Geralynn Ochs, LCSW 01/22/2017, 3:01 PM

## 2017-01-22 NOTE — Discharge Summary (Signed)
Physician Discharge Summary  Patient ID: Joseph Bryan MRN: [id number] DOB/AGE: [birth year] 77 y.o.  Admit date: 01/16/2017 Discharge date: 01/22/2017  Admission Diagnoses: Spinal stenosis of lumbar region with neurogenic claudication, herniated lumbar disc, radiculopathy, lumbago L 34 and L 45 levels    Discharge Diagnoses: Spinal stenosis of lumbar region with neurogenic claudication, herniated lumbar disc, radiculopathy, lumbago L 34 and L 45 levels s/p Redo decompression/laminectomy Lumbar three-four , Lumbar four-five, with posterior lumbar interbody fusion (N/A) with PEEK PLIF cages, autograft, pedicle screw fixation L 3 - L 5 levels with posterolateral arthrodesis L 3 - L 5 levels Decompression greater than standard PLIF procedure with microdiscectomy with microdissection   Active Problems:   Lumbar spinal stenosis   Discharged Condition: good  Hospital Course: Rajvir Ernster was admitted for surgery with lumbar stenosis and radiculopathy. Following uncomplicated redo decompression with fusion L3-5, pt recovered well. Pt was slow to mobilize. Increased diuretic corrected BLE edema and dyspnea. CPAP ordered, but not initiated. Confusion episode prompted CT which ruled out stroke. Another episode of confusion self-corrected quickly while awake on oxygen. Pain is presently well controlled with no further episodes of confusion.   Consults: None  Significant Diagnostic Studies: radiology: X-Ray: intra-op  Treatments: surgery: Redo decompression/laminectomy Lumbar three-four , Lumbar four-five, with posterior lumbar interbody fusion (N/A) with PEEK PLIF cages, autograft, pedicle screw fixation L 3 - L 5 levels with posterolateral arthrodesis L 3 - L 5 levels  Decompression greater than standard PLIF procedure with microdiscectomy with microdissection    Discharge Exam: Blood pressure 113/65, pulse 76, temperature 98 F (36.7 C), temperature source Oral, resp. rate 18,  height 6\' 1"  (1.854 m), weight 130.9 kg (288 lb 9.6 oz), SpO2 100 %. Awake, more talkative today, but disoriented to place and time. Reorients quickly and carries on appropriate conversation. MAEW. PEARL. Good strength. No pretibial edema. No dyspnea with conversation. O2 in use. Drsg discolored; changed &incision cleansed with saline. New honeycomb drsg to site. No erythema, swelling, or drainage.  Pt doesn't remember being offered CPAP last night. Nurses unaware of CPAP or status of Respiratory Therapy visit. Nurses report no abnormalities overnight, stating pt had no issues.  Alert, conversant, in good spirits. Family present. Pt acknowledges confusion and forgetfulness this morning, recalling our conversation. Presently he demonstrates no confusion, oriented x4. Pain is well controlled at present. Pretibial edema +1 bilaterally, pt's baseline. Strength is good BLE, but he requires SBA of 1 or 2.  Ok per DrStern to d/c to SNF. Pt and family verbalize understanding of pts need to use CPAP every night.             Disposition D?C to SNF. 1 month office follow up woith Xrays. Rx's for Oxycodone 5mg  and Robaxin 500mg  printed for prn use. CPAP nightly.       Allergies as of 01/22/2017      Reactions   Other Rash   CHG soap and wipes      Medication List    STOP taking these medications   OSTEO BI-FLEX REGULAR STRENGTH PO     TAKE these medications   atenolol 50 MG tablet Commonly known as:  TENORMIN Take 1.5 tablets (75 mg total) by mouth daily. What changed:  how much to take   Bee Pollen 580 MG Caps Take 3 capsules by mouth daily.   furosemide 40 MG tablet Commonly known as:  LASIX Take 40mg  (1 tablet) in the AM and 20mg  (1/2 tablet) in the evening. What  changed:    how much to take  how to take this  when to take this  additional instructions   HYDROcodone-acetaminophen 5-325 MG tablet Commonly known as:  NORCO/VICODIN Take 1 tablet by mouth every 6  (six) hours as needed for pain.   latanoprost 0.005 % ophthalmic solution Commonly known as:  XALATAN Place 1 drop into both eyes at bedtime.   lisinopril 30 MG tablet Commonly known as:  PRINIVIL,ZESTRIL Take 1 tablet (30 mg total) by mouth daily.   methocarbamol 750 MG tablet Commonly known as:  ROBAXIN Take 0.5 tablets (375 mg total) by mouth every 6 (six) hours as needed for muscle spasms.   methylPREDNISolone 4 MG tablet Commonly known as:  MEDROL Take 4 mg by mouth daily.   oxyCODONE 5 MG immediate release tablet Commonly known as:  Oxy IR/ROXICODONE Take 1-2 tablets (5-10 mg total) by mouth every 4 (four) hours as needed for moderate pain or severe pain ((score 4 to 6)).   pyridOXINE 100 MG tablet Commonly known as:  VITAMIN B-6 Take 100 mg by mouth daily.   rosuvastatin 10 MG tablet Commonly known as:  CRESTOR Take 1 tablet (10 mg total) daily by mouth.   vitamin B-12 1000 MCG tablet Commonly known as:  CYANOCOBALAMIN Take 1,000 mcg by mouth daily.        Signed: Peggyann Shoals, MD 01/22/2017, 1:33 PM

## 2017-01-22 NOTE — Progress Notes (Addendum)
Patient ID: Joseph Bryan, male   DOB: 06-22-1940, 77 y.o.   MRN: [id number] Alert, conversant, in good spirits. Family present. Pt acknowledges confusion and forgetfulness this morning, recalling our conversation. Presently he demonstrates no confusion, oriented x4. Pain is well controlled at present. Pretibial edema  +1 bilaterally, pt's baseline. Strength is good BLE, but he requires SBA of 1 or 2.  Ok per DrStern to d/c to SNF. Pt and family verbalize understanding of pts need to use CPAP every night.   I reviewed situation and plan with patient and his family.  They understand the importance of wearing his CPAP and promise to do so.

## 2017-01-22 NOTE — Progress Notes (Signed)
Pt discharged from hospital per orders from MD. Pt and family educated and aware of discharge. RN called and gave report to SNF. Pt's IV was removed prior to discharge. Pt exited hospital via stretcher.

## 2017-01-22 NOTE — Progress Notes (Addendum)
Subjective: Patient reports "They tell me I'm at [facility], but I woke up knowing I hadn't seen this view before" "They said my wife was here every day, but I haven't seen her...or I don't remember"  Objective: Vital signs in last 24 hours: Temp:  [98.5 F (36.9 C)-98.9 F (37.2 C)] 98.6 F (37 C) (01/10 0555) Pulse Rate:  [65-115] 70 (01/10 0555) Resp:  [15-18] 18 (01/10 0555) BP: (89-112)/(39-57) 111/52 (01/10 0555) SpO2:  [94 %-100 %] 99 % (01/10 0555)  Intake/Output from previous day: 01/09 0701 - 01/10 0700 In: -  Out: 700 [Urine:700] Intake/Output this shift: Total I/O In: -  Out: 300 [Urine:300]  Awake, more talkative today, but disoriented to place and time. Reorients quickly and carries on appropriate conversation. MAEW. PEARL. Good strength. No pretibial edema. No dyspnea with conversation. O2 in use.  Drsg discolored; changed & incision cleansed with saline. New honeycomb drsg to site. No erythema, swelling, or drainage.  Pt doesn't remember being offered CPAP last night. Nurses unaware of CPAP or status of Respiratory Therapy visit. Nurses report no abnormalities overnight, stating pt had no issues.   Lab Results: Recent Labs    01/20/17 1127  WBC 14.4*  HGB 10.7*  HCT 33.7*  PLT 140*   BMET Recent Labs    01/20/17 1127  NA 132*  K 4.4  CL 97*  CO2 25  GLUCOSE 118*  BUN 26*  CREATININE 1.05  CALCIUM 8.6*    Studies/Results: Ct Head Wo Contrast  Result Date: 01/21/2017 CLINICAL DATA:  Altered level of consciousness. Change in cognition. EXAM: CT HEAD WITHOUT CONTRAST TECHNIQUE: Contiguous axial images were obtained from the base of the skull through the vertex without intravenous contrast. COMPARISON:  None. FINDINGS: Brain: Generalized cerebral atrophy, normal for age. Moderate chronic small vessel ischemia. 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 No intracranial hemorrhage, mass effect, or midline shift. No hydrocephalus. The  basilar cisterns are patent. No evidence of territorial infarct or acute ischemia. No extra-axial or intracranial fluid collection. Vascular: Atherosclerosis of skullbase vasculature without hyperdense vessel or abnormal calcification. Skull: No fracture or focal lesion. Sinuses/Orbits: Near complete opacification of the left maxillary sinus is likely chronic with associated sinus thickening and sclerosis. Opacification of few inferior right mastoid air cells of uncertain acuity. Visualized orbits are unremarkable. Other: None. IMPRESSION: 1. Atrophy and chronic small vessel ischemia.  No acute abnormality. 2. Chronic left maxillary sinusitis. Electronically Signed   By: Jeb Levering M.D.   On: 01/21/2017 03:50    Assessment/Plan:   LOS: 6 days  Mobilize this AM, monitoring cognitive status, monitoring blood pressures.    Verdis Prime 01/22/2017, 7:40 AM   Patient not getting CPAP despite order for this.  I suspect this is a significant contributor to the patient's nocturnal confusion and disorientation.  We attempted to convey the importance of this to the nursing staff.

## 2017-01-22 NOTE — Progress Notes (Signed)
Physical Therapy Treatment Patient Details Name: Joseph Bryan MRN: [id number] DOB: [birth year] Today's Date: 01/22/2017    History of Present Illness Pt is a 77 y.o. male s/p PLIF. PMH includes MI, HTN, CKD, CAD, glaucoma, ca.    PT Comments    Patient progressing well towards PT goals. Seems to be back to cognitive baseline per family. A&Ox4 and joking appropriately. Requires less assist for bed mobility and transfers today. Improved ambulation distance and only required 1 seated rest break. Sp02 remained >92% on 2L/min 02. Able to assist with donning brace. Reviewed back precautions. Will continue to follow and progress as tolerated.     Follow Up Recommendations  SNF     Equipment Recommendations  None recommended by PT    Recommendations for Other Services       Precautions / Restrictions Precautions Precautions: Back Precaution Booklet Issued: No Precaution Comments: requires max cues to recall precautions  Required Braces or Orthoses: Spinal Brace Spinal Brace: Lumbar corset;Applied in sitting position Restrictions Weight Bearing Restrictions: No    Mobility  Bed Mobility Overal bed mobility: Needs Assistance Bed Mobility: Rolling;Sidelying to Sit Rolling: Min guard       Sit to sidelying: Min assist;HOB elevated General bed mobility comments: step by step cues provided as well as assist to guide pt with trunk to get to EOB. no dizziness.  Transfers Overall transfer level: Needs assistance Equipment used: Rolling walker (2 wheeled) Transfers: Sit to/from Stand Sit to Stand: Min assist;+2 safety/equipment         General transfer comment: assist to move into standing from EOB x1, from chair x1. Cues for upright. Transferred to chair post ambulation.  Ambulation/Gait Ambulation/Gait assistance: Min guard;+2 safety/equipment Ambulation Distance (Feet): 120 Feet(x2 bouts) Assistive device: Rolling walker (2 wheeled) Gait Pattern/deviations: Step-through  pattern;Decreased stride length;Trunk flexed Gait velocity: slow   General Gait Details: Slow, mostly steady gait with cues for upright posture. Sp02 remained >92% on 2L/min 02. 2/4 DOE. 1 seated rest break.    Stairs            Wheelchair Mobility    Modified Rankin (Stroke Patients Only)       Balance Overall balance assessment: Needs assistance Sitting-balance support: Feet supported;No upper extremity supported Sitting balance-Leahy Scale: Fair     Standing balance support: During functional activity;Bilateral upper extremity supported Standing balance-Leahy Scale: Poor Standing balance comment: heavy reliance on RW                            Cognition Arousal/Alertness: Awake/alert Behavior During Therapy: WFL for tasks assessed/performed Overall Cognitive Status: Within Functional Limits for tasks assessed                                 General Comments: Pt conversive and joking appropriately today. Per wife and family, back to baseline. A&Ox4.      Exercises      General Comments General comments (skin integrity, edema, etc.): Wife, daughter, granddaughter present during session.      Pertinent Vitals/Pain Pain Assessment: 0-10 Pain Score: 2  Pain Location: back Pain Descriptors / Indicators: Aching;Operative site guarding Pain Intervention(s): Monitored during session    Home Living                      Prior Function  PT Goals (current goals can now be found in the care plan section) Progress towards PT goals: Progressing toward goals    Frequency    Min 5X/week      PT Plan Current plan remains appropriate    Co-evaluation              AM-PAC PT "6 Clicks" Daily Activity  Outcome Measure  Difficulty turning over in bed (including adjusting bedclothes, sheets and blankets)?: A Little Difficulty moving from lying on back to sitting on the side of the bed? : Unable Difficulty  sitting down on and standing up from a chair with arms (e.g., wheelchair, bedside commode, etc,.)?: Unable Help needed moving to and from a bed to chair (including a wheelchair)?: A Little Help needed walking in hospital room?: A Little Help needed climbing 3-5 steps with a railing? : A Lot 6 Click Score: 13    End of Session Equipment Utilized During Treatment: Gait belt;Back brace Activity Tolerance: Patient tolerated treatment well Patient left: in chair;with call bell/phone within reach;with family/visitor present Nurse Communication: Mobility status PT Visit Diagnosis: Muscle weakness (generalized) (M62.81);Other abnormalities of gait and mobility (R26.89);Pain Pain - part of body: (back)     Time: 2080-2233 PT Time Calculation (min) (ACUTE ONLY): 27 min  Charges:  $Gait Training: 8-22 mins $Therapeutic Activity: 8-22 mins                    G Codes:       Wray Kearns, PT, DPT [phone]     Marguarite Arbour A Kareem Cathey 01/22/2017, 2:41 PM

## 2017-04-03 ENCOUNTER — Encounter: Payer: Self-pay | Admitting: Cardiovascular Disease

## 2017-04-03 ENCOUNTER — Ambulatory Visit: Payer: Medicare Other | Admitting: Cardiovascular Disease

## 2017-04-03 VITALS — BP 144/64 | HR 86 | Ht 73.0 in | Wt 281.6 lb

## 2017-04-03 DIAGNOSIS — E668 Other obesity: Secondary | ICD-10-CM | POA: Diagnosis not present

## 2017-04-03 DIAGNOSIS — I10 Essential (primary) hypertension: Secondary | ICD-10-CM | POA: Diagnosis not present

## 2017-04-03 DIAGNOSIS — R600 Localized edema: Secondary | ICD-10-CM

## 2017-04-03 DIAGNOSIS — E785 Hyperlipidemia, unspecified: Secondary | ICD-10-CM

## 2017-04-03 DIAGNOSIS — I2583 Coronary atherosclerosis due to lipid rich plaque: Secondary | ICD-10-CM

## 2017-04-03 DIAGNOSIS — G629 Polyneuropathy, unspecified: Secondary | ICD-10-CM | POA: Diagnosis not present

## 2017-04-03 DIAGNOSIS — I4510 Unspecified right bundle-branch block: Secondary | ICD-10-CM | POA: Diagnosis not present

## 2017-04-03 DIAGNOSIS — Z9989 Dependence on other enabling machines and devices: Secondary | ICD-10-CM

## 2017-04-03 DIAGNOSIS — G4733 Obstructive sleep apnea (adult) (pediatric): Secondary | ICD-10-CM | POA: Diagnosis not present

## 2017-04-03 DIAGNOSIS — I2510 Atherosclerotic heart disease of native coronary artery without angina pectoris: Secondary | ICD-10-CM | POA: Diagnosis not present

## 2017-04-03 MED ORDER — TORSEMIDE 20 MG PO TABS: 40.0000 mg | ORAL_TABLET | Freq: Two times a day (BID) | ORAL | 6 refills | Status: DC

## 2017-04-03 MED ORDER — GABAPENTIN 300 MG PO CAPS: 300.0000 mg | ORAL_CAPSULE | Freq: Two times a day (BID) | ORAL | 6 refills | Status: DC

## 2017-04-03 NOTE — Patient Instructions (Addendum)
medication instruction  Stop taking furosemide   Start  Torsemide   FOR THE FIRST 2 DAY S TAKE  40 MG ( 2 TABLETS ) TWICE A DAY , THEN REDUCE TO TAKING 40 MG IN THE MORNING AND 20 MG IN THE LATE AFTERNOON.   IF SWELLING DOES NOT RESOLVE WITH TAKING MEDICATION 40 MG ANG 20 MG THEN START TAKING 40 MG TWICE A DAY.  START  GABAPENTIN 300 MG TWICE A DAY  TALK WITH  NEURO SURGEON IF  MEDICATION NEEDS TO INCREASE.     Your physician recommends that you schedule a follow-up appointment in: MAY 22 ,2019 AT 2:40  PM  WITH DR Claiborne Billings

## 2017-04-03 NOTE — Progress Notes (Signed)
Patient ID: Joseph Bryan, male   DOB: 09/20/1940, 77 y.o.   MRN: [id number]     Primary M.D.: Dr. Claris Gower  HPI: Joseph Bryan is a 77 y.o. male who presents to the office today for a 7 month followup cardiology evaluation.  [name] has CAD and suffered an anterior wall myocardial infarction in November 1998. He underwent primary PTCA of the totally occluded proximal LAD done by me. Due to the concomitant high-grade distal left main stenosis with ostial encroachment to a circumflex vessel following stabilization from his initial intervention and significant myocardial salvage he underwent elective CABG surgery (LIMA to LAD, vein to diagonal, vein to third marginal vessel). He did develop postoperative DVT for which he did require Coumadin therapy. His last catheterization in 2010 showed patent grafts. He has a history of hyperlipidemia also peripheral neuropathy. Two years ago he was admitted to [facility] hospital with abdominal pain and I saw him for preoperative preoperative evaluation.  A preoperative 2-D echo Doppler study showed normal systolic function with mild/moderate tricuspid regurgitation. Mild pulmonary hypertension with a PA pressure 33 mm. He was found to have colonic ischemia with presumed appendix inflammation.  From a cardiac standpoint, he ultimately tolerated his abdominal surgery where he was found to have colonic ischemia and underwent laparoscopic ileo-colectomy by Dr. [name].   A nuclear perfusion which on 11/16/2012 revealed a perfusion defect in the mid to distal anteroapical, apical, inferoapical walls consistent with distal LAD territory scar. Ejection fraction was 54% there was mild anteroapical hypokinesis. This was not significantly changed from his last nuclear study in 2010.  Additional problems include  a history of sleep apnea and uses CPAP therapy 100% of the time. uses [employer] for his DME company.  He admits to 100% compliance.  He denies any  awareness of breakthrough snoring.  He denies any hypersomnolence.  His sleep is restorative.  He denies chest pain, PND orthopnea.  He denies presyncope.  He had been taking low-dose aspirin 2 tablets daily.  I recommended reduction to 81 mg.  He is unaware of any bleeding.  He does admit to low back discomfort.  He is been found on CT imaging to have bulging disks.  He has seen Dr. [name] for neurosurgical evaluation has undergone epidural injections.  When I saw him in February 2018 his atenolol dose had been reduced by his primary physician from 100 mg down to 50 mg.  He was on atenolol 50 mg and lisinopril 20 mg as well as furosemide 40 mg for hypertension.  He denies any significant edema.  He was tachycardic and I titrated atenolol up to 75 mg.  He continued to be on simvastatin for hyperlipidemia.  He has obstructive sleep apnea and admitted to 100% compliance with CPAP therapy.  Prescription was written for new supplies with his DME company of [employer].   I last saw him in September 2018 at which time he was without chest pain shortness of breath or palpitations.  A nuclear study on August 20, 2016 showed an EF of 36% with moderate scar apically without associated ischemia.  There was a suggestion of possible mild transient ischemic dilation.  He continued to be asymptomatic.  Since I last saw him, he underwent low back surgery with Dr. [name].  He was placed on oxycodone for pain discomfort following surgery.  He has not liked the mental aspects of this drug.  He tells me he did on one day  take his wife's gabapentin and noticed improvement in his peripheral neuropathy and leg discomfort and was questioning about possibly using this instead.  He admits to significant lower extremity edema bilaterally.  He denies recurrent chest pain PND orthopnea.  He continues to use CPAP with 100% compliance.  He presents for reevaluation.  Past Medical History:  Diagnosis Date  . 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 Coronary artery disease   . Glaucoma   . Heart disease   . Hyperlipidemia   . Hypertension   . Liver disease    pt denies  . Lung disease    pt denies  . Myocardial infarction ([place]) 17 yrs ago  . Neuropathy    feet  . Sleep apnea with use of continuous positive airway pressure (CPAP)    uses CPAP    Past Surgical History:  Procedure Laterality Date  . APPENDECTOMY  2014  . COLON RESECTION N/A 08/27/2012   Procedure: Diagnostic laparoscopy; Ileocecectomy;  Surgeon: Madilyn Hook, DO;  Location: [facility] ORS;  Service: General;  Laterality: N/A;  . COLONOSCOPY WITH PROPOFOL N/A 04/09/2015   Procedure: COLONOSCOPY WITH PROPOFOL;  Surgeon: Garlan Fair, MD;  Location: [facility] END[facility]COPY;  Service: Endoscopy;  Laterality: N/A;  . CORONARY ARTERY BYPASS GRAFT  17 yrs ago   x 5  . LUMBAR LAMINECTOMY/DECOMPRESSION MICRODISCECTOMY N/A 10/19/2015   Procedure: Lumbar three to four LAMINECTOMY/FORAMINOTOMY;  Surgeon: Erline Levine, MD;  Location: [place];  Service: Neurosurgery;  Laterality: N/A;  . LUNG SURGERY  yrs ago   right and left lungs - "had air pockets"    Allergies  Allergen Reactions  . Other Rash    CHG soap and wipes    Current Outpatient Medications  Medication Sig Dispense Refill  . atenolol (TENORMIN) 50 MG tablet Take 1.5 tablets (75 mg total) by mouth daily. (Patient taking differently: Take 50 mg by mouth daily. ) 45 tablet 6  . Bee Pollen 580 MG CAPS Take 3 capsules by mouth daily.     Marland Kitchen HYDROcodone-acetaminophen (NORCO/VICODIN) 5-325 MG tablet Take 1 tablet by mouth every 6 (six) hours as needed for pain.    Marland Kitchen latanoprost (XALATAN) 0.005 % ophthalmic solution Place 1 drop into both eyes at bedtime.    Marland Kitchen lisinopril (PRINIVIL,ZESTRIL) 30 MG tablet Take 1 tablet (30 mg total) by mouth daily. 90 tablet 3  . methocarbamol (ROBAXIN) 750 MG tablet Take 0.5 tablets (375 mg total) by  mouth every 6 (six) hours as needed for muscle spasms. 60 tablet 1  . methylPREDNISolone (MEDROL) 4 MG tablet Take 4 mg by mouth daily.    Marland Kitchen pyridOXINE (VITAMIN B-6) 100 MG tablet Take 100 mg by mouth daily.    . rosuvastatin (CRESTOR) 10 MG tablet Take 10 mg by mouth daily.    . vitamin B-12 (CYANOCOBALAMIN) 1000 MCG tablet Take 1,000 mcg by mouth daily.    Marland Kitchen gabapentin (NEURONTIN) 300 MG capsule Take 1 capsule (300 mg total) by mouth 2 (two) times daily. 60 capsule 6  . torsemide (DEMADEX) 20 MG tablet Take 2 tablets (40 mg total) by mouth 2 (two) times daily. And as directed 120 tablet 6   No current facility-administered medications for this visit.     Social History   Socioeconomic History  . Marital status: Married    Spouse name: Not on file  . Number of children: Not  on file  . Years of education: Not on file  . Highest education level: Not on file  Occupational History  . Not on file  Social Needs  . Financial resource strain: Not on file  . Food insecurity:    Worry: Not on file    Inability: Not on file  . Transportation needs:    Medical: Not on file    Non-medical: Not on file  Tobacco Use  . Smoking status: Former Smoker    Types: Cigarettes    Last attempt to quit: 08/25/1988    Years since quitting: 28.6  . Smokeless tobacco: Never Used  Substance and Sexual Activity  . Alcohol use: No    Alcohol/week: 0.0 oz  . Drug use: No  . Sexual activity: Not on file  Lifestyle  . Physical activity:    Days per week: Not on file    Minutes per session: Not on file  . Stress: Not on file  Relationships  . Social connections:    Talks on phone: Not on file    Gets together: Not on file    Attends religious service: Not on file    Active member of club or organization: Not on file    Attends meetings of clubs or organizations: Not on file    Relationship status: Not on file  . Intimate partner violence:    Fear of current or ex partner: Not on file     Emotionally abused: Not on file    Physically abused: Not on file    Forced sexual activity: Not on file  Other Topics Concern  . Not on file  Social History Narrative  . Not on file    Family History  Problem Relation Age of Onset  . Hypertension Father   . Hypertension Sister   . Heart attack Brother    He is married has 3 children 4 grandchildren 2 great-grandchildren. There is remote tobacco history.  ROS General: Negative; No fevers, chills, or night sweats;  HEENT: Negative; No changes in vision or hearing, sinus congestion, difficulty swallowing Pulmonary: Negative; No cough, wheezing, shortness of breath, hemoptysis Cardiovascular: Negative; No chest pain, presyncope, syncope, palpitations Positive for leg swelling GI: Negative; No nausea, vomiting, diarrhea, or abdominal pain GU: Negative; No dysuria, hematuria, or difficulty voiding Musculoskeletal: Positive for low back pain and herniated disks; he walks with a cane status post recent surgery on L3-4 and L4-5;  Hematologic/Oncology: Negative; no easy bruising, bleeding Endocrine: Negative; no heat/cold intolerance; no diabetes Neuro: Negative; no changes in balance, headaches Skin: Negative; No rashes or skin lesions Psychiatric: Negative; No behavioral problems, depression Sleep: Positive for obstructive sleep apnea on CPAP therapy.  No snoring, daytime sleepiness, hypersomnolence, bruxism, restless legs, hypnogognic hallucinations, no cataplexy Other comprehensive 14 point system review is negative.   PE BP (!) 144/64   Pulse 86   Ht '6\' 1"'  (1.854 m)   Wt 281 lb 9.6 oz (127.7 kg)   BMI 37.15 kg/m    Repeat blood pressure by me was 140/66  Wt Readings from Last 3 Encounters:  04/03/17 281 lb 9.6 oz (127.7 kg)  01/16/17 288 lb 9.6 oz (130.9 kg)  01/15/17 288 lb 9.6 oz (130.9 kg)   General: Alert, oriented, no distress.  Skin: normal turgor, no rashes, warm and dry HEENT: Normocephalic, atraumatic. Pupils  equal round and reactive to light; sclera anicteric; extraocular muscles intact;  Nose without nasal septal hypertrophy Mouth/Parynx benign; Mallinpatti scale 3 Neck: No JVD,  no carotid bruits; normal carotid upstroke Lungs: clear to ausculatation and percussion; no wheezing or rales Chest wall: without tenderness to palpitation Heart: PMI not displaced, RRR, s1 s2 normal, 1/6 systolic murmur, no diastolic murmur, no rubs, gallops, thrills, or heaves Abdomen: soft, nontender; no hepatosplenomehaly, BS+; abdominal aorta nontender and not dilated by palpation. Back: no CVA tenderness Pulses 2+ Musculoskeletal: full range of motion, normal strength, no joint deformities Extremities: 3+ tense lower extremity bilateral edema to his knees.  No clubbing cyanosis , Homan's sign negative  Neurologic: grossly nonfocal; Cranial nerves grossly wnl Psychologic: Normal mood and affect   ECG (independently read by me): sinus rhythm at 86 bpm with occasional PACs. 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  QTc interval 473 ms  September 2018 ECG (independently read by me): Normal sinus rhythm at 65 bpm.  Right bundle-branch block with repolarization changes.  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  LVH by voltage.  Normal intervals.  No ectopy.  February 2018 ECG (independently read by me): Sinus tachycardia at 102 bpm, right bundle branch block, left anterior hemiblock.  LVH.  February 2017 ECG (independently read by me): Normal sinus rhythm at 63 bpm.  Right bundle-branch block with repolarization changes.  Left axis deviation.  Normal intervals.  December 2015 ECG (independently read by me): Sinus bradycardia 56 bpm.  Right bundle branch block with repolarization changes.  Normal intervals.  December 2014 ECG: Sinus rhythm at 67 beats per minute with right bundle branch block and nonspecific ST-T changes. Qtc 464 msec  LABS: BMP Latest Ref Rng &  Units 01/20/2017 01/12/2017 10/28/2016  Glucose 65 - 99 mg/dL 118(H) 92 93  BUN 6 - 20 mg/dL 26(H) 12 11  Creatinine 0.61 - 1.24 mg/dL 1.05 0.89 1.04  BUN/Creat Ratio 10 - 24 - - 11  Sodium 135 - 145 mmol/L 132(L) 135 140  Potassium 3.5 - 5.1 mmol/L 4.4 4.3 5.0  Chloride 101 - 111 mmol/L 97(L) 101 98  CO2 22 - 32 mmol/L '25 26 27  ' Calcium 8.9 - 10.3 mg/dL 8.6(L) 9.5 9.4   Hepatic Function Latest Ref Rng & Units 01/12/2017 10/28/2016 04/17/2016  Total Protein 6.5 - 8.1 g/dL 7.0 7.4 7.0  Albumin 3.5 - 5.0 g/dL 3.6 3.9 3.6  AST 15 - 41 U/L 68(H) 47(H) 45(H)  ALT 17 - 63 U/L 85(H) 41 36  Alk Phosphatase 38 - 126 U/L 170(H) 180(H) 160(H)  Total Bilirubin 0.3 - 1.2 mg/dL 0.8 0.6 0.6  Bilirubin, Direct <=0.2 mg/dL - - 0.2   CBC Latest Ref Rng & Units 01/20/2017 01/12/2017 10/28/2016  WBC 4.0 - 10.5 K/uL 14.4(H) 10.0 7.6  Hemoglobin 13.0 - 17.0 g/dL 10.7(L) 13.5 14.0  Hematocrit 39.0 - 52.0 % 33.7(L) 42.9 42.5  Platelets 150 - 400 K/uL 140(L) 163 199   Lab Results  Component Value Date   TSH 5.430 (H) 10/28/2016   Lab Results  Component Value Date   MCV 94.7 01/20/2017   MCV 96.6 01/12/2017   MCV 91 10/28/2016   Lipid Panel     Component Value Date/Time   CHOL 144 10/28/2016 1155   TRIG 87 10/28/2016 1155   HDL 64 10/28/2016 1155   CHOLHDL 2.3 10/28/2016 1155   CHOLHDL 2.1 04/17/2016 1112   VLDL 18 04/17/2016 1112   LDLCALC 63 10/28/2016 1155     RADIOLOGY: No results found.  IMPRESSION:  1. Bilateral leg edema   2. Coronary artery disease due to lipid rich  plaque   3. Essential hypertension   4. OSA on CPAP   5. Hyperlipidemia with target LDL less than 70   6. Neuropathy   7. RBBB   8. Moderate obesity       ASSESSMENT AND PLAN: [name] is a 77 year old white male who is 21 years status post an anterior wall myocardial infarction treated with PTCA of a totally occluded LAD in 1998. Due to severe  left main stenosis he underwent CABG surgery with a LIMA to his  LAD, vein to diagonal, and vein to the third marginal vessel. He has been documented have significant salvage of myocardium from his acute PTCA. An echo Doppler study  done preoperatively showed a normal  ejection fraction  and he did have mild to moderate TR.  A nuclear perfusion study in 2014 shows distal LAD territory scar concordant with his prior myocardial infarction. Ejection fraction was 54%. There was no region of ischemia. This was unchanged from his previous study in 2010.  When I last year in 2018, he was tachycardic after his primary physician had reduced his atenolol and I further titrated this to 75 mg daily.  I referred him for a nuclear stress test, which now demonstrate an EF of 36% with scar and raised the possibility of mild transient ischemic dilation.  I personally reviewed the myocardial perfusion images.  An echo Doppler study October 17, 2016 however showed an EF of 45-50% and there was diffuse hypokinesis with grade 1 diastolic dysfunction.  There was mild aortic insufficiency.  His left atrium was mild to moderately dilated.  There was moderate tricuspid regurgitation.  Peak PA pressure was mildly elevated at 35 mm presently, heHg.  Has 3+ tense lower extremity edema bilaterally.  He has been taking atenolol 75 mg daily, furosemide 40 mg in the morning and 20 mg in the late afternoon, and lisinopril 30 mg daily.  Blood pressure is mildly increased.  I am recommending he discontinue furosemide and due to his significant edema he will change to torsemide and for the first 2 days take 40 mg twice a day.  If he notices significant reduction in edema he can try reducing torsemide to 40 mg in the morning and 20 mg in the evening.  However he was instructed that if edema recurs to resume the 40 twice daily dosing.  I have recommended that he discontinue the oxycodone.  I will give him a prescription for Neurontin 300 mg twice a day for his peripheral neuropathy and lower discomfort.  He will be  following up with his primary MD.  Repeat laboratory will need to be obtained.  He continues to use CPAP with 100% compliance.  He denies breakthrough snoring.  He is moderately obese with a BMI of 37.  Weight loss was recommended.  He continues to be on rosuvastatin 10 mg for hyperlipidemia.  LDL on October 28, 2016 was excellent at 3.  I will see him in 2 months for reevaluation.   Time spent: 30 minutes Troy Sine, MD, [facility]  04/05/2017 11:17 AM

## 2017-04-05 ENCOUNTER — Encounter: Payer: Self-pay | Admitting: Cardiovascular Disease

## 2017-05-30 ENCOUNTER — Other Ambulatory Visit: Payer: Self-pay | Admitting: Cardiovascular Disease

## 2017-06-01 NOTE — Telephone Encounter (Signed)
Rx has been sent to the pharmacy electronically. ° °

## 2017-06-03 ENCOUNTER — Ambulatory Visit: Payer: Medicare Other | Admitting: Cardiovascular Disease

## 2017-06-03 ENCOUNTER — Encounter: Payer: Self-pay | Admitting: Cardiovascular Disease

## 2017-06-03 VITALS — BP 128/80 | Ht 73.0 in | Wt 278.0 lb

## 2017-06-03 DIAGNOSIS — E785 Hyperlipidemia, unspecified: Secondary | ICD-10-CM | POA: Diagnosis not present

## 2017-06-03 DIAGNOSIS — I10 Essential (primary) hypertension: Secondary | ICD-10-CM | POA: Diagnosis not present

## 2017-06-03 DIAGNOSIS — E669 Obesity, unspecified: Secondary | ICD-10-CM

## 2017-06-03 DIAGNOSIS — I2583 Coronary atherosclerosis due to lipid rich plaque: Secondary | ICD-10-CM

## 2017-06-03 DIAGNOSIS — I2510 Atherosclerotic heart disease of native coronary artery without angina pectoris: Secondary | ICD-10-CM

## 2017-06-03 DIAGNOSIS — G4733 Obstructive sleep apnea (adult) (pediatric): Secondary | ICD-10-CM

## 2017-06-03 DIAGNOSIS — E668 Other obesity: Secondary | ICD-10-CM | POA: Diagnosis not present

## 2017-06-03 DIAGNOSIS — Z79899 Other long term (current) drug therapy: Secondary | ICD-10-CM | POA: Diagnosis not present

## 2017-06-03 DIAGNOSIS — Z9989 Dependence on other enabling machines and devices: Secondary | ICD-10-CM

## 2017-06-03 DIAGNOSIS — R600 Localized edema: Secondary | ICD-10-CM

## 2017-06-03 MED ORDER — POTASSIUM CHLORIDE CRYS ER 20 MEQ PO TBCR: EXTENDED_RELEASE_TABLET | ORAL | 3 refills | Status: DC

## 2017-06-03 MED ORDER — METOLAZONE 2.5 MG PO TABS: ORAL_TABLET | ORAL | 3 refills | Status: DC

## 2017-06-03 NOTE — Progress Notes (Signed)
Patient ID: Joseph Bryan, male   DOB: August 27, 1940, 77 y.o.   MRN: [id number]     Primary M.D.: Dr. Claris Gower  HPI: [name] is a 77 y.o. male who presents to the office today for a 2 month followup cardiology evaluation.  [name] has CAD and suffered an anterior wall myocardial infarction in November 1998. He underwent primary PTCA of the totally occluded proximal LAD done by me. Due to the concomitant high-grade distal left main stenosis with ostial encroachment to a circumflex vessel following stabilization from his initial intervention and significant myocardial salvage he underwent elective CABG surgery (LIMA to LAD, vein to diagonal, vein to third marginal vessel). He did develop postoperative DVT for which he did require Coumadin therapy. His last catheterization in 2010 showed patent grafts. He has a history of hyperlipidemia also peripheral neuropathy. Two years ago he was admitted to [facility] with abdominal pain and I saw him for preoperative preoperative evaluation.  A preoperative 2-D echo Doppler study showed normal systolic function with mild/moderate tricuspid regurgitation. Mild pulmonary hypertension with a PA pressure 33 mm. He was found to have colonic ischemia with presumed appendix inflammation.  From a cardiac standpoint, he ultimately tolerated his abdominal surgery where he was found to have colonic ischemia and underwent laparoscopic ileo-colectomy by Dr. [name].   A nuclear perfusion which on 11/16/2012 revealed a perfusion defect in the mid to distal anteroapical, apical, inferoapical walls consistent with distal LAD territory scar. Ejection fraction was 54% there was mild anteroapical hypokinesis. This was not significantly changed from his last nuclear study in 2010.  Additional problems include  a history of sleep apnea and uses CPAP therapy 100% of the time. uses [employer] for his DME company.  He admits to 100% compliance.  He denies any  awareness of breakthrough snoring.  He denies any hypersomnolence.  His sleep is restorative.  He denies chest pain, PND orthopnea.  He denies presyncope.  He had been taking low-dose aspirin 2 tablets daily.  I recommended reduction to 81 mg.  He is unaware of any bleeding.  He does admit to low back discomfort.  He is been found on CT imaging to have bulging disks.  He has seen Dr. [name] for neurosurgical evaluation has undergone epidural injections.  When I saw him in February 2018 his atenolol dose had been reduced by his primary physician from 100 mg down to 50 mg.  He was on atenolol 50 mg and lisinopril 20 mg as well as furosemide 40 mg for hypertension.  He denies any significant edema.  He was tachycardic and I titrated atenolol up to 75 mg.  He continued to be on simvastatin for hyperlipidemia.  He has obstructive sleep apnea and admitted to 100% compliance with CPAP therapy.  Prescription was written for new supplies with his DME company of [employer].   When I saw him in September 2018 at which time he was without chest pain shortness of breath or palpitations.  A nuclear study on August 20, 2016 showed an EF of 36% with moderate scar apically without associated ischemia.  There was a suggestion of possible mild transient ischemic dilation.  He continued to be asymptomatic.  He underwent low back surgery with Dr. [name].  He was placed on oxycodone for pain discomfort following surgery.  He has not liked the mental aspects of this drug.  He tells me he did on one day take his wife's gabapentin and  noticed improvement in his peripheral neuropathy and leg discomfort and was questioning about possibly using this instead.  He admits to significant lower extremity edema bilaterally.  He denies recurrent chest pain PND orthopnea.  He continues to use CPAP with 100% compliance.  I last saw him in March 2019.  At that time, he had 3+ tense lower extremity edema and his blood pressure was mildly  increased.  He was taking atenolol 75 mg daily, furosemide 40 mg in the morning and 20 mg in the late afternoon in addition to lisinopril 30 mg daily.  I discontinued furosemide and changed him to torsemide 40 mg twice a day for several days but then dose reduction to 40 mg in the morning and 20 mg in the late afternoon but he could increase this back to 40 twice daily with recurrent edema.  He has only noticed minimal benefit with the leg swelling.  Typically in the morning he hardly has any edema but as the day progresses his edema significantly increases.  He denies any chest pain.  He continues to use CPAP with 100% compliance.  Since I saw him he underwent cataract and glaucoma surgery last week.  He presents for reevaluation.  Past Medical History:  Diagnosis Date  . 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 Coronary artery disease   . Glaucoma   . Heart disease   . Hyperlipidemia   . Hypertension   . Liver disease    pt denies  . Lung disease    pt denies  . Myocardial infarction ([place]) 17 yrs ago  . Neuropathy    feet  . Sleep apnea with use of continuous positive airway pressure (CPAP)    uses CPAP    Past Surgical History:  Procedure Laterality Date  . APPENDECTOMY  2014  . COLON RESECTION N/A 08/27/2012   Procedure: Diagnostic laparoscopy; Ileocecectomy;  Surgeon: Madilyn Hook, DO;  Location: [facility];  Service: General;  Laterality: N/A;  . COLONOSCOPY WITH PROPOFOL N/A 04/09/2015   Procedure: COLONOSCOPY WITH PROPOFOL;  Surgeon: Garlan Fair, MD;  Location: [facility];  Service: Endoscopy;  Laterality: N/A;  . CORONARY ARTERY BYPASS GRAFT  17 yrs ago   x 5  . LUMBAR LAMINECTOMY/DECOMPRESSION MICRODISCECTOMY N/A 10/19/2015   Procedure: Lumbar three to four LAMINECTOMY/FORAMINOTOMY;  Surgeon: Erline Levine, MD;  Location: [place];  Service: Neurosurgery;  Laterality: N/A;  . LUNG SURGERY  yrs ago    right and left lungs - "had air pockets"    Allergies  Allergen Reactions  . Other Rash    CHG soap and wipes    Current Outpatient Medications  Medication Sig Dispense Refill  . atenolol (TENORMIN) 50 MG tablet Take 50 mg by mouth daily.    Raelyn Ensign Pollen 580 MG CAPS Take 3 capsules by mouth daily.     Marland Kitchen gabapentin (NEURONTIN) 300 MG capsule Take 1 capsule (300 mg total) by mouth 2 (two) times daily. 60 capsule 6  . HYDROcodone-acetaminophen (NORCO/VICODIN) 5-325 MG tablet Take 1 tablet by mouth every 6 (six) hours as needed for pain.    Marland Kitchen latanoprost (XALATAN) 0.005 % ophthalmic solution Place 1 drop into both eyes at bedtime.    Marland Kitchen lisinopril (PRINIVIL,ZESTRIL) 30 MG tablet Take 1 tablet (30 mg total) by mouth daily. 90 tablet 3  . methocarbamol (ROBAXIN) 750 MG tablet Take 0.5 tablets (375 mg  total) by mouth every 6 (six) hours as needed for muscle spasms. 60 tablet 1  . methylPREDNISolone (MEDROL) 4 MG tablet Take 4 mg by mouth daily.    Marland Kitchen pyridOXINE (VITAMIN B-6) 100 MG tablet Take 100 mg by mouth daily.    . rosuvastatin (CRESTOR) 20 MG tablet TAKE 1 TABLET BY MOUTH DAILY 90 tablet 2  . torsemide (DEMADEX) 20 MG tablet Take 2 tablets (40 mg total) by mouth 2 (two) times daily. And as directed 120 tablet 6  . vitamin B-12 (CYANOCOBALAMIN) 1000 MCG tablet Take 1,000 mcg by mouth daily.    . metolazone (ZAROXOLYN) 2.5 MG tablet Take as directed (20-30 mins prior to morning dose of torsemide) 30 tablet 3  . potassium chloride SA (K-DUR,KLOR-CON) 20 MEQ tablet Take 1 tablet by mouth when you take a metolazone 30 tablet 3   No current facility-administered medications for this visit.     Social History   Socioeconomic History  . Marital status: Married    Spouse name: Not on file  . Number of children: Not on file  . Years of education: Not on file  . Highest education level: Not on file  Occupational History  . Not on file  Social Needs  . Financial resource strain: Not on file   . Food insecurity:    Worry: Not on file    Inability: Not on file  . Transportation needs:    Medical: Not on file    Non-medical: Not on file  Tobacco Use  . Smoking status: Former Smoker    Types: Cigarettes    Last attempt to quit: 08/25/1988    Years since quitting: 28.7  . Smokeless tobacco: Never Used  Substance and Sexual Activity  . Alcohol use: No    Alcohol/week: 0.0 oz  . Drug use: No  . Sexual activity: Not on file  Lifestyle  . Physical activity:    Days per week: Not on file    Minutes per session: Not on file  . Stress: Not on file  Relationships  . Social connections:    Talks on phone: Not on file    Gets together: Not on file    Attends religious service: Not on file    Active member of club or organization: Not on file    Attends meetings of clubs or organizations: Not on file    Relationship status: Not on file  . Intimate partner violence:    Fear of current or ex partner: Not on file    Emotionally abused: Not on file    Physically abused: Not on file    Forced sexual activity: Not on file  Other Topics Concern  . Not on file  Social History Narrative  . Not on file    Family History  Problem Relation Age of Onset  . Hypertension Father   . Hypertension Sister   . Heart attack Brother    He is married has 3 children 4 grandchildren 2 great-grandchildren. There is remote tobacco history.  ROS General: Negative; No fevers, chills, or night sweats;  HEENT:  positive for cataract/glaucoma surgery, no sinus congestion, difficulty swallowing Pulmonary: Negative; No cough, wheezing, shortness of breath, hemoptysis Cardiovascular: Negative; No chest pain, presyncope, syncope, palpitations Positive for leg swelling GI: Negative; No nausea, vomiting, diarrhea, or abdominal pain GU: Negative; No dysuria, hematuria, or difficulty voiding Musculoskeletal: Positive for low back pain and herniated disks; he walks with a cane status post recent surgery  on L3-4  and L4-5;  Hematologic/Oncology: Negative; no easy bruising, bleeding Endocrine: Negative; no heat/cold intolerance; no diabetes Neuro: Negative; no changes in balance, headaches Skin: Negative; No rashes or skin lesions Psychiatric: Negative; No behavioral problems, depression Sleep: Positive for obstructive sleep apnea on CPAP therapy.  No snoring, daytime sleepiness, hypersomnolence, bruxism, restless legs, hypnogognic hallucinations, no cataplexy Other comprehensive 14 point system review is negative.   PE BP 128/80   Ht 6' 1" (1.854 m)   Wt 278 lb (126.1 kg)   BMI 36.68 kg/m    Repeat blood pressure 130/80  Wt Readings from Last 3 Encounters:  06/03/17 278 lb (126.1 kg)  04/03/17 281 lb 9.6 oz (127.7 kg)  01/16/17 288 lb 9.6 oz (130.9 kg)   General: Alert, oriented, no distress.  Skin: normal turgor, no rashes, warm and dry HEENT: Normocephalic, atraumatic. Pupils equal round and reactive to light; sclera anicteric; extraocular muscles intact; Nose without nasal septal hypertrophy Mouth/Parynx benign; Mallinpatti scale 3 Neck: No JVD, no carotid bruits; normal carotid upstroke Lungs: clear to ausculatation and percussion; no wheezing or rales Chest wall: without tenderness to palpitation Heart: PMI not displaced, RRR, s1 s2 normal, 1/6 systolic murmur, no diastolic murmur, no rubs, gallops, thrills, or heaves Abdomen: soft, nontender; no hepatosplenomehaly, BS+; abdominal aorta nontender and not dilated by palpation. Back: no CVA tenderness Pulses 2+ Musculoskeletal: full range of motion, normal strength, no joint deformities Extremities: Improved but still 2+;  no clubbing cyanosis, Homan's sign negative  Neurologic: grossly nonfocal; Cranial nerves grossly wnl Psychologic: Normal mood and affect   March 2019 ECG (independently read by me): sinus rhythm at 86 bpm with occasional PACs. 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  QTc interval 473 ms  September 2018 ECG (independently read by me): Normal sinus rhythm at 65 bpm.  Right bundle-branch block with repolarization changes.  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  LVH by voltage.  Normal intervals.  No ectopy.  February 2018 ECG (independently read by me): Sinus tachycardia at 102 bpm, right bundle branch block, left anterior hemiblock.  LVH.  February 2017 ECG (independently read by me): Normal sinus rhythm at 63 bpm.  Right bundle-branch block with repolarization changes.  Left axis deviation.  Normal intervals.  December 2015 ECG (independently read by me): Sinus bradycardia 56 bpm.  Right bundle branch block with repolarization changes.  Normal intervals.  December 2014 ECG: Sinus rhythm at 67 beats per minute with right bundle branch block and nonspecific ST-T changes. Qtc 464 msec  LABS: BMP Latest Ref Rng & Units 06/03/2017 01/20/2017 01/12/2017  Glucose 65 - 99 mg/dL 94 118(H) 92  BUN 8 - 27 mg/dL 16 26(H) 12  Creatinine 0.76 - 1.27 mg/dL 1.01 1.05 0.89  BUN/Creat Ratio 10 - 24 16 - -  Sodium 134 - 144 mmol/L 142 132(L) 135  Potassium 3.5 - 5.2 mmol/L 4.2 4.4 4.3  Chloride 96 - 106 mmol/L 100 97(L) 101  CO2 20 - 29 mmol/L _0 Calcium 8.6 - 10.2 mg/dL 9.2 8.6(L) 9.5   Hepatic Function Latest Ref Rng & Units 06/03/2017 01/12/2017 10/28/2016  Total Protein 6.0 - 8.5 g/dL 7.6 7.0 7.4  Albumin 3.5 - 4.8 g/dL 3.8 3.6 3.9  AST 0 - 40 IU/L 66(H) 68(H) 47(H)  ALT 0 - 44 IU/L 51(H) 85(H) 41  Alk Phosphatase 39 - 117 IU/L 288(H) 170(H) 180(H)  Total Bilirubin 0.0 - 1.2 mg/dL 0.5 0.8 0.6  Bilirubin, Direct <=0.2  mg/dL - - -   CBC Latest Ref Rng & Units 01/20/2017 01/12/2017 10/28/2016  WBC 4.0 - 10.5 K/uL 14.4(H) 10.0 7.6  Hemoglobin 13.0 - 17.0 g/dL 10.7(L) 13.5 14.0  Hematocrit 39.0 - 52.0 % 33.7(L) 42.9 42.5  Platelets 150 - 400 K/uL 140(L) 163 199   Lab Results  Component Value Date   TSH 5.430 (H) 10/28/2016   Lab  Results  Component Value Date   MCV 94.7 01/20/2017   MCV 96.6 01/12/2017   MCV 91 10/28/2016   Lipid Panel     Component Value Date/Time   CHOL 144 10/28/2016 1155   TRIG 87 10/28/2016 1155   HDL 64 10/28/2016 1155   CHOLHDL 2.3 10/28/2016 1155   CHOLHDL 2.1 04/17/2016 1112   VLDL 18 04/17/2016 1112   LDLCALC 63 10/28/2016 1155     RADIOLOGY: No results found.  IMPRESSION:  1. Bilateral leg edema   2. Medication management   3. Essential hypertension   4. Coronary artery disease due to lipid rich plaque   5. OSA on CPAP   6. Hyperlipidemia with target LDL less than 70   7. Moderate obesity       ASSESSMENT AND PLAN: [name] is a 77 year old white male who is 21 years status post an anterior wall myocardial infarction treated with PTCA of a totally occluded LAD in 1998. Due to severe  left main stenosis he underwent CABG surgery with a LIMA to his LAD, vein to diagonal, and vein to the third marginal vessel. He has been documented have significant salvage of myocardium from his acute PTCA. An echo Doppler study done preoperatively showed a normal  ejection fraction with mild to moderate TR.  A nuclear perfusion study in 2014 shows distal LAD territory scar concordant with his prior myocardial infarction. Ejection fraction was 54%. There was no region of ischemia. This was unchanged from his previous study in 2010.  When I last year in 2018, he was tachycardic after his primary physician had reduced his atenolol and I further titrated this to 75 mg daily.  His last nuclear study revealed an EF of 36% with scar and raised the possibility of mild transient ischemic dilation.  I personally reviewed the myocardial perfusion images.  An echo Doppler study October 17, 2016 however showed an EF of 45-50% and there was diffuse hypokinesis with grade 1 diastolic dysfunction.  There was mild aortic insufficiency.  His left atrium was mild to moderately dilated.  There was moderate tricuspid  regurgitation.  Peak PA pressure was mildly elevated at 35 mm presently.  When I last saw him he had 3+ tense edema.  This has slightly improved such that in the early morning his edema has almost resolved but by the end of the day it is at least 2+ and tense.  He has been taking torsemide 40 mg twice a day in addition to lisinopril 30 mg and atenolol 50 mg daily.  I have recommended metolazone 2.5 mg 30 minutes prior to his morning torsemide dose for the next 2 days and then he will change this to every other day for 3 doses and then every third day or possibly once a week depending upon leg edema.  When he takes metolazone he will take an potassium supplement 20 mEq.  I will check a Cmet to reassess renal function today and a follow-up bmet next week.  He is not having any chest pain or anginal type symptoms.  His LDL cholesterol when  checked in October was excellent on his current dose of rosuvastatin 20 mg.  I will see him in 4 weeks for reevaluation.  Time spent: 30 minutes Troy Sine, MD, [facility]  06/04/2017 7:30 PM

## 2017-06-03 NOTE — Patient Instructions (Signed)
Medication Instructions:  Metolazone 2.5 mg --take 1 tablet daily for 2 days (Thursday and Friday)  (20-30 mins prior to your morning dose of torsemide)  --then take 1 tablet every other day for 3 doses (20-30 mins prior to your morning dose of torsemide) --then take 1 tablet once weekly or as needed (20 -30 mins prior to morning dose of torsemide)  Potassium 20 meq --take a dose of potassium every time you take a metolazone  Labwork: Today-CMET   One week- BMET  Our in office lab hours are Monday-Friday 8:00-4:00, closed for lunch 12:45-1:45 pm.  No appointment needed.  Follow-Up: 4 weeks with Dr. Claiborne Billings  Any Other Special Instructions Will Be Listed Below (If Applicable).     If you need a refill on your cardiac medications before your next appointment, please call your pharmacy.

## 2017-06-04 ENCOUNTER — Encounter: Payer: Self-pay | Admitting: Cardiovascular Disease

## 2017-06-04 LAB — COMPREHENSIVE METABOLIC PANEL
A/G RATIO: 1 — AB (ref 1.2–2.2)
ALBUMIN: 3.8 g/dL (ref 3.5–4.8)
ALT: 51 IU/L — ABNORMAL HIGH (ref 0–44)
AST: 66 IU/L — AB (ref 0–40)
Alkaline Phosphatase: 288 IU/L — ABNORMAL HIGH (ref 39–117)
BUN/Creatinine Ratio: 16 (ref 10–24)
BUN: 16 mg/dL (ref 8–27)
Bilirubin Total: 0.5 mg/dL (ref 0.0–1.2)
CALCIUM: 9.2 mg/dL (ref 8.6–10.2)
CO2: 25 mmol/L (ref 20–29)
CREATININE: 1.01 mg/dL (ref 0.76–1.27)
Chloride: 100 mmol/L (ref 96–106)
GFR calc Af Amer: 83 mL/min/{1.73_m2} (ref >59)
GFR, EST NON AFRICAN AMERICAN: 72 mL/min/{1.73_m2} (ref >59)
GLOBULIN, TOTAL: 3.8 g/dL (ref 1.5–4.5)
Glucose: 94 mg/dL (ref 65–99)
POTASSIUM: 4.2 mmol/L (ref 3.5–5.2)
SODIUM: 142 mmol/L (ref 134–144)
Total Protein: 7.6 g/dL (ref 6.0–8.5)

## 2017-06-09 ENCOUNTER — Telehealth: Payer: Self-pay | Admitting: Cardiovascular Disease

## 2017-06-09 DIAGNOSIS — Z79899 Other long term (current) drug therapy: Secondary | ICD-10-CM

## 2017-06-09 DIAGNOSIS — E785 Hyperlipidemia, unspecified: Secondary | ICD-10-CM

## 2017-06-09 MED ORDER — ROSUVASTATIN CALCIUM 20 MG PO TABS: 10.0000 mg | ORAL_TABLET | Freq: Every day | ORAL | 2 refills | Status: DC

## 2017-06-09 NOTE — Telephone Encounter (Signed)
Notes recorded by Troy Sine, MD on 06/04/2017 at 7:33 PM EDT Renal function stable. Mild LFT elevation. Increased alkaline phosphatase. Reduce rosuvastatin to 10 mg. Recheck LFTs and lipid panel in 4 to 6 weeks.   Patient aware and verbalized understanding.  Labs reordered and mailed to patient.   Patient reports new issue with hands and feet "jerking" when he gets up in the AM.  States this started yesterday and continued today.   States his leg gives out when walking.   No weakness on one side, no difficulty speaking or thinking, no facial droop, patient speaking clear in full sentences.  States he called PCP yesterday and was instructed to call EMS but he didn't.   Advised to call PCP back since symptoms have continued.   If symptoms increase or worsen call EMS or go to ER for evaluation.  Patient aware and verbalized understanding.

## 2017-06-09 NOTE — Telephone Encounter (Signed)
New message ° ° °Please call with lab results °

## 2017-06-15 ENCOUNTER — Other Ambulatory Visit: Payer: Self-pay | Admitting: Cardiovascular Disease

## 2017-06-16 NOTE — Telephone Encounter (Signed)
Follow up     *STAT* If patient is at the pharmacy, call can be transferred to refill team.   1. Which medications need to be refilled? (please list name of each medication and dose if known) atenolol (TENORMIN) 50 MG tablet  2. Which pharmacy/location (including street and city if local pharmacy) is medication to be sent to? [facility] [place].  3. Do they need a 30 day or 90 day supply? [place]

## 2017-06-22 ENCOUNTER — Encounter: Payer: Self-pay | Admitting: Cardiovascular Disease

## 2017-06-22 ENCOUNTER — Ambulatory Visit: Payer: Medicare Other | Admitting: Cardiovascular Disease

## 2017-06-22 VITALS — BP 126/66 | HR 60 | Ht 73.0 in | Wt 267.0 lb

## 2017-06-22 DIAGNOSIS — I2583 Coronary atherosclerosis due to lipid rich plaque: Secondary | ICD-10-CM

## 2017-06-22 DIAGNOSIS — R600 Localized edema: Secondary | ICD-10-CM

## 2017-06-22 DIAGNOSIS — I10 Essential (primary) hypertension: Secondary | ICD-10-CM

## 2017-06-22 DIAGNOSIS — Z9989 Dependence on other enabling machines and devices: Secondary | ICD-10-CM | POA: Diagnosis not present

## 2017-06-22 DIAGNOSIS — G4733 Obstructive sleep apnea (adult) (pediatric): Secondary | ICD-10-CM

## 2017-06-22 DIAGNOSIS — R945 Abnormal results of liver function studies: Secondary | ICD-10-CM | POA: Diagnosis not present

## 2017-06-22 DIAGNOSIS — I2510 Atherosclerotic heart disease of native coronary artery without angina pectoris: Secondary | ICD-10-CM

## 2017-06-22 DIAGNOSIS — R7989 Other specified abnormal findings of blood chemistry: Secondary | ICD-10-CM

## 2017-06-22 MED ORDER — ROSUVASTATIN CALCIUM 20 MG PO TABS: 10.0000 mg | ORAL_TABLET | Freq: Every day | ORAL | 3 refills | Status: DC

## 2017-06-22 MED ORDER — LISINOPRIL 20 MG PO TABS: 20.0000 mg | ORAL_TABLET | Freq: Every day | ORAL | 3 refills | Status: DC

## 2017-06-22 MED ORDER — ATENOLOL 50 MG PO TABS: 75.0000 mg | ORAL_TABLET | Freq: Every day | ORAL | 3 refills | Status: DC

## 2017-06-22 MED ORDER — TORSEMIDE 20 MG PO TABS: 40.0000 mg | ORAL_TABLET | Freq: Two times a day (BID) | ORAL | 3 refills | Status: DC

## 2017-06-22 MED ORDER — METOLAZONE 2.5 MG PO TABS: ORAL_TABLET | ORAL | 3 refills | Status: DC

## 2017-06-22 MED ORDER — GABAPENTIN 300 MG PO CAPS: 300.0000 mg | ORAL_CAPSULE | Freq: Two times a day (BID) | ORAL | 3 refills | Status: DC

## 2017-06-22 NOTE — Patient Instructions (Signed)
Medication Instructions:  Decrease lisinopril to 20 mg daily  Take metolazone 2.5 mg once weekly (take tomorrow) --30 mins prior to AM dose of torsemide --if you have increased swelling after 2-3 weeks, ok to increase to 2 times weekly  Follow-Up: 2 months with Dr. Claiborne Billings    If you need a refill on your cardiac medications before your next appointment, please call your pharmacy.

## 2017-06-22 NOTE — Progress Notes (Signed)
Patient ID: Joseph Bryan, male   DOB: Sep 11, 1940, 77 y.o.   MRN: [id number]     Primary M.D.: Dr. Claris Gower  HPI: Joseph Bryan is a 77 y.o. male who presents to the office today for a 1 month followup cardiology evaluation.  Joseph Bryan has CAD and suffered an anterior wall myocardial infarction in November 1998. He underwent primary PTCA of the totally occluded proximal LAD done by me. Due to the concomitant high-grade distal left main stenosis with ostial encroachment to a circumflex vessel following stabilization from his initial intervention and significant myocardial salvage he underwent elective CABG surgery (LIMA to LAD, vein to diagonal, vein to third marginal vessel). He did develop postoperative DVT for which he did require Coumadin therapy. His last catheterization in 2010 showed patent grafts. He has a history of hyperlipidemia also peripheral neuropathy. Two years ago he was admitted to [facility] long hospital with abdominal pain and I saw him for preoperative preoperative evaluation.  A preoperative 2-D echo Doppler study showed normal systolic function with mild/moderate tricuspid regurgitation. Mild pulmonary hypertension with a PA pressure 33 mm. He was found to have colonic ischemia with presumed appendix inflammation.  From a cardiac standpoint, he ultimately tolerated his abdominal surgery where he was found to have colonic ischemia and underwent laparoscopic ileo-colectomy by Dr. [name].   A nuclear perfusion which on 11/16/2012 revealed a perfusion defect in the mid to distal anteroapical, apical, inferoapical walls consistent with distal LAD territory scar. Ejection fraction was 54% there was mild anteroapical hypokinesis. This was not significantly changed from his last nuclear study in 2010.  Additional problems include  a history of sleep apnea and uses CPAP therapy 100% of the time. uses [employer] for his DME company.  He admits to 100% compliance.  He denies any  awareness of breakthrough snoring.  He denies any hypersomnolence.  His sleep is restorative.  He denies chest pain, PND orthopnea.  He denies presyncope.  He had been taking low-dose aspirin 2 tablets daily.  I recommended reduction to 81 mg.  He is unaware of any bleeding.  He does admit to low back discomfort.  He is been found on CT imaging to have bulging disks.  He has seen Dr. [name] for neurosurgical evaluation has undergone epidural injections.  When I saw him in February 2018 his atenolol dose had been reduced by his primary physician from 100 mg down to 50 mg.  He was on atenolol 50 mg and lisinopril 20 mg as well as furosemide 40 mg for hypertension.  He denies any significant edema.  He was tachycardic and I titrated atenolol up to 75 mg.  He continued to be on simvastatin for hyperlipidemia.  He has obstructive sleep apnea and admitted to 100% compliance with CPAP therapy.  Prescription was written for new supplies with his DME company of [employer].   When I saw him in September 2018 at which time he was without chest pain shortness of breath or palpitations.  A nuclear study on August 20, 2016 showed an EF of 36% with moderate scar apically without associated ischemia.  There was a suggestion of possible mild transient ischemic dilation.  He continued to be asymptomatic.  He underwent low back surgery with Dr. [name].  He was placed on oxycodone for pain discomfort following surgery.  He has not liked the mental aspects of this drug.  He tells me he did on one day take his wife's gabapentin and  noticed improvement in his peripheral neuropathy and leg discomfort and was questioning about possibly using this instead.  He admits to significant lower extremity edema bilaterally.  He denies recurrent chest pain PND orthopnea.  He continues to use CPAP with 100% compliance.  I saw him in March 2019.  At that time, he had 3+ tense lower extremity edema and his blood pressure was mildly  increased.  He was taking atenolol 75 mg daily, furosemide 40 mg in the morning and 20 mg in the late afternoon in addition to lisinopril 30 mg daily.  I discontinued furosemide and changed him to torsemide 40 mg twice a day for several days but then dose reduction to 40 mg in the morning and 20 mg in the late afternoon but he could increase this back to 40 twice daily with recurrent edema.  He has only noticed minimal benefit with the leg swelling.  Typically in the morning he hardly has any edema but as the day progresses his edema significantly increases.  He denies any chest pain.  He continues to use CPAP with 100% compliance.    When I last saw him in May 2019 he continued to have at least 2+ tense edema as the day progressed.  I recommended metolazone 2.5 mg 30 minutes prior to his morning dose of torsemide for 2 days and then recommended changing this to every other day and then every third day depending upon his leg edema.  This did improve his edema but apparently his blood pressure decreased and he stopped taking this after only several doses.  He has lost 11 pounds since that evaluation.  Denies chest pain or palpitations.  He presents for reevaluation.  Past Medical History:  Diagnosis Date  . 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 Coronary artery disease   . Glaucoma   . Heart disease   . Hyperlipidemia   . Hypertension   . Liver disease    pt denies  . Lung disease    pt denies  . Myocardial infarction ([place]) 17 yrs ago  . Neuropathy    feet  . Sleep apnea with use of continuous positive airway pressure (CPAP)    uses CPAP    Past Surgical History:  Procedure Laterality Date  . APPENDECTOMY  2014  . COLON RESECTION N/A 08/27/2012   Procedure: Diagnostic laparoscopy; Ileocecectomy;  Surgeon: Madilyn Hook, DO;  Location: [facility] ORS;  Service: General;  Laterality: N/A;  . COLONOSCOPY WITH PROPOFOL N/A  04/09/2015   Procedure: COLONOSCOPY WITH PROPOFOL;  Surgeon: Garlan Fair, MD;  Location: [facility] ENDOSCOPY;  Service: Endoscopy;  Laterality: N/A;  . CORONARY ARTERY BYPASS GRAFT  17 yrs ago   x 5  . LUMBAR LAMINECTOMY/DECOMPRESSION MICRODISCECTOMY N/A 10/19/2015   Procedure: Lumbar three to four LAMINECTOMY/FORAMINOTOMY;  Surgeon: Erline Levine, MD;  Location: [facility];  Service: Neurosurgery;  Laterality: N/A;  . LUNG SURGERY  yrs ago   right and left lungs - "had air pockets"    Allergies  Allergen Reactions  . Other Rash    CHG soap and wipes    Current Outpatient Medications  Medication Sig Dispense Refill  . atenolol (TENORMIN) 50 MG tablet Take 1.5 tablets (75 mg total) by mouth daily. 135 tablet 3  . Bee Pollen 580 MG CAPS Take 3 capsules by mouth daily.     Marland Kitchen gabapentin (NEURONTIN) 300 MG  capsule Take 1 capsule (300 mg total) by mouth 2 (two) times daily. 180 capsule 3  . HYDROcodone-acetaminophen (NORCO/VICODIN) 5-325 MG tablet Take 1 tablet by mouth every 6 (six) hours as needed for pain.    Marland Kitchen latanoprost (XALATAN) 0.005 % ophthalmic solution Place 1 drop into both eyes at bedtime.    Marland Kitchen lisinopril (PRINIVIL,ZESTRIL) 20 MG tablet Take 1 tablet (20 mg total) by mouth daily. 90 tablet 3  . methocarbamol (ROBAXIN) 750 MG tablet Take 0.5 tablets (375 mg total) by mouth every 6 (six) hours as needed for muscle spasms. 60 tablet 1  . methylPREDNISolone (MEDROL) 4 MG tablet Take 4 mg by mouth daily.    . metolazone (ZAROXOLYN) 2.5 MG tablet Take as directed (20-30 mins prior to morning dose of torsemide) 30 tablet 3  . potassium chloride SA (K-DUR,KLOR-CON) 20 MEQ tablet Take 1 tablet by mouth when you take a metolazone 30 tablet 3  . pyridOXINE (VITAMIN B-6) 100 MG tablet Take 100 mg by mouth daily.    . rosuvastatin (CRESTOR) 20 MG tablet Take 0.5 tablets (10 mg total) by mouth daily. 45 tablet 3  . vitamin B-12 (CYANOCOBALAMIN) 1000 MCG tablet Take 1,000 mcg by mouth daily.    Marland Kitchen  torsemide (DEMADEX) 20 MG tablet Take 2 tablets (40 mg total) by mouth 2 (two) times daily. And as directed 360 tablet 3   No current facility-administered medications for this visit.     Social History   Socioeconomic History  . Marital status: Married    Spouse name: Not on file  . Number of children: Not on file  . Years of education: Not on file  . Highest education level: Not on file  Occupational History  . Not on file  Social Needs  . Financial resource strain: Not on file  . Food insecurity:    Worry: Not on file    Inability: Not on file  . Transportation needs:    Medical: Not on file    Non-medical: Not on file  Tobacco Use  . Smoking status: Former Smoker    Types: Cigarettes    Last attempt to quit: 08/25/1988    Years since quitting: 28.8  . Smokeless tobacco: Never Used  Substance and Sexual Activity  . Alcohol use: No    Alcohol/week: 0.0 oz  . Drug use: No  . Sexual activity: Not on file  Lifestyle  . Physical activity:    Days per week: Not on file    Minutes per session: Not on file  . Stress: Not on file  Relationships  . Social connections:    Talks on phone: Not on file    Gets together: Not on file    Attends religious service: Not on file    Active member of club or organization: Not on file    Attends meetings of clubs or organizations: Not on file    Relationship status: Not on file  . Intimate partner violence:    Fear of current or ex partner: Not on file    Emotionally abused: Not on file    Physically abused: Not on file    Forced sexual activity: Not on file  Other Topics Concern  . Not on file  Social History Narrative  . Not on file    Family History  Problem Relation Age of Onset  . Hypertension Father   . Hypertension Sister   . Heart attack Brother    He is married has 3 children  4 grandchildren 2 great-grandchildren. There is remote tobacco history.  ROS General: Negative; No fevers, chills, or night sweats;    HEENT:  positive for cataract/glaucoma surgery, no sinus congestion, difficulty swallowing Pulmonary: Negative; No cough, wheezing, shortness of breath, hemoptysis Cardiovascular: Negative; No chest pain, presyncope, syncope, palpitations Positive for leg swelling GI: Negative; No nausea, vomiting, diarrhea, or abdominal pain GU: Negative; No dysuria, hematuria, or difficulty voiding Musculoskeletal: Positive for low back pain and herniated disks; he walks with a cane status post recent surgery on L3-4 and L4-5;  Hematologic/Oncology: Negative; no easy bruising, bleeding Endocrine: Negative; no heat/cold intolerance; no diabetes Neuro: Negative; no changes in balance, headaches Skin: Negative; No rashes or skin lesions Psychiatric: Negative; No behavioral problems, depression Sleep: Positive for obstructive sleep apnea on CPAP therapy.  No snoring, daytime sleepiness, hypersomnolence, bruxism, restless legs, hypnogognic hallucinations, no cataplexy Other comprehensive 14 point system review is negative.   PE BP 126/66   Pulse 60   Ht _0  (1.854 m)   Wt 267 lb (121.1 kg)   BMI 35.23 kg/m    Repeat blood pressure 130/80  Wt Readings from Last 3 Encounters:  06/22/17 267 lb (121.1 kg)  06/03/17 278 lb (126.1 kg)  04/03/17 281 lb 9.6 oz (127.7 kg)   General: Alert, oriented, no distress.  Skin: normal turgor, no rashes, warm and dry HEENT: Normocephalic, atraumatic. Pupils equal round and reactive to light; sclera anicteric; extraocular muscles intact;  Nose without nasal septal hypertrophy Mouth/Parynx benign; Mallinpatti scale 3 Neck: No JVD, no carotid bruits; normal carotid upstroke Lungs: clear to ausculatation and percussion; no wheezing or rales Chest wall: without tenderness to palpitation Heart: PMI not displaced, RRR, s1 s2 normal, 1/6 systolic murmur, no diastolic murmur, no rubs, gallops, thrills, or heaves Abdomen: soft, nontender; no hepatosplenomehaly, BS+;  abdominal aorta nontender and not dilated by palpation. Back: no CVA tenderness Pulses 2+ Musculoskeletal: full range of motion, normal strength, no joint deformities Extremities: 2+ pitting edema bilaterally, less tense; no clubbing cyanosis or edema, Homan's sign negative  Neurologic: grossly nonfocal; Cranial nerves grossly wnl Psychologic: Normal mood and affect  ECG (independently read by me): Normal sinus rhythm at 60 bpm.  Right bundle branch block with repolarization changes.  March 2019 ECG (independently read by me): sinus rhythm at 86 bpm with occasional PACs. 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  QTc interval 473 ms  September 2018 ECG (independently read by me): Normal sinus rhythm at 65 bpm.  Right bundle-branch block with repolarization changes.  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  LVH by voltage.  Normal intervals.  No ectopy.  February 2018 ECG (independently read by me): Sinus tachycardia at 102 bpm, right bundle branch block, left anterior hemiblock.  LVH.  February 2017 ECG (independently read by me): Normal sinus rhythm at 63 bpm.  Right bundle-branch block with repolarization changes.  Left axis deviation.  Normal intervals.  December 2015 ECG (independently read by me): Sinus bradycardia 56 bpm.  Right bundle branch block with repolarization changes.  Normal intervals.  December 2014 ECG: Sinus rhythm at 67 beats per minute with right bundle branch block and nonspecific ST-T changes. Qtc 464 msec  LABS: BMP Latest Ref Rng & Units 06/03/2017 01/20/2017 01/12/2017  Glucose 65 - 99 mg/dL 94 118(H) 92  BUN 8 - 27 mg/dL 16 26(H) 12  Creatinine 0.76 - 1.27 mg/dL 1.01 1.05 0.89  BUN/Creat Ratio 10 - 24 16 - -  Sodium  134 - 144 mmol/L 142 132(L) 135  Potassium 3.5 - 5.2 mmol/L 4.2 4.4 4.3  Chloride 96 - 106 mmol/L 100 97(L) 101  CO2 20 - 29 mmol/L _0 Calcium 8.6 - 10.2 mg/dL 9.2 8.6(L) 9.5   Hepatic  Function Latest Ref Rng & Units 06/03/2017 01/12/2017 10/28/2016  Total Protein 6.0 - 8.5 g/dL 7.6 7.0 7.4  Albumin 3.5 - 4.8 g/dL 3.8 3.6 3.9  AST 0 - 40 IU/L 66(H) 68(H) 47(H)  ALT 0 - 44 IU/L 51(H) 85(H) 41  Alk Phosphatase 39 - 117 IU/L 288(H) 170(H) 180(H)  Total Bilirubin 0.0 - 1.2 mg/dL 0.5 0.8 0.6  Bilirubin, Direct <=0.2 mg/dL - - -   CBC Latest Ref Rng & Units 01/20/2017 01/12/2017 10/28/2016  WBC 4.0 - 10.5 K/uL 14.4(H) 10.0 7.6  Hemoglobin 13.0 - 17.0 g/dL 10.7(L) 13.5 14.0  Hematocrit 39.0 - 52.0 % 33.7(L) 42.9 42.5  Platelets 150 - 400 K/uL 140(L) 163 199   Lab Results  Component Value Date   TSH 5.430 (H) 10/28/2016   Lab Results  Component Value Date   MCV 94.7 01/20/2017   MCV 96.6 01/12/2017   MCV 91 10/28/2016   Lipid Panel     Component Value Date/Time   CHOL 144 10/28/2016 1155   TRIG 87 10/28/2016 1155   HDL 64 10/28/2016 1155   CHOLHDL 2.3 10/28/2016 1155   CHOLHDL 2.1 04/17/2016 1112   VLDL 18 04/17/2016 1112   LDLCALC 63 10/28/2016 1155     RADIOLOGY: No results found.  IMPRESSION:  1. Bilateral leg edema   2. Essential hypertension   3. Coronary artery disease due to lipid rich plaque   4. OSA on CPAP   5. Elevated LFTs       ASSESSMENT AND PLAN: Joseph Bryan is a 77 year old white male who is 21 years status post an anterior wall myocardial infarction treated with PTCA of a totally occluded LAD in 1998. Due to severe  left main stenosis he underwent CABG surgery with a LIMA to his LAD, vein to diagonal, and vein to the third marginal vessel. He has been documented have significant salvage of myocardium from his acute PTCA. An echo Doppler study done preoperatively showed a normal  ejection fraction with mild to moderate TR.  A nuclear perfusion study in 2014 shows distal LAD territory scar concordant with his prior myocardial infarction.   His last nuclear study revealed an EF of 36% with scar and raised the possibility of mild transient  ischemic dilation.  I personally reviewed the myocardial perfusion images.  An echo Doppler study October 17, 2016 however showed an EF of 45-50% and there was diffuse hypokinesis with grade 1 diastolic dysfunction.  There was mild aortic insufficiency.  His left atrium was mild to moderately dilated.  There was moderate tricuspid regurgitation.  Peak PA pressure was mildly elevated at 35 mm presently.  Over the last year he has had issues with progressive significant lower extremity edema.  He has lost 11 pounds as result of diuresis since his evaluation last month.  His blood pressure today is stable on atenolol 75 mg, lisinopril 30 mg, torsemide 40 mg twice a day.  He has not been taking metolazone.  States as the day progresses he continues to experience significant leg swelling.  I again have recommended support stockings with at least 20 to 30 mm of pressure support.  I have suggested he rechallenge and take metolazone 2.5 mg once a week  30 minutes before his morning torsemide dose but he will decrease his lisinopril to 20 mg prior to reinitiating once weekly metolazone.  We discussed leg elevation and sodium restriction.  Reviewed recent laboratory.  Renal function is stable with a creatinine of 1.01.  He had mild transaminase elevation with elevation of alkaline phosphatase.  He continues to be on rosuvastatin at only 10 mg daily.  He continues to use CPAP for his obstructive sleep apnea.  I will see him in 2 months for reevaluation.   Time spent: 25 minutes Troy Sine, MD, [facility]  06/24/2017 7:25 PM

## 2017-06-24 ENCOUNTER — Encounter: Payer: Self-pay | Admitting: Cardiovascular Disease

## 2017-06-24 ENCOUNTER — Other Ambulatory Visit: Payer: Self-pay

## 2017-06-24 MED ORDER — TORSEMIDE 20 MG PO TABS: 40.0000 mg | ORAL_TABLET | Freq: Two times a day (BID) | ORAL | 3 refills | Status: DC

## 2017-06-30 NOTE — Addendum Note (Signed)
Addended by: Therisa Doyne on: 06/30/2017 01:43 PM   Modules accepted: Orders

## 2017-08-17 ENCOUNTER — Ambulatory Visit: Payer: Medicare Other | Admitting: Cardiovascular Disease

## 2017-08-17 ENCOUNTER — Encounter: Payer: Self-pay | Admitting: Cardiovascular Disease

## 2017-08-17 VITALS — BP 152/72 | HR 55 | Ht 72.0 in | Wt 284.8 lb

## 2017-08-17 DIAGNOSIS — I10 Essential (primary) hypertension: Secondary | ICD-10-CM | POA: Diagnosis not present

## 2017-08-17 DIAGNOSIS — I2510 Atherosclerotic heart disease of native coronary artery without angina pectoris: Secondary | ICD-10-CM

## 2017-08-17 DIAGNOSIS — Z9989 Dependence on other enabling machines and devices: Secondary | ICD-10-CM

## 2017-08-17 DIAGNOSIS — I4510 Unspecified right bundle-branch block: Secondary | ICD-10-CM

## 2017-08-17 DIAGNOSIS — Z79899 Other long term (current) drug therapy: Secondary | ICD-10-CM | POA: Diagnosis not present

## 2017-08-17 DIAGNOSIS — R600 Localized edema: Secondary | ICD-10-CM

## 2017-08-17 DIAGNOSIS — I2583 Coronary atherosclerosis due to lipid rich plaque: Secondary | ICD-10-CM | POA: Diagnosis not present

## 2017-08-17 DIAGNOSIS — E668 Other obesity: Secondary | ICD-10-CM

## 2017-08-17 DIAGNOSIS — G4733 Obstructive sleep apnea (adult) (pediatric): Secondary | ICD-10-CM

## 2017-08-17 DIAGNOSIS — R7989 Other specified abnormal findings of blood chemistry: Secondary | ICD-10-CM

## 2017-08-17 DIAGNOSIS — E785 Hyperlipidemia, unspecified: Secondary | ICD-10-CM

## 2017-08-17 DIAGNOSIS — R945 Abnormal results of liver function studies: Secondary | ICD-10-CM

## 2017-08-17 NOTE — Progress Notes (Signed)
Patient ID: Joseph Bryan, male   DOB: June 26, 1940, 77 y.o.   MRN: [id number]     Primary M.D.: Dr. Claris Gower  HPI: Joseph Bryan is a 77 y.o. male who presents to the office today for a 1 month followup cardiology evaluation.  [name] has CAD and suffered an anterior wall myocardial infarction in November 1998. He underwent primary PTCA of the totally occluded proximal LAD done by me. Due to the concomitant high-grade distal left main stenosis with ostial encroachment to a circumflex vessel following stabilization from his initial intervention and significant myocardial salvage he underwent elective CABG surgery (LIMA to LAD, vein to diagonal, vein to third marginal vessel). He did develop postoperative DVT for which he did require Coumadin therapy. His last catheterization in 2010 showed patent grafts. He has a history of hyperlipidemia also peripheral neuropathy. Two years ago he was admitted to [facility] hospital with abdominal pain and I saw him for preoperative preoperative evaluation.  A preoperative 2-D echo Doppler study showed normal systolic function with mild/moderate tricuspid regurgitation. Mild pulmonary hypertension with a PA pressure 33 mm. He was found to have colonic ischemia with presumed appendix inflammation.  From a cardiac standpoint, he ultimately tolerated his abdominal surgery where he was found to have colonic ischemia and underwent laparoscopic ileo-colectomy by Dr. [name].   A nuclear perfusion which on 11/16/2012 revealed a perfusion defect in the mid to distal anteroapical, apical, inferoapical walls consistent with distal LAD territory scar. Ejection fraction was 54% there was mild anteroapical hypokinesis. This was not significantly changed from his last nuclear study in 2010.  Additional problems include  a history of sleep apnea and uses CPAP therapy 100% of the time. uses [employer] for his DME company.  He admits to 100% compliance.  He denies any  awareness of breakthrough snoring.  He denies any hypersomnolence.  His sleep is restorative.  He denies chest pain, PND orthopnea.  He denies presyncope.  He had been taking low-dose aspirin 2 tablets daily.  I recommended reduction to 81 mg.  He is unaware of any bleeding.  He does admit to low back discomfort.  He is been found on CT imaging to have bulging disks.  He has seen Dr. [name] for neurosurgical evaluation has undergone epidural injections.  When I saw him in February 2018 his atenolol dose had been reduced by his primary physician from 100 mg down to 50 mg.  He was on atenolol 50 mg and lisinopril 20 mg as well as furosemide 40 mg for hypertension.  He denies any significant edema.  He was tachycardic and I titrated atenolol up to 75 mg.  He continued to be on simvastatin for hyperlipidemia.  He has obstructive sleep apnea and admitted to 100% compliance with CPAP therapy.  Prescription was written for new supplies with his DME company of [employer].   When I saw him in September 2018 at which time he was without chest pain shortness of breath or palpitations.  A nuclear study on August 20, 2016 showed an EF of 36% with moderate scar apically without associated ischemia.  There was a suggestion of possible mild transient ischemic dilation.  He continued to be asymptomatic.  He underwent low back surgery with Dr. [name].  He was placed on oxycodone for pain discomfort following surgery.  He has not liked the mental aspects of this drug.  He tells me he did on one day take his wife's gabapentin and  noticed improvement in his peripheral neuropathy and leg discomfort and was questioning about possibly using this instead.  He admits to significant lower extremity edema bilaterally.  He denies recurrent chest pain PND orthopnea.  He continues to use CPAP with 100% compliance.  When I saw him in March 2019 he had 3+ tense lower extremity edema and his blood pressure was mildly increased.  He  was taking atenolol 75 mg daily, furosemide 40 mg in the morning and 20 mg in the late afternoon in addition to lisinopril 30 mg daily.  I discontinued furosemide and changed him to torsemide 40 mg twice a day for several days but then dose reduction to 40 mg in the morning and 20 mg in the late afternoon but he could increase this back to 40 twice daily with recurrent edema.  He has only noticed minimal benefit with the leg swelling.  Typically in the morning he hardly has any edema but as the day progresses his edema significantly increases.  He denies any chest pain.  He continues to use CPAP with 100% compliance.    At F/U in May 2019 he continued to have at least 2+ tense edema as the day progressed.  I recommended metolazone 2.5 mg 30 minutes prior to his morning dose of torsemide for 2 days and then recommended changing this to every other day and then every third day depending upon his leg edema.  This did improve his edema but apparently his blood pressure decreased and he stopped taking this after only several doses.  He had lost 11 pounds since that evaluation.    When I saw him in June 2019 he again experienced significant leg swelling.  I recommended support stockings with at least 20-30 Miller pressure support.  I suggested a slight rechallenge of metolazone and recommended 2.5 mg once a week 30 minutes before the morning torsemide dose and at that time he was to decrease lisinopril to 20 mg prior to reinitiating weekly metolazone.  Renal function was stable with a creatinine of 1.01.  He has noticed some improvement with resumption of metolazone but has continued to experience leg swelling.  He denies chest pain.  He denies palpitations.  He presents for reevaluation.  Past Medical History:  Diagnosis Date  . 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 Coronary artery disease   . Glaucoma   . Heart disease   .  Hyperlipidemia   . Hypertension   . Liver disease    pt denies  . Lung disease    pt denies  . Myocardial infarction ([place]) 17 yrs ago  . Neuropathy    feet  . Sleep apnea with use of continuous positive airway pressure (CPAP)    uses CPAP    Past Surgical History:  Procedure Laterality Date  . APPENDECTOMY  2014  . COLON RESECTION N/A 08/27/2012   Procedure: Diagnostic laparoscopy; Ileocecectomy;  Surgeon: Madilyn Hook, DO;  Location: [facility] ORS;  Service: General;  Laterality: N/A;  . COLONOSCOPY WITH PROPOFOL N/A 04/09/2015   Procedure: COLONOSCOPY WITH PROPOFOL;  Surgeon: Garlan Fair, MD;  Location: [facility] ENDOSCOPY;  Service: Endoscopy;  Laterality: N/A;  . CORONARY ARTERY BYPASS GRAFT  17 yrs ago   x 5  . LUMBAR LAMINECTOMY/DECOMPRESSION MICRODISCECTOMY N/A 10/19/2015   Procedure: Lumbar three to four LAMINECTOMY/FORAMINOTOMY;  Surgeon: Erline Levine, MD;  Location: [place];  Service:  Neurosurgery;  Laterality: N/A;  . LUNG SURGERY  yrs ago   right and left lungs - "had air pockets"    Allergies  Allergen Reactions  . Other Rash    CHG soap and wipes    Current Outpatient Medications  Medication Sig Dispense Refill  . atenolol (TENORMIN) 50 MG tablet Take 1.5 tablets (75 mg total) by mouth daily. 135 tablet 3  . Bee Pollen 580 MG CAPS Take 3 capsules by mouth daily.     Marland Kitchen gabapentin (NEURONTIN) 300 MG capsule Take 1 capsule (300 mg total) by mouth 2 (two) times daily. 180 capsule 3  . latanoprost (XALATAN) 0.005 % ophthalmic solution Place 1 drop into both eyes at bedtime.    Marland Kitchen lisinopril (PRINIVIL,ZESTRIL) 20 MG tablet Take 1 tablet (20 mg total) by mouth daily. 90 tablet 3  . methylPREDNISolone (MEDROL) 4 MG tablet Take 4 mg by mouth daily.    . metolazone (ZAROXOLYN) 2.5 MG tablet Take as directed (20-30 mins prior to morning dose of torsemide) 30 tablet 3  . potassium chloride SA (K-DUR,KLOR-CON) 20 MEQ tablet Take 1 tablet by mouth when you take a metolazone 30 tablet 3    . pyridOXINE (VITAMIN B-6) 100 MG tablet Take 100 mg by mouth daily.    . rosuvastatin (CRESTOR) 20 MG tablet Take 0.5 tablets (10 mg total) by mouth daily. 45 tablet 3  . torsemide (DEMADEX) 20 MG tablet Take 2 tablets (40 mg total) by mouth 2 (two) times daily. And as directed 360 tablet 3  . vitamin B-12 (CYANOCOBALAMIN) 1000 MCG tablet Take 1,000 mcg by mouth daily.    Marland Kitchen HYDROcodone-acetaminophen (NORCO/VICODIN) 5-325 MG tablet Take 1 tablet by mouth every 6 (six) hours as needed for pain.    . methocarbamol (ROBAXIN) 750 MG tablet Take 0.5 tablets (375 mg total) by mouth every 6 (six) hours as needed for muscle spasms. (Patient not taking: Reported on 08/17/2017) 60 tablet 1   No current facility-administered medications for this visit.     Social History   Socioeconomic History  . Marital status: Married    Spouse name: Not on file  . Number of children: Not on file  . Years of education: Not on file  . Highest education level: Not on file  Occupational History  . Not on file  Social Needs  . Financial resource strain: Not on file  . Food insecurity:    Worry: Not on file    Inability: Not on file  . Transportation needs:    Medical: Not on file    Non-medical: Not on file  Tobacco Use  . Smoking status: Former Smoker    Types: Cigarettes    Last attempt to quit: 08/25/1988    Years since quitting: 29.0  . Smokeless tobacco: Never Used  Substance and Sexual Activity  . Alcohol use: No    Alcohol/week: 0.0 standard drinks  . Drug use: No  . Sexual activity: Not on file  Lifestyle  . Physical activity:    Days per week: Not on file    Minutes per session: Not on file  . Stress: Not on file  Relationships  . Social connections:    Talks on phone: Not on file    Gets together: Not on file    Attends religious service: Not on file    Active member of club or organization: Not on file    Attends meetings of clubs or organizations: Not on file    Relationship  status:  Not on file  . Intimate partner violence:    Fear of current or ex partner: Not on file    Emotionally abused: Not on file    Physically abused: Not on file    Forced sexual activity: Not on file  Other Topics Concern  . Not on file  Social History Narrative  . Not on file    Family History  Problem Relation Age of Onset  . Hypertension Father   . Hypertension Sister   . Heart attack Brother    He is married has 3 children 4 grandchildren 2 great-grandchildren. There is remote tobacco history.  ROS General: Negative; No fevers, chills, or night sweats;  HEENT:  positive for cataract/glaucoma surgery, no sinus congestion, difficulty swallowing Pulmonary: Negative; No cough, wheezing, shortness of breath, hemoptysis Cardiovascular: Negative; No chest pain, presyncope, syncope, palpitations Positive for leg swelling GI: Negative; No nausea, vomiting, diarrhea, or abdominal pain GU: Negative; No dysuria, hematuria, or difficulty voiding Musculoskeletal: Positive for low back pain and herniated disks; he walks with a cane status post recent surgery on L3-4 and L4-5;  Hematologic/Oncology: Negative; no easy bruising, bleeding Endocrine: Negative; no heat/cold intolerance; no diabetes Neuro: Negative; no changes in balance, headaches Skin: Negative; No rashes or skin lesions Psychiatric: Negative; No behavioral problems, depression Sleep: Positive for obstructive sleep apnea on CPAP therapy.  No snoring, daytime sleepiness, hypersomnolence, bruxism, restless legs, hypnogognic hallucinations, no cataplexy Other comprehensive 14 point system review is negative.   PE BP (!) 152/72 (BP Location: Left Arm, Patient Position: Sitting)   Pulse (!) 55   Ht 6' (1.829 m)   Wt 284 lb 12.8 oz (129.2 kg)   BMI 38.63 kg/m    Repeat blood pressure by me 126/74  Wt Readings from Last 3 Encounters:  08/17/17 284 lb 12.8 oz (129.2 kg)  06/22/17 267 lb (121.1 kg)  06/03/17 278 lb (126.1 kg)     General: Alert, oriented, no distress.  Skin: normal turgor, no rashes, warm and dry HEENT: Normocephalic, atraumatic. Pupils equal round and reactive to light; sclera anicteric; extraocular muscles intact; Nose without nasal septal hypertrophy Mouth/Parynx benign; Mallinpatti scale 3 Neck: No JVD, no carotid bruits; normal carotid upstroke Lungs: clear to ausculatation and percussion; no wheezing or rales Chest wall: without tenderness to palpitation Heart: PMI not displaced, RRR, s1 s2 normal, 1/6 systolic murmur, no diastolic murmur, no rubs, gallops, thrills, or heaves Abdomen: soft, nontender; no hepatosplenomehaly, BS+; abdominal aorta nontender and not dilated by palpation. Back: no CVA tenderness Pulses 2+ Musculoskeletal: full range of motion, normal strength, no joint deformities Extremities: 1-2+ pitting edema bilaterally no clubbing cyanosis, Homan's sign negative  Neurologic: grossly nonfocal; Cranial nerves grossly wnl Psychologic: Normal mood and affect   ECG (independently read by me):  Sinus bradycardia at 55; RBBB, LAHB, normal intervals  June 2019 ECG (independently read by me): Normal sinus rhythm at 60 bpm.  Right bundle branch block with repolarization changes.  March 2019 ECG (independently read by me): sinus rhythm at 86 bpm with occasional PACs. 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  QTc interval 473 ms  September 2018 ECG (independently read by me): Normal sinus rhythm at 65 bpm.  Right bundle-branch block with repolarization changes.  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  LVH by voltage.  Normal intervals.  No ectopy.  February 2018 ECG (independently read by me): Sinus tachycardia at 102 bpm, right bundle branch block, left anterior hemiblock.  LVH.  February 2017 ECG (independently read by me): Normal sinus rhythm at 63 bpm.  Right bundle-branch block with repolarization changes.  Left axis  deviation.  Normal intervals.  December 2015 ECG (independently read by me): Sinus bradycardia 56 bpm.  Right bundle branch block with repolarization changes.  Normal intervals.  December 2014 ECG: Sinus rhythm at 67 beats per minute with right bundle branch block and nonspecific ST-T changes. Qtc 464 msec  LABS: BMP Latest Ref Rng & Units 08/17/2017 06/03/2017 01/20/2017  Glucose 65 - 99 mg/dL 84 94 118(H)  BUN 8 - 27 mg/dL 16 16 26(H)  Creatinine 0.76 - 1.27 mg/dL 1.05 1.01 1.05  BUN/Creat Ratio 10 - _0 -  Sodium 134 - 144 mmol/L 145(H) 142 132(L)  Potassium 3.5 - 5.2 mmol/L 4.9 4.2 4.4  Chloride 96 - 106 mmol/L 102 100 97(L)  CO2 20 - 29 mmol/L _1 Calcium 8.6 - 10.2 mg/dL 9.2 9.2 8.6(L)   Hepatic Function Latest Ref Rng & Units 08/17/2017 06/03/2017 01/12/2017  Total Protein 6.0 - 8.5 g/dL 7.3 7.6 7.0  Albumin 3.5 - 4.8 g/dL 3.7 3.8 3.6  AST 0 - 40 IU/L 52(H) 66(H) 68(H)  ALT 0 - 44 IU/L 39 51(H) 85(H)  Alk Phosphatase 39 - 117 IU/L 240(H) 288(H) 170(H)  Total Bilirubin 0.0 - 1.2 mg/dL 0.4 0.5 0.8  Bilirubin, Direct <=0.2 mg/dL - - -   CBC Latest Ref Rng & Units 08/17/2017 01/20/2017 01/12/2017  WBC 3.4 - 10.8 x10E3/uL 6.6 14.4(H) 10.0  Hemoglobin 13.0 - 17.7 g/dL 12.7(L) 10.7(L) 13.5  Hematocrit 37.5 - 51.0 % 39.4 33.7(L) 42.9  Platelets 150 - 450 x10E3/uL 158 140(L) 163   Lab Results  Component Value Date   TSH 5.430 (H) 10/28/2016   Lab Results  Component Value Date   MCV 93 08/17/2017   MCV 94.7 01/20/2017   MCV 96.6 01/12/2017   Lipid Panel     Component Value Date/Time   CHOL 144 10/28/2016 1155   TRIG 87 10/28/2016 1155   HDL 64 10/28/2016 1155   CHOLHDL 2.3 10/28/2016 1155   CHOLHDL 2.1 04/17/2016 1112   VLDL 18 04/17/2016 1112   LDLCALC 63 10/28/2016 1155     RADIOLOGY: No results found.  IMPRESSION:  1. Coronary artery disease due to lipid rich plaque   2. Medication management   3. Essential hypertension   4. Bilateral leg edema   5.  Elevated LFTs   6. Hyperlipidemia with target LDL less than 70   7. OSA on CPAP   8. Moderate obesity   9. RBBB       ASSESSMENT AND PLAN: [name] is a 77 year old white male who is 21 years status post an anterior wall myocardial infarction treated with PTCA of a totally occluded LAD in 1998. Due to severe  left main stenosis he underwent CABG surgery with a LIMA to his LAD, vein to diagonal, and vein to the third marginal vessel. He has been documented have significant salvage of myocardium from his acute PTCA. An echo Doppler study done preoperatively showed a normal  ejection fraction with mild to moderate TR.  A nuclear perfusion study in 2014 showed distal LAD territory scar concordant with his prior myocardial infarction.   His last nuclear study revealed an EF of 36% with scar and raised the possibility of mild transient ischemic dilation.  I personally reviewed the myocardial perfusion images.  An echo Doppler study October 17, 2016 however  showed an EF of 45-50% and there was diffuse hypokinesis with grade 1 diastolic dysfunction.  There was mild aortic insufficiency.  His left atrium was mild to moderately dilated.  There was moderate tricuspid regurgitation.  Peak PA pressure was mildly elevated at 35 mm presently.  Mr. Louretta Parma has continued to have issues with leg swelling.  Several months ago he was started on metolazone but developed low blood pressure leading to its discontinuance and significant recurrence of increasing edema.  Since I last saw him he is now been taking metolazone 2.5 mg weekly and had on a reduced dose of lisinopril.  His blood pressure today is stable.  I will try increasing metolazone to 2 days/week and he will take this on Monday and Thursdays.  He continues to be on atenolol 75 mg daily, torsemide 40 mg twice a day and the reduced dose of lisinopril at 20 mg.  I again recommended compression stockings which he is not been utilizing.  His weight today is 284 pounds  which is increased from his last visit.  He continues to be on low-dose rosuvastatin 10 mg for hyperlipidemia.  He continues to utilize CPAP for his obstructive sleep apnea.  I will recheck a chemistry profile and CBC.  Remotely he had had elevation of LFTs.  If these are still elevated rosuvastatin may need to be discontinued.  He has chronic right bundle branch block which is stable.  I have suggested an evaluation with our PharmD in 6 weeks with plan for follow-up visit with me in 3 months.  Time spent: 25 minutes Troy Sine, MD, [facility]  08/22/2017 8:09 PM

## 2017-08-17 NOTE — Patient Instructions (Signed)
Medication Instructions:  INCREASE metolazone to Mondays and Thursdays  Labwork: Today (CMET, CBC)  Follow-Up: 6 weeks with pharmacist 3 months with Dr. Claiborne Billings  Any Other Special Instructions Will Be Listed Below (If Applicable).     If you need a refill on your cardiac medications before your next appointment, please call your pharmacy.

## 2017-08-18 LAB — COMPREHENSIVE METABOLIC PANEL
A/G RATIO: 1 — AB (ref 1.2–2.2)
ALT: 39 IU/L (ref 0–44)
AST: 52 IU/L — AB (ref 0–40)
Albumin: 3.7 g/dL (ref 3.5–4.8)
Alkaline Phosphatase: 240 IU/L — ABNORMAL HIGH (ref 39–117)
BUN/Creatinine Ratio: 15 (ref 10–24)
BUN: 16 mg/dL (ref 8–27)
Bilirubin Total: 0.4 mg/dL (ref 0.0–1.2)
CALCIUM: 9.2 mg/dL (ref 8.6–10.2)
CO2: 27 mmol/L (ref 20–29)
Chloride: 102 mmol/L (ref 96–106)
Creatinine, Ser: 1.05 mg/dL (ref 0.76–1.27)
GFR calc Af Amer: 79 mL/min/{1.73_m2} (ref >59)
GFR, EST NON AFRICAN AMERICAN: 68 mL/min/{1.73_m2} (ref >59)
GLUCOSE: 84 mg/dL (ref 65–99)
Globulin, Total: 3.6 g/dL (ref 1.5–4.5)
POTASSIUM: 4.9 mmol/L (ref 3.5–5.2)
Sodium: 145 mmol/L — ABNORMAL HIGH (ref 134–144)
TOTAL PROTEIN: 7.3 g/dL (ref 6.0–8.5)

## 2017-08-18 LAB — CBC
HEMATOCRIT: 39.4 % (ref 37.5–51.0)
HEMOGLOBIN: 12.7 g/dL — AB (ref 13.0–17.7)
MCH: 29.9 pg (ref 26.6–33.0)
MCHC: 32.2 g/dL (ref 31.5–35.7)
MCV: 93 fL (ref 79–97)
Platelets: 158 10*3/uL (ref 150–450)
RBC: 4.25 x10E6/uL (ref 4.14–5.80)
RDW: 15.8 % — AB (ref 12.3–15.4)
WBC: 6.6 10*3/uL (ref 3.4–10.8)

## 2017-08-22 ENCOUNTER — Encounter: Payer: Self-pay | Admitting: Cardiovascular Disease

## 2017-09-22 ENCOUNTER — Encounter

## 2017-09-29 ENCOUNTER — Ambulatory Visit (INDEPENDENT_AMBULATORY_CARE_PROVIDER_SITE_OTHER): Payer: Medicare Other | Admitting: Pharmacist Clinician (PhC)/ Clinical Pharmacy Specialist

## 2017-09-29 DIAGNOSIS — I10 Essential (primary) hypertension: Secondary | ICD-10-CM | POA: Diagnosis not present

## 2017-09-29 NOTE — Patient Instructions (Signed)
Return for a a follow up appointment with Dr. Claiborne Billings in November  Go to the lab sometime in the next 4-6 weeks, fasting, to get cholesterol and liver function test   Your blood pressure today is 124/62  Check your blood pressure at home several times each week and keep record of the readings.  Take your BP meds as follows:   no change to medications  Bring all of your meds, your BP cuff and your record of home blood pressures to your next appointment.  Exercise as you're able, try to walk approximately 30 minutes per day.  Keep salt intake to a minimum, especially watch canned and prepared boxed foods.  Eat more fresh fruits and vegetables and fewer canned items.  Avoid eating in fast food restaurants.    HOW TO TAKE YOUR BLOOD PRESSURE: . Rest 5 minutes before taking your blood pressure. .  Don't smoke or drink caffeinated beverages for at least 30 minutes before. . Take your blood pressure before (not after) you eat. . Sit comfortably with your back supported and both feet on the floor (don't cross your legs). . Elevate your arm to heart level on a table or a desk. . Use the proper sized cuff. It should fit smoothly and snugly around your bare upper arm. There should be enough room to slip a fingertip under the cuff. The bottom edge of the cuff should be 1 inch above the crease of the elbow. . Ideally, take 3 measurements at one sitting and record the average.

## 2017-09-29 NOTE — Assessment & Plan Note (Signed)
Patient with essential hypertension, currently doing well with lisinopril and atenolol.   I have asked that he monitor home readings for the next 2 months.  He is due to see Dr. Claiborne Billings in November.  At that time we can look at his home readings, check his cuff for accuracy, then determine more accurately if an increase in medication is necessary.

## 2017-09-29 NOTE — Progress Notes (Signed)
09/29/2017 Koda Routon Pott Mar 28, 1940 [id number]   HPI:  EMRE STOCK is a 77 y.o. male patient of Dr Claiborne Billings, with a PMH below who presents today for hypertension clinic evaluation.  In addition to hypertension, his medical history is significant for CAD (s/p CABG 1998), RBBB, hyperlipidemia, lower extremity edema, spinal stenosis, OSA (on CPAP) and morbid obesity.  He notes that he has been on the atenolol and lisinopril for about 20 years, since he had his MI.  In the past he has been noted to have elevated liver enzymes, although in the past year they have never been as high as 3x UNL.  He currently takes rosuvastatin 10 mg daily without problem.    Blood Pressure Goal:  130/80  Current Medications:  Atenolol 75 mg qd - started in 1998  Lisinopril 20 mg  - started in 1998  Torsemide 40 mg qd  Family Hx:  Father died at 80, multiple MI's  Mother died at 72 from MI  Brother died at 83 from MI  Sister died from MI in her 35s  1 son died of Leukemia  2 living children with hypertension  Social Hx:  Former smoker, quit 15+ years ago (was smoking 3 ppd - quit cold [place]); no alcohol; coffee and tea are decaf (2-3 cups per week)  Diet:  Eats mostly at home - veggies fresh and canned, good proteins; not into snack foods, sweets  Exercise:  None  Home BP readings:  120/60 average at home, home arm cuff, about 77 years old; did not bring cuff or record of home readings, average is per patient recall  Intolerances:   nkda  Labs:  08/17/17:  Na 145, K 4.9, Glu 84, BUN 16, SCr 1.05 Wt Readings from Last 3 Encounters:  08/17/17 284 lb 12.8 oz (129.2 kg)  06/22/17 267 lb (121.1 kg)  06/03/17 278 lb (126.1 kg)   BP Readings from Last 3 Encounters:  09/29/17 124/62  08/17/17 (!) 152/72  06/22/17 126/66   Pulse Readings from Last 3 Encounters:  09/29/17 70  08/17/17 (!) 55  06/22/17 60    Current Outpatient Medications  Medication Sig Dispense Refill  . atenolol  (TENORMIN) 50 MG tablet Take 1.5 tablets (75 mg total) by mouth daily. 135 tablet 3  . Bee Pollen 580 MG CAPS Take 3 capsules by mouth daily.     Marland Kitchen gabapentin (NEURONTIN) 300 MG capsule Take 1 capsule (300 mg total) by mouth 2 (two) times daily. 180 capsule 3  . HYDROcodone-acetaminophen (NORCO/VICODIN) 5-325 MG tablet Take 1 tablet by mouth every 6 (six) hours as needed for pain.    Marland Kitchen lisinopril (PRINIVIL,ZESTRIL) 20 MG tablet Take 1 tablet (20 mg total) by mouth daily. 90 tablet 3  . metolazone (ZAROXOLYN) 2.5 MG tablet Take as directed (20-30 mins prior to morning dose of torsemide) 30 tablet 3  . potassium chloride SA (K-DUR,KLOR-CON) 20 MEQ tablet Take 1 tablet by mouth when you take a metolazone 30 tablet 3  . pyridOXINE (VITAMIN B-6) 100 MG tablet Take 100 mg by mouth daily.    . rosuvastatin (CRESTOR) 20 MG tablet Take 0.5 tablets (10 mg total) by mouth daily. 45 tablet 3  . torsemide (DEMADEX) 20 MG tablet Take 2 tablets (40 mg total) by mouth 2 (two) times daily. And as directed 360 tablet 3  . vitamin B-12 (CYANOCOBALAMIN) 1000 MCG tablet Take 1,000 mcg by mouth daily.     No current facility-administered medications for  this visit.     Allergies  Allergen Reactions  . Other Rash    CHG soap and wipes    Past Medical History:  Diagnosis Date  . 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 Coronary artery disease   . Glaucoma   . Heart disease   . Hyperlipidemia   . Hypertension   . Liver disease    pt denies  . Lung disease    pt denies  . Myocardial infarction ([place]) 17 yrs ago  . Neuropathy    feet  . Sleep apnea with use of continuous positive airway pressure (CPAP)    uses CPAP    Blood pressure 124/62, pulse 70.  HTN (hypertension) Patient with essential hypertension, currently doing well with lisinopril and atenolol.   I have asked that he monitor home readings for the next 2 months.  He  is due to see Dr. Claiborne Billings in November.  At that time we can look at his home readings, check his cuff for accuracy, then determine more accurately if an increase in medication is necessary.     Tommy Medal PharmD CPP [facility] [address] [place], [place] [address] [phone]

## 2017-11-30 ENCOUNTER — Encounter: Payer: Self-pay | Admitting: Cardiovascular Disease

## 2017-11-30 ENCOUNTER — Ambulatory Visit: Payer: Medicare Other | Admitting: Cardiovascular Disease

## 2017-11-30 VITALS — BP 121/68 | HR 63 | Resp 16 | Ht 73.0 in | Wt 268.0 lb

## 2017-11-30 DIAGNOSIS — Z951 Presence of aortocoronary bypass graft: Secondary | ICD-10-CM

## 2017-11-30 DIAGNOSIS — I2510 Atherosclerotic heart disease of native coronary artery without angina pectoris: Secondary | ICD-10-CM | POA: Diagnosis not present

## 2017-11-30 DIAGNOSIS — I4510 Unspecified right bundle-branch block: Secondary | ICD-10-CM | POA: Diagnosis not present

## 2017-11-30 DIAGNOSIS — I2583 Coronary atherosclerosis due to lipid rich plaque: Secondary | ICD-10-CM

## 2017-11-30 DIAGNOSIS — E668 Other obesity: Secondary | ICD-10-CM

## 2017-11-30 DIAGNOSIS — G4733 Obstructive sleep apnea (adult) (pediatric): Secondary | ICD-10-CM

## 2017-11-30 DIAGNOSIS — R600 Localized edema: Secondary | ICD-10-CM

## 2017-11-30 DIAGNOSIS — R7989 Other specified abnormal findings of blood chemistry: Secondary | ICD-10-CM

## 2017-11-30 DIAGNOSIS — E785 Hyperlipidemia, unspecified: Secondary | ICD-10-CM

## 2017-11-30 DIAGNOSIS — R945 Abnormal results of liver function studies: Secondary | ICD-10-CM

## 2017-11-30 DIAGNOSIS — Z9989 Dependence on other enabling machines and devices: Secondary | ICD-10-CM

## 2017-11-30 NOTE — Progress Notes (Signed)
Patient ID: Joseph Bryan, male   DOB: 15-Mar-1940, 77 y.o.   MRN: [id number]     Primary M.D.: Dr. Claris Gower  HPI: Joseph Bryan is a 77 y.o. male who presents to the office today for a 3 month followup cardiology evaluation.  Joseph Bryan has CAD and suffered an anterior wall myocardial infarction in November 1998. He underwent primary PTCA of the totally occluded proximal LAD done by me. Due to the concomitant high-grade distal left main stenosis with ostial encroachment to a circumflex vessel following stabilization from his initial intervention and significant myocardial salvage he underwent elective CABG surgery (LIMA to LAD, vein to diagonal, vein to third marginal vessel). He did develop postoperative DVT for which he did require Coumadin therapy. His last catheterization in 2010 showed patent grafts. He has a history of hyperlipidemia also peripheral neuropathy. Two years ago he was admitted to [facility] long hospital with abdominal pain and I saw him for preoperative preoperative evaluation.  A preoperative 2-D echo Doppler study showed normal systolic function with mild/moderate tricuspid regurgitation. Mild pulmonary hypertension with a PA pressure 33 mm. He was found to have colonic ischemia with presumed appendix inflammation.  From a cardiac standpoint, he ultimately tolerated his abdominal surgery where he was found to have colonic ischemia and underwent laparoscopic ileo-colectomy by Dr. [name].   A nuclear perfusion which on 11/16/2012 revealed a perfusion defect in the mid to distal anteroapical, apical, inferoapical walls consistent with distal LAD territory scar. Ejection fraction was 54% there was mild anteroapical hypokinesis. This was not significantly changed from his last nuclear study in 2010.  Additional problems include  a history of sleep apnea and uses CPAP therapy 100% of the time. uses [employer] for his DME company.  He admits to 100% compliance.  He denies any  awareness of breakthrough snoring.  He denies any hypersomnolence.  His sleep is restorative.  He denies chest pain, PND orthopnea.  He denies presyncope.  He had been taking low-dose aspirin 2 tablets daily.  I recommended reduction to 81 mg.  He is unaware of any bleeding.  He does admit to low back discomfort.  He is been found on CT imaging to have bulging disks.  He has seen Dr. [name] for neurosurgical evaluation has undergone epidural injections.  When I saw him in February 2018 his atenolol dose had been reduced by his primary physician from 100 mg down to 50 mg.  He was on atenolol 50 mg and lisinopril 20 mg as well as furosemide 40 mg for hypertension.  He denies any significant edema.  He was tachycardic and I titrated atenolol up to 75 mg.  He continued to be on simvastatin for hyperlipidemia.  He has obstructive sleep apnea and admitted to 100% compliance with CPAP therapy.  Prescription was written for new supplies with his DME company of [facility].   When I saw him in September 2018 at which time he was without chest pain shortness of breath or palpitations.  A nuclear study on August 20, 2016 showed an EF of 36% with moderate scar apically without associated ischemia.  There was a suggestion of possible mild transient ischemic dilation.  He continued to be asymptomatic.  He underwent low back surgery with Dr. [name].  He was placed on oxycodone for pain discomfort following surgery.  He has not liked the mental aspects of this drug.  He tells me he did on one day take his wife's gabapentin and  noticed improvement in his peripheral neuropathy and leg discomfort and was questioning about possibly using this instead.  He admits to significant lower extremity edema bilaterally.  He denies recurrent chest pain PND orthopnea.  He continues to use CPAP with 100% compliance.  When I saw him in March 2019 he had 3+ tense lower extremity edema and his blood pressure was mildly increased.  He  was taking atenolol 75 mg daily, furosemide 40 mg in the morning and 20 mg in the late afternoon in addition to lisinopril 30 mg daily.  I discontinued furosemide and changed him to torsemide 40 mg twice a day for several days but then dose reduction to 40 mg in the morning and 20 mg in the late afternoon but he could increase this back to 40 twice daily with recurrent edema.  He has only noticed minimal benefit with the leg swelling.  Typically in the morning he hardly has any edema but as the day progresses his edema significantly increases.  He denies any chest pain.  He continues to use CPAP with 100% compliance.    At F/U in May 2019 he continued to have at least 2+ tense edema as the day progressed.  I recommended metolazone 2.5 mg 30 minutes prior to his morning dose of torsemide for 2 days and then recommended changing this to every other day and then every third day depending upon his leg edema.  This did improve his edema but apparently his blood pressure decreased and he stopped taking this after only several doses.  He had lost 11 pounds since that evaluation.    When I saw him in June 2019 he again experienced significant leg swelling.  I recommended support stockings with at least 20-30 mm pressure support.  I suggested a slight rechallenge of metolazone and recommended 2.5 mg once a week 30 minutes before the morning torsemide dose and at that time he was to decrease lisinopril to 20 mg prior to reinitiating weekly metolazone.  Renal function was stable with a creatinine of 1.01.    I last saw him in August 2019 at which time he had noticed some improvement with metolazone.  At time I increased this to 2 days/week and again recommended compression stockings.  Over the past several months, he has felt well.  He states his blood pressure at home typically is run 120/70.  His leg swelling has improved.  He now sees Dr. Claris Gower for his primary care.  He did not take his torsemide nor his  metolazone today and he does have leg swelling today.  He did see Joseph Bryan in September 2019.  He will be undergoing laboratory with Dr. Arelia Sneddon.   He admits to 100% CPAP use.  He denies any chest pain PND orthopnea or arrhythmias.  He presents for reevaluation.   Past Medical History:  Diagnosis Date  . 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 Coronary artery disease   . Glaucoma   . Heart disease   . Hyperlipidemia   . Hypertension   . Liver disease    pt denies  . Lung disease    pt denies  . Myocardial infarction ([place]) 17 yrs ago  . Neuropathy    feet  . Sleep apnea with use of continuous positive airway pressure (CPAP)    uses CPAP    Past Surgical History:  Procedure Laterality  Date  . APPENDECTOMY  2014  . COLON RESECTION N/A 08/27/2012   Procedure: Diagnostic laparoscopy; Ileocecectomy;  Surgeon: Madilyn Hook, DO;  Location: [facility] ORS;  Service: General;  Laterality: N/A;  . COLONOSCOPY WITH PROPOFOL N/A 04/09/2015   Procedure: COLONOSCOPY WITH PROPOFOL;  Surgeon: Garlan Fair, MD;  Location: [facility] ENDOSCOPY;  Service: Endoscopy;  Laterality: N/A;  . CORONARY ARTERY BYPASS GRAFT  17 yrs ago   x 5  . LUMBAR LAMINECTOMY/DECOMPRESSION MICRODISCECTOMY N/A 10/19/2015   Procedure: Lumbar three to four LAMINECTOMY/FORAMINOTOMY;  Surgeon: Erline Levine, MD;  Location: [facility];  Service: Neurosurgery;  Laterality: N/A;  . LUNG SURGERY  yrs ago   right and left lungs - "had air pockets"    Allergies  Allergen Reactions  . Other Rash    CHG soap and wipes    Current Outpatient Medications  Medication Sig Dispense Refill  . atenolol (TENORMIN) 50 MG tablet Take 1.5 tablets (75 mg total) by mouth daily. 135 tablet 3  . Bee Pollen 580 MG CAPS Take 3 capsules by mouth daily.     Marland Kitchen gabapentin (NEURONTIN) 300 MG capsule Take 1 capsule (300 mg total) by mouth 2 (two) times daily. 180 capsule 3  .  lisinopril (PRINIVIL,ZESTRIL) 20 MG tablet Take 1 tablet (20 mg total) by mouth daily. 90 tablet 3  . metolazone (ZAROXOLYN) 2.5 MG tablet Take as directed (20-30 mins prior to morning dose of torsemide) 30 tablet 3  . potassium chloride SA (K-DUR,KLOR-CON) 20 MEQ tablet Take 1 tablet by mouth when you take a metolazone 30 tablet 3  . pyridOXINE (VITAMIN B-6) 100 MG tablet Take 100 mg by mouth daily.    . rosuvastatin (CRESTOR) 20 MG tablet Take 0.5 tablets (10 mg total) by mouth daily. 45 tablet 3  . torsemide (DEMADEX) 20 MG tablet Take 2 tablets (40 mg total) by mouth 2 (two) times daily. And as directed 360 tablet 3  . vitamin B-12 (CYANOCOBALAMIN) 1000 MCG tablet Take 1,000 mcg by mouth daily.     No current facility-administered medications for this visit.     Social History   Socioeconomic History  . Marital status: Married    Spouse name: Not on file  . Number of children: Not on file  . Years of education: Not on file  . Highest education level: Not on file  Occupational History  . Not on file  Social Needs  . Financial resource strain: Not on file  . Food insecurity:    Worry: Not on file    Inability: Not on file  . Transportation needs:    Medical: Not on file    Non-medical: Not on file  Tobacco Use  . Smoking status: Former Smoker    Types: Cigarettes    Last attempt to quit: 08/25/1988    Years since quitting: 29.2  . Smokeless tobacco: Never Used  Substance and Sexual Activity  . Alcohol use: No    Alcohol/week: 0.0 standard drinks  . Drug use: No  . Sexual activity: Not on file  Lifestyle  . Physical activity:    Days per week: Not on file    Minutes per session: Not on file  . Stress: Not on file  Relationships  . Social connections:    Talks on phone: Not on file    Gets together: Not on file    Attends religious service: Not on file    Active member of club or organization: Not on file  Attends meetings of clubs or organizations: Not on file     Relationship status: Not on file  . Intimate partner violence:    Fear of current or ex partner: Not on file    Emotionally abused: Not on file    Physically abused: Not on file    Forced sexual activity: Not on file  Other Topics Concern  . Not on file  Social History Narrative  . Not on file    Family History  Problem Relation Age of Onset  . Hypertension Father   . Hypertension Sister   . Heart attack Brother    He is married has 3 children 4 grandchildren 2 great-grandchildren. There is remote tobacco history.  ROS General: Negative; No fevers, chills, or night sweats;  HEENT:  positive for cataract/glaucoma surgery, no sinus congestion, difficulty swallowing Pulmonary: Negative; No cough, wheezing, shortness of breath, hemoptysis Cardiovascular: Negative; No chest pain, presyncope, syncope, palpitations Positive for leg swelling GI: Negative; No nausea, vomiting, diarrhea, or abdominal pain GU: Negative; No dysuria, hematuria, or difficulty voiding Musculoskeletal: Positive for low back pain and herniated disks; he walks with a cane status post recent surgery on L3-4 and L4-5;  Hematologic/Oncology: Negative; no easy bruising, bleeding Endocrine: Negative; no heat/cold intolerance; no diabetes Neuro: Negative; no changes in balance, headaches Skin: Negative; No rashes or skin lesions Psychiatric: Negative; No behavioral problems, depression Sleep: Positive for obstructive sleep apnea on CPAP therapy.  No snoring, daytime sleepiness, hypersomnolence, bruxism, restless legs, hypnogognic hallucinations, no cataplexy Other comprehensive 14 point system review is negative.   PE BP 121/68   Pulse 63   Resp 16   Ht 6' 1" (1.854 m)   Wt 268 lb (121.6 kg)   SpO2 92%   BMI 35.36 kg/m    Repeat blood pressure by me 126/74  Wt Readings from Last 3 Encounters:  11/30/17 268 lb (121.6 kg)  08/17/17 284 lb 12.8 oz (129.2 kg)  06/22/17 267 lb (121.1 kg)    General: Alert,  oriented, no distress.  Skin: normal turgor, no rashes, warm and dry HEENT: Normocephalic, atraumatic. Pupils equal round and reactive to light; sclera anicteric; extraocular muscles intact; Nose without nasal septal hypertrophy Mouth/Parynx benign; Mallinpatti scale 3 Neck: No JVD, no carotid bruits; normal carotid upstroke Lungs: clear to ausculatation and percussion; no wheezing or rales Chest wall: without tenderness to palpitation Heart: PMI not displaced, RRR, s1 s2 normal, 1/6 systolic murmur, no diastolic murmur, no rubs, gallops, thrills, or heaves Abdomen: soft, nontender; no hepatosplenomehaly, BS+; abdominal aorta nontender and not dilated by palpation. Back: no CVA tenderness Pulses 2+ Musculoskeletal: full range of motion, normal strength, no joint deformities Extremities: he did not take his metolazone or torsemide today.  There is 1-2+ bilateral edema; no clubbing cyanosis or edema, Homan's sign negative  Neurologic: grossly nonfocal; Cranial nerves grossly wnl Psychologic: Normal mood and affect  ECG (independently read by me): Probable low atrial rhythm with inverted P waves in lead III and aVF.  Right bundle branch block with repolarization.  August 2019 ECG (independently read by me):  Sinus bradycardia at 55; RBBB, LAHB, normal intervals  June 2019 ECG (independently read by me): Normal sinus rhythm at 60 bpm.  Right bundle branch block with repolarization changes.  March 2019 ECG (independently read by me): sinus rhythm at 86 bpm with occasional PACs. 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  QTc interval 473 ms  September 2018 ECG (independently read  by me): Normal sinus rhythm at 65 bpm.  Right bundle-branch block with repolarization changes.  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  LVH by voltage.  Normal intervals.  No ectopy.  February 2018 ECG (independently read by me): Sinus tachycardia at 102 bpm, right  bundle branch block, left anterior hemiblock.  LVH.  February 2017 ECG (independently read by me): Normal sinus rhythm at 63 bpm.  Right bundle-branch block with repolarization changes.  Left axis deviation.  Normal intervals.  December 2015 ECG (independently read by me): Sinus bradycardia 56 bpm.  Right bundle branch block with repolarization changes.  Normal intervals.  December 2014 ECG: Sinus rhythm at 67 beats per minute with right bundle branch block and nonspecific ST-T changes. Qtc 464 msec  LABS: BMP Latest Ref Rng & Units 08/17/2017 06/03/2017 01/20/2017  Glucose 65 - 99 mg/dL 84 94 118(H)  BUN 8 - 27 mg/dL 16 16 26(H)  Creatinine 0.76 - 1.27 mg/dL 1.05 1.01 1.05  BUN/Creat Ratio 10 - _0 -  Sodium 134 - 144 mmol/L 145(H) 142 132(L)  Potassium 3.5 - 5.2 mmol/L 4.9 4.2 4.4  Chloride 96 - 106 mmol/L 102 100 97(L)  CO2 20 - 29 mmol/L _1 Calcium 8.6 - 10.2 mg/dL 9.2 9.2 8.6(L)   Hepatic Function Latest Ref Rng & Units 08/17/2017 06/03/2017 01/12/2017  Total Protein 6.0 - 8.5 g/dL 7.3 7.6 7.0  Albumin 3.5 - 4.8 g/dL 3.7 3.8 3.6  AST 0 - 40 IU/L 52(H) 66(H) 68(H)  ALT 0 - 44 IU/L 39 51(H) 85(H)  Alk Phosphatase 39 - 117 IU/L 240(H) 288(H) 170(H)  Total Bilirubin 0.0 - 1.2 mg/dL 0.4 0.5 0.8  Bilirubin, Direct <=0.2 mg/dL - - -   CBC Latest Ref Rng & Units 08/17/2017 01/20/2017 01/12/2017  WBC 3.4 - 10.8 x10E3/uL 6.6 14.4(H) 10.0  Hemoglobin 13.0 - 17.7 g/dL 12.7(L) 10.7(L) 13.5  Hematocrit 37.5 - 51.0 % 39.4 33.7(L) 42.9  Platelets 150 - 450 x10E3/uL 158 140(L) 163   Lab Results  Component Value Date   TSH 5.430 (H) 10/28/2016   Lab Results  Component Value Date   MCV 93 08/17/2017   MCV 94.7 01/20/2017   MCV 96.6 01/12/2017   Lipid Panel     Component Value Date/Time   CHOL 144 10/28/2016 1155   TRIG 87 10/28/2016 1155   HDL 64 10/28/2016 1155   CHOLHDL 2.3 10/28/2016 1155   CHOLHDL 2.1 04/17/2016 1112   VLDL 18 04/17/2016 1112   LDLCALC 63 10/28/2016 1155       RADIOLOGY: No results found.  IMPRESSION:  1. Coronary artery disease due to lipid rich plaque   2. Hx of CABG   3. OSA on CPAP   4. RBBB   5. Bilateral leg edema   6. Moderate obesity   7. Hyperlipidemia with target LDL less than 70   8. h/o elevated LFTs      ASSESSMENT AND PLAN: Joseph Bryan is a 77 year old white male who is 21 years status post an anterior wall myocardial infarction treated with PTCA of a totally occluded LAD in 1998. Due to severe  left main stenosis he underwent CABG surgery with a LIMA to his LAD, vein to diagonal, and vein to the third marginal vessel. He has been documented have significant salvage of myocardium from his acute PTCA. An echo Doppler study done preoperatively showed a normal  ejection fraction with mild to moderate TR.  A nuclear perfusion study in 2014 showed distal  LAD territory scar concordant with his prior myocardial infarction.   His last nuclear study revealed an EF of 36% with scar and raised the possibility of mild transient ischemic dilation.  I personally reviewed the myocardial perfusion images.  An echo Doppler study October 17, 2016 however showed an EF of 45-50% and there was diffuse hypokinesis with grade 1 diastolic dysfunction.  There was mild aortic insufficiency.  His left atrium was mild to moderately dilated.  There was moderate tricuspid regurgitation.  Peak PA pressure was mildly elevated at 35 mm presently.  His blood pressure today is stable on his current regimen now consisting of atenolol 85 mg daily, lisinopril 20 mg, metolazone 2.5 mg 2 times per week in addition to torsemide 40 mg twice a day.  His peripheral edema has improved with the addition of metolazone biweekly.  He does have edema today but he states he did not take his medications due to the need to urinate since he was going to be away from home.  He states typically his edema is almost resolved in the morning.  He is tolerating rosuvastatin for hyperlipidemia.   Most recent LDL cholesterol was 63.  The past he had mild LFT elevation.  Liver studies were rechecked in August 2019 and ALT was 39.  He tells me Dr. Claris Gower will be rechecking blood work next week for one-year follow-up evaluation.  He is moderately obese with a BMI of 35.36.  Weight loss and increased exercise was recommended.  He continues to use CPAP with 100% compliance.  He is sleeping well.  He denies breakthrough snoring and his sleep is restorative.  He denies residual daytime sleepiness.  I will see him in 6 months for reevaluation  Time spent: 25 minutes Troy Sine, MD, [facility]  12/02/2017 6:36 PM

## 2017-11-30 NOTE — Patient Instructions (Signed)
Medication Instructions:  Your physician recommends that you continue on your current medications as directed. Please refer to the Current Medication list given to you today.  If you need a refill on your cardiac medications before your next appointment, please call your pharmacy.   Follow-Up: At [facility], you and your health needs are our priority.  As part of our continuing mission to provide you with exceptional heart care, we have created designated Provider Care Teams.  These Care Teams include your primary Cardiologist (physician) and Advanced Practice Providers (APPs -  Physician Assistants and Nurse Practitioners) who all work together to provide you with the care you need, when you need it. You will need a follow up appointment in 6 months.  Please call our office 2 months in advance to schedule this appointment.  You may see Dr. Kelly or one of the following Advanced Practice Providers on your designated Care Team: Hao Meng, PA-C . Angela Duke, PA-C  .  

## 2017-12-02 ENCOUNTER — Encounter: Payer: Self-pay | Admitting: Cardiovascular Disease

## 2018-01-03 ENCOUNTER — Emergency Department (HOSPITAL_COMMUNITY)
Admission: EM | Admit: 2018-01-03 | Discharge: 2018-01-03 | Disposition: A | Discharge status: Home / Self Care | Payer: Medicare Other | Attending: Emergency Medicine | Admitting: Emergency Medicine

## 2018-01-03 ENCOUNTER — Encounter (HOSPITAL_COMMUNITY): Payer: Self-pay

## 2018-01-03 ENCOUNTER — Other Ambulatory Visit: Payer: Self-pay

## 2018-01-03 DIAGNOSIS — Z951 Presence of aortocoronary bypass graft: Secondary | ICD-10-CM | POA: Diagnosis not present

## 2018-01-03 DIAGNOSIS — I2510 Atherosclerotic heart disease of native coronary artery without angina pectoris: Secondary | ICD-10-CM | POA: Insufficient documentation

## 2018-01-03 DIAGNOSIS — R319 Hematuria, unspecified: Secondary | ICD-10-CM | POA: Diagnosis present

## 2018-01-03 DIAGNOSIS — Z859 Personal history of malignant neoplasm, unspecified: Secondary | ICD-10-CM | POA: Insufficient documentation

## 2018-01-03 DIAGNOSIS — I129 Hypertensive chronic kidney disease with stage 1 through stage 4 chronic kidney disease, or unspecified chronic kidney disease: Secondary | ICD-10-CM | POA: Insufficient documentation

## 2018-01-03 DIAGNOSIS — N189 Chronic kidney disease, unspecified: Secondary | ICD-10-CM | POA: Insufficient documentation

## 2018-01-03 DIAGNOSIS — Z79899 Other long term (current) drug therapy: Secondary | ICD-10-CM | POA: Diagnosis not present

## 2018-01-03 DIAGNOSIS — Z87891 Personal history of nicotine dependence: Secondary | ICD-10-CM | POA: Insufficient documentation

## 2018-01-03 LAB — CBC WITH DIFFERENTIAL/PLATELET
Abs Immature Granulocytes: 0.02 10*3/uL (ref 0.00–0.07)
BASOS PCT: 1 %
Basophils Absolute: 0.1 10*3/uL (ref 0.0–0.1)
EOS ABS: 0.2 10*3/uL (ref 0.0–0.5)
EOS PCT: 3 %
HCT: 39.3 % (ref 39.0–52.0)
Hemoglobin: 12 g/dL — ABNORMAL LOW (ref 13.0–17.0)
Immature Granulocytes: 0 %
Lymphocytes Relative: 13 %
Lymphs Abs: 0.9 10*3/uL (ref 0.7–4.0)
MCH: 30 pg (ref 26.0–34.0)
MCHC: 30.5 g/dL (ref 30.0–36.0)
MCV: 98.3 fL (ref 80.0–100.0)
MONOS PCT: 10 %
Monocytes Absolute: 0.6 10*3/uL (ref 0.1–1.0)
NEUTROS PCT: 73 %
Neutro Abs: 4.9 10*3/uL (ref 1.7–7.7)
PLATELETS: 150 10*3/uL (ref 150–400)
RBC: 4 MIL/uL — ABNORMAL LOW (ref 4.22–5.81)
RDW: 14.1 % (ref 11.5–15.5)
WBC: 6.6 10*3/uL (ref 4.0–10.5)
nRBC: 0 % (ref 0.0–0.2)

## 2018-01-03 LAB — BASIC METABOLIC PANEL
ANION GAP: 9 (ref 5–15)
BUN: 16 mg/dL (ref 8–23)
CALCIUM: 8.7 mg/dL — AB (ref 8.9–10.3)
CO2: 26 mmol/L (ref 22–32)
Chloride: 104 mmol/L (ref 98–111)
Creatinine, Ser: 0.98 mg/dL (ref 0.61–1.24)
GFR calc Af Amer: >60 mL/min (ref >60)
GLUCOSE: 98 mg/dL (ref 70–99)
Potassium: 3.7 mmol/L (ref 3.5–5.1)
Sodium: 139 mmol/L (ref 135–145)

## 2018-01-03 LAB — URINALYSIS, ROUTINE W REFLEX MICROSCOPIC

## 2018-01-03 LAB — URINALYSIS, MICROSCOPIC (REFLEX): RBC / HPF: >50 RBC/hpf (ref 0–5)

## 2018-01-03 NOTE — ED Triage Notes (Signed)
Patient presents with hematuria, starting this morning. 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 Patient denies dysuria. Patient denies fever.

## 2018-01-03 NOTE — Discharge Instructions (Addendum)
If you develop pain while urinating, inability to fully urinate, fever, vomiting, abdominal pain, flank pain, or back pain, or any other new/concerning symptoms then return to the ER for evaluation.

## 2018-01-03 NOTE — ED Provider Notes (Signed)
[facility] DEPT Provider Note   CSN: [id number] Arrival date & time: 01/03/18  1024     History   Chief Complaint Chief Complaint  Patient presents with  . Hematuria    HPI Joseph Bryan is a 77 y.o. male.  HPI  77 year old male presents with painless hematuria.  Thinks it started yesterday but he was not sure because he had also had a bowel movement and there was blood in the toilet.  However this morning when he urinated it was "straight blood".  There are no clots.  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  He is on 2 full dose aspirins per day.  He denies any injuries to his abdomen or back.  There is no flank, back or abdominal pain.  No vomiting or fever.  There is no dysuria, testicular pain or penile pain.  He feels like he is urinating and clearing his bladder.  Past Medical History:  Diagnosis Date  . 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 Coronary artery disease   . Glaucoma   . Heart disease   . Hyperlipidemia   . Hypertension   . Liver disease    pt denies  . Lung disease    pt denies  . Myocardial infarction ([place]) 17 yrs ago  . Neuropathy    feet  . Sleep apnea with use of continuous positive airway pressure (CPAP)    uses CPAP    Patient Active Problem List   Diagnosis Date Noted  . Lumbar spinal stenosis 01/16/2017  . Hyperlipidemia LDL goal <70 04/30/2016  . Elevated LFTs 04/30/2016  . Spinal stenosis, lumbar region, with neurogenic claudication 10/19/2015  . Morbid obesity ([place]) 12/15/2013  . Leg edema 10/08/2012  . OSA on CPAP 10/08/2012  . RBBB 10/08/2012  . History of MI (myocardial infarction) 10/08/2012  . Anasarca 09/01/2012  . Abdominal pain, acute, right lower quadrant 08/26/2012  . HTN (hypertension) 08/26/2012  . Obesity, unspecified 08/26/2012  . CAD- s/p CABG  in 1998 - patent grafts on cath in 2010 08/26/2012    Past Surgical History:  Procedure Laterality Date  . APPENDECTOMY  2014  . COLON RESECTION N/A 08/27/2012   Procedure: Diagnostic laparoscopy; Ileocecectomy;  Surgeon: Madilyn Hook, DO;  Location: [facility] ORS;  Service: General;  Laterality: N/A;  . COLONOSCOPY WITH PROPOFOL N/A 04/09/2015   Procedure: COLONOSCOPY WITH PROPOFOL;  Surgeon: Garlan Fair, MD;  Location: [facility] ENDOSCOPY;  Service: Endoscopy;  Laterality: N/A;  . CORONARY ARTERY BYPASS GRAFT  17 yrs ago   x 5  . LUMBAR LAMINECTOMY/DECOMPRESSION MICRODISCECTOMY N/A 10/19/2015   Procedure: Lumbar three to four LAMINECTOMY/FORAMINOTOMY;  Surgeon: Erline Levine, MD;  Location: [facility];  Service: Neurosurgery;  Laterality: N/A;  . LUNG SURGERY  yrs ago   right and left lungs - "had air pockets"        Home Medications    Prior to Admission medications   Medication Sig Start Date End Date Taking? Authorizing Provider  atenolol (TENORMIN) 50 MG tablet Take 1.5 tablets (75 mg total) by mouth daily. 06/22/17  Yes Troy Sine, MD  BEE POLLEN PO Take 3 capsules by mouth daily.    Yes [provider]  gabapentin (NEURONTIN) 300 MG capsule Take 1 capsule (300 mg total) by mouth  2 (two) times daily. 06/22/17  Yes Troy Sine, MD  lisinopril (PRINIVIL,ZESTRIL) 20 MG tablet Take 1 tablet (20 mg total) by mouth daily. 06/22/17  Yes Troy Sine, MD  metolazone (ZAROXOLYN) 2.5 MG tablet Take as directed (20-30 mins prior to morning dose of torsemide) Patient taking differently: Take 2.5 mg by mouth 2 (two) times a week.  06/22/17  Yes Troy Sine, MD  potassium chloride SA (K-DUR,KLOR-CON) 20 MEQ tablet Take 1 tablet by mouth when you take a metolazone Patient taking differently: Take 20 mEq by mouth 2 (two) times a week. Take 1 tablet by mouth when you take a metolazone 06/03/17  Yes Troy Sine, MD  pyridOXINE (VITAMIN B-6) 100 MG tablet Take 100 mg by mouth daily.   Yes  [provider]  rosuvastatin (CRESTOR) 20 MG tablet Take 0.5 tablets (10 mg total) by mouth daily. Patient taking differently: Take 10 mg by mouth at bedtime.  06/22/17  Yes Troy Sine, MD  torsemide (DEMADEX) 20 MG tablet Take 2 tablets (40 mg total) by mouth 2 (two) times daily. And as directed 06/24/17  Yes Troy Sine, MD  vitamin B-12 (CYANOCOBALAMIN) 1000 MCG tablet Take 1,000 mcg by mouth daily.   Yes [provider]    Family History Family History  Problem Relation Age of Onset  . Hypertension Father   . Hypertension Sister   . Heart attack Brother     Social History Social History   Tobacco Use  . Smoking status: Former Smoker    Types: Cigarettes    Last attempt to quit: 08/25/1988    Years since quitting: 29.3  . Smokeless tobacco: Never Used  Substance Use Topics  . Alcohol use: No    Alcohol/week: 0.0 standard drinks  . Drug use: No     Allergies   Other   Review of Systems Review of Systems  Constitutional: Negative for fever.  Gastrointestinal: Negative for abdominal pain and vomiting.  Genitourinary: Positive for hematuria. Negative for dysuria, flank pain, penile pain and testicular pain.  Musculoskeletal: Negative for back pain.  All other systems reviewed and are negative.    Physical Exam Updated Vital Signs BP 110/62 (BP Location: Left Arm)   Pulse 60   Temp (!) 97.4 F (36.3 C) (Oral)   Resp 18   Ht 6\' 1"  (1.854 m)   Wt 124.7 kg   SpO2 98%   BMI 36.28 kg/m   Physical Exam Vitals signs and nursing note reviewed.  Constitutional:      Appearance: He is well-developed. He is obese.  HENT:     Head: Normocephalic and atraumatic.     Right Ear: External ear normal.     Left Ear: External ear normal.     Nose: Nose normal.  Eyes:     General:        Right eye: No discharge.        Left eye: No discharge.  Neck:     Musculoskeletal: Neck supple.  Cardiovascular:     Rate and Rhythm: Normal rate and  regular rhythm.     Heart sounds: Normal heart sounds.  Pulmonary:     Effort: Pulmonary effort is normal.     Breath sounds: Normal breath sounds.  Abdominal:     Palpations: Abdomen is soft.     Tenderness: There is no abdominal tenderness. There is no right CVA tenderness or left CVA tenderness.  Genitourinary:    Penis: Circumcised.  No erythema, tenderness, discharge or swelling.      Scrotum/Testes:        Right: Tenderness not present.        Left: Tenderness not present.  Skin:    General: Skin is warm and dry.  Neurological:     Mental Status: He is alert.  Psychiatric:        Mood and Affect: Mood is not anxious.      ED Treatments / Results  Labs (all labs ordered are listed, but only abnormal results are displayed) Labs Reviewed  URINALYSIS, ROUTINE W REFLEX MICROSCOPIC - Abnormal; Notable for the following components:      Result Value   Color, Urine RED (*)    APPearance TURBID (*)    Glucose, UA   (*)    Value: TEST NOT REPORTED DUE TO COLOR INTERFERENCE OF URINE PIGMENT   Hgb urine dipstick   (*)    Value: TEST NOT REPORTED DUE TO COLOR INTERFERENCE OF URINE PIGMENT   Bilirubin Urine   (*)    Value: TEST NOT REPORTED DUE TO COLOR INTERFERENCE OF URINE PIGMENT   Ketones, ur   (*)    Value: TEST NOT REPORTED DUE TO COLOR INTERFERENCE OF URINE PIGMENT   Protein, ur   (*)    Value: TEST NOT REPORTED DUE TO COLOR INTERFERENCE OF URINE PIGMENT   Nitrite   (*)    Value: TEST NOT REPORTED DUE TO COLOR INTERFERENCE OF URINE PIGMENT   Leukocytes, UA   (*)    Value: TEST NOT REPORTED DUE TO COLOR INTERFERENCE OF URINE PIGMENT   All other components within normal limits  BASIC METABOLIC PANEL - Abnormal; Notable for the following components:   Calcium 8.7 (*)    All other components within normal limits  CBC WITH DIFFERENTIAL/PLATELET - Abnormal; Notable for the following components:   RBC 4.00 (*)    Hemoglobin 12.0 (*)    All other components within normal  limits  URINALYSIS, MICROSCOPIC (REFLEX) - Abnormal; Notable for the following components:   Bacteria, UA RARE (*)    All other components within normal limits    EKG None  Radiology No results found.  Procedures Procedures (including critical care time)  Medications Ordered in ED Medications - No data to display   Initial Impression / Assessment and Plan / ED Course  I have reviewed the triage vital signs and the nursing notes.  Pertinent labs & imaging results that were available during my care of the patient were reviewed by me and considered in my medical decision making (see chart for details).     Patient's hematuria is painless.  Urine shows blood but no other findings such as obvious bacteria or inflammation with negative WBCs.  Renal function is baseline and hemoglobin is baseline.  Given no flank pain or abdominal pain I do not think emergent CT is needed.  He will definitely need urology follow-up.  Upon further questioning, the patient states that after he had the clot that caused his kidney to fail, the patient took it upon himself to add a second full dose aspirin to his regimen.  I am not sure if this is really warranted or how much benefit he is getting from this but he states his doctors know about this.  I discussed this could result in increased bleeding such as what he is having now and that he should call his PCP to discuss whether or not to continue the 650 mg  aspirin per day.  We discussed return precautions.  Final Clinical Impressions(s) / ED Diagnoses   Final diagnoses:  Painless hematuria    ED Discharge Orders    None       Sherwood Gambler, MD 01/03/18 1345

## 2018-02-02 ENCOUNTER — Other Ambulatory Visit: Payer: Self-pay | Admitting: Urology

## 2018-02-04 ENCOUNTER — Encounter: Payer: Self-pay | Admitting: Cardiovascular Disease

## 2018-02-04 ENCOUNTER — Ambulatory Visit
Admission: RE | Admit: 2018-02-04 | Discharge: 2018-02-04 | Disposition: A | Discharge status: Home / Self Care | Payer: Medicare Other | Source: Ambulatory Visit | Attending: Family Medicine | Admitting: Family Medicine

## 2018-02-04 ENCOUNTER — Other Ambulatory Visit: Payer: Self-pay | Admitting: Family Medicine

## 2018-02-04 DIAGNOSIS — Z01818 Encounter for other preprocedural examination: Secondary | ICD-10-CM

## 2018-02-08 ENCOUNTER — Telehealth: Payer: Self-pay

## 2018-02-08 NOTE — Telephone Encounter (Signed)
    [facility] Pre-operative Risk Assessment    Request for surgical clearance:  1. What type of surgery is being performed? Right Ureteroscopy/Holmium laser/stone removal stent palcement   2. When is this surgery scheduled? 02/25/2018   3. What type of clearance is required (medical clearance vs. Pharmacy clearance to hold med vs. Both)? Medical  4. Are there any medications that need to be held prior to surgery and how long? n/a   5. Practice name and name of physician performing surgery?          6.What is your office phone number? [phone]   7.   What is your office fax number [phone]  8.   Anesthesia type (None, local, MAC, general) ? Unknown   Ena Dawley 02/08/2018, 1:53 PM  _________________________________________________________________   (provider comments below)

## 2018-02-09 NOTE — Telephone Encounter (Signed)
   Primary Cardiologist: Shelva Majestic, MD  Chart reviewed as part of pre-operative protocol coverage. Patient was contacted 02/09/2018 in reference to pre-operative risk assessment for pending surgery as outlined below.  Joseph Bryan was last seen on 11/30/2017 by Dr. Claiborne Billings.  Since that day, Joseph Bryan has done well from a cardiac standpoint. Still somewhat limited in activity due to leg pain and slow healing from back surgery. He reports being able to complete >4 METs without anginal complaints. Therefore, based on ACC/AHA guidelines, the patient would be at acceptable risk for the planned procedure without further cardiovascular testing.   I will route this recommendation to the requesting party via Epic fax function and remove from pre-op pool.  Please call with questions.  Abigail Butts, PA-C 02/09/2018, 9:23 AM

## 2018-02-11 IMAGING — MR MR LUMBAR SPINE WO/W CM
4 of 7 series · 18 of 48 positions shown · IV contrast (Yes)
Comparison: MRI of the lumbar spine 10/15/2015.

CLINICAL DATA: Lumbar radiculopathy.

EXAM:
MRI LUMBAR SPINE WITHOUT AND WITH CONTRAST
TECHNIQUE: Multiplanar and multiecho pulse sequences of the lumbar spine were
obtained without and with intravenous contrast.
CONTRAST:  20mL MULTIHANCE GADOBENATE DIMEGLUMINE 529 MG/ML IV SOLN

[Series 2: T2 · sagittal · 4.0mm · 0.55mm/px · 3 of 12 slices shown (1 of 2)]
[im 1/12]
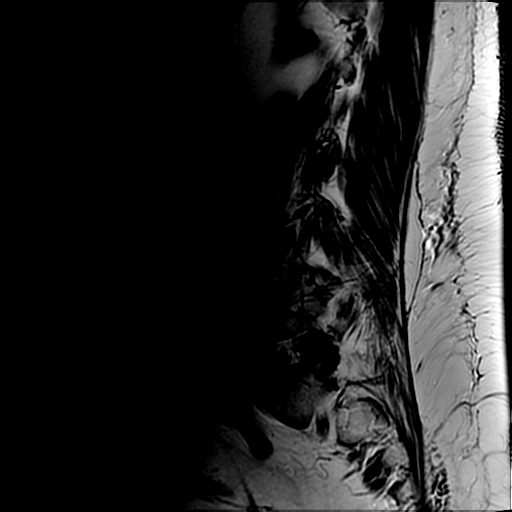
[im 6/12]
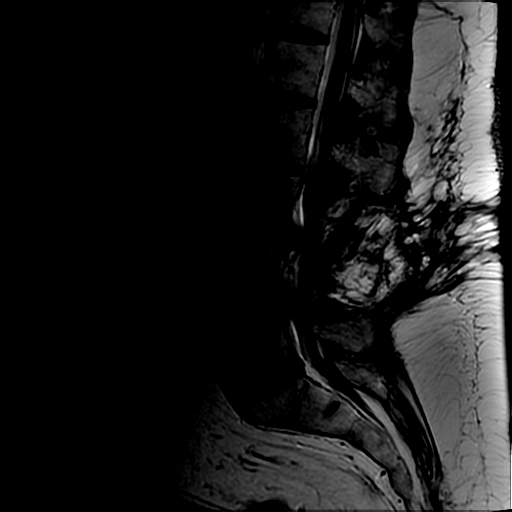
[im 12/12]
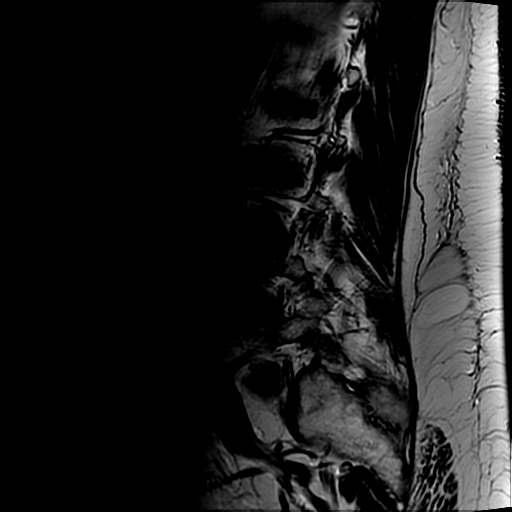

[Series 3: T1 · sagittal · 4.0mm · 0.59mm/px · 3 of 14 slices shown (1 of 2)]
[im 1/14]
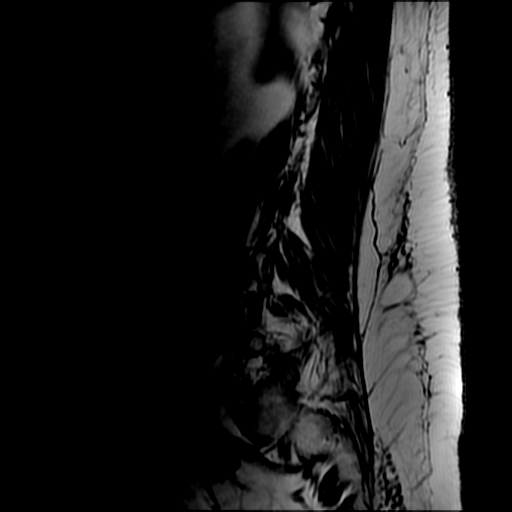
[im 9/14]
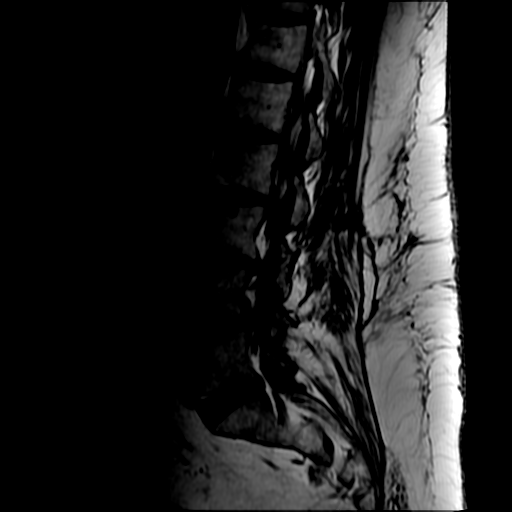
[im 14/14]
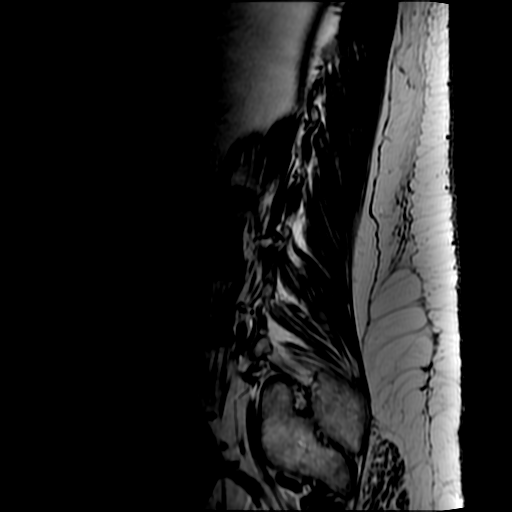

[Series 5: T2 · axial · 4.0mm · 0.39mm/px · z∈[-64,+106]mm · 9 of 39 slices shown (2 of 2)]
[im 1/39]
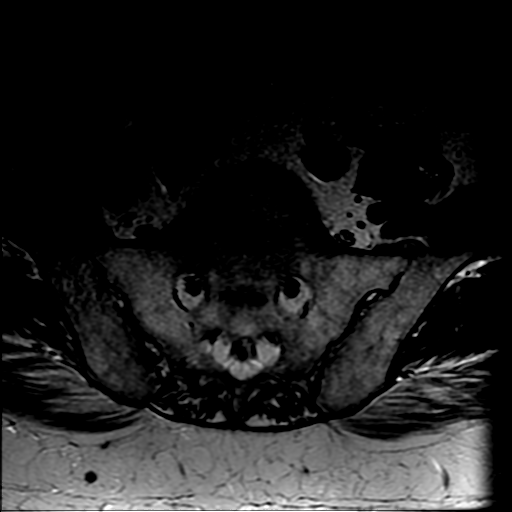
[im 4/39]
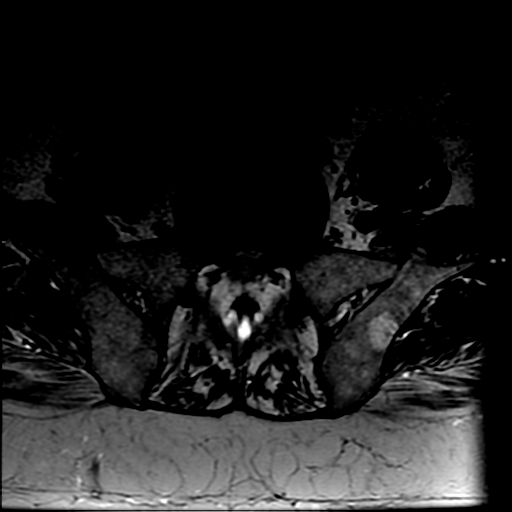
[im 8/39]
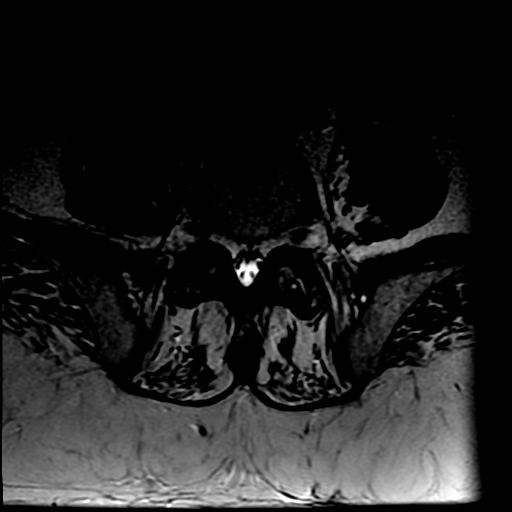
[im 12/39]
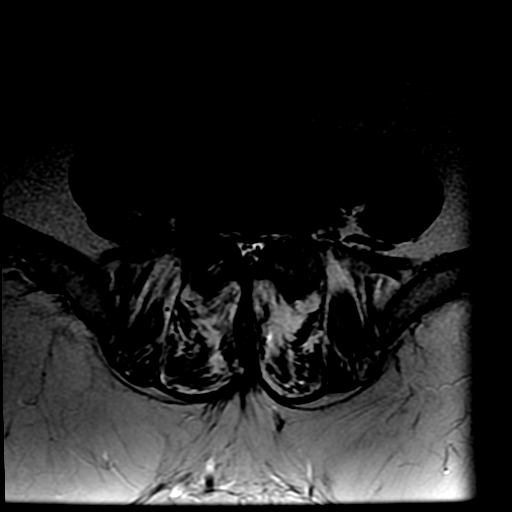
[im 16/39]
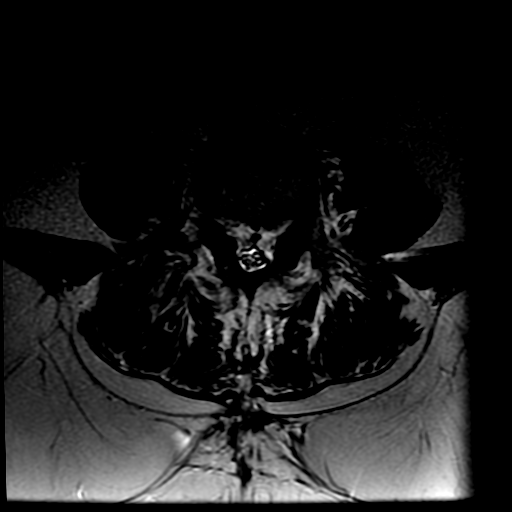
[im 20/39]
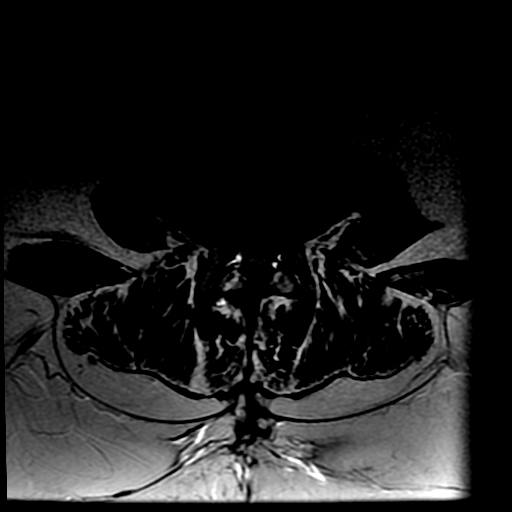
[im 23/39]
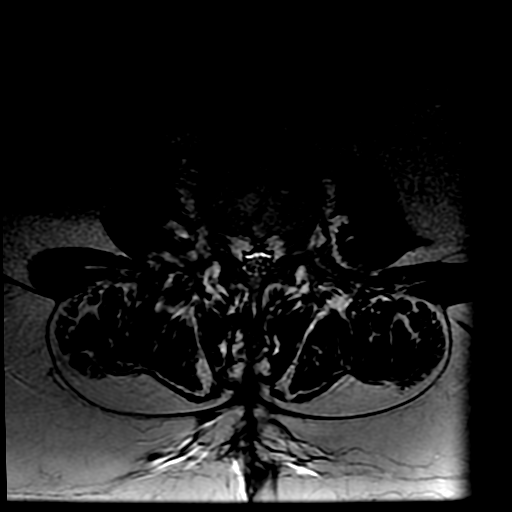
[im 27/39]
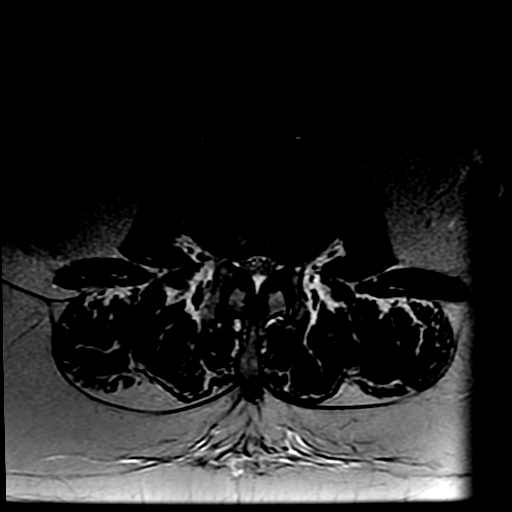
[im 35/39]
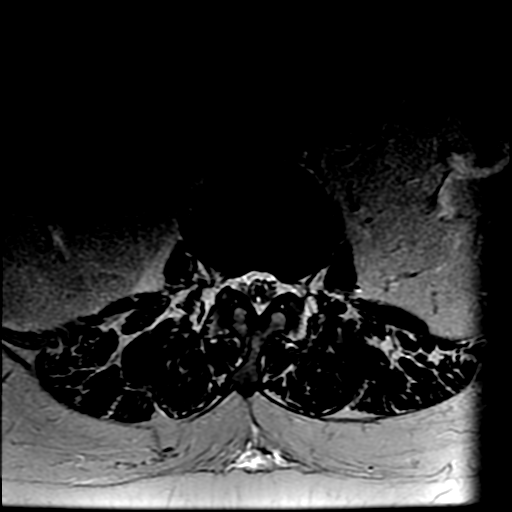

[Series 6: T1 · axial · 4.0mm · 0.39mm/px · z∈[-49,+106]mm · 3 of 39 slices shown (2 of 2)]
[im 4/39]
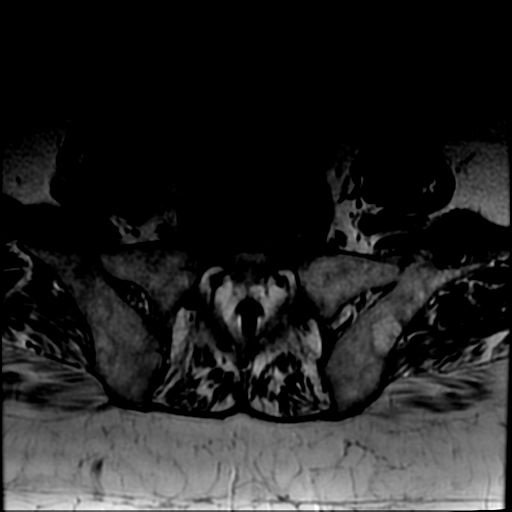
[im 20/39]
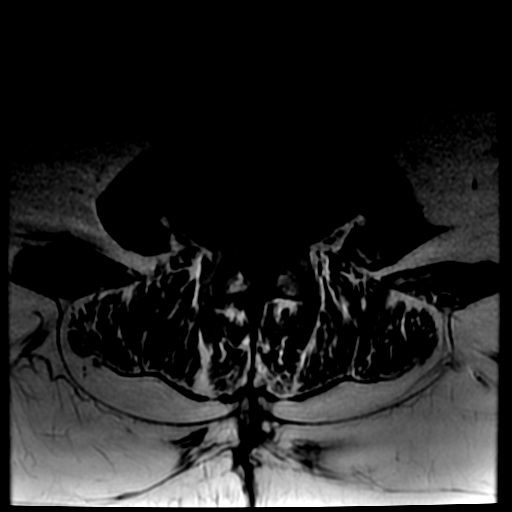
[im 35/39]
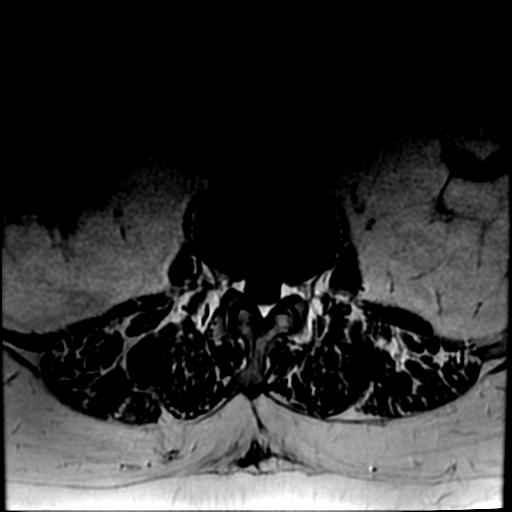

[18 of 48 positions shown; findings below may reference images not displayed]

FINDINGS: Segmentation: 5 non rib-bearing lumbar type vertebral bodies are
present.

Alignment:  AP alignment is anatomic.

Vertebrae: Schmorl's nodes are present at L to 3. Marrow signal and
vertebral body heights are maintained.

Conus medullaris and cauda equina: Conus extends to the L1-2 level.
Conus and cauda equina appear normal.

Paraspinal and other soft tissues: Limited imaging of the abdomen
demonstrates marked atrophy of the left kidney. No focal lesions are
present. There is no significant adenopathy.

Disc levels:

L1-2:  Negative.

L2-3: Mild facet hypertrophy is present bilaterally without focal
disc protrusion or stenosis. There is mild disc bulging.

L3-4: A wide laminectomy is noted. Residual recurrent central disc
protrusion results an moderate residual or recurrent central
stenosis. Mild foraminal narrowing is worse on the right, similar to
the prior exam.

L4-5: A wide laminectomy is present. Moderate facet hypertrophy is
still seen. A prominent central disc protrusion is recurrent, just
to the right of midline. Severe central canal stenosis is present
despite the laminectomy. Progressive moderate foraminal stenosis is
present bilaterally.

L5-S1: A mild broad-based disc protrusion is present. Mild facet
hypertrophy is noted bilaterally. The central canal and foramina are
patent bilaterally.
IMPRESSION: 1. Recurrent central disc protrusion at L3-4 with moderate central
canal stenosis despite laminectomy.
2. Stable foraminal narrowing bilaterally at L3-4, right greater
than left.
3. Recurrent right paramedian disc protrusion at L4-5 with severe
central canal stenosis.
4. Progressive moderate foraminal stenosis bilaterally at L4-5.
5. Stable facet hypertrophy at L2-3 and L5-S1 without significant
stenosis at either level.

## 2018-02-22 ENCOUNTER — Encounter (HOSPITAL_COMMUNITY)
Admission: RE | Admit: 2018-02-22 | Discharge: 2018-02-22 | Disposition: A | Discharge status: Home / Self Care | Payer: Medicare Other | Source: Ambulatory Visit | Attending: Urology | Admitting: Urology

## 2018-02-22 ENCOUNTER — Encounter (HOSPITAL_COMMUNITY): Payer: Self-pay

## 2018-02-22 ENCOUNTER — Other Ambulatory Visit: Payer: Self-pay

## 2018-02-22 DIAGNOSIS — Z7982 Long term (current) use of aspirin: Secondary | ICD-10-CM | POA: Diagnosis not present

## 2018-02-22 DIAGNOSIS — N201 Calculus of ureter: Secondary | ICD-10-CM | POA: Diagnosis present

## 2018-02-22 DIAGNOSIS — N202 Calculus of kidney with calculus of ureter: Secondary | ICD-10-CM | POA: Diagnosis not present

## 2018-02-22 DIAGNOSIS — R3129 Other microscopic hematuria: Secondary | ICD-10-CM | POA: Diagnosis not present

## 2018-02-22 DIAGNOSIS — Z01812 Encounter for preprocedural laboratory examination: Secondary | ICD-10-CM | POA: Insufficient documentation

## 2018-02-22 DIAGNOSIS — Z79899 Other long term (current) drug therapy: Secondary | ICD-10-CM | POA: Diagnosis not present

## 2018-02-22 DIAGNOSIS — E669 Obesity, unspecified: Secondary | ICD-10-CM | POA: Diagnosis not present

## 2018-02-22 DIAGNOSIS — Z6838 Body mass index (BMI) 38.0-38.9, adult: Secondary | ICD-10-CM | POA: Diagnosis not present

## 2018-02-22 DIAGNOSIS — Z87891 Personal history of nicotine dependence: Secondary | ICD-10-CM | POA: Diagnosis not present

## 2018-02-22 DIAGNOSIS — I2510 Atherosclerotic heart disease of native coronary artery without angina pectoris: Secondary | ICD-10-CM | POA: Diagnosis not present

## 2018-02-22 DIAGNOSIS — N400 Enlarged prostate without lower urinary tract symptoms: Secondary | ICD-10-CM | POA: Diagnosis not present

## 2018-02-22 DIAGNOSIS — Q600 Renal agenesis, unilateral: Secondary | ICD-10-CM | POA: Diagnosis not present

## 2018-02-22 DIAGNOSIS — G4730 Sleep apnea, unspecified: Secondary | ICD-10-CM | POA: Diagnosis not present

## 2018-02-22 DIAGNOSIS — I252 Old myocardial infarction: Secondary | ICD-10-CM | POA: Diagnosis not present

## 2018-02-22 DIAGNOSIS — I10 Essential (primary) hypertension: Secondary | ICD-10-CM | POA: Diagnosis not present

## 2018-02-22 DIAGNOSIS — Z8249 Family history of ischemic heart disease and other diseases of the circulatory system: Secondary | ICD-10-CM | POA: Diagnosis not present

## 2018-02-22 HISTORY — DX: Personal history of urinary calculi: Z87.442

## 2018-02-22 LAB — CBC
HCT: 41.5 % (ref 39.0–52.0)
HEMOGLOBIN: 12.5 g/dL — AB (ref 13.0–17.0)
MCH: 29.4 pg (ref 26.0–34.0)
MCHC: 30.1 g/dL (ref 30.0–36.0)
MCV: 97.6 fL (ref 80.0–100.0)
Platelets: 157 10*3/uL (ref 150–400)
RBC: 4.25 MIL/uL (ref 4.22–5.81)
RDW: 13.8 % (ref 11.5–15.5)
WBC: 6.5 10*3/uL (ref 4.0–10.5)
nRBC: 0 % (ref 0.0–0.2)

## 2018-02-22 LAB — BASIC METABOLIC PANEL
ANION GAP: 7 (ref 5–15)
BUN: 12 mg/dL (ref 8–23)
CO2: 28 mmol/L (ref 22–32)
Calcium: 9.1 mg/dL (ref 8.9–10.3)
Chloride: 103 mmol/L (ref 98–111)
Creatinine, Ser: 0.91 mg/dL (ref 0.61–1.24)
GFR calc non Af Amer: >60 mL/min (ref >60)
Glucose, Bld: 94 mg/dL (ref 70–99)
Potassium: 4.4 mmol/L (ref 3.5–5.1)
Sodium: 138 mmol/L (ref 135–145)

## 2018-02-22 NOTE — Patient Instructions (Addendum)
BECKAM ABDULAZIZ  02/22/2018   Your procedure is scheduled on: Thursday 02/25/2018  Report to [facility] Main  Entrance              Report to admitting at  [facility]  AM    Call this number if you have problems the morning of surgery [phone]                Please bring CPAP mask and tubing with you the morning of surgery!   Remember: Do not eat food or drink liquids :After Midnight.                BRUSH YOUR TEETH MORNING OF SURGERY AND RINSE YOUR MOUTH OUT, NO CHEWING GUM CANDY OR MINTS.     Take these medicines the morning of surgery with A SIP OF WATER: Atenolol (Tenormin), Gabapentin (Neurontin)                                You may not have any metal on your body including hair pins and              piercings  Do not wear jewelry, make-up, lotions, powders or perfumes, deodorant                         Men may shave face and neck.   Do not bring valuables to the hospital. [place].  Contacts, dentures or bridgework may not be worn into surgery.  Leave suitcase in the car. After surgery it may be brought to your room.     Patients discharged the day of surgery will not be allowed to drive home. IF YOU ARE HAVING SURGERY AND GOING HOME THE SAME DAY, YOU MUST HAVE AN ADULT TO DRIVE YOU HOME AND BE WITH YOU FOR 24 HOURS. YOU MAY GO HOME BY TAXI OR UBER OR ORTHERWISE, BUT AN ADULT MUST ACCOMPANY YOU HOME AND STAY WITH YOU FOR 24 HOURS.  Name and phone number of your driver:spouse-[name]               Please read over the following fact sheets you were given: _____________________________________________________________________   PLEASE USE DIAL SOAP INSTEAD OF CHG TO [facility] WITH!          [facility] - Preparing for Surgery Before surgery, you can play an important role.  Because skin is not sterile, your skin needs to be as free of germs as possible.  You can reduce the number of germs on your skin by  washing with CHG (chlorahexidine gluconate) soap before surgery.  CHG is an antiseptic cleaner which kills germs and bonds with the skin to continue killing germs even after washing. Please DO NOT use if you have an allergy to CHG or antibacterial soaps.  If your skin becomes reddened/irritated stop using the CHG and inform your nurse when you arrive at Short Stay. Do not shave (including legs and underarms) for at least 48 hours prior to the first CHG shower.  You may shave your face/neck. Please follow these instructions carefully:  1.  Shower with CHG Soap the night before surgery and the  morning of Surgery.  2.  If you choose  to wash your hair, wash your hair first as usual with your  normal  shampoo.  3.  After you shampoo, rinse your hair and body thoroughly to remove the  shampoo.                           4.  Use CHG as you would any other liquid soap.  You can apply chg directly  to the skin and wash                       Gently with a scrungie or clean washcloth.  5.  Apply the CHG Soap to your body ONLY FROM THE NECK DOWN.   Do not use on face/ open                           Wound or open sores. Avoid contact with eyes, ears mouth and genitals (private parts).                       Wash face,  Genitals (private parts) with your normal soap.             6.  Wash thoroughly, paying special attention to the area where your surgery  will be performed.  7.  Thoroughly rinse your body with warm water from the neck down.  8.  DO NOT shower/wash with your normal soap after using and rinsing off  the CHG Soap.                9.  Pat yourself dry with a clean towel.            10.  Wear clean pajamas.            11.  Place clean sheets on your bed the night of your first shower and do not  sleep with pets. Day of Surgery : Do not apply any lotions/deodorants the morning of surgery.  Please wear clean clothes to the hospital/surgery center.  FAILURE TO FOLLOW THESE INSTRUCTIONS MAY RESULT IN THE  CANCELLATION OF YOUR SURGERY PATIENT SIGNATURE_________________________________  NURSE SIGNATURE__________________________________  ________________________________________________________________________

## 2018-02-22 NOTE — Progress Notes (Signed)
02/04/2018- noted in Epic-CXR  11/30/2017- noted in Epic-EKG  10/17/2016- noted in Epic-ECHO  08/20/2016- noted in Epic-Stress test

## 2018-02-23 NOTE — Anesthesia Preprocedure Evaluation (Addendum)
Anesthesia Evaluation  Patient identified by MRN, date of birth, ID band Patient awake    Reviewed: Allergy & Precautions, NPO status , Patient's Chart, lab work & pertinent test results  Airway Mallampati: II  TM Distance: >3 FB Neck ROM: Full    Dental  (+) Dental Advisory Given   Pulmonary neg pulmonary ROS, sleep apnea , former smoker,    Pulmonary exam normal breath sounds clear to auscultation       Cardiovascular hypertension, + CAD and + Past MI  Normal cardiovascular exam+ dysrhythmias  Rhythm:Regular Rate:Normal     Neuro/Psych negative neurological ROS  negative psych ROS   GI/Hepatic negative GI ROS, Neg liver ROS,   Endo/Other  negative endocrine ROS  Renal/GU Renal diseasenegative Renal ROS  negative genitourinary   Musculoskeletal negative musculoskeletal ROS (+)   Abdominal   Peds  Hematology negative hematology ROS (+)   Anesthesia Other Findings   Reproductive/Obstetrics negative OB ROS                                                            Anesthesia Evaluation  Patient identified by MRN, date of birth, ID band Patient awake    Reviewed: Allergy & Precautions, NPO status , Patient's Chart, lab work & pertinent test results  Airway Mallampati: II  TM Distance: >3 FB Neck ROM: Full    Dental  (+) Dental Advisory Given, Edentulous Upper, Edentulous Lower   Pulmonary sleep apnea , former smoker,    breath sounds clear to auscultation       Cardiovascular hypertension, Pt. on medications + CAD and + Past MI  + dysrhythmias  Rhythm:Regular Rate:Normal     Neuro/Psych    GI/Hepatic negative GI ROS, Neg liver ROS,   Endo/Other    Renal/GU CRFRenal disease     Musculoskeletal   Abdominal (+) + obese,   Peds  Hematology   Anesthesia Other Findings - HLD  Reproductive/Obstetrics                            Lab  Results  Component Value Date   WBC 6.5 02/22/2018   HGB 12.5 (L) 02/22/2018   HCT 41.5 02/22/2018   MCV 97.6 02/22/2018   PLT 157 02/22/2018   Lab Results  Component Value Date   CREATININE 0.91 02/22/2018   BUN 12 02/22/2018   NA 138 02/22/2018   K 4.4 02/22/2018   CL 103 02/22/2018   CO2 28 02/22/2018   No results found for: INR, PROTIME  Echo: - Left ventricle: The cavity size was mildly dilated. There was   mild concentric hypertrophy. Systolic function was mildly   reduced. 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 Diffuse hypokinesis. Doppler parameters are consistent   with abnormal left ventricular relaxation (grade 1 diastolic   dysfunction). Doppler parameters are consistent with   indeterminate ventricular filling pressure. - Aortic valve: Transvalvular velocity was within the normal range.   There was no stenosis. There was mild regurgitation. - Mitral valve: Transvalvular velocity was within the normal range.   There was no evidence for stenosis. There was trivial   regurgitation. - Left atrium: The atrium was mildly to moderately dilated. - Right ventricle: The cavity  size was normal. Wall thickness was   normal. Systolic function was normal. - Tricuspid valve: There was moderate regurgitation. - Pulmonary arteries: Systolic pressure was within the normal  Anesthesia Physical Anesthesia Plan  ASA: III  Anesthesia Plan: General   Post-op Pain Management:    Induction: Intravenous  PONV Risk Score and Plan: 3 and Ondansetron, Dexamethasone and Treatment may vary due to age or medical condition  Airway Management Planned: Oral ETT  Additional Equipment: None  Intra-op Plan:   Post-operative Plan: Extubation in OR  Informed Consent: I have reviewed the patients History and Physical, chart, labs and discussed the procedure including the risks, benefits and alternatives for the proposed anesthesia with the patient or authorized  representative who has indicated his/her understanding and acceptance.   Dental advisory given  Plan Discussed with: CRNA  Anesthesia Plan Comments:         Anesthesia Quick Evaluation  Anesthesia Physical Anesthesia Plan  ASA: III  Anesthesia Plan: General   Post-op Pain Management:    Induction: Intravenous  PONV Risk Score and Plan: 3 and Ondansetron, Dexamethasone and Treatment may vary due to age or medical condition  Airway Management Planned: LMA  Additional Equipment: None  Intra-op Plan:   Post-operative Plan: Extubation in OR  Informed Consent: I have reviewed the patients History and Physical, chart, labs and discussed the procedure including the risks, benefits and alternatives for the proposed anesthesia with the patient or authorized representative who has indicated his/her understanding and acceptance.     Dental advisory given  Plan Discussed with: CRNA  Anesthesia Plan Comments: (See PST note 02/22/18, Konrad Felix, PA-C)       Anesthesia Quick Evaluation                                   Anesthesia Evaluation  Patient identified by MRN, date of birth, ID band Patient awake    Reviewed: Allergy & Precautions, NPO status , Patient's Chart, lab work & pertinent test results  Airway Mallampati: II  TM Distance: >3 FB Neck ROM: Full    Dental  (+) Dental Advisory Given, Edentulous Upper, Edentulous Lower   Pulmonary sleep apnea , former smoker,    breath sounds clear to auscultation       Cardiovascular hypertension, Pt. on medications + CAD and + Past MI  + dysrhythmias  Rhythm:Regular Rate:Normal     Neuro/Psych    GI/Hepatic negative GI ROS, Neg liver ROS,   Endo/Other    Renal/GU CRFRenal disease     Musculoskeletal   Abdominal (+) + obese,   Peds  Hematology   Anesthesia Other Findings - HLD  Reproductive/Obstetrics                            Lab Results  Component  Value Date   WBC 6.5 02/22/2018   HGB 12.5 (L) 02/22/2018   HCT 41.5 02/22/2018   MCV 97.6 02/22/2018   PLT 157 02/22/2018   Lab Results  Component Value Date   CREATININE 0.91 02/22/2018   BUN 12 02/22/2018   NA 138 02/22/2018   K 4.4 02/22/2018   CL 103 02/22/2018   CO2 28 02/22/2018   No results found for: INR, PROTIME  Echo: - Left ventricle: The cavity size was mildly dilated. There was   mild concentric hypertrophy. Systolic function was mildly   reduced.  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 Diffuse hypokinesis. Doppler parameters are consistent   with abnormal left ventricular relaxation (grade 1 diastolic   dysfunction). Doppler parameters are consistent with   indeterminate ventricular filling pressure. - Aortic valve: Transvalvular velocity was within the normal range.   There was no stenosis. There was mild regurgitation. - Mitral valve: Transvalvular velocity was within the normal range.   There was no evidence for stenosis. There was trivial   regurgitation. - Left atrium: The atrium was mildly to moderately dilated. - Right ventricle: The cavity size was normal. Wall thickness was   normal. Systolic function was normal. - Tricuspid valve: There was moderate regurgitation. - Pulmonary arteries: Systolic pressure was within the normal  Anesthesia Physical Anesthesia Plan  ASA: III  Anesthesia Plan: General   Post-op Pain Management:    Induction: Intravenous  PONV Risk Score and Plan: 3 and Ondansetron, Dexamethasone and Treatment may vary due to age or medical condition  Airway Management Planned: Oral ETT  Additional Equipment: None  Intra-op Plan:   Post-operative Plan: Extubation in OR  Informed Consent: I have reviewed the patients History and Physical, chart, labs and discussed the procedure including the risks, benefits and alternatives for the proposed anesthesia with the patient or authorized representative who  has indicated his/her understanding and acceptance.   Dental advisory given  Plan Discussed with: CRNA  Anesthesia Plan Comments:         Anesthesia Quick Evaluation

## 2018-02-23 NOTE — Progress Notes (Signed)
Anesthesia Chart Review   Case:  [id number] Date/Time:  02/25/18 1125   Procedure:  RIGHT URETEROSCOPY/HOLMIUM LASER/ STONE REMOVAL  STENT PLACEMENT (Right )   Anesthesia type:  General   Pre-op diagnosis:  RIGHT KIDNEY STONE   [facility]:  [facility]   Surgeon:  Joseph Hughs, MD      DISCUSSION: 78 yo former smoker (quit 08/25/88) with h/o CAD (h/o MI 17 years ago), HTN, OSA w/CPAP, CKD, HLD, right kidney stone scheduled for above procedure 02/25/18 with Dr. Louis Bryan.   Cardiac clearance received 02/09/18.  Per Joseph Lofts, PA-C, " Joseph Bryan was last seen on 11/30/2017 by Dr. Claiborne Bryan.  Since that day, Joseph Bryan has done well from a cardiac standpoint. Still somewhat limited in activity due to leg pain and slow healing from back surgery. He reports being able to complete >4 METs without anginal complaints. Therefore, based on ACC/AHA guidelines, the patient would be at acceptable risk for the planned procedure without further cardiovascular testing."  Pt can proceed with planned procedure barring acute status change.   VS: BP (!) 147/67   Pulse (!) 57   Temp 36.5 C (Oral)   Resp 18   Ht 5\' 11"  (1.803 m)   Wt 125.2 kg   SpO2 95%   BMI 38.49 kg/m   PROVIDERS: Joseph Downing, MD is PCP   Joseph Majestic, MD is Cardiologist  LABS: Labs reviewed: Acceptable for surgery. (all labs ordered are listed, but only abnormal results are displayed)  Labs Reviewed  CBC - Abnormal; Notable for the following components:      Result Value   Hemoglobin 12.5 (*)    All other components within normal limits  BASIC METABOLIC PANEL     IMAGES: Chest Xray 02/04/2018 FINDINGS: Heart size remains at the upper limits of normal. Prior CABG. Small pulmonary nodule in the lateral right upper lobe is unchanged, consistent with benign etiology. No evidence of acute pulmonary infiltrate or pleural effusion.  IMPRESSION: Stable borderline cardiomegaly.  No  active lung disease.  EKG: 11/30/17 Rate 64 bpm Unusual P axis, possible ectopic atrial rhythm Left axis deviation  Right bundle branch block  Abnormal ECG  CV: Echo 10/17/2016 Study Conclusions  - Left ventricle: The cavity size was mildly dilated. There was   mild concentric hypertrophy. Systolic function was mildly   reduced. The estimated ejection fraction was in the range of 45%   to 50%. Diffuse hypokinesis. Doppler parameters are consistent   with abnormal left ventricular relaxation (grade 1 diastolic   dysfunction). Doppler parameters are consistent with   indeterminate ventricular filling pressure. - Aortic valve: Transvalvular velocity was within the normal range.   There was no stenosis. There was mild regurgitation. - Mitral valve: Transvalvular velocity was within the normal range.   There was no evidence for stenosis. There was trivial   regurgitation. - Left atrium: The atrium was mildly to moderately dilated. - Right ventricle: The cavity size was normal. Wall thickness was   normal. Systolic function was normal. - Tricuspid valve: There was moderate regurgitation. - Pulmonary arteries: Systolic pressure was within the normal   range. PA peak pressure: 35 mm Hg (S).  Stress Test 08/20/16  The left ventricular ejection fraction is moderately decreased (30-44%).  Nuclear stress EF: 36%.  There was no ST segment deviation noted during stress.  There is a medium defect of moderate severity present in the apical anterior, apical septal and apex  location. The defect is non-reversible. This is consistent with infarct. No ischemia noted.  This is an intermediate risk study.  There is evidence of transient ischemic dilatation with a TID of 1.27 which can be seen in multivessel CAD. Past Medical History:  Diagnosis Date  . 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 Coronary artery  disease   . Glaucoma   . Heart disease   . History of kidney stones   . Hyperlipidemia   . Hypertension   . Liver disease    pt denies  . Lung disease    pt denies  . Myocardial infarction ([place]) 17 yrs ago  . Neuropathy    feet  . Sleep apnea with use of continuous positive airway pressure (CPAP)    uses CPAP    Past Surgical History:  Procedure Laterality Date  . APPENDECTOMY  2014  . COLON RESECTION N/A 08/27/2012   Procedure: Diagnostic laparoscopy; Ileocecectomy;  Surgeon: Joseph Hook, DO;  Location: [facility];  Service: General;  Laterality: N/A;  . COLONOSCOPY WITH PROPOFOL N/A 04/09/2015   Procedure: COLONOSCOPY WITH PROPOFOL;  Surgeon: Joseph Fair, MD;  Location: [facility];  Service: Endoscopy;  Laterality: N/A;  . CORONARY ARTERY BYPASS GRAFT  17 yrs ago   x 5  . LUMBAR LAMINECTOMY/DECOMPRESSION MICRODISCECTOMY N/A 10/19/2015   Procedure: Lumbar three to four LAMINECTOMY/FORAMINOTOMY;  Surgeon: Joseph Levine, MD;  Location: [place];  Service: Neurosurgery;  Laterality: N/A;  . LUNG SURGERY  yrs ago   right and left lungs - "had air pockets"    MEDICATIONS: . atenolol (TENORMIN) 50 MG tablet  . BEE POLLEN PO  . gabapentin (NEURONTIN) 300 MG capsule  . Glucosamine-Chondroitin (OSTEO BI-FLEX REGULAR STRENGTH PO)  . lisinopril (PRINIVIL,ZESTRIL) 20 MG tablet  . metolazone (ZAROXOLYN) 2.5 MG tablet  . potassium chloride SA (K-DUR,KLOR-CON) 20 MEQ tablet  . pyridOXINE (VITAMIN B-6) 100 MG tablet  . rosuvastatin (CRESTOR) 20 MG tablet  . torsemide (DEMADEX) 20 MG tablet  . vitamin B-12 (CYANOCOBALAMIN) 1000 MCG tablet   No current facility-administered medications for this encounter.     Joseph [facility] Pre-Surgical Testing [phone] 02/23/18 11:44 AM

## 2018-02-24 MED ORDER — DEXTROSE 5 % IV SOLN
3.0000 g | INTRAVENOUS | Status: AC
  Administered 2018-02-25: 3 g via INTRAVENOUS
  Filled 2018-02-24: qty 3

## 2018-02-25 ENCOUNTER — Encounter (HOSPITAL_COMMUNITY)
Admission: RE | Disposition: A | Discharge status: Home / Self Care | Payer: Self-pay | Source: Home / Self Care | Attending: Urology

## 2018-02-25 ENCOUNTER — Ambulatory Visit (HOSPITAL_COMMUNITY): Payer: Medicare Other | Admitting: Certified Registered Nurse Anesthetist

## 2018-02-25 ENCOUNTER — Encounter (HOSPITAL_COMMUNITY): Payer: Self-pay

## 2018-02-25 ENCOUNTER — Ambulatory Visit (HOSPITAL_COMMUNITY)
Admission: RE | Admit: 2018-02-25 | Discharge: 2018-02-25 | Disposition: A | Discharge status: Home / Self Care | Payer: Medicare Other | Attending: Urology | Admitting: Urology

## 2018-02-25 ENCOUNTER — Ambulatory Visit (HOSPITAL_COMMUNITY): Payer: Medicare Other

## 2018-02-25 ENCOUNTER — Ambulatory Visit (HOSPITAL_COMMUNITY): Payer: Medicare Other | Admitting: Physician Assistant

## 2018-02-25 DIAGNOSIS — N202 Calculus of kidney with calculus of ureter: Secondary | ICD-10-CM | POA: Diagnosis not present

## 2018-02-25 DIAGNOSIS — Z79899 Other long term (current) drug therapy: Secondary | ICD-10-CM | POA: Insufficient documentation

## 2018-02-25 DIAGNOSIS — Z6838 Body mass index (BMI) 38.0-38.9, adult: Secondary | ICD-10-CM | POA: Insufficient documentation

## 2018-02-25 DIAGNOSIS — N400 Enlarged prostate without lower urinary tract symptoms: Secondary | ICD-10-CM | POA: Diagnosis not present

## 2018-02-25 DIAGNOSIS — R3129 Other microscopic hematuria: Secondary | ICD-10-CM | POA: Insufficient documentation

## 2018-02-25 DIAGNOSIS — I10 Essential (primary) hypertension: Secondary | ICD-10-CM | POA: Insufficient documentation

## 2018-02-25 DIAGNOSIS — Z8249 Family history of ischemic heart disease and other diseases of the circulatory system: Secondary | ICD-10-CM | POA: Insufficient documentation

## 2018-02-25 DIAGNOSIS — Z7982 Long term (current) use of aspirin: Secondary | ICD-10-CM | POA: Insufficient documentation

## 2018-02-25 DIAGNOSIS — Z87891 Personal history of nicotine dependence: Secondary | ICD-10-CM | POA: Insufficient documentation

## 2018-02-25 DIAGNOSIS — I252 Old myocardial infarction: Secondary | ICD-10-CM | POA: Insufficient documentation

## 2018-02-25 DIAGNOSIS — G4730 Sleep apnea, unspecified: Secondary | ICD-10-CM | POA: Insufficient documentation

## 2018-02-25 DIAGNOSIS — I2510 Atherosclerotic heart disease of native coronary artery without angina pectoris: Secondary | ICD-10-CM | POA: Insufficient documentation

## 2018-02-25 DIAGNOSIS — Q600 Renal agenesis, unilateral: Secondary | ICD-10-CM | POA: Diagnosis not present

## 2018-02-25 DIAGNOSIS — N200 Calculus of kidney: Secondary | ICD-10-CM

## 2018-02-25 DIAGNOSIS — E669 Obesity, unspecified: Secondary | ICD-10-CM | POA: Insufficient documentation

## 2018-02-25 HISTORY — PX: CYSTOSCOPY/URETEROSCOPY/HOLMIUM LASER/STENT PLACEMENT: SHX6546

## 2018-02-25 SURGERY — CYSTOSCOPY/URETEROSCOPY/HOLMIUM LASER/STENT PLACEMENT
Anesthesia: General | Site: Ureter | Laterality: Right

## 2018-02-25 MED ORDER — FENTANYL CITRATE (PF) 100 MCG/2ML IJ SOLN
INTRAMUSCULAR | Status: DC | PRN
  Administered 2018-02-25 (×4): 25 ug via INTRAVENOUS

## 2018-02-25 MED ORDER — FENTANYL CITRATE (PF) 100 MCG/2ML IJ SOLN
INTRAMUSCULAR | Status: AC
  Filled 2018-02-25: qty 2

## 2018-02-25 MED ORDER — MEPERIDINE HCL 50 MG/ML IJ SOLN: 6.2500 mg | INTRAMUSCULAR | Status: DC | PRN

## 2018-02-25 MED ORDER — SODIUM CHLORIDE 0.9 % IR SOLN
Status: DC | PRN
  Administered 2018-02-25: 3000 mL

## 2018-02-25 MED ORDER — BELLADONNA ALKALOIDS-OPIUM 16.2-60 MG RE SUPP
RECTAL | Status: DC | PRN
  Administered 2018-02-25: 1 via RECTAL

## 2018-02-25 MED ORDER — PHENAZOPYRIDINE HCL 200 MG PO TABS: 200.0000 mg | ORAL_TABLET | Freq: Three times a day (TID) | ORAL | 0 refills | Status: DC | PRN

## 2018-02-25 MED ORDER — LIDOCAINE 2% (20 MG/ML) 5 ML SYRINGE
INTRAMUSCULAR | Status: AC
  Filled 2018-02-25: qty 5

## 2018-02-25 MED ORDER — ONDANSETRON HCL 4 MG/2ML IJ SOLN
INTRAMUSCULAR | Status: DC | PRN
  Administered 2018-02-25: 4 mg via INTRAVENOUS

## 2018-02-25 MED ORDER — ONDANSETRON HCL 4 MG/2ML IJ SOLN
INTRAMUSCULAR | Status: AC
  Filled 2018-02-25: qty 2

## 2018-02-25 MED ORDER — LACTATED RINGERS IV SOLN
INTRAVENOUS | Status: DC
  Administered 2018-02-25: 10:00:00 via INTRAVENOUS

## 2018-02-25 MED ORDER — PROMETHAZINE HCL 25 MG/ML IJ SOLN: 6.2500 mg | INTRAMUSCULAR | Status: DC | PRN

## 2018-02-25 MED ORDER — PROPOFOL 10 MG/ML IV BOLUS
INTRAVENOUS | Status: AC
  Filled 2018-02-25: qty 20

## 2018-02-25 MED ORDER — EPHEDRINE 5 MG/ML INJ
INTRAVENOUS | Status: AC
  Filled 2018-02-25: qty 10

## 2018-02-25 MED ORDER — SODIUM CHLORIDE 0.9 % IV SOLN
INTRAVENOUS | Status: DC | PRN
  Administered 2018-02-25: 8 mL

## 2018-02-25 MED ORDER — FENTANYL CITRATE (PF) 100 MCG/2ML IJ SOLN: 25.0000 ug | INTRAMUSCULAR | Status: DC | PRN

## 2018-02-25 MED ORDER — LIDOCAINE 2% (20 MG/ML) 5 ML SYRINGE
INTRAMUSCULAR | Status: DC | PRN
  Administered 2018-02-25: 80 mg via INTRAVENOUS

## 2018-02-25 MED ORDER — EPHEDRINE SULFATE-NACL 50-0.9 MG/10ML-% IV SOSY
PREFILLED_SYRINGE | INTRAVENOUS | Status: DC | PRN
  Administered 2018-02-25 (×4): 5 mg via INTRAVENOUS

## 2018-02-25 MED ORDER — PROPOFOL 10 MG/ML IV BOLUS
INTRAVENOUS | Status: DC | PRN
  Administered 2018-02-25: 150 mg via INTRAVENOUS

## 2018-02-25 MED ORDER — BELLADONNA ALKALOIDS-OPIUM 16.2-30 MG RE SUPP
RECTAL | Status: AC
  Filled 2018-02-25: qty 1

## 2018-02-25 MED ORDER — CIPROFLOXACIN HCL 500 MG PO TABS: 500.0000 mg | ORAL_TABLET | Freq: Once | ORAL | 0 refills | Status: AC

## 2018-02-25 MED ORDER — TRAMADOL HCL 50 MG PO TABS: 50.0000 mg | ORAL_TABLET | Freq: Four times a day (QID) | ORAL | 0 refills | Status: DC | PRN

## 2018-02-25 SURGICAL SUPPLY — 21 items
BAG URO CATCHER STRL LF (MISCELLANEOUS) ×4 IMPLANT
BASKET ZERO TIP NITINOL 2.4FR (BASKET) IMPLANT
CATH DILATION NEPHR BALL 15X10 (CATHETERS) ×4 IMPLANT
CATH URET 5FR 28IN OPEN ENDED (CATHETERS) ×4 IMPLANT
CLOTH BEACON ORANGE TIMEOUT ST (SAFETY) ×4 IMPLANT
COVER WAND RF STERILE (DRAPES) IMPLANT
EXTRACTOR STONE 1.7FRX115CM (UROLOGICAL SUPPLIES) ×4 IMPLANT
FIBER LASER TRAC TIP (UROLOGICAL SUPPLIES) ×4 IMPLANT
GLOVE BIOGEL M STRL SZ7.5 (GLOVE) ×4 IMPLANT
GOWN STRL REUS W/TWL XL LVL3 (GOWN DISPOSABLE) ×4 IMPLANT
GUIDEWIRE ANG ZIPWIRE 038X150 (WIRE) IMPLANT
GUIDEWIRE STR DUAL SENSOR (WIRE) ×8 IMPLANT
MANIFOLD NEPTUNE II (INSTRUMENTS) ×4 IMPLANT
PACK CYSTO (CUSTOM PROCEDURE TRAY) ×4 IMPLANT
SHEATH URETERAL 12FRX28CM (UROLOGICAL SUPPLIES) IMPLANT
SHEATH URETERAL 12FRX35CM (MISCELLANEOUS) ×4 IMPLANT
STENT CONTOUR 6FRX28X.038 (STENTS) ×4 IMPLANT
TUBING CONNECTING 10 (TUBING) ×3 IMPLANT
TUBING CONNECTING 10' (TUBING) ×1
TUBING UROLOGY SET (TUBING) ×4 IMPLANT
WIRE COONS/BENSON .038X145CM (WIRE) IMPLANT

## 2018-02-25 NOTE — H&P (Signed)
Microscopic hematuria evaluation  HPI: Joseph Bryan is a 78 year-old male established patient who is here for further evaluation of microscopic hematuria.  The patient was last seen January 8th.   The patient had cystoscopy at their last visit. The cystoscopic evaluation revealed Friable prostatic mucosa.   He has had a CT scan within the last 12 months.   The patient denies any progression of his voiding symptoms. The patient denies any new or recent onset of back/flank pain or suprapubic pain.   The patient does not have a history of recurrent UTIs. They do not have a history of kidney stones. He has not been exposed to occupational hazards that may increase their risks for developing cancer. The patient has no family history of GU malignancy.   The patient has not seen noted blood in his urine since the last visit.   The patient underwent a CT scan on 01/20/2018 which demonstrated a 4 mm stone at the right UPJ as well as an 8 mm stone in the right upper pole. The HF units are roughly 200-250 under each stone suggesting uric acid etiology.     ALLERGIES: None   MEDICATIONS: Crestor  Lisinopril  Atenolol  Bee Caps  Metolazone  Potassium Chloride  Torsemide  Vitamin B12  Vitamin B6  Vitamin C     GU PSH: Cystoscopy - 01/19/2018 Locm 300-399Mg /Ml Iodine,1Ml - 01/20/2018      PSH Notes: [place], heart surgery (2019), appendectomy (2015)   NON-GU PSH: None   GU PMH: Gross hematuria - 01/19/2018      PMH Notes: Bleeding disorder, heart disease   NON-GU PMH: DVT, History Glaucoma Myocardial Infarction Sleep Apnea    FAMILY HISTORY: 2 sons - Son Death - Father, Mother Heart Attack - Father   SOCIAL HISTORY: Marital Status: Married Current Smoking Status: Patient does not smoke anymore. Has not smoked since 01/14/1983. Smoked for 29 years. Smoked 2 packs per day.   Tobacco Use Assessment Completed: Used Tobacco in last 30 days? Has never drank.  Does not drink  caffeine. Patient's occupation is/was Retired.    REVIEW OF SYSTEMS:    GU Review Male:   Patient denies frequent urination, hard to postpone urination, burning/ pain with urination, get up at night to urinate, leakage of urine, stream starts and stops, trouble starting your stream, have to strain to urinate , erection problems, and penile pain.  Gastrointestinal (Upper):   Patient denies nausea, vomiting, and indigestion/ heartburn.  Gastrointestinal (Lower):   Patient denies constipation and diarrhea.  Constitutional:   Patient denies fever, night sweats, weight loss, and fatigue.  Skin:   Patient denies skin rash/ lesion and itching.  Eyes:   Patient denies blurred vision and double vision.  Ears/ Nose/ Throat:   Patient denies sore throat and sinus problems.  Hematologic/Lymphatic:   Patient denies swollen glands and easy bruising.  Cardiovascular:   Patient denies leg swelling and chest pains.  Respiratory:   Patient reports cough. Patient denies shortness of breath.  Endocrine:   Patient denies excessive thirst.  Musculoskeletal:   Patient reports back pain. Patient denies joint pain.  Neurological:   Patient denies headaches and dizziness.  Psychologic:   Patient denies depression and anxiety.   VITAL SIGNS:      02/01/2018 11:14 AM  Weight 262 lb / 118.84 kg  Height 73 in / 185.42 cm  Heart Rate 62 /min  BMI 34.6 kg/m   MULTI-SYSTEM PHYSICAL EXAMINATION:  Constitutional: Obese. No physical deformities. Normally developed. Good grooming.   Respiratory: Normal breath sounds. No labored breathing, no use of accessory muscles.   Cardiovascular: Regular rate and rhythm. No murmur, no gallop. Normal temperature, normal extremity pulses, no swelling, no varicosities.      PAST DATA REVIEWED:  Source Of History:  Patient  Records Review:   Previous Doctor Records, Previous Patient Records, POC Tool  X-Ray Review: C.T. Abdomen/Pelvis: Reviewed Films. Discussed With Patient.      PROCEDURES:          Urinalysis Dipstick Dipstick Cont'd  Color: Yellow Bilirubin: Neg mg/dL  Appearance: Clear Ketones: Neg mg/dL  Specific Gravity: 1.025 Blood: Neg ery/uL  pH: 6.5 Protein: Trace mg/dL  Glucose: Neg mg/dL Urobilinogen: 0.2 mg/dL    Nitrites: Neg    Leukocyte Esterase: Neg leu/uL    ASSESSMENT:      ICD-10 Details  1 GU:   Microscopic hematuria - R31.21    PLAN:           Schedule Return Visit/Planned Activity: ASAP - Schedule Surgery          Document Letter(s):  Created for Patient: Clinical Summary         Notes:   The patient has the stone at the right UPJ as well as a nonobstructing stone in the kidney. He has a solitary kidney and as such I recommended that they strongly consider removal of the stone. I discussed ureteroscopy with a him to remove but the stone at the UPJ as well as a nonobstructing stone. I think given that he only has a solitary kidney this is the safest procedure he if it this point currently is otherwise asymptomatic. The patient understands the lab of a stent for short period of time. We discussed the risk of the operation as well, and he has had a heart attack within the last year, so will have to be done at the main hospital most likely. Will plan to get his primary care doctor to provide clearance for him. He can stay on his aspirin if necessary.

## 2018-02-25 NOTE — Transfer of Care (Signed)
Immediate Anesthesia Transfer of Care Note  Patient: Joseph Bryan  Procedure(s) Performed: RIGHT URETEROSCOPY/HOLMIUM LASER/ STONE REMOVAL  STENT PLACEMENT (Right Ureter) URETERAL DILITATION (N/A Ureter)  Patient Location: PACU  Anesthesia Type:General  Level of Consciousness: awake, alert  and oriented  Airway & Oxygen Therapy: Patient Spontanous Breathing and Patient connected to face mask oxygen  Post-op Assessment: Report given to RN and Post -op Vital signs reviewed and stable  Post vital signs: Reviewed and stable  Last Vitals:  Vitals Value Taken Time  BP 137/68 02/25/2018  1:13 PM  Temp    Pulse 63 02/25/2018  1:15 PM  Resp 15 02/25/2018  1:15 PM  SpO2 100 % 02/25/2018  1:15 PM  Vitals shown include unvalidated device data.  Last Pain:  Vitals:   02/25/18 1015  TempSrc: Oral         Complications: No apparent anesthesia complications

## 2018-02-25 NOTE — Anesthesia Procedure Notes (Signed)
Procedure Name: LMA Insertion Date/Time: 02/25/2018 11:53 AM Performed by: Maxwell Caul, CRNA Pre-anesthesia Checklist: Patient identified, Emergency Drugs available, Suction available and Patient being monitored Patient Re-evaluated:Patient Re-evaluated prior to induction Oxygen Delivery Method: Circle system utilized Preoxygenation: Pre-oxygenation with 100% oxygen Induction Type: IV induction LMA: LMA inserted LMA Size: 4.0 Number of attempts: 1 Placement Confirmation: positive ETCO2 and breath sounds checked- equal and bilateral Tube secured with: Tape Dental Injury: Teeth and Oropharynx as per pre-operative assessment

## 2018-02-25 NOTE — OR Nursing (Signed)
RIGHT URETERAL STONES TAKEN PER DR.HERRICK TO PATIENT

## 2018-02-25 NOTE — Discharge Instructions (Signed)
Ureteral Stent Implantation, Care After °Refer to this sheet in the next few weeks. These instructions provide you with information about caring for yourself after your procedure. Your health care provider may also give you more specific instructions. Your treatment has been planned according to current medical practices, but problems sometimes occur. Call your health care provider if you have any problems or questions after your procedure. °What can I expect after the procedure? °After the procedure, it is common to have: °· Nausea. °· Mild pain when you urinate. You may feel this pain in your lower back or lower abdomen. Pain should stop within a few minutes after you urinate. This may last for up to 1 week. °· A small amount of blood in your urine for several days. °Follow these instructions at home: ° °Medicines °· Take over-the-counter and prescription medicines only as told by your health care provider. °· If you were prescribed an antibiotic medicine, take it as told by your health care provider. Do not stop taking the antibiotic even if you start to feel better. °· Do not drive for 24 hours if you received a sedative. °· Do not drive or operate heavy machinery while taking prescription pain medicines. °Activity °· Return to your normal activities as told by your health care provider. Ask your health care provider what activities are safe for you. °· Do not lift anything that is heavier than 10 lb (4.5 kg). Follow this limit for 1 week after your procedure, or for as long as told by your health care provider. °General instructions °· Watch for any blood in your urine. Call your health care provider if the amount of blood in your urine increases. °· If you have a catheter: °? Follow instructions from your health care provider about taking care of your catheter and collection bag. °? Do not take baths, swim, or use a hot tub until your health care provider approves. °· Drink enough fluid to keep your urine  clear or pale yellow. °· Keep all follow-up visits as told by your health care provider. This is important. °Contact a health care provider if: °· You have pain that gets worse or does not get better with medicine, especially pain when you urinate. °· You have difficulty urinating. °· You feel nauseous or you vomit repeatedly during a period of more than 2 days after the procedure. °Get help right away if: °· Your urine is dark red or has blood clots in it. °· You are leaking urine (have incontinence). °· The end of the stent comes out of your urethra. °· You cannot urinate. °· You have sudden, sharp, or severe pain in your abdomen or lower back. °· You have a fever. °This information is not intended to replace advice given to you by your health care provider. Make sure you discuss any questions you have with your health care provider. °Document Released: 09/01/2012 Document Revised: 06/07/2015 Document Reviewed: 07/14/2014 °Elsevier Interactive Patient Education © 2019 [employer] ° °

## 2018-02-25 NOTE — Op Note (Signed)
Preoperative diagnosis: right ureteral calculus  Postoperative diagnosis: right ureteral calculus  Procedure:  1. Cystoscopy 2. right ureteroscopy and stone removal 3. Ureteroscopic laser lithotripsy 4. right 1F x 28 cm ureteral stent placement  5. right retrograde pyelography with interpretation  Surgeon: Ardis Hughs, MD  Anesthesia: General  Complications: None  Intraoperative findings: #1.  Right retrograde pyelography demonstrated a filling defect within the right ureter consistent with the patient's known calculus without other abnormalities. #2.  The ureter was narrow in caliber and I was unable to get a rigid scope past the distal ureter.  I thus used a balloon dilator to dilate the ureter all the way up to the UVJ.  I did need to use the inner portion of the access sheath for a tight narrow aspect of the ureter at the very distal part just proximal to the UVJ.  Once the inner portion of the sheath was used the remaining aspect of this area dilated nicely with the balloon. #3: 2 8 cm x 6 French double-J stent was left in the patient's right ureter.  EBL: Minimal  Specimens: 1. right renal calculus  Disposition of specimens: [facility] for stone analysis  Indication: Joseph Bryan is a 78 y.o.   patient with a hematuria and a large right renal stone, a smaller stone at the UVJ and a solitary kidney. After reviewing the management options for treatment, the patient elected to proceed with the above surgical procedure(s). We have discussed the potential benefits and risks of the procedure, side effects of the proposed treatment, the likelihood of the patient achieving the goals of the procedure, and any potential problems that might occur during the procedure or recuperation. Informed consent has been obtained.   Description of procedure:  The patient was taken to the operating room and general anesthesia was induced.  The patient was placed in the  dorsal lithotomy position, prepped and draped in the usual sterile fashion, and preoperative antibiotics were administered. A preoperative time-out was performed.   Cystourethroscopy was performed.  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 The bladder was then systematically examined in its entirety. There was no evidence for any bladder tumors, stones, or other mucosal pathology.    Attention then turned to the right ureteral orifice and a ureteral catheter was used to intubate the ureteral orifice.  Omnipaque contrast was injected through the ureteral catheter and a retrograde pyelogram was performed with findings as dictated above.  A 0.38 sensor guidewire was then advanced up the right ureter into the renal pelvis under fluoroscopic guidance. The 6 Fr semirigid ureteroscope was then advanced into the ureter.  I was unable to advance beyond the distal ureter with gentle pressure and as such I advanced a second wire and used a balloon dilator to dilate this segment.  Again, the small area just proximal to the UVJ was refractory to the balloon dilator and as such I use the inner portion of an access sheath and force it beyond this area.  I then replaced the balloon dilator was able to inflate the balloon dilate the segment.  I then segmentally dilated the ureter all the way up to the UPJ.  I advanced a 12/14 French ureteral access sheath up into the right ureter and into the proximal ureter.  The inner portion and the wire were removed.  I then used the flexible ureteroscope to navigate into the patient's right renal pelvis and performed pyeloscopy.  Both stones were encountered in the upper pole.  Using a 200 m laser fiber the stones were fragmented into small pieces.  I then used an engage basket to remove all stone fragments.  I then performed pyeloscopy again under fluoroscopy to ensure that all calyces and areas of the kidney had been  carefully inspected.  There were no remaining stones.  I then slowly backed out the ureteroscope removing the access she simultaneously noting no significant ureteral trauma.  There was some areas of irritation from the dilation, but there is no perforation or significant abnormality.  The wire was then backloaded through the cystoscope and a ureteral stent was advance over the wire using Seldinger technique.  The stent was positioned appropriately under fluoroscopic and cystoscopic guidance.  The wire was then removed with an adequate stent curl noted in the renal pelvis as well as in the bladder.  The bladder was then emptied and the procedure ended.  The patient appeared to tolerate the procedure well and without complications.  The patient was able to be awakened and transferred to the recovery unit in satisfactory condition.   Disposition: The tether of the stent was left on and secured to the ventral aspect of the patient's penis. Instructions for removing the stent have been provided to the patient. The patient has been scheduled for followup in 6 weeks with a renal ultrasound.

## 2018-02-26 ENCOUNTER — Encounter (HOSPITAL_COMMUNITY): Payer: Self-pay | Admitting: Urology

## 2018-02-27 NOTE — Anesthesia Postprocedure Evaluation (Signed)
Anesthesia Post Note  Patient: Joseph Bryan  Procedure(s) Performed: RIGHT URETEROSCOPY/HOLMIUM LASER/ STONE REMOVAL  STENT PLACEMENT (Right Ureter) URETERAL DILITATION (N/A Ureter)     Patient location during evaluation: PACU Anesthesia Type: General Level of consciousness: sedated and patient cooperative Pain management: pain level controlled Vital Signs Assessment: post-procedure vital signs reviewed and stable Respiratory status: spontaneous breathing Cardiovascular status: stable Anesthetic complications: no    Last Vitals:  Vitals:   02/25/18 1418 02/25/18 1450  BP: (!) 144/103 (!) 150/69  Pulse: 64 63  Resp: 16 16  Temp: 36.6 C 36.5 C  SpO2: 94% 94%    Last Pain:  Vitals:   02/26/18 1024  TempSrc:   PainSc: 3    Pain Goal:                   Nolon Nations

## 2018-05-29 ENCOUNTER — Other Ambulatory Visit: Payer: Self-pay | Admitting: Cardiovascular Disease

## 2018-06-14 ENCOUNTER — Telehealth: Payer: Self-pay | Admitting: Cardiovascular Disease

## 2018-06-14 NOTE — Telephone Encounter (Signed)
Mychart pending, no smartphone, consent (verbal), pre reg complete 06/14/18 AF °

## 2018-06-16 ENCOUNTER — Encounter: Payer: Self-pay | Admitting: Cardiovascular Disease

## 2018-06-16 ENCOUNTER — Telehealth (INDEPENDENT_AMBULATORY_CARE_PROVIDER_SITE_OTHER): Payer: Medicare Other | Admitting: Cardiovascular Disease

## 2018-06-16 VITALS — BP 120/72 | HR 62 | Ht 73.0 in | Wt 280.0 lb

## 2018-06-16 DIAGNOSIS — E785 Hyperlipidemia, unspecified: Secondary | ICD-10-CM

## 2018-06-16 DIAGNOSIS — G4733 Obstructive sleep apnea (adult) (pediatric): Secondary | ICD-10-CM | POA: Diagnosis not present

## 2018-06-16 DIAGNOSIS — I2510 Atherosclerotic heart disease of native coronary artery without angina pectoris: Secondary | ICD-10-CM | POA: Diagnosis not present

## 2018-06-16 DIAGNOSIS — Z951 Presence of aortocoronary bypass graft: Secondary | ICD-10-CM

## 2018-06-16 DIAGNOSIS — R600 Localized edema: Secondary | ICD-10-CM

## 2018-06-16 DIAGNOSIS — I2583 Coronary atherosclerosis due to lipid rich plaque: Secondary | ICD-10-CM

## 2018-06-16 DIAGNOSIS — I10 Essential (primary) hypertension: Secondary | ICD-10-CM

## 2018-06-16 MED ORDER — METOLAZONE 2.5 MG PO TABS: 2.5000 mg | ORAL_TABLET | ORAL | Status: DC

## 2018-06-16 NOTE — Patient Instructions (Addendum)

## 2018-06-16 NOTE — Progress Notes (Signed)
Virtual Visit via Telephone Note   This visit type was conducted due to national recommendations for restrictions regarding the COVID-19 Pandemic (e.g. social distancing) in an effort to limit this patient's exposure and mitigate transmission in our community.  Due to his co-morbid illnesses, this patient is at least at moderate risk for complications without adequate follow up.  This format is felt to be most appropriate for this patient at this time.  The patient did not have access to video technology/had technical difficulties with video requiring transitioning to audio format only (telephone).  All issues noted in this document were discussed and addressed.  No physical exam could be performed with this format.  Please refer to the patient's chart for his  consent to telehealth for [facility].   Date:  06/16/2018   ID:  Joseph Bryan, DOB 05-Jul-1940, MRN [id number]  Patient Location: Home Provider Location: Home  PCP:  Leonard Downing, MD  Cardiologist:  Shelva Majestic, MD  Electrophysiologist:  None   Evaluation Performed:  Follow-Up Visit  Chief Complaint: 71-month follow-up cardiology and sleep evaluation  History of Present Illness:    Joseph Bryan is a 78 y.o. male who has CAD and suffered an anterior wall myocardial infarction in November 1998. He underwent primary PTCA of the totally occluded proximal LAD done by me. Due to the concomitant high-grade distal left main stenosis with ostial encroachment to a circumflex vessel following stabilization from his initial intervention and significant myocardial salvage he underwent elective CABG surgery (LIMA to LAD, vein to diagonal, vein to third marginal vessel). He did develop postoperative DVT for which he did require Coumadin therapy. His last catheterization in 2010 showed patent grafts. He has a history of hyperlipidemia also peripheral neuropathy. He was admitted to [facility] with abdominal pain and I saw him  for preoperative preoperative evaluation.  A preoperative 2-D echo Doppler study showed normal systolic function with mild/moderate tricuspid regurgitation. Mild pulmonary hypertension with a PA pressure 33 mm. He was found to have colonic ischemia with presumed appendix inflammation.  From a cardiac standpoint, he ultimately tolerated his abdominal surgery where he was found to have colonic ischemia and underwent laparoscopic ileo-colectomy by Dr. [name].   A nuclear perfusion which on 11/16/2012 revealed a perfusion defect in the mid to distal anteroapical, apical, inferoapical walls consistent with distal LAD territory scar. Ejection fraction was 54% there was mild anteroapical hypokinesis. This was not significantly changed from his last nuclear study in 2010.  Additional problems include  a history of sleep apnea and uses CPAP therapy 100% of the time. uses [employer] for his DME company.  He admits to 100% compliance.  He denies any awareness of breakthrough snoring.  He denies any hypersomnolence.  His sleep is restorative.  He denies chest pain, PND orthopnea.  He denies presyncope.  He had been taking low-dose aspirin 2 tablets daily.  I recommended reduction to 81 mg.  He is unaware of any bleeding.  He does admit to low back discomfort.  He is been found on CT imaging to have bulging disks.  He has seen Dr. [name] for neurosurgical evaluation has undergone epidural injections.  When I saw him in February 2018 his atenolol dose had been reduced by his primary physician from 100 mg down to 50 mg.  He was on atenolol 50 mg and lisinopril 20 mg as well as furosemide 40 mg for hypertension.  He denied any significant edema.  He  was tachycardic and I titrated atenolol up to 75 mg.  He continued to be on simvastatin for hyperlipidemia.  He has obstructive sleep apnea and admitted to 100% compliance with CPAP therapy.  Prescription was written for new supplies with his DME company [facility] [employer]  [facility].   When I saw him in September 2018 he was without chest pain shortness of breath or palpitations.  A nuclear study on August 20, 2016 showed an EF of 36% with moderate scar apically without associated ischemia.  There was a suggestion of possible mild transient ischemic dilation.  He continued to be asymptomatic.  He underwent low back surgery with Dr. [name].  He was placed on oxycodone for pain discomfort following surgery.  He has not liked the mental aspects of this drug.  He tells me he did on one day take his wife's gabapentin and noticed improvement in his peripheral neuropathy and leg discomfort and was questioning about possibly using this instead.  He admits to significant lower extremity edema bilaterally.  He denies recurrent chest pain PND orthopnea.  He continues to use CPAP with 100% compliance.  When I saw him in March 2019 he had 3+ tense lower extremity edema and his blood pressure was mildly increased.  He was taking atenolol 75 mg daily, furosemide 40 mg in the morning and 20 mg in the late afternoon in addition to lisinopril 30 mg daily.  I discontinued furosemide and changed him to torsemide 40 mg twice a day for several days but then dose reduction to 40 mg in the morning and 20 mg in the late afternoon but he could increase this back to 40 twice daily with recurrent edema.  He has only noticed minimal benefit with the leg swelling.  Typically in the morning he hardly has any edema but as the day progresses his edema significantly increases.  He denies any chest pain.  He continues to use CPAP with 100% compliance.    At F/U in May 2019 he continued to have at least 2+ tense edema as the day progressed.  I recommended metolazone 2.5 mg 30 minutes prior to his morning dose of torsemide for 2 days and then recommended changing this to every other day and then every third day depending upon his leg edema.  This did improve his edema but apparently his blood pressure decreased  and he stopped taking this after only several doses.  He had lost 11 pounds since that evaluation.    When I saw him in June 2019 he again experienced significant leg swelling.  I recommended support stockings with at least 20-30 mm pressure support.  I suggested a slight rechallenge of metolazone and recommended 2.5 mg once a week 30 minutes before the morning torsemide dose and at that time he was to decrease lisinopril to 20 mg prior to reinitiating weekly metolazone.  Renal function was stable with a creatinine of 1.01.    When seen in August 2019 he had noticed some improvement with metolazone.  At time I increased this to 2 days/week and again recommended compression stockings.  Over the past several months, he has felt well.  He states his blood pressure at home typically is run 120/70.  His leg swelling has improved.  He now sees Dr. Claris Gower for his primary care.    I last saw him in November 2019.  On that day he did not take his torsemide nor his metolazone prior to his office visit and he had  leg swelling   He did see Nehemiah Massed in September 2019.  He will be undergoing laboratory with Dr. Arelia Sneddon.   He admits to 100% CPAP use.  He denies any chest pain PND orthopnea or arrhythmias.   Since I last saw him, he  denies any chest pain or shortness of breath.  He has been taking metolazone 2.5 mg on Tuesday and Friday and takes torsemide 20 mg 2 times a day.  He has been using CPAP with 100% compliance but recently his machine completely malfunctioned and stopped working.  As result he has not been able to use CPAP.  He has not been sleeping well since and is in need for new machine.  He presents for evaluation.   The patient does not have symptoms concerning for COVID-19 infection (fever, chills, cough, or new shortness of breath).    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   Coronary artery disease    Glaucoma    Heart disease    History of kidney stones    Hyperlipidemia    Hypertension    Liver disease    pt denies   Lung disease    pt denies   Myocardial infarction ([place]) 17 yrs ago   Neuropathy    feet   Sleep apnea with use of continuous positive airway pressure (CPAP)    uses CPAP   Past Surgical History:  Procedure Laterality Date   APPENDECTOMY  2014   COLON RESECTION N/A 08/27/2012   Procedure: Diagnostic laparoscopy; Ileocecectomy;  Surgeon: Madilyn Hook, DO;  Location: [facility];  Service: General;  Laterality: N/A;   COLONOSCOPY WITH PROPOFOL N/A 04/09/2015   Procedure: COLONOSCOPY WITH PROPOFOL;  Surgeon: Garlan Fair, MD;  Location: [facility];  Service: Endoscopy;  Laterality: N/A;   CORONARY ARTERY BYPASS GRAFT  17 yrs ago   x 5   CYSTOSCOPY/URETEROSCOPY/HOLMIUM LASER/STENT PLACEMENT Right 02/25/2018   Procedure: RIGHT URETEROSCOPY/HOLMIUM LASER/ STONE REMOVAL  STENT PLACEMENT;  Surgeon: Ardis Hughs, MD;  Location: [facility];  Service: Urology;  Laterality: Right;   LUMBAR LAMINECTOMY/DECOMPRESSION MICRODISCECTOMY N/A 10/19/2015   Procedure: Lumbar three to four LAMINECTOMY/FORAMINOTOMY;  Surgeon: Erline Levine, MD;  Location: [facility];  Service: Neurosurgery;  Laterality: N/A;   LUNG SURGERY  yrs ago   right and left lungs - "had air pockets"     Current Meds  Medication Sig   aspirin 325 MG tablet Take 325 mg by mouth daily.   atenolol (TENORMIN) 50 MG tablet TAKE 1 AND 1/2 TABLETS BY  MOUTH DAILY   BEE POLLEN PO Take 3 capsules by mouth daily.    gabapentin (NEURONTIN) 300 MG capsule Take 1 capsule (300 mg total) by mouth 2 (two) times daily.   Glucosamine-Chondroitin (OSTEO BI-FLEX REGULAR STRENGTH PO) Take 1 tablet by mouth 2 (two) times daily.   lisinopril (PRINIVIL,ZESTRIL) 20 MG tablet Take 1 tablet (20 mg total) by mouth daily.   metolazone (ZAROXOLYN) 2.5 MG tablet Take as directed  (20-30 mins prior to morning dose of torsemide) (Patient taking differently: Take 2.5 mg by mouth 2 (two) times a week. On Monday and Friday)   phenazopyridine (PYRIDIUM) 200 MG tablet Take 1 tablet (200 mg total) by mouth 3 (three) times daily as needed for pain.   potassium chloride SA (K-DUR,KLOR-CON) 20 MEQ tablet Take 1 tablet by  mouth when you take a metolazone (Patient taking differently: Take 20 mEq by mouth 2 (two) times a week. )   pyridOXINE (VITAMIN B-6) 100 MG tablet Take 100 mg by mouth daily.   rosuvastatin (CRESTOR) 20 MG tablet Take 0.5 tablets (10 mg total) by mouth daily. (Patient taking differently: Take 10 mg by mouth at bedtime. )   torsemide (DEMADEX) 20 MG tablet Take 2 tablets (40 mg total) by mouth 2 (two) times daily. And as directed (Patient taking differently: Take 40 mg by mouth 2 (two) times daily. )   traMADol (ULTRAM) 50 MG tablet Take 1-2 tablets (50-100 mg total) by mouth every 6 (six) hours as needed for moderate pain.   vitamin B-12 (CYANOCOBALAMIN) 1000 MCG tablet Take 1,000 mcg by mouth daily.     Allergies:   Other   Social History   Tobacco Use   Smoking status: Former Smoker    Types: Cigarettes    Last attempt to quit: 08/25/1988    Years since quitting: 29.8   Smokeless tobacco: Never Used  Substance Use Topics   Alcohol use: No    Alcohol/week: 0.0 standard drinks   Drug use: No     Family Hx: The patient's family history includes Heart attack in his brother; Hypertension in his father and sister.  ROS:   Please see the history of present illness.    He denies any fevers chills night sweats History of cataract/glaucoma surgery No cough wheezing shortness of breath No chest pain PND orthopnea Positive for edema in the leg swelling Positive for OSA, CPAP machine is no longer working and he is in need for new CPAP unit. Positive for intermittent low back pain History of herniated disks He walks with a cane No bleeding All  other systems reviewed and are negative.   Prior CV studies:   The following studies were reviewed today:  ------------------------------------------------------------------- Study Conclusions  - Left ventricle: The cavity size was mildly dilated. There was   mild concentric hypertrophy. Systolic function was mildly   reduced. The estimated ejection fraction was in the range of 45%   to 50%. Diffuse hypokinesis. Doppler parameters are consistent   with abnormal left ventricular relaxation (grade 1 diastolic   dysfunction). Doppler parameters are consistent with   indeterminate ventricular filling pressure. - Aortic valve: Transvalvular velocity was within the normal range.   There was no stenosis. There was mild regurgitation. - Mitral valve: Transvalvular velocity was within the normal range.   There was no evidence for stenosis. There was trivial   regurgitation. - Left atrium: The atrium was mildly to moderately dilated. - Right ventricle: The cavity size was normal. Wall thickness was   normal. Systolic function was normal. - Tricuspid valve: There was moderate regurgitation. - Pulmonary arteries: Systolic pressure was within the normal   range. PA peak pressure: 35 mm Hg (S).   Labs/Other Tests and Data Reviewed:    EKG:  An ECG dated 12/01/2018 was personally reviewed today and demonstrated:  Probable low atrial rhythm with inverted P waves in lead III and aVF.  Right bundle branch block with repolarization.  Recent Labs: 08/17/2017: ALT 39 02/22/2018: BUN 12; Creatinine, Ser 0.91; Hemoglobin 12.5; Platelets 157; Potassium 4.4; Sodium 138   Recent Lipid Panel Lab Results  Component Value Date/Time   CHOL 144 10/28/2016 11:55 AM   TRIG 87 10/28/2016 11:55 AM   HDL 64 10/28/2016 11:55 AM   CHOLHDL 2.3 10/28/2016 11:55 AM   CHOLHDL 2.1  04/17/2016 11:12 AM   LDLCALC 63 10/28/2016 11:55 AM    Wt Readings from Last 3 Encounters:  06/16/18 280 lb (127 kg)  02/25/18 276  lb (125.2 kg)  02/22/18 276 lb (125.2 kg)     Objective:    Vital Signs:  BP 120/72    Pulse 62    Ht 6\' 1"  (1.854 m)    Wt 280 lb (127 kg)    BMI 36.94 kg/m    Since this was a phone telemedicine visit I could not visually see the patient However, he denies any significant change in his physical appearance Breathing is normal and not labored I did not hear any audible wheezing There was no chest discomfort to palpation Palpation of his pulse revealed this to be regular He denied any leg swelling today He had normal cognition mood and affect  ASSESSMENT & PLAN:    1. CAD, status post CABG revascularization: He suffered an anterior wall MI treated with PTCA of a totally occluded LAD but due to severe left main stenosis underwent CABG revascularization surgery with a LIMA to LAD, vein to diagonal, vein to third marginal vessel.  He has been documented to have significant salvage of myocardium from his acute PTCA.  He is not having any recurrent anginal symptoms.  His last nuclear studies have shown scar in the distal LAD territory concordant with his prior myocardial infarction. 2. Ischemic cardiomyopathy.  Most recent echo Doppler study in October 2018 showed EF 45 to 50% with diffuse hypokinesis with grade 1 diastolic dysfunction, mild AR, mild to moderate dilatation of his left atrium with moderate TR and mild pulmonary hypertension. 3. Essential hypertension: Blood pressure today is stable on current therapy consisting of atenolol 75 mg, lisinopril 20 mg, torsemide 20 mg twice a day metolazone 2.5 mg 2 times per week 4. Lower extremity edema: Resolved on current therapy.  He was in need for new prescription for metolazone this was sent to his pharmacy. 5. Obstructive sleep apnea: The patient has a long history of sleep apnea with excellent CPAP compliance over many years.  His machine was old and since I last saw him has completely malfunctioned and is no longer operable.  I will refer him  to get a new ResMed AirSense 10 Auto CPAP machine. 6. Hyperlipidemia with target less than 70: He continues to be on rosuvastatin 20 mg.  He has had laboratory by Dr. Claris Gower.  We will try to obtain these results.  COVID-19 Education: The signs and symptoms of COVID-19 were discussed with the patient and how to seek care for testing (follow up with PCP or arrange E-visit).  The importance of social distancing was discussed today.  Time:   Today, I have spent 25 minutes with the patient with telehealth technology discussing the above problems.     Medication Adjustments/Labs and Tests Ordered: Current medicines are reviewed at length with the patient today.  Concerns regarding medicines are outlined above.   Tests Ordered: No orders of the defined types were placed in this encounter.   Medication Changes: No orders of the defined types were placed in this encounter.   Disposition:  Follow up will arrange for follow-up within 3 months after he obtains his new CPAP machine  Signed, Shelva Majestic, MD  06/16/2018 3:35 PM    [place]

## 2018-06-21 ENCOUNTER — Other Ambulatory Visit: Payer: Self-pay | Admitting: Cardiovascular Disease

## 2018-08-26 ENCOUNTER — Other Ambulatory Visit: Payer: Self-pay | Admitting: Cardiovascular Disease

## 2018-09-17 ENCOUNTER — Other Ambulatory Visit: Payer: Self-pay

## 2018-09-17 MED ORDER — METOLAZONE 2.5 MG PO TABS: ORAL_TABLET | ORAL | 8 refills | Status: DC

## 2018-09-29 ENCOUNTER — Other Ambulatory Visit: Payer: Self-pay | Admitting: Cardiovascular Disease

## 2018-12-21 ENCOUNTER — Encounter: Payer: Self-pay | Admitting: Cardiovascular Disease

## 2018-12-21 ENCOUNTER — Ambulatory Visit: Payer: Medicare Other | Admitting: Cardiovascular Disease

## 2018-12-21 ENCOUNTER — Other Ambulatory Visit: Payer: Self-pay

## 2018-12-21 VITALS — BP 144/74 | HR 62 | Temp 97.2°F | Ht 73.0 in | Wt 279.0 lb

## 2018-12-21 DIAGNOSIS — Z951 Presence of aortocoronary bypass graft: Secondary | ICD-10-CM | POA: Diagnosis not present

## 2018-12-21 DIAGNOSIS — I10 Essential (primary) hypertension: Secondary | ICD-10-CM

## 2018-12-21 DIAGNOSIS — G4733 Obstructive sleep apnea (adult) (pediatric): Secondary | ICD-10-CM

## 2018-12-21 DIAGNOSIS — I2510 Atherosclerotic heart disease of native coronary artery without angina pectoris: Secondary | ICD-10-CM | POA: Diagnosis not present

## 2018-12-21 DIAGNOSIS — R600 Localized edema: Secondary | ICD-10-CM

## 2018-12-21 DIAGNOSIS — E668 Other obesity: Secondary | ICD-10-CM

## 2018-12-21 DIAGNOSIS — Z9989 Dependence on other enabling machines and devices: Secondary | ICD-10-CM

## 2018-12-21 DIAGNOSIS — E669 Obesity, unspecified: Secondary | ICD-10-CM

## 2018-12-21 DIAGNOSIS — I2583 Coronary atherosclerosis due to lipid rich plaque: Secondary | ICD-10-CM

## 2018-12-21 DIAGNOSIS — E785 Hyperlipidemia, unspecified: Secondary | ICD-10-CM | POA: Diagnosis not present

## 2018-12-21 MED ORDER — METOLAZONE 2.5 MG PO TABS: ORAL_TABLET | ORAL | 8 refills | Status: DC

## 2018-12-21 MED ORDER — ASPIRIN EC 81 MG PO TBEC: 81.0000 mg | DELAYED_RELEASE_TABLET | Freq: Every day | ORAL | 3 refills | Status: DC

## 2018-12-21 MED ORDER — TORSEMIDE 20 MG PO TABS: 40.0000 mg | ORAL_TABLET | Freq: Every day | ORAL | 3 refills | Status: DC

## 2018-12-21 NOTE — Progress Notes (Signed)
Patient ID: Joseph Bryan, male   DOB: 05-13-1940, 78 y.o.   MRN: [id number]     Primary M.D.: Dr. Claris Gower  HPI: Joseph Bryan is a 78 y.o. male who presents to the office today for a 6 month followup cardiology evaluation.  Joseph Bryan has CAD and suffered an anterior wall myocardial infarction in November 1998. He underwent primary PTCA of the totally occluded proximal LAD done by me. Due to the concomitant high-grade distal left main stenosis with ostial encroachment to a circumflex vessel following stabilization from his initial intervention and significant myocardial salvage he underwent elective CABG surgery (LIMA to LAD, vein to diagonal, vein to third marginal vessel). He did develop postoperative DVT for which he did require Coumadin therapy. His last catheterization in 2010 showed patent grafts. He has a history of hyperlipidemia also peripheral neuropathy. Two years ago he was admitted to [facility] long hospital with abdominal pain and I saw him for preoperative preoperative evaluation.  A preoperative 2-D echo Doppler study showed normal systolic function with mild/moderate tricuspid regurgitation. Mild pulmonary hypertension with a PA pressure 33 mm. He was found to have colonic ischemia with presumed appendix inflammation.  From a cardiac standpoint, he ultimately tolerated his abdominal surgery where he was found to have colonic ischemia and underwent laparoscopic ileo-colectomy by Dr. [name].   A nuclear perfusion which on 11/16/2012 revealed a perfusion defect in the mid to distal anteroapical, apical, inferoapical walls consistent with distal LAD territory scar. Ejection fraction was 54% there was mild anteroapical hypokinesis. This was not significantly changed from his last nuclear study in 2010.  Additional problems include  a history of sleep apnea and uses CPAP therapy 100% of the time. uses [employer] for his DME company.  He admits to 100% compliance.  He denies any  awareness of breakthrough snoring.  He denies any hypersomnolence.  His sleep is restorative.  He had been taking low-dose aspirin 2 tablets daily.  I recommended reduction to 81 mg.  He is unaware of any bleeding.  He does admit to low back discomfort.  He is been found on CT imaging to have bulging disks.  He has seen Dr. [name] for neurosurgical evaluation has undergone epidural injections.  When I saw him in February 2018 his atenolol dose had been reduced by his primary physician from 100 mg down to 50 mg.  He was on atenolol 50 mg and lisinopril 20 mg as well as furosemide 40 mg for hypertension.  He denies any significant edema.  He was tachycardic and I titrated atenolol up to 75 mg.  He continued to be on simvastatin for hyperlipidemia.  He has obstructive sleep apnea and admitted to 100% compliance with CPAP therapy.  Prescription was written for new supplies with his DME company of [employer].   When I saw him in September 2018 at which time he was without chest pain shortness of breath or palpitations.  A nuclear study on August 20, 2016 showed an EF of 36% with moderate scar apically without associated ischemia.  There was a suggestion of possible mild transient ischemic dilation.  He continued to be asymptomatic.  He underwent low back surgery with Dr. [name].  He was placed on oxycodone for pain discomfort following surgery.  He has not liked the mental aspects of this drug.  He tells me he did on one day take his wife's gabapentin and noticed improvement in his peripheral neuropathy and leg discomfort and was  questioning about possibly using this instead.  He admits to significant lower extremity edema bilaterally.  He denies recurrent chest pain PND orthopnea.  He continues to use CPAP with 100% compliance.  When I saw him in March 2019 he had 3+ tense lower extremity edema and his blood pressure was mildly increased.  He was taking atenolol 75 mg daily, furosemide 40 mg in the morning  and 20 mg in the late afternoon in addition to lisinopril 30 mg daily.  I discontinued furosemide and changed him to torsemide 40 mg twice a day for several days but then dose reduction to 40 mg in the morning and 20 mg in the late afternoon but he could increase this back to 40 twice daily with recurrent edema.  He has only noticed minimal benefit with the leg swelling.  Typically in the morning he hardly has any edema but as the day progresses his edema significantly increases.  He denies any chest pain.  He continues to use CPAP with 100% compliance.    At F/U in May 2019 he continued to have at least 2+ tense edema as the day progressed.  I recommended metolazone 2.5 mg 30 minutes prior to his morning dose of torsemide for 2 days and then recommended changing this to every other day and then every third day depending upon his leg edema.  This did improve his edema but apparently his blood pressure decreased and he stopped taking this after only several doses.  He had lost 11 pounds since that evaluation.    When I saw him in June 2019 he again experienced significant leg swelling.  I recommended support stockings with at least 20-30 mm pressure support.  I suggested a slight rechallenge of metolazone and recommended 2.5 mg once a week 30 minutes before the morning torsemide dose and at that time he was to decrease lisinopril to 20 mg prior to reinitiating weekly metolazone.  Renal function was stable with a creatinine of 1.01.    I saw him in August 2019 at which time he had noticed some improvement with metolazone.  At time I increased this to 2 days/week and again recommended compression stockings.  At follow-up in November 2019 he felt well.  He states his blood pressure at home typically is run 120/70.  His leg swelling has improved.  He now sees Dr. Claris Gower for his primary care.  He did not take his torsemide nor his metolazone today and he does have leg swelling today.  He did see Nehemiah Massed in September 2019.   He admits to 100% CPAP use.  He denies any chest pain PND orthopnea or arrhythmias.    He was last evaluated in a telemedicine visit on June 16, 2018.  Since his prior evaluation he denied chest pain or shortness of breath.  He was taking metolazone 2.5 mg on Tuesday and Friday and takes torsemide 20 mg 2 times a day.  He had been using CPAP with 100% compliance but  his machine completely malfunctioned and stopped working.  As result he has not been able to use CPAP.  He has not been sleeping well since and is in need for new machine.  His sleep study was in 2010.  Presently, he admits to nocturia 3-4 times per night.  He continues to experience significant lower extremity edema.  He presents for reevaluation.   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   Coronary artery disease    Glaucoma    Heart disease    History of kidney stones    Hyperlipidemia    Hypertension    Liver disease    pt denies   Lung disease    pt denies   Myocardial infarction ([place]) 17 yrs ago   Neuropathy    feet   Sleep apnea with use of continuous positive airway pressure (CPAP)    uses CPAP    Past Surgical History:  Procedure Laterality Date   APPENDECTOMY  2014   COLON RESECTION N/A 08/27/2012   Procedure: Diagnostic laparoscopy; Ileocecectomy;  Surgeon: Madilyn Hook, DO;  Location: [facility];  Service: General;  Laterality: N/A;   COLONOSCOPY WITH PROPOFOL N/A 04/09/2015   Procedure: COLONOSCOPY WITH PROPOFOL;  Surgeon: Garlan Fair, MD;  Location: [facility];  Service: Endoscopy;  Laterality: N/A;   CORONARY ARTERY BYPASS GRAFT  17 yrs ago   x 5   CYSTOSCOPY/URETEROSCOPY/HOLMIUM LASER/STENT PLACEMENT Right 02/25/2018   Procedure: RIGHT URETEROSCOPY/HOLMIUM LASER/ STONE REMOVAL  STENT PLACEMENT;  Surgeon: Ardis Hughs, MD;  Location: [facility];   Service: Urology;  Laterality: Right;   LUMBAR LAMINECTOMY/DECOMPRESSION MICRODISCECTOMY N/A 10/19/2015   Procedure: Lumbar three to four LAMINECTOMY/FORAMINOTOMY;  Surgeon: Erline Levine, MD;  Location: [facility];  Service: Neurosurgery;  Laterality: N/A;   LUNG SURGERY  yrs ago   right and left lungs - "had air pockets"    Allergies  Allergen Reactions   Other Rash and Other (See Comments)    CHG soap and wipes    Current Outpatient Medications  Medication Sig Dispense Refill   atenolol (TENORMIN) 50 MG tablet TAKE 1 AND 1/2 TABLETS BY  MOUTH DAILY 135 tablet 2   BEE POLLEN PO Take 3 capsules by mouth daily.      gabapentin (NEURONTIN) 300 MG capsule TAKE 1 CAPSULE BY MOUTH TWO TIMES DAILY 180 capsule 3   Glucosamine-Chondroitin (OSTEO BI-FLEX REGULAR STRENGTH PO) Take 1 tablet by mouth 2 (two) times daily.     metolazone (ZAROXOLYN) 2.5 MG tablet TAKE EVERY OTHER DAY (20-30 MINUTES PRIOR TOMORNING DOSE OF TORSEMIDE) 30 tablet 8   pyridOXINE (VITAMIN B-6) 100 MG tablet Take 100 mg by mouth daily.     rosuvastatin (CRESTOR) 20 MG tablet TAKE 1 TABLET BY MOUTH DAILY 90 tablet 1   traMADol (ULTRAM) 50 MG tablet Take 1-2 tablets (50-100 mg total) by mouth every 6 (six) hours as needed for moderate pain. 15 tablet 0   vitamin B-12 (CYANOCOBALAMIN) 1000 MCG tablet Take 1,000 mcg by mouth daily.     aspirin EC 81 MG tablet Take 1 tablet (81 mg total) by mouth daily. 90 tablet 3   potassium chloride SA (KLOR-CON) 20 MEQ tablet TAKE 1 TABLET BY MOUTH DAILY DAILY WHEN YOU TAKE A METOLAZONE 90 tablet 0   torsemide (DEMADEX) 20 MG tablet Take 2 tablets (40 mg total) by mouth daily. And as directed 90 tablet 3   No current facility-administered medications for this visit.    Social History   Socioeconomic History   Marital status: Married    Spouse name: Not on file   Number of children: Not on file   Years of education: Not on file   Highest education level: Not on file   Occupational History   Not on file  Tobacco Use   Smoking status: Former Smoker  Types: Cigarettes    Quit date: 08/25/1988    Years since quitting: 30.3   Smokeless tobacco: Never Used  Substance and Sexual Activity   Alcohol use: No    Alcohol/week: 0.0 standard drinks   Drug use: No   Sexual activity: Not on file  Other Topics Concern   Not on file  Social History Narrative   Not on file   Social Determinants of Health   Financial Resource Strain:    Difficulty of Paying Living Expenses: Not on file  Food Insecurity:    Worried About [place] in the Last Year: Not on file   Ran Out of Food in the Last Year: Not on file  Transportation Needs:    Lack of Transportation (Medical): Not on file   Lack of Transportation (Non-Medical): Not on file  Physical Activity:    Days of Exercise per Week: Not on file   Minutes of Exercise per Session: Not on file  Stress:    Feeling of Stress : Not on file  Social Connections:    Frequency of Communication with Friends and Family: Not on file   Frequency of Social Gatherings with Friends and Family: Not on file   Attends Religious Services: Not on file   Active Member of Clubs or Organizations: Not on file   Attends Archivist Meetings: Not on file   Marital Status: Not on file  Intimate Partner Violence:    Fear of Current or Ex-Partner: Not on file   Emotionally Abused: Not on file   Physically Abused: Not on file   Sexually Abused: Not on file    Family History  Problem Relation Age of Onset   Hypertension Father    Hypertension Sister    Heart attack Brother    He is married has 3 children 4 grandchildren 2 great-grandchildren. There is remote tobacco history.  ROS General: Negative; No fevers, chills, or night sweats;  HEENT:  positive for cataract/glaucoma surgery, no sinus congestion, difficulty swallowing Pulmonary: Negative; No cough, wheezing, shortness of  breath, hemoptysis Cardiovascular: Negative; No chest pain, presyncope, syncope, palpitations Positive for leg swelling GI: Negative; No nausea, vomiting, diarrhea, or abdominal pain GU: Negative; No dysuria, hematuria, or difficulty voiding Musculoskeletal: Positive for low back pain and herniated disks; he walks with a cane status post recent surgery on L3-4 and L4-5;  Hematologic/Oncology: Negative; no easy bruising, bleeding Endocrine: Negative; no heat/cold intolerance; no diabetes Neuro: Negative; no changes in balance, headaches Skin: Negative; No rashes or skin lesions Psychiatric: Negative; No behavioral problems, depression Sleep: Positive for obstructive sleep apnea on CPAP therapy since 2010.  Machine malfunction, now with breakthrough snoring, nonrestorative sleep, daytime somnolence.  No bruxism, restless legs, hypnogognic hallucinations, no cataplexy Other comprehensive 14 point system review is negative.   PE BP (!) 144/74    Pulse 62    Temp (!) 97.2 F (36.2 C)    Ht '6\' 1"'  (1.854 m)    Wt 279 lb (126.6 kg)    SpO2 96%    BMI 36.81 kg/m    Repeat blood pressure by me 140/76  Wt Readings from Last 3 Encounters:  12/21/18 279 lb (126.6 kg)  06/16/18 280 lb (127 kg)  02/25/18 276 lb (125.2 kg)   General: Alert, oriented, no distress.  Skin: normal turgor, no rashes, warm and dry HEENT: Normocephalic, atraumatic. Pupils equal round and reactive to light; sclera anicteric; extraocular muscles intact; Nose without nasal septal hypertrophy Mouth/Parynx benign;  Mallinpatti scale 3 Neck: No JVD, no carotid bruits; normal carotid upstroke Lungs: clear to ausculatation and percussion; no wheezing or rales Chest wall: without tenderness to palpitation Heart: PMI not displaced, RRR, s1 s2 normal, 1/6 systolic murmur, no diastolic murmur, no rubs, gallops, thrills, or heaves Abdomen: soft, nontender; no hepatosplenomehaly, BS+; abdominal aorta nontender and not dilated by  palpation. 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  Probable LVH.  QTc interval 462 ms  November 2019 ECG (independently read by me): Probable low atrial rhythm with inverted P waves in lead III and aVF.  Right bundle branch block with repolarization.  August 2019 ECG (independently read by me):  Sinus bradycardia at 55; RBBB, LAHB, normal intervals  June 2019 ECG (independently read by me): Normal sinus rhythm at 60 bpm.  Right bundle branch block with repolarization changes.  March 2019 ECG (independently read by me): sinus rhythm at 86 bpm with occasional PACs. 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  QTc interval 473 ms  September 2018 ECG (independently read by me): Normal sinus rhythm at 65 bpm.  Right bundle-branch block with repolarization changes.  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  LVH by voltage.  Normal intervals.  No ectopy.  February 2018 ECG (independently read by me): Sinus tachycardia at 102 bpm, right bundle branch block, left anterior hemiblock.  LVH.  February 2017 ECG (independently read by me): Normal sinus rhythm at 63 bpm.  Right bundle-branch block with repolarization changes.  Left axis deviation.  Normal intervals.  December 2015 ECG (independently read by me): Sinus bradycardia 56 bpm.  Right bundle branch block with repolarization changes.  Normal intervals.  December 2014 ECG: Sinus rhythm at 67 beats per minute with right bundle branch block and nonspecific ST-T changes. Qtc 464 msec  LABS: BMP Latest Ref  Rng & Units 02/22/2018 01/03/2018 08/17/2017  Glucose 70 - 99 mg/dL 94 98 84  BUN 8 - 23 mg/dL '12 16 16  ' Creatinine 0.61 - 1.24 mg/dL 0.91 0.98 1.05  BUN/Creat Ratio 10 - 24 - - 15  Sodium 135 - 145 mmol/L 138 139 145(H)  Potassium 3.5 - 5.1 mmol/L 4.4 3.7 4.9  Chloride 98 - 111 mmol/L 103 104 102  CO2 22 - 32 mmol/L '28 26 27  ' Calcium 8.9 - 10.3 mg/dL 9.1 8.7(L) 9.2   Hepatic Function Latest Ref Rng & Units 08/17/2017 06/03/2017 01/12/2017  Total Protein 6.0 - 8.5 g/dL 7.3 7.6 7.0  Albumin 3.5 - 4.8 g/dL 3.7 3.8 3.6  AST 0 - 40 IU/L 52(H) 66(H) 68(H)  ALT 0 - 44 IU/L 39 51(H) 85(H)  Alk Phosphatase 39 - 117 IU/L 240(H) 288(H) 170(H)  Total Bilirubin 0.0 - 1.2 mg/dL 0.4 0.5 0.8  Bilirubin, Direct <=0.2 mg/dL - - -   CBC Latest Ref Rng & Units 02/22/2018 01/03/2018 08/17/2017  WBC 4.0 - 10.5 K/uL 6.5 6.6 6.6  Hemoglobin 13.0 - 17.0 g/dL 12.5(L) 12.0(L) 12.7(L)  Hematocrit 39.0 - 52.0 % 41.5 39.3 39.4  Platelets 150 - 400 K/uL 157 150 158   Lab Results  Component Value Date   TSH 5.430 (H) 10/28/2016  Lab Results  Component Value Date   MCV 97.6 02/22/2018   MCV 98.3 01/03/2018   MCV 93 08/17/2017   Lipid Panel     Component Value Date/Time   CHOL 144 10/28/2016 1155   TRIG 87 10/28/2016 1155   HDL 64 10/28/2016 1155   CHOLHDL 2.3 10/28/2016 1155   CHOLHDL 2.1 04/17/2016 1112   VLDL 18 04/17/2016 1112   LDLCALC 63 10/28/2016 1155     RADIOLOGY: No results found.  IMPRESSION:  1. OSA on CPAP   2. Coronary artery disease due to lipid rich plaque   3. Hx of CABG   4. Hyperlipidemia with target LDL less than 70   5. Bilateral leg edema   6. Essential hypertension   7. Moderate obesity      ASSESSMENT AND PLAN: Joseph Bryan is a 78 year old white male who is 22 years status post an anterior wall myocardial infarction treated with PTCA of a totally occluded LAD in 1998. Due to severe  left main stenosis he underwent CABG surgery with a LIMA to his LAD, vein to diagonal,  and vein to the third marginal vessel. He has been documented have significant salvage of myocardium from his acute PTCA. An echo Doppler study done preoperatively showed a normal  ejection fraction with mild to moderate TR.  A nuclear perfusion study in 2014 showed distal LAD territory scar concordant with his prior myocardial infarction.   His last nuclear study revealed an EF of 36% with scar and raised the possibility of mild transient ischemic dilation.  I personally reviewed the myocardial perfusion images.  An echo Doppler study October 17, 2016 however showed an EF of 45-50% and there was diffuse hypokinesis with grade 1 diastolic dysfunction.  There was mild aortic insufficiency.  His left atrium was mild to moderately dilated.  There was moderate tricuspid regurgitation.  Peak PA pressure was mildly elevated at 35 mm presently.  His blood pressure today was mildly increased and he now has 3+ bilateral lower extremity edema despite taking torsemide 40 mg every morning, metolazone 2.5 mg 2 times per week in addition to his atenolol 75 mg daily.  I have recommended slight additional titration of metolazone to every other day until there has been significant resolution of his edema and then he can transition to every third day.  He has still been taking aspirin 325 mg and I have suggested reducing this again down to 81 mg.  He has continued to be on rosuvastatin 20 mg for hyperlipidemia with target LDL less than 70.  He has a peripheral neuropathy on Neurontin.  He has obstructive sleep apnea originally diagnosed in July 2010 with an overall AHI at 15.1/h and during REM sleep 40.4/h.  Respiratory disturbance index was also significantly increased at 40.0/h.  He had significant oxygen desaturation to a nadir of 78% during REM sleep.  He has been on CPAP therapy and noted significant benefit.  Unfortunately his machine is no longer functioning.  Since 10 years have elapsed since his initial study with significant  weight changes I am recommending reevaluation with a split-night study.  He will undergo Covid testing prior to his sleep study and once the study is complete we will prescribe proper therapy with either CPAP or BiPAP as determined by his evaluation.  I will see him in 3 months for reevaluation or sooner problems arise.   His blood pressure today is stable on his current regimen now consisting of atenolol 85 mg daily, lisinopril  20 mg, metolazone 2.5 mg 2 times per week in addition to torsemide 40 mg twice a day.  His peripheral edema has improved with the addition of metolazone biweekly.  He does have edema today but he states he did not take his medications due to the need to urinate since he was going to be away from home.  He states typically his edema is almost resolved in the morning.  He is tolerating rosuvastatin for hyperlipidemia.  Most recent LDL cholesterol was 63.  The past he had mild LFT elevation.  Liver studies were rechecked in August 2019 and ALT was 39.  He tells me Dr. Claris Gower will be rechecking blood work next week for one-year follow-up evaluation.  He is moderately obese with a BMI of 35.36.  Weight loss and increased exercise was recommended.  He continues to use CPAP with 100% compliance.  He is sleeping well.  He denies breakthrough snoring and his sleep is restorative.  He denies residual daytime sleepiness.  I will see him in 6 months for reevaluation  Time spent: 25 minutes Troy Sine, MD, [facility]  12/27/2018 5:22 PM

## 2018-12-21 NOTE — Patient Instructions (Addendum)
Medication Instructions:  INCREASE TORSEMIDE 40MG  DAILY  INCREASE METOLAZONE TO EVERY OTHER DAY, WHEN SWELLING GOES DOWN MAY DECREASE TO EVERY 3rd DAY  DECREASE ASPIRIN 81MG  DAILY EXTENDED RELEASE If you need a refill on your cardiac medications before your next appointment, please call your pharmacy.  Testing/Procedures: Your physician has recommended that you have a sleep study. This test records several body functions during sleep, including: brain activity, eye movement, oxygen and carbon dioxide blood levels, heart rate and rhythm, breathing rate and rhythm, the flow of air through your mouth and nose, snoring, body muscle movements, and chest and belly movement.  SOMEONE WILL BE CALLING YOU TO SCHEDULE THIS APPOINTMENT.  Follow-Up: IN 3 months In Person Shelva Majestic, MD.  ON MARCH 18TH  At [facility], you and your health needs are our priority.  As part of our continuing mission to provide you with exceptional heart care, we have created designated Provider Care Teams.  These Care Teams include your primary Cardiologist (physician) and Advanced Practice Providers (APPs -  Physician Assistants and Nurse Practitioners) who all work together to provide you with the care you need, when you need it.  Thank you for choosing [facility] at [facility]!!             Happy Holidays!!

## 2018-12-23 ENCOUNTER — Telehealth: Payer: Self-pay | Admitting: *Deleted

## 2018-12-23 ENCOUNTER — Other Ambulatory Visit: Payer: Self-pay | Admitting: Cardiovascular Disease

## 2018-12-23 NOTE — Telephone Encounter (Signed)
agree

## 2018-12-23 NOTE — Telephone Encounter (Signed)
Called patient to inform him of CPAP and COVID appointment details. Patient informed me that on 12/21/18 his granddaughter was at his home and later tested POSITIVE for COVID. Even though he was not home at the time, his wife was there. They have been ordered to quarantine. He states that neither one of them ae having symptoms. I told him that his sleep study is not for another 2 weeks and his COVID test is 10 days out. However, if he starts to have symptoms before his scheduled COVID test, he should not wait. He should immediately have one done. If he continues to be symptom free continue to quarantine and keep the scheduled COVID test as planned. Patient voiced understanding. Dr Claiborne Billings will be notified of our conversation. If he has any additional recommendations I will call him back.

## 2018-12-27 ENCOUNTER — Encounter: Payer: Self-pay | Admitting: Cardiovascular Disease

## 2019-01-01 ENCOUNTER — Other Ambulatory Visit (HOSPITAL_COMMUNITY): Payer: Medicare Other | Attending: Cardiovascular Disease

## 2019-01-04 ENCOUNTER — Encounter (HOSPITAL_BASED_OUTPATIENT_CLINIC_OR_DEPARTMENT_OTHER): Payer: Medicare Other | Admitting: Cardiovascular Disease

## 2019-03-31 ENCOUNTER — Ambulatory Visit: Payer: Medicare Other | Admitting: Cardiovascular Disease

## 2019-04-14 ENCOUNTER — Other Ambulatory Visit: Payer: Self-pay | Admitting: Cardiovascular Disease

## 2019-04-19 ENCOUNTER — Telehealth: Payer: Medicare Other | Admitting: Cardiovascular Disease

## 2019-04-19 ENCOUNTER — Telehealth: Payer: Self-pay

## 2019-04-19 NOTE — Progress Notes (Unsigned)
{Choose 1 Note Type (Video or Telephone):[phone]}   The patient was identified using 2 identifiers.  Date:  04/19/2019   ID:  Joseph Bryan, DOB 04/02/1940, MRN [id number]  {Patient Location:[id number]::"Home"} {Provider Location:[phone]::"Home"}  PCP:  Leonard Downing, MD  Cardiologist:  Shelva Majestic, MD *** Electrophysiologist:  None   Evaluation Performed:  {Choose Visit Type:[phone]::"Follow-Up Visit"}  Chief Complaint:  5 month F/U  History of Present Illness:    Joseph Bryan is a 79 y.o. male with ***   [name] has CAD and suffered an anterior wall myocardial infarction in November 1998. He underwent primary PTCA of the totally occluded proximal LAD done by me. Due to the concomitant high-grade distal left main stenosis with ostial encroachment to a circumflex vessel following stabilization from his initial intervention and significant myocardial salvage he underwent elective CABG surgery (LIMA to LAD, vein to diagonal, vein to third marginal vessel). He did develop postoperative DVT for which he did require Coumadin therapy. His last catheterization in 2010 showed patent grafts. He has a history of hyperlipidemia also peripheral neuropathy. Two years ago he was admitted to [facility] long hospital with abdominal pain and I saw him for preoperative preoperative evaluation.  A preoperative 2-D echo Doppler study showed normal systolic function with mild/moderate tricuspid regurgitation. Mild pulmonary hypertension with a PA pressure 33 mm. He was found to have colonic ischemia with presumed appendix inflammation.  From a cardiac standpoint, he ultimately tolerated his abdominal surgery where he was found to have colonic ischemia and underwent laparoscopic ileo-colectomy by Dr. [name].   A nuclear perfusion which on 11/16/2012 revealed a perfusion defect in the mid to distal anteroapical, apical, inferoapical walls consistent with distal LAD territory scar. Ejection  fraction was 54% there was mild anteroapical hypokinesis. This was not significantly changed from his last nuclear study in 2010.  Additional problems include  a history of sleep apnea and uses CPAP therapy 100% of the time. uses [employer] for his DME company.  He admits to 100% compliance.  He denies any awareness of breakthrough snoring.  He denies any hypersomnolence.  His sleep is restorative.  He had been taking low-dose aspirin 2 tablets daily.  I recommended reduction to 81 mg.  He is unaware of any bleeding.  He does admit to low back discomfort.  He is been found on CT imaging to have bulging disks.  He has seen Dr. [name] for neurosurgical evaluation has undergone epidural injections.  When I saw him in February 2018 his atenolol dose had been reduced by his primary physician from 100 mg down to 50 mg.  He was on atenolol 50 mg and lisinopril 20 mg as well as furosemide 40 mg for hypertension.  He denies any significant edema.  He was tachycardic and I titrated atenolol up to 75 mg.  He continued to be on simvastatin for hyperlipidemia.  He has obstructive sleep apnea and admitted to 100% compliance with CPAP therapy.  Prescription was written for new supplies with his DME company [facility] [employer].   When I saw him in September 2018 at which time he was without chest pain shortness of breath or palpitations.  A nuclear study on August 20, 2016 showed an EF of 36% with moderate scar apically without associated ischemia.  There was a suggestion of possible mild transient ischemic dilation.  He continued to be asymptomatic.  He underwent low back surgery with Dr. [name].  He was placed on oxycodone for  pain discomfort following surgery.  He has not liked the mental aspects of this drug.  He tells me he did on one day take his wife's gabapentin and noticed improvement in his peripheral neuropathy and leg discomfort and was questioning about possibly using this instead.  He admits to  significant lower extremity edema bilaterally.  He denies recurrent chest pain PND orthopnea.  He continues to use CPAP with 100% compliance.  When I saw him in March 2019 he had 3+ tense lower extremity edema and his blood pressure was mildly increased.  He was taking atenolol 75 mg daily, furosemide 40 mg in the morning and 20 mg in the late afternoon in addition to lisinopril 30 mg daily.  I discontinued furosemide and changed him to torsemide 40 mg twice a day for several days but then dose reduction to 40 mg in the morning and 20 mg in the late afternoon but he could increase this back to 40 twice daily with recurrent edema.  He has only noticed minimal benefit with the leg swelling.  Typically in the morning he hardly has any edema but as the day progresses his edema significantly increases.  He denies any chest pain.  He continues to use CPAP with 100% compliance.    At F/U in May 2019 he continued to have at least 2+ tense edema as the day progressed.  I recommended metolazone 2.5 mg 30 minutes prior to his morning dose of torsemide for 2 days and then recommended changing this to every other day and then every third day depending upon his leg edema.  This did improve his edema but apparently his blood pressure decreased and he stopped taking this after only several doses.  He had lost 11 pounds since that evaluation.    When I saw him in June 2019 he again experienced significant leg swelling.  I recommended support stockings with at least 20-30 mm pressure support.  I suggested a slight rechallenge of metolazone and recommended 2.5 mg once a week 30 minutes before the morning torsemide dose and at that time he was to decrease lisinopril to 20 mg prior to reinitiating weekly metolazone.  Renal function was stable with a creatinine of 1.01.    I saw him in August 2019 at which time he had noticed some improvement with metolazone.  At time I increased this to 2 days/week and again recommended  compression stockings.  At follow-up in November 2019 he felt well.  He states his blood pressure at home typically is run 120/70.  His leg swelling has improved.  He now sees Dr. Claris Gower for his primary care.  He did not take his torsemide nor his metolazone today and he does have leg swelling today.  He did see Nehemiah Massed in September 2019.   He admits to 100% CPAP use.  He denies any chest pain PND orthopnea or arrhythmias.    He was last evaluated in a telemedicine visit on June 16, 2018.  Since his prior evaluation he denied chest pain or shortness of breath.  He was taking metolazone 2.5 mg on Tuesday and Friday and takes torsemide 20 mg 2 times a day. He had been using CPAP with 100% compliance but  his machine completely malfunctioned and stopped working. As result he has not been able to use CPAP. He has not been sleeping well since and is in need for new machine.  His sleep study was in 2010.  Presently, he admits to nocturia 3-4  times per night.  He continues to experience significant lower extremity edema.   The patient {does/does not:200015} have symptoms concerning for COVID-19 infection (fever, chills, cough, or new shortness of breath).    Past Medical History:  Diagnosis Date  . 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 Coronary artery disease   . Glaucoma   . Heart disease   . History of kidney stones   . Hyperlipidemia   . Hypertension   . Liver disease    pt denies  . Lung disease    pt denies  . Myocardial infarction ([place]) 17 yrs ago  . Neuropathy    feet  . Sleep apnea with use of continuous positive airway pressure (CPAP)    uses CPAP   Past Surgical History:  Procedure Laterality Date  . APPENDECTOMY  2014  . COLON RESECTION N/A 08/27/2012   Procedure: Diagnostic laparoscopy; Ileocecectomy;  Surgeon: Madilyn Hook, DO;  Location: [facility] ORS;  Service: General;  Laterality: N/A;  .  COLONOSCOPY WITH PROPOFOL N/A 04/09/2015   Procedure: COLONOSCOPY WITH PROPOFOL;  Surgeon: Garlan Fair, MD;  Location: [facility] ENDOSCOPY;  Service: Endoscopy;  Laterality: N/A;  . CORONARY ARTERY BYPASS GRAFT  17 yrs ago   x 5  . CYSTOSCOPY/URETEROSCOPY/HOLMIUM LASER/STENT PLACEMENT Right 02/25/2018   Procedure: RIGHT URETEROSCOPY/HOLMIUM LASER/ STONE REMOVAL  STENT PLACEMENT;  Surgeon: Ardis Hughs, MD;  Location: [facility] ORS;  Service: Urology;  Laterality: Right;  . LUMBAR LAMINECTOMY/DECOMPRESSION MICRODISCECTOMY N/A 10/19/2015   Procedure: Lumbar three to four LAMINECTOMY/FORAMINOTOMY;  Surgeon: Erline Levine, MD;  Location: [facility];  Service: Neurosurgery;  Laterality: N/A;  . LUNG SURGERY  yrs ago   right and left lungs - "had air pockets"     No outpatient medications have been marked as taking for the 04/19/19 encounter (Appointment) with Troy Sine, MD.     Allergies:   Other   Social History   Tobacco Use  . Smoking status: Former Smoker    Types: Cigarettes    Quit date: 08/25/1988    Years since quitting: 30.6  . Smokeless tobacco: Never Used  Substance Use Topics  . Alcohol use: No    Alcohol/week: 0.0 standard drinks  . Drug use: No     Family Hx: The patient's family history includes Heart attack in his brother; Hypertension in his father and sister.  ROS:   Please see the history of present illness.    *** All other systems reviewed and are negative.   Prior CV studies:   The following studies were reviewed today:  ***  Labs/Other Tests and Data Reviewed:    EKG:  {EKG/Telemetry Strips Reviewed:[phone]}  Recent Labs: No results found for requested labs within last 8760 hours.   Recent Lipid Panel Lab Results  Component Value Date/Time   CHOL 144 10/28/2016 11:55 AM   TRIG 87 10/28/2016 11:55 AM   HDL 64 10/28/2016 11:55 AM   CHOLHDL 2.3 10/28/2016 11:55 AM   CHOLHDL 2.1 04/17/2016 11:12 AM   LDLCALC 63 10/28/2016 11:55 AM    Wt Readings  from Last 3 Encounters:  12/21/18 279 lb (126.6 kg)  06/16/18 280 lb (127 kg)  02/25/18 276 lb (125.2 kg)     Objective:    Vital Signs:  There were no vitals taken for this visit.   {HeartCare Virtual Exam (Optional):[phone]::"VITAL SIGNS:  reviewed"}  ASSESSMENT &  PLAN:    1. ***  COVID-19 Education: The signs and symptoms of COVID-19 were discussed with the patient and how to seek care for testing (follow up with PCP or arrange E-visit).  ***The importance of social distancing was discussed today.  Time:   Today, I have spent *** minutes with the patient with telehealth technology discussing the above problems.     Medication Adjustments/Labs and Tests Ordered: Current medicines are reviewed at length with the patient today.  Concerns regarding medicines are outlined above.   Tests Ordered: No orders of the defined types were placed in this encounter.   Medication Changes: No orders of the defined types were placed in this encounter.   Follow Up:  {F/U Format:[phone]} {follow up:15908}  Signed, Shelva Majestic, MD  04/19/2019 1:07 PM    [facility]

## 2019-04-19 NOTE — Telephone Encounter (Signed)
Attempted to contact pt to prepare for his VV with dr.Kelly today 4/6. Multiple attempts made to contact pt by different staff members. Left voicemail on both listed numbers.

## 2019-05-10 ENCOUNTER — Other Ambulatory Visit: Payer: Self-pay | Admitting: Surgery

## 2019-05-10 DIAGNOSIS — R748 Abnormal levels of other serum enzymes: Secondary | ICD-10-CM

## 2019-05-13 ENCOUNTER — Ambulatory Visit
Admission: RE | Admit: 2019-05-13 | Discharge: 2019-05-13 | Disposition: A | Discharge status: Home / Self Care | Payer: Medicare Other | Source: Ambulatory Visit | Attending: Surgery | Admitting: Surgery

## 2019-05-13 DIAGNOSIS — R748 Abnormal levels of other serum enzymes: Secondary | ICD-10-CM

## 2019-06-01 ENCOUNTER — Other Ambulatory Visit: Payer: Self-pay | Admitting: Cardiovascular Disease

## 2019-06-02 ENCOUNTER — Ambulatory Visit: Payer: Self-pay | Admitting: Surgery

## 2019-06-11 ENCOUNTER — Encounter (HOSPITAL_COMMUNITY): Payer: Self-pay | Admitting: Surgery

## 2019-06-11 DIAGNOSIS — R748 Abnormal levels of other serum enzymes: Secondary | ICD-10-CM | POA: Diagnosis present

## 2019-06-11 DIAGNOSIS — K8010 Calculus of gallbladder with chronic cholecystitis without obstruction: Secondary | ICD-10-CM | POA: Diagnosis present

## 2019-06-11 NOTE — H&P (Signed)
General Surgery [facility]  Joseph Bryan DOB: Jul 20, 1940 Married / Language: English / Race: White Male   History of Present Illness   The patient is a 79 year old male who presents for evaluation of gall stones.  CHIEF COMPLAINT: symptomatic cholelithiasis, elevated liver enzymes  Patient returns for follow-up. He was seen for evaluation of right upper quadrant abdominal pain. Patient had sustained a fall and also had rib fractures on the right side. Repeat laboratory studies showed persistent elevation of alkaline phosphatase and one of his transaminases. His total bilirubin was normal. Patient underwent ultrasound examination on May 13, 2019. This showed multiple gallstones completely filling the gallbladder with the largest stone measuring 1.3 cm. There was no biliary dilatation. There were no focal lesions within the liver. There was mild hepatic steatosis. Patient returns today to review this reported detail. Patient denies any nausea or vomiting. He is still having intermittent right upper quadrant abdominal pain radiating to the back. The discomfort associated with his rib fractures has improved.   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 Coronary Artery Bypass Graft  Lung Surgery  Bilateral.  Diagnostic Studies History  Colonoscopy  1-5 years ago  Allergies No Known Drug Allergies  Medication History  Gabapentin (300MG  Capsule, Oral) Active. Rosuvastatin Calcium (20MG  Tablet, Oral) Active. Torsemide (20MG  Tablet, Oral) Active. Atenolol (50MG  Tablet, Oral) Active. B-12 w/Vitamins (Oral) Active. Vitamin B-6 (100MG  Tablet, Oral) Active. Vitamin C (1000MG  Tablet, Oral) Active. Bee Plus (Oral) Active. Ecotrin  (325MG  Tablet DR, Oral) Active. metOLazone (5MG  Tablet, Oral) Active. Potassium Chloride (20MEQ Tablet ER, Oral) Active. Medications Reconciled  Social History  Alcohol use  Occasional alcohol use. Caffeine use  Coffee. No drug use  Tobacco use  Former smoker.  Family History  Arthritis  Daughter, Mother, Sister, Son.  Other Problems  Back Pain  Cholelithiasis  Congestive Heart Failure  Gastric Ulcer  High blood pressure  Hypercholesterolemia  Kidney Stone  Myocardial infarction  Pulmonary Embolism / Blood Clot in Legs  Sleep Apnea   Vitals  Weight: 289.8 lb Height: 73in Body Surface Area: 2.52 m Body Mass Index: 38.23 kg/m  Temp.: 98.20F (Thermal Scan)  Pulse: 85 (Regular)  BP: 138/68(Sitting, Left Arm, Standard)  Physical Exam  GENERAL APPEARANCE Development: normal Nutritional status: normal Gross deformities: none  SKIN Rash, lesions, ulcers: none Induration, erythema: none Nodules: none palpable  EYES Conjunctiva and lids: normal Pupils: equal and reactive Iris: normal bilaterally  EARS, NOSE, MOUTH, THROAT External ears: no lesion or deformity External nose: no lesion or deformity Hearing: grossly normal Due to Covid-19 pandemic, patient is wearing a mask.  NECK Symmetric: yes Trachea: midline Thyroid: no palpable nodules in the thyroid bed  CHEST Respiratory effort: normal Retraction or accessory muscle use: no Breath sounds: normal bilaterally Rales, rhonchi, wheeze: none  CARDIOVASCULAR Auscultation: regular rhythm, normal rate Murmurs: none Pulses: radial pulse 2+ palpable Lower extremity edema: Marked edema bilateral lower extremities  ABDOMEN Distension: none Masses: none palpable Tenderness: none Hepatosplenomegaly: not present Hernia: not present Well-healed midline incision just above the level of the umbilicus.  MUSCULOSKELETAL Station and gait: Unsteady gait. He uses a cane. 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 Patient underwent ultrasound examination which shows gallstones essentially filling the entire gallbladder with the largest measuring 1.3 cm in size. There was no biliary dilatation. There were no focal abnormalities of the liver. Laboratory studies demonstrated mild elevation of alkaline phosphatase and one of the transaminases. Total bilirubin level was normal.  Patient denies again discuss cholecystectomy. We discussed the laparoscopic technique versus the possibility of open surgery. We discussed the hospital stay to be anticipated. We discussed his postoperative recovery. He understands and wishes to proceed with surgery in the near future.  The risks and benefits of the procedure have been discussed at length with the patient. The patient understands the proposed procedure, potential alternative treatments, and the course of recovery to be expected. All of the patient's questions have been answered at this time. The patient wishes to proceed with surgery.   Armandina Gemma, MD [facility] Office: [phone]

## 2019-06-16 NOTE — Patient Instructions (Addendum)
DUE TO COVID-19 ONLY ONE VISITOR IS ALLOWED TO COME WITH YOU AND STAY IN THE WAITING ROOM ONLY DURING PRE OP AND PROCEDURE DAY OF SURGERY. Two  VISITOR MAY VISIT WITH YOU AFTER SURGERY IN YOUR PRIVATE ROOM DURING VISITING HOURS ONLY!  10a-8p  YOU NEED TO HAVE A COVID 19 TEST ON__6-4-21_____ @_1 :55 ______, THIS TEST MUST BE DONE BEFORE SURGERY, COME  [address], [place] , [address].  ([place]) ONCE YOUR COVID TEST IS COMPLETED, PLEASE BEGIN THE QUARANTINE INSTRUCTIONS AS OUTLINED IN YOUR HANDOUT.                Joseph Bryan  06/16/2019   Your procedure is scheduled on: 06-21-19    Report to [facility] Main  Entrance   Report to short stay at      0530  AM                              Bring cpap mask and tubing only     Call this number if you have problems the morning of surgery [phone]    Remember: Do not eat food or drink liquids :After Midnight.   BRUSH YOUR TEETH MORNING OF SURGERY AND RINSE YOUR MOUTH OUT, NO CHEWING GUM CANDY OR MINTS.     Take these medicines the morning of surgery with A SIP OF WATER: none                                 You may not have any metal on your body including hair pins and              piercings  Do not wear jewelry, lotions, powders or perfumes, deodorant                        Men may shave face and neck.   Do not bring valuables to the hospital. Calcium.  Contacts, dentures or bridgework may not be worn into surgery.       Patients discharged the day of surgery will not be allowed to drive home. IF YOU ARE HAVING SURGERY AND GOING HOME THE SAME DAY, YOU MUST HAVE AN ADULT TO DRIVE YOU HOME AND BE WITH YOU FOR 24 HOURS. YOU MAY GO HOME BY TAXI OR UBER OR ORTHERWISE, BUT AN ADULT MUST ACCOMPANY YOU HOME AND STAY WITH YOU FOR 24 HOURS.  Name and phone number of your driver:  Special Instructions: N/A              Please read over the following fact  sheets you were given: _____________________________________________________________________             [facility] - Preparing for Surgery Before surgery, you can play an important role.  Because skin is not sterile, your skin needs to be as free of germs as possible.  You can reduce the number of germs on your skin by washing with CHG (chlorahexidine gluconate) soap before surgery.  CHG is an antiseptic cleaner which kills germs and bonds with the skin to continue killing germs even after washing. Please DO NOT use if you have an allergy to CHG or antibacterial soaps.  If your skin becomes reddened/irritated stop  using the CHG and inform your nurse when you arrive at Short Stay. Do not shave (including legs and underarms) for at least 48 hours prior to the first CHG shower.  You may shave your face/neck. Please follow these instructions carefully:  1.  Shower with CHG Soap the night before surgery and the  morning of Surgery.  2.  If you choose to wash your hair, wash your hair first as usual with your  normal  shampoo.  3.  After you shampoo, rinse your hair and body thoroughly to remove the  shampoo.                           4.  Use CHG as you would any other liquid soap.  You can apply chg directly  to the skin and wash                       Gently with a scrungie or clean washcloth.  5.  Apply the CHG Soap to your body ONLY FROM THE NECK DOWN.   Do not use on face/ open                           Wound or open sores. Avoid contact with eyes, ears mouth and genitals (private parts).                       Wash face,  Genitals (private parts) with your normal soap.             6.  Wash thoroughly, paying special attention to the area where your surgery  will be performed.  7.  Thoroughly rinse your body with warm water from the neck down.  8.  DO NOT shower/wash with your normal soap after using and rinsing off  the CHG Soap.                9.  Pat yourself dry with a clean towel.             10.  Wear clean pajamas.            11.  Place clean sheets on your bed the night of your first shower and do not  sleep with pets. Day of Surgery : Do not apply any lotions/deodorants the morning of surgery.  Please wear clean clothes to the hospital/surgery center.  FAILURE TO FOLLOW THESE INSTRUCTIONS MAY RESULT IN THE CANCELLATION OF YOUR SURGERY PATIENT SIGNATURE_________________________________  NURSE SIGNATURE__________________________________  ________________________________________________________________________

## 2019-06-16 NOTE — Progress Notes (Signed)
PCP - Leonard Downing Cardiologist -Shelva Majestic lov 12-21-18 epic  PPM/ICD -  Device Orders -  Rep Notified -   Chest x-ray -  EKG - 12-21-18 epic Stress Test -  ECHO - 2018 epic Cardiac Cath -   Sleep Study -  CPAP -   Fasting Blood Sugar -  Checks Blood Sugar _____ times a day  Blood Thinner Instructions: Aspirin Instructions:  ERAS Protcol - PRE-SURGERY Ensure or G2-   COVID TEST-    Anesthesia review: mi,cad,osa cpap, cabgx5 vessel 1998  See clearance note in epic  Patient denies shortness of breath, fever, cough and chest pain at PAT appointment   All instructions explained to the patient, with a verbal understanding of the material. Patient agrees to go over the instructions while at home for a better understanding. Patient also instructed to self quarantine after being tested for COVID-19. The opportunity to ask questions was provided.

## 2019-06-17 ENCOUNTER — Encounter (HOSPITAL_COMMUNITY)
Admission: RE | Admit: 2019-06-17 | Discharge: 2019-06-17 | Disposition: A | Discharge status: Home / Self Care | Payer: Medicare Other | Source: Ambulatory Visit | Attending: Surgery | Admitting: Surgery

## 2019-06-17 ENCOUNTER — Other Ambulatory Visit (HOSPITAL_COMMUNITY): Payer: Medicare Other

## 2019-06-17 ENCOUNTER — Encounter (HOSPITAL_COMMUNITY): Payer: Self-pay

## 2019-06-17 ENCOUNTER — Other Ambulatory Visit: Payer: Self-pay

## 2019-06-17 ENCOUNTER — Other Ambulatory Visit (HOSPITAL_COMMUNITY)
Admission: RE | Admit: 2019-06-17 | Discharge: 2019-06-17 | Disposition: A | Discharge status: Home / Self Care | Payer: Medicare Other | Source: Ambulatory Visit | Attending: Surgery | Admitting: Surgery

## 2019-06-17 ENCOUNTER — Telehealth: Payer: Self-pay

## 2019-06-17 DIAGNOSIS — Z20822 Contact with and (suspected) exposure to covid-19: Secondary | ICD-10-CM | POA: Insufficient documentation

## 2019-06-17 DIAGNOSIS — I129 Hypertensive chronic kidney disease with stage 1 through stage 4 chronic kidney disease, or unspecified chronic kidney disease: Secondary | ICD-10-CM | POA: Diagnosis not present

## 2019-06-17 DIAGNOSIS — E785 Hyperlipidemia, unspecified: Secondary | ICD-10-CM | POA: Insufficient documentation

## 2019-06-17 DIAGNOSIS — Z951 Presence of aortocoronary bypass graft: Secondary | ICD-10-CM | POA: Insufficient documentation

## 2019-06-17 DIAGNOSIS — Z7982 Long term (current) use of aspirin: Secondary | ICD-10-CM | POA: Diagnosis not present

## 2019-06-17 DIAGNOSIS — K8010 Calculus of gallbladder with chronic cholecystitis without obstruction: Secondary | ICD-10-CM | POA: Insufficient documentation

## 2019-06-17 DIAGNOSIS — Z9861 Coronary angioplasty status: Secondary | ICD-10-CM | POA: Diagnosis not present

## 2019-06-17 DIAGNOSIS — G4733 Obstructive sleep apnea (adult) (pediatric): Secondary | ICD-10-CM | POA: Insufficient documentation

## 2019-06-17 DIAGNOSIS — Z79899 Other long term (current) drug therapy: Secondary | ICD-10-CM | POA: Diagnosis not present

## 2019-06-17 DIAGNOSIS — Z87891 Personal history of nicotine dependence: Secondary | ICD-10-CM | POA: Insufficient documentation

## 2019-06-17 DIAGNOSIS — I2510 Atherosclerotic heart disease of native coronary artery without angina pectoris: Secondary | ICD-10-CM | POA: Insufficient documentation

## 2019-06-17 DIAGNOSIS — Z01818 Encounter for other preprocedural examination: Secondary | ICD-10-CM | POA: Insufficient documentation

## 2019-06-17 DIAGNOSIS — I252 Old myocardial infarction: Secondary | ICD-10-CM | POA: Insufficient documentation

## 2019-06-17 DIAGNOSIS — N189 Chronic kidney disease, unspecified: Secondary | ICD-10-CM | POA: Insufficient documentation

## 2019-06-17 HISTORY — DX: Myoneural disorder, unspecified: G70.9

## 2019-06-17 HISTORY — DX: Pneumonia, unspecified organism: J18.9

## 2019-06-17 LAB — BASIC METABOLIC PANEL
Anion gap: 11 (ref 5–15)
BUN: 13 mg/dL (ref 8–23)
CO2: 31 mmol/L (ref 22–32)
Calcium: 8.9 mg/dL (ref 8.9–10.3)
Chloride: 96 mmol/L — ABNORMAL LOW (ref 98–111)
Creatinine, Ser: 1.07 mg/dL (ref 0.61–1.24)
GFR calc Af Amer: >60 mL/min (ref >60)
GFR calc non Af Amer: >60 mL/min (ref >60)
Glucose, Bld: 101 mg/dL — ABNORMAL HIGH (ref 70–99)
Potassium: 4.1 mmol/L (ref 3.5–5.1)
Sodium: 138 mmol/L (ref 135–145)

## 2019-06-17 LAB — CBC
HCT: 40.7 % (ref 39.0–52.0)
Hemoglobin: 12.7 g/dL — ABNORMAL LOW (ref 13.0–17.0)
MCH: 29.3 pg (ref 26.0–34.0)
MCHC: 31.2 g/dL (ref 30.0–36.0)
MCV: 94 fL (ref 80.0–100.0)
Platelets: 162 10*3/uL (ref 150–400)
RBC: 4.33 MIL/uL (ref 4.22–5.81)
RDW: 15 % (ref 11.5–15.5)
WBC: 7.1 10*3/uL (ref 4.0–10.5)
nRBC: 0 % (ref 0.0–0.2)

## 2019-06-17 NOTE — Telephone Encounter (Signed)
   [facility] Pre-operative Risk Assessment    HEARTCARE STAFF: - Please ensure there is not already an duplicate clearance open for this procedure. - Under Visit Info/Reason for Call, type in Other and utilize the format Clearance MM/DD/YY or Clearance TBD. Do not use dashes or single digits. - If request is for dental extraction, please clarify the # of teeth to be extracted.  Request for surgical clearance:  1. What type of surgery is being performed? Laparoscopic cholecystectomy   2. When is this surgery scheduled? 06/21/2019   3. What type of clearance is required (medical clearance vs. Pharmacy clearance to hold med vs. Both)? both  4. Are there any medications that need to be held prior to surgery and how long? aspiriin   5. Practice name and name of physician performing surgery? [facility]   6. What is the office phone number? [phone]   7.   What is the office fax number? [phone]  8.   Anesthesia type (None, local, MAC, general) ? unknown   Sherrie Mustache 06/17/2019, 4:53 PM  _________________________________________________________________   (provider comments below)

## 2019-06-17 NOTE — Telephone Encounter (Signed)
   Primary Cardiologist:Thomas Claiborne Billings, MD  Chart reviewed as part of pre-operative protocol coverage. Because of Joseph Bryan's past medical history and time since last visit, they will require a follow-up visit in order to better assess preoperative cardiovascular risk.  Pre-op covering staff: - Please schedule appointment and call patient to inform them. If patient already had an upcoming appointment within acceptable timeframe, please add "pre-op clearance" to the appointment notes so provider is aware. - Please contact requesting surgeon's office via preferred method (i.e, phone, fax) to inform them of need for appointment prior to surgery.  If applicable, this message will also be routed to pharmacy pool and/or primary cardiologist for input on holding anticoagulant/antiplatelet agent as requested below so that this information is available to the clearing provider at time of patient's appointment.   Please check to see if can arrange virtual visit on Monday for this patient  Almyra Deforest, [place]  06/17/2019, 6:17 PM

## 2019-06-18 LAB — SARS CORONAVIRUS 2 (TAT 6-24 HRS): SARS Coronavirus 2: NEGATIVE

## 2019-06-19 ENCOUNTER — Encounter (HOSPITAL_COMMUNITY): Payer: Self-pay | Admitting: Surgery

## 2019-06-20 ENCOUNTER — Ambulatory Visit: Payer: Medicare Other | Admitting: General Practice

## 2019-06-20 ENCOUNTER — Other Ambulatory Visit: Payer: Self-pay

## 2019-06-20 ENCOUNTER — Encounter: Payer: Self-pay | Admitting: General Practice

## 2019-06-20 VITALS — BP 130/58 | HR 77 | Ht 73.0 in | Wt 279.4 lb

## 2019-06-20 DIAGNOSIS — I10 Essential (primary) hypertension: Secondary | ICD-10-CM | POA: Diagnosis not present

## 2019-06-20 DIAGNOSIS — I2583 Coronary atherosclerosis due to lipid rich plaque: Secondary | ICD-10-CM

## 2019-06-20 DIAGNOSIS — I2510 Atherosclerotic heart disease of native coronary artery without angina pectoris: Secondary | ICD-10-CM | POA: Diagnosis not present

## 2019-06-20 DIAGNOSIS — Z01810 Encounter for preprocedural cardiovascular examination: Secondary | ICD-10-CM | POA: Diagnosis not present

## 2019-06-20 DIAGNOSIS — R600 Localized edema: Secondary | ICD-10-CM | POA: Diagnosis not present

## 2019-06-20 MED ORDER — DEXTROSE 5 % IV SOLN
3.0000 g | INTRAVENOUS | Status: AC
  Administered 2019-06-21: 3 g via INTRAVENOUS
  Filled 2019-06-20: qty 3

## 2019-06-20 NOTE — Anesthesia Preprocedure Evaluation (Addendum)
Anesthesia Evaluation  Patient identified by MRN, date of birth, ID band Patient awake    Reviewed: Allergy & Precautions, H&P , NPO status , Patient's Chart, lab work & pertinent test results  Airway Mallampati: I  TM Distance: >3 FB Neck ROM: Full    Dental no notable dental hx. (+) Edentulous Upper, Edentulous Lower   Pulmonary neg pulmonary ROS, sleep apnea and Continuous Positive Airway Pressure Ventilation , former smoker,    Pulmonary exam normal breath sounds clear to auscultation       Cardiovascular Exercise Tolerance: Good hypertension, Pt. on medications and Pt. on home beta blockers + CAD, + Past MI and + CABG  negative cardio ROS Normal cardiovascular exam Rhythm:Regular Rate:Normal  ECHO 18' - Left ventricle: The cavity size was mildly dilated. There was   mild concentric hypertrophy. Systolic function was mildly   reduced. 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 Diffuse hypokinesis. Doppler parameters are consistent   with abnormal left ventricular relaxation (grade 1 diastolic   dysfunction). Doppler parameters are consistent with   indeterminate ventricular filling pressure. - Aortic valve: Transvalvular velocity was within the normal range.   There was no stenosis. There was mild regurgitation.   Myoview 50'  The left ventricular ejection fraction is moderately decreased (30-44%).  Nuclear stress EF: 36%.  There was no ST segment deviation noted during stress.  There is a medium defect of moderate severity present in the apical anterior, apical septal and apex location. The defect is non-reversible. This is consistent with infarct. No ischemia noted.  This is an intermediate risk study.  There is evidence of transient ischemic dilatation with a TID of 1.27 which can be seen in multivessel CAD.   Neuro/Psych  Neuromuscular disease negative neurological ROS  negative psych ROS    GI/Hepatic negative GI ROS, Neg liver ROS,   Endo/Other  negative endocrine ROSMorbid obesity  Renal/GU Renal diseasenegative Renal ROS  negative genitourinary   Musculoskeletal negative musculoskeletal ROS (+)   Abdominal   Peds negative pediatric ROS (+)  Hematology negative hematology ROS (+)   Anesthesia Other Findings   Reproductive/Obstetrics negative OB ROS                           Anesthesia Physical Anesthesia Plan  ASA: III  Anesthesia Plan: General   Post-op Pain Management:    Induction: Intravenous  PONV Risk Score and Plan: 2 and Ondansetron, Dexamethasone and Treatment may vary due to age or medical condition  Airway Management Planned: Oral ETT  Additional Equipment:   Intra-op Plan:   Post-operative Plan: Extubation in OR  Informed Consent: I have reviewed the patients History and Physical, chart, labs and discussed the procedure including the risks, benefits and alternatives for the proposed anesthesia with the patient or authorized representative who has indicated his/her understanding and acceptance.       Plan Discussed with: Anesthesiologist and CRNA  Anesthesia Plan Comments: (  Pt seen by cardiology in office 06/20/2019.  Per OV note, "Chart reviewed as part of pre-operative protocol coverage. Given past medical history and time since last visit, based on ACC/AHA guidelines, Joseph Bryan would be at acceptable risk for the planned procedure without further cardiovascular testing.   His RCRI is a class IV risk, 11% risk of major cardiac event.  He is able to complete greater than 4 METS of physical activity.")      Anesthesia  Quick Evaluation

## 2019-06-20 NOTE — Progress Notes (Signed)
Anesthesia Chart Review   Case: [id number] Date/Time: 06/21/19 0715   Procedure: LAPAROSCOPIC CHOLECYSTECTOMY WITH INTRAOPERATIVE CHOLANGIOGRAM (N/A )   Anesthesia type: General   Pre-op diagnosis: CHRONIC CHOLECYSTITIS CHOLELITHIASIS   [facility]   Surgeons: Armandina Gemma, MD      DISCUSSION:78 y.o. 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 Armandina Gemma.   Cardiac clearance requested.  Per cardiology pt will require follow up visit in order to assess cardiovascular risk.    Addendum:  Pt seen by cardiology in office 06/20/2019.  Per OV note, "Chart reviewed as part of pre-operative protocol coverage. Given past medical history and time since last visit, based on ACC/AHA guidelines, Joseph Bryan would be at acceptable risk for the planned procedure without further cardiovascular testing.   His RCRI is a class IV risk, 11% risk of major cardiac event.  He is able to complete greater than 4 METS of physical activity."  Anticipate pt can proceed with planned procedure barring acute status change.   VS: BP 136/69   Pulse 76   Temp 36.6 C (Oral)   Resp 18   Ht 6\' 1"  (1.854 m)   Wt 125.7 kg   SpO2 97%   BMI 36.55 kg/m   PROVIDERS: Leonard Downing, MD is PCP   Shelva Majestic, MD is Cardiologist  LABS: Labs reviewed: Acceptable for surgery. (all labs ordered are listed, but only abnormal results are displayed)  Labs Reviewed - No data to display   IMAGES:   EKG: 12/21/2018 Rate 62 bpm  Normal sinus rhythm  Right bundle branch block  Left anterior fascicular block  Minimal voltage criteria for LVH, may be normal variant Septal infarct, age undetermined  CV: Echo 10/17/2016 Study Conclusions   - Left ventricle: The cavity size was mildly dilated.  There was  mild concentric hypertrophy. Systolic function was mildly  reduced. The estimated ejection fraction was in the range of 45%  to 50%. Diffuse hypokinesis. Doppler parameters are consistent  with abnormal left ventricular relaxation (grade 1 diastolic  dysfunction). Doppler parameters are consistent with  indeterminate ventricular filling pressure.  - Aortic valve: Transvalvular velocity was within the normal range.  There was no stenosis. There was mild regurgitation.  - Mitral valve: Transvalvular velocity was within the normal range.  There was no evidence for stenosis. There was trivial  regurgitation.  - Left atrium: The atrium was mildly to moderately dilated.  - Right ventricle: The cavity size was normal. Wall thickness was  normal. Systolic function was normal.  - Tricuspid valve: There was moderate regurgitation.  - Pulmonary arteries: Systolic pressure was within the normal  range. PA peak pressure: 35 mm Hg (S).   Myocardial Perfusion 08/20/2016  The left ventricular ejection fraction is moderately decreased (30-44%).  Nuclear stress EF: 36%.  There was no ST segment deviation noted during stress.  There is a medium defect of moderate severity present in the apical anterior, apical septal and apex location. The defect is non-reversible. This is consistent with infarct. No ischemia noted.  This is an intermediate risk study.  There is evidence of transient ischemic dilatation with a TID of 1.27 which can be seen in multivessel CAD Past Medical History:  Diagnosis Date  . 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 Coronary artery disease   . Glaucoma   . Heart disease   . History of kidney stones   . Hyperlipidemia   . Hypertension   . Myocardial infarction ([place]) 17 yrs ago  . Neuromuscular disorder (HCC)    neuropathy feet  . Neuropathy    feet  . Pneumonia    . Sleep apnea with use of continuous positive airway pressure (CPAP)    uses CPAP    Past Surgical History:  Procedure Laterality Date  . APPENDECTOMY  2014  . COLON RESECTION N/A 08/27/2012   Procedure: Diagnostic laparoscopy; Ileocecectomy;  Surgeon: Madilyn Hook, DO;  [facility];  Service: General;  Laterality: N/A;  . COLONOSCOPY WITH PROPOFOL N/A 04/09/2015   Procedure: COLONOSCOPY WITH PROPOFOL;  Surgeon: Garlan Fair, MD;  [facility];  Service: Endoscopy;  Laterality: N/A;  . CORONARY ARTERY BYPASS GRAFT  1998   x5  . CYSTOSCOPY/URETEROSCOPY/HOLMIUM LASER/STENT PLACEMENT Right 02/25/2018   Procedure: RIGHT URETEROSCOPY/HOLMIUM LASER/ STONE REMOVAL  STENT PLACEMENT;  Surgeon: Ardis Hughs, MD;  [facility];  Service: Urology;  Laterality: Right;  . EYE SURGERY     cataract and glaucome  . LUMBAR LAMINECTOMY/DECOMPRESSION MICRODISCECTOMY N/A 10/19/2015   Procedure: Lumbar three to four LAMINECTOMY/FORAMINOTOMY;  Surgeon: Erline Levine, MD;  Location: [place];  Service: Neurosurgery;  Laterality: N/A;  . 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 Ascorbic Acid (VITAMIN C) 1000 MG tablet  . aspirin EC 81 MG tablet  . atenolol (TENORMIN) 50 MG tablet  . BEE POLLEN PO  . Cyanocobalamin (VITAMIN B-12 PO)  . gabapentin (NEURONTIN) 300 MG capsule  . metolazone (ZAROXOLYN) 2.5 MG tablet  . metolazone (ZAROXOLYN) 5 MG tablet  . potassium chloride SA (KLOR-CON) 20 MEQ tablet  . pyridOXINE (VITAMIN B-6) 100 MG tablet  . rosuvastatin (CRESTOR) 20 MG tablet  . torsemide (DEMADEX) 20 MG tablet  . traMADol (ULTRAM) 50 MG tablet   No current facility-administered medications for this encounter.   [name] ON 06/21/2019] ceFAZolin (ANCEF) 3 g in dextrose 5 % 50 mL IVPB     [name] [facility]Surgical Testing [phone] 06/20/19  3:59 PM

## 2019-06-20 NOTE — Telephone Encounter (Signed)
Kelly with [facility] is calling to follow up in regards to request for cardiac clearance. She states the patient's procedure is scheduled for 06/21/19. Please call to discuss.

## 2019-06-20 NOTE — Patient Instructions (Addendum)
Medication Instructions:  Your physician recommends that you continue on your current medications as directed. Please refer to the Current Medication list given to you today.  *If you need a refill on your cardiac medications before your next appointment, please call your pharmacy*   Lab Work: None ordered  If you have labs (blood work) drawn today and your tests are completely normal, you will receive your results only by: Marland Kitchen MyChart Message (if you have MyChart) OR . A paper copy in the mail If you have any lab test that is abnormal or we need to change your treatment, we will call you to review the results.   Testing/Procedures: None ordered   Follow-Up: At [facility], you and your health needs are our priority.  As part of our continuing mission to provide you with exceptional heart care, we have created designated Provider Care Teams.  These Care Teams include your primary Cardiologist (physician) and Advanced Practice Providers (APPs -  Physician Assistants and Nurse Practitioners) who all work together to provide you with the care you need, when you need it.  We recommend signing up for the patient portal called "MyChart".  Sign up information is provided on this After Visit Summary.  MyChart is used to connect with patients for Virtual Visits (Telemedicine).  Patients are able to view lab/test results, encounter notes, upcoming appointments, etc.  Non-urgent messages can be sent to your provider as well.   To learn more about what you can do with MyChart, go to [email or web address].    Your next appointment:   6 month(s)  The format for your next appointment:   In Person  Provider:   You may see Shelva Majestic, MD or one of the following Advanced Practice Providers on your designated Care Team:    Almyra Deforest, PA-C  Fabian Sharp, PA-C or   Roby Lofts, [place]    Other Instructions You have been cleared for surgery.  We will send the appropriate paperwork to your  surgeon's office.  Please increase your Physical Activity as tolerated

## 2019-06-20 NOTE — Progress Notes (Addendum)
Cardiology Clinic Note   Patient Name: Joseph Bryan Date of Encounter: 06/20/2019  Primary Care Provider:  Leonard Downing, MD Primary Cardiologist:  Shelva Majestic, MD  Patient Profile    [name] is 79 year old male presents to the clinic today for preop cardiac evaluation.  Past Medical History    Past Medical History:  Diagnosis Date  . 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 Coronary artery disease   . Glaucoma   . Heart disease   . History of kidney stones   . Hyperlipidemia   . Hypertension   . Myocardial infarction ([place]) 17 yrs ago  . Neuromuscular disorder (HCC)    neuropathy feet  . Neuropathy    feet  . Pneumonia   . Sleep apnea with use of continuous positive airway pressure (CPAP)    uses CPAP   Past Surgical History:  Procedure Laterality Date  . APPENDECTOMY  2014  . COLON RESECTION N/A 08/27/2012   Procedure: Diagnostic laparoscopy; Ileocecectomy;  Surgeon: Madilyn Hook, DO;  [facility];  Service: General;  Laterality: N/A;  . COLONOSCOPY WITH PROPOFOL N/A 04/09/2015   Procedure: COLONOSCOPY WITH PROPOFOL;  Surgeon: Garlan Fair, MD;  [facility];  Service: Endoscopy;  Laterality: N/A;  . CORONARY ARTERY BYPASS GRAFT  1998   x5  . CYSTOSCOPY/URETEROSCOPY/HOLMIUM LASER/STENT PLACEMENT Right 02/25/2018   Procedure: RIGHT URETEROSCOPY/HOLMIUM LASER/ STONE REMOVAL  STENT PLACEMENT;  Surgeon: Ardis Hughs, MD;  [facility];  Service: Urology;  Laterality: Right;  . EYE SURGERY     cataract and glaucome  . LUMBAR LAMINECTOMY/DECOMPRESSION MICRODISCECTOMY N/A 10/19/2015   Procedure: Lumbar three to four LAMINECTOMY/FORAMINOTOMY;  Surgeon: Erline Levine, MD;  Location: [place];  Service: Neurosurgery;  Laterality: N/A;  . 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 Other Rash and Other (See Comments)    CHG soap and wipes    History of Present Illness    [name] is a past medical history of hypertension, coronary artery disease status post CABG in 1998 (patent grafts cardiac catheterization 2010).  RBBB, OSA on CPAP  He was seen by Dr. Claiborne Billings via virtual visit 06/16/2018.  At that time he denied chest pain or shortness of breath.  He was taking metolazone 2.5 mg Tuesday and Friday.  He was also taking torsemide 20 mg twice daily.  He was 100% compliant with his CPAP however his machine has malfunctioned and stopped working.  He had not been sleeping well since his machine was broken.  He stated he was waking up 3-4 times per night with nocturia.  At that time he was experiencing lower extremity edema.  He presents the clinic today for preop cardiac evaluation and states he has been quarantining for the last 4 days.  He has been somewhat physically active doing yard work and housework.  He states he routinely breaks his yard and picks up sticks as well as cutting his grass.  He follows a low-sodium diet and has not had any increase in his lower extremity swelling.  He was unaware that he needed cardiac clearance prior to his laparoscopic cholecystectomy.  He will present for surgery tomorrow with Dr. Deirdre Pippins.  Today he denies chest pain, shortness of breath, lower  extremity edema, fatigue, palpitations, melena, hematuria, hemoptysis, diaphoresis, weakness, presyncope, syncope, orthopnea, and PND.   Home Medications    Prior to Admission medications   Medication Sig Start Date End Date Taking? Authorizing Provider  Ascorbic Acid (VITAMIN C) 1000 MG tablet Take 1,000 mg by mouth daily.    [provider]  aspirin EC 81 MG tablet Take 1 tablet (81 mg total) by mouth daily. 12/21/18   Troy Sine, MD  atenolol (TENORMIN) 50 MG tablet Take 1.5 tablets (75 mg total) by mouth daily. Patient taking differently: Take 75 mg by mouth at bedtime.   06/01/19   Troy Sine, MD  BEE POLLEN PO Take 3 capsules by mouth daily.     [provider]  Cyanocobalamin (VITAMIN B-12 PO) Take 2,000 mcg by mouth daily.     [provider]  gabapentin (NEURONTIN) 300 MG capsule TAKE 1 CAPSULE BY MOUTH TWO TIMES DAILY Patient taking differently: Take 300 mg by mouth daily.  09/30/18   Troy Sine, MD  metolazone (ZAROXOLYN) 2.5 MG tablet TAKE EVERY OTHER DAY (20-30 MINUTES PRIOR TOMORNING DOSE OF TORSEMIDE) Patient not taking: Reported on 06/09/2019 12/21/18   Troy Sine, MD  metolazone (ZAROXOLYN) 5 MG tablet Take 2.5 mg by mouth 2 (two) times a week. 01/03/19   [provider]  potassium chloride SA (KLOR-CON) 20 MEQ tablet TAKE 1 TABLET BY MOUTH DAILY DAILY WHEN YOU TAKE A METOLAZONE Patient taking differently: Take 20 mEq by mouth 2 (two) times a week.  12/23/18   Troy Sine, MD  pyridOXINE (VITAMIN B-6) 100 MG tablet Take 100 mg by mouth daily.    [provider]  rosuvastatin (CRESTOR) 20 MG tablet TAKE 1 TABLET BY MOUTH DAILY Patient taking differently: Take 20 mg by mouth at bedtime.  04/14/19   Troy Sine, MD  torsemide (DEMADEX) 20 MG tablet Take 1 tablet (20 mg total) by mouth daily. Patient taking differently: Take 20 mg by mouth 2 (two) times daily.  06/01/19   Troy Sine, MD  traMADol (ULTRAM) 50 MG tablet Take 1-2 tablets (50-100 mg total) by mouth every 6 (six) hours as needed for moderate pain. Patient not taking: Reported on 06/09/2019 02/25/18   Ardis Hughs, MD    Family History    Family History  Problem Relation Age of Onset  . Hypertension Father   . Hypertension Sister   . Heart attack Brother    He indicated that his mother is deceased. He indicated that his father is deceased. He indicated that his sister is deceased. He indicated that his brother is deceased.  Social History    Social History   Socioeconomic History  . Marital status: Married    Spouse  name: Not on file  . Number of children: Not on file  . Years of education: Not on file  . Highest education level: Not on file  Occupational History  . Not on file  Tobacco Use  . Smoking status: Former Smoker    Types: Cigarettes    Quit date: 08/25/1988    Years since quitting: 30.8  . Smokeless tobacco: Never Used  Substance and Sexual Activity  . Alcohol use: No    Alcohol/week: 0.0 standard drinks  . Drug use: No  . Sexual activity: Not Currently  Other Topics Concern  . Not on file  Social History Narrative  . Not on file   Social Determinants of Health   Financial Resource  Strain:   . Difficulty of Paying Living Expenses:   Food Insecurity:   . Worried About Charity fundraiser in the Last Year:   . Arboriculturist in the Last Year:   Transportation Needs:   . Film/video editor (Medical):   Marland Kitchen Lack of Transportation (Non-Medical):   Physical Activity:   . Days of Exercise per Week:   . Minutes of Exercise per Session:   Stress:   . Feeling of Stress :   Social Connections:   . Frequency of Communication with Friends and Family:   . Frequency of Social Gatherings with Friends and Family:   . Attends Religious Services:   . Active Member of Clubs or Organizations:   . Attends Archivist Meetings:   Marland Kitchen Marital Status:   Intimate Partner Violence:   . Fear of Current or Ex-Partner:   . Emotionally Abused:   Marland Kitchen Physically Abused:   . Sexually Abused:      Review of Systems    General:  No chills, fever, night sweats or weight changes.  Cardiovascular:  No chest pain, dyspnea on exertion, edema, orthopnea, palpitations, paroxysmal nocturnal dyspnea. Dermatological: No rash, lesions/masses Respiratory: No cough, dyspnea Urologic: No hematuria, dysuria Abdominal:   No nausea, vomiting, diarrhea, bright red blood per rectum, melena, or hematemesis Neurologic:  No visual changes, wkns, changes in mental status. All other systems reviewed and are  otherwise negative except as noted above.  Physical Exam    VS:  BP (!) 130/58 (BP Location: Left Arm, Patient Position: Sitting, Cuff Size: Normal)   Pulse 77   Ht 6\' 1"  (1.854 m)   Wt 279 lb 6.4 oz (126.7 kg)   SpO2 96% Comment: at rest walking in 85  BMI 36.86 kg/m  , BMI Body mass index is 36.86 kg/m. GEN: Well nourished, well developed, in no acute distress. HEENT: normal. Neck: Supple, no JVD, carotid bruits, or masses. Cardiac: RRR, no murmurs, rubs, or gallops. No clubbing, cyanosis, +1 pitting bilateral lower extremity edema.  Radials/DP/PT 2+ and equal bilaterally.  Respiratory:  Respirations regular and unlabored, clear to auscultation bilaterally. GI: Soft, nontender, nondistended, BS + x 4. MS: no deformity or atrophy. Skin: warm and dry, no rash. Neuro:  Strength and sensation are intact. Psych: Normal affect.  Accessory Clinical Findings    ECG personally reviewed by me today-normal sinus rhythm left axis deviation RBBB left ventricular hypertrophy septal infarct undetermined age 13 bpm- No acute changes  EKG 12/21/2018 Normal sinus rhythm RBBB left anterior fascicular block 62 bpm  Echocardiogram 10/17/2016 Study Conclusions   - Left ventricle: The cavity size was mildly dilated. There was  mild concentric hypertrophy. Systolic function was mildly  reduced. The estimated ejection fraction was in the range of 45%  to 50%. Diffuse hypokinesis. Doppler parameters are consistent  with abnormal left ventricular relaxation (grade 1 diastolic  dysfunction). Doppler parameters are consistent with  indeterminate ventricular filling pressure.  - Aortic valve: Transvalvular velocity was within the normal range.  There was no stenosis. There was mild regurgitation.  - Mitral valve: Transvalvular velocity was within the normal range.  There was no evidence for stenosis. There was trivial  regurgitation.  - Left atrium: The atrium was mildly to  moderately dilated.  - Right ventricle: The cavity size was normal. Wall thickness was  normal. Systolic function was normal.  - Tricuspid valve: There was moderate regurgitation.  - Pulmonary arteries: Systolic pressure was within the normal  range. PA peak pressure: 35 mm Hg (S).    Assessment & Plan   1.  Coronary artery disease-no chest pain today.  No increased dyspnea or activity intolerance.  Status post CABG 1998, cardiac catheterization 2010 showed patent grafts. Continue atenolol, Crestor Heart healthy low-sodium diet-salty 6 given Increase physical activity as tolerated  Essential hypertension-BP today 130/58 Continue atenolol, torsemide Heart healthy low-sodium diet-salty 6 given Increase physical activity as tolerated  Lower extremity edema-+1 pitting bilateral lower extremity edema. Continue Zaroxolyn, potassium, torsemide  Preoperative cardiac evaluation-laparoscopic cholecystectomy scheduled for 06/21/2019, [place] [facility] fax [phone] (Dr. Deirdre Pippins     Primary Cardiologist: Shelva Majestic, MD  Chart reviewed as part of pre-operative protocol coverage. Given past medical history and time since last visit, based on ACC/AHA guidelines, NIRANJAN RUFENER would be at acceptable risk for the planned procedure without further cardiovascular testing.   His RCRI is a class IV risk, 11% risk of major cardiac event.  He is able to complete greater than 4 METS of physical activity.  I will route this recommendation to the requesting party via Epic fax function and remove from pre-op pool.  Please call with questions.   Disposition :follow-up with Dr. Claiborne Billings in 6 months.   Jossie Ng. Trenyce Loera NP-C    06/20/2019, 3:28 PM [facility] [address] Office [phone] Fax [phone]

## 2019-06-21 ENCOUNTER — Ambulatory Visit (HOSPITAL_COMMUNITY): Payer: Medicare Other | Admitting: Physician Assistant

## 2019-06-21 ENCOUNTER — Encounter (HOSPITAL_COMMUNITY)
Admission: RE | Disposition: A | Discharge status: Home / Self Care | Payer: Self-pay | Source: Home / Self Care | Attending: Surgery

## 2019-06-21 ENCOUNTER — Ambulatory Visit (HOSPITAL_COMMUNITY): Payer: Medicare Other

## 2019-06-21 ENCOUNTER — Ambulatory Visit (HOSPITAL_COMMUNITY): Payer: Medicare Other | Admitting: Anesthesiology

## 2019-06-21 ENCOUNTER — Inpatient Hospital Stay (HOSPITAL_COMMUNITY)
Admission: RE | Admit: 2019-06-21 | Discharge: 2019-06-23 | DRG: 417 | Disposition: A | Discharge status: Home / Self Care | Payer: Medicare Other | Attending: Surgery | Admitting: Surgery

## 2019-06-21 ENCOUNTER — Encounter (HOSPITAL_COMMUNITY): Payer: Self-pay | Admitting: Surgery

## 2019-06-21 DIAGNOSIS — I2510 Atherosclerotic heart disease of native coronary artery without angina pectoris: Secondary | ICD-10-CM | POA: Diagnosis present

## 2019-06-21 DIAGNOSIS — G4730 Sleep apnea, unspecified: Secondary | ICD-10-CM | POA: Diagnosis present

## 2019-06-21 DIAGNOSIS — I2699 Other pulmonary embolism without acute cor pulmonale: Secondary | ICD-10-CM | POA: Diagnosis present

## 2019-06-21 DIAGNOSIS — K259 Gastric ulcer, unspecified as acute or chronic, without hemorrhage or perforation: Secondary | ICD-10-CM | POA: Diagnosis present

## 2019-06-21 DIAGNOSIS — K8010 Calculus of gallbladder with chronic cholecystitis without obstruction: Secondary | ICD-10-CM | POA: Diagnosis present

## 2019-06-21 DIAGNOSIS — Y92009 Unspecified place in unspecified non-institutional (private) residence as the place of occurrence of the external cause: Secondary | ICD-10-CM

## 2019-06-21 DIAGNOSIS — H409 Unspecified glaucoma: Secondary | ICD-10-CM | POA: Diagnosis present

## 2019-06-21 DIAGNOSIS — S2231XA Fracture of one rib, right side, initial encounter for closed fracture: Secondary | ICD-10-CM | POA: Diagnosis present

## 2019-06-21 DIAGNOSIS — Z8249 Family history of ischemic heart disease and other diseases of the circulatory system: Secondary | ICD-10-CM

## 2019-06-21 DIAGNOSIS — R748 Abnormal levels of other serum enzymes: Secondary | ICD-10-CM | POA: Diagnosis present

## 2019-06-21 DIAGNOSIS — Z7982 Long term (current) use of aspirin: Secondary | ICD-10-CM

## 2019-06-21 DIAGNOSIS — I10 Essential (primary) hypertension: Secondary | ICD-10-CM | POA: Diagnosis present

## 2019-06-21 DIAGNOSIS — Z87891 Personal history of nicotine dependence: Secondary | ICD-10-CM

## 2019-06-21 DIAGNOSIS — N200 Calculus of kidney: Secondary | ICD-10-CM | POA: Diagnosis present

## 2019-06-21 DIAGNOSIS — Z79899 Other long term (current) drug therapy: Secondary | ICD-10-CM

## 2019-06-21 DIAGNOSIS — W19XXXA Unspecified fall, initial encounter: Secondary | ICD-10-CM | POA: Diagnosis present

## 2019-06-21 DIAGNOSIS — Z419 Encounter for procedure for purposes other than remedying health state, unspecified: Secondary | ICD-10-CM

## 2019-06-21 DIAGNOSIS — Z20822 Contact with and (suspected) exposure to covid-19: Secondary | ICD-10-CM | POA: Diagnosis present

## 2019-06-21 DIAGNOSIS — Z86010 Personal history of colonic polyps: Secondary | ICD-10-CM

## 2019-06-21 DIAGNOSIS — Z6836 Body mass index (BMI) 36.0-36.9, adult: Secondary | ICD-10-CM

## 2019-06-21 DIAGNOSIS — K8064 Calculus of gallbladder and bile duct with chronic cholecystitis without obstruction: Principal | ICD-10-CM | POA: Diagnosis present

## 2019-06-21 DIAGNOSIS — I252 Old myocardial infarction: Secondary | ICD-10-CM

## 2019-06-21 DIAGNOSIS — E785 Hyperlipidemia, unspecified: Secondary | ICD-10-CM | POA: Diagnosis present

## 2019-06-21 DIAGNOSIS — K819 Cholecystitis, unspecified: Secondary | ICD-10-CM

## 2019-06-21 DIAGNOSIS — K760 Fatty (change of) liver, not elsewhere classified: Secondary | ICD-10-CM | POA: Diagnosis present

## 2019-06-21 DIAGNOSIS — Z951 Presence of aortocoronary bypass graft: Secondary | ICD-10-CM

## 2019-06-21 DIAGNOSIS — G629 Polyneuropathy, unspecified: Secondary | ICD-10-CM | POA: Diagnosis present

## 2019-06-21 HISTORY — PX: CHOLECYSTECTOMY: SHX55

## 2019-06-21 SURGERY — LAPAROSCOPIC CHOLECYSTECTOMY WITH INTRAOPERATIVE CHOLANGIOGRAM
Anesthesia: General

## 2019-06-21 MED ORDER — ORAL CARE MOUTH RINSE
15.0000 mL | Freq: Once | OROMUCOSAL | Status: AC
  Administered 2019-06-21: 15 mL via OROMUCOSAL

## 2019-06-21 MED ORDER — PHENYLEPHRINE 40 MCG/ML (10ML) SYRINGE FOR IV PUSH (FOR BLOOD PRESSURE SUPPORT)
PREFILLED_SYRINGE | INTRAVENOUS | Status: DC | PRN
  Administered 2019-06-21: 40 ug via INTRAVENOUS
  Administered 2019-06-21: 80 ug via INTRAVENOUS
  Administered 2019-06-21: 40 ug via INTRAVENOUS
  Administered 2019-06-21: 80 ug via INTRAVENOUS

## 2019-06-21 MED ORDER — HYDROMORPHONE HCL 1 MG/ML IJ SOLN: 1.0000 mg | INTRAMUSCULAR | Status: DC | PRN

## 2019-06-21 MED ORDER — SODIUM CHLORIDE 0.9 % IV SOLN
2.0000 g | INTRAVENOUS | Status: DC
  Administered 2019-06-21 – 2019-06-22 (×2): 2 g via INTRAVENOUS
  Filled 2019-06-21: qty 20
  Filled 2019-06-21: qty 2
  Filled 2019-06-21 (×2): qty 20

## 2019-06-21 MED ORDER — FENTANYL CITRATE (PF) 100 MCG/2ML IJ SOLN
INTRAMUSCULAR | Status: AC
  Filled 2019-06-21: qty 2

## 2019-06-21 MED ORDER — CHLORHEXIDINE GLUCONATE CLOTH 2 % EX PADS: 6.0000 | MEDICATED_PAD | Freq: Once | CUTANEOUS | Status: DC

## 2019-06-21 MED ORDER — ACETAMINOPHEN 650 MG RE SUPP: 650.0000 mg | Freq: Four times a day (QID) | RECTAL | Status: DC | PRN

## 2019-06-21 MED ORDER — ONDANSETRON HCL 4 MG/2ML IJ SOLN
INTRAMUSCULAR | Status: DC | PRN
  Administered 2019-06-21: 4 mg via INTRAVENOUS

## 2019-06-21 MED ORDER — FENTANYL CITRATE (PF) 100 MCG/2ML IJ SOLN
25.0000 ug | INTRAMUSCULAR | Status: DC | PRN
  Administered 2019-06-21 (×3): 50 ug via INTRAVENOUS

## 2019-06-21 MED ORDER — BUPIVACAINE-EPINEPHRINE 0.5% -1:200000 IJ SOLN
INTRAMUSCULAR | Status: AC
  Filled 2019-06-21: qty 1

## 2019-06-21 MED ORDER — ONDANSETRON 4 MG PO TBDP: 4.0000 mg | ORAL_TABLET | Freq: Four times a day (QID) | ORAL | Status: DC | PRN

## 2019-06-21 MED ORDER — FENTANYL CITRATE (PF) 100 MCG/2ML IJ SOLN
INTRAMUSCULAR | Status: DC | PRN
  Administered 2019-06-21: 25 ug via INTRAVENOUS
  Administered 2019-06-21: 100 ug via INTRAVENOUS
  Administered 2019-06-21: 50 ug via INTRAVENOUS
  Administered 2019-06-21: 25 ug via INTRAVENOUS

## 2019-06-21 MED ORDER — DEXAMETHASONE SODIUM PHOSPHATE 10 MG/ML IJ SOLN
INTRAMUSCULAR | Status: AC
  Filled 2019-06-21: qty 2

## 2019-06-21 MED ORDER — LIDOCAINE 2% (20 MG/ML) 5 ML SYRINGE
INTRAMUSCULAR | Status: DC | PRN
  Administered 2019-06-21: 80 mg via INTRAVENOUS

## 2019-06-21 MED ORDER — GABAPENTIN 300 MG PO CAPS
300.0000 mg | ORAL_CAPSULE | Freq: Every day | ORAL | Status: DC
  Administered 2019-06-21 – 2019-06-23 (×3): 300 mg via ORAL
  Filled 2019-06-21 (×3): qty 1

## 2019-06-21 MED ORDER — SUGAMMADEX SODIUM 500 MG/5ML IV SOLN
INTRAVENOUS | Status: DC | PRN
  Administered 2019-06-21: 400 mg via INTRAVENOUS

## 2019-06-21 MED ORDER — ONDANSETRON HCL 4 MG/2ML IJ SOLN: 4.0000 mg | Freq: Four times a day (QID) | INTRAMUSCULAR | Status: DC | PRN

## 2019-06-21 MED ORDER — TORSEMIDE 20 MG PO TABS
20.0000 mg | ORAL_TABLET | Freq: Two times a day (BID) | ORAL | Status: DC
  Administered 2019-06-21 – 2019-06-23 (×3): 20 mg via ORAL
  Filled 2019-06-21 (×6): qty 1

## 2019-06-21 MED ORDER — HYDROCODONE-ACETAMINOPHEN 5-325 MG PO TABS
1.0000 | ORAL_TABLET | ORAL | Status: DC | PRN
  Administered 2019-06-21 – 2019-06-23 (×3): 1 via ORAL
  Filled 2019-06-21 (×3): qty 1

## 2019-06-21 MED ORDER — DEXAMETHASONE SODIUM PHOSPHATE 10 MG/ML IJ SOLN
INTRAMUSCULAR | Status: DC | PRN
  Administered 2019-06-21: 5 mg via INTRAVENOUS

## 2019-06-21 MED ORDER — ACETAMINOPHEN 325 MG PO TABS: 325.0000 mg | ORAL_TABLET | ORAL | Status: DC | PRN

## 2019-06-21 MED ORDER — ROCURONIUM BROMIDE 10 MG/ML (PF) SYRINGE
PREFILLED_SYRINGE | INTRAVENOUS | Status: AC
  Filled 2019-06-21: qty 20

## 2019-06-21 MED ORDER — ONDANSETRON HCL 4 MG/2ML IJ SOLN
INTRAMUSCULAR | Status: AC
  Filled 2019-06-21: qty 2

## 2019-06-21 MED ORDER — MEPERIDINE HCL 50 MG/ML IJ SOLN: 6.2500 mg | INTRAMUSCULAR | Status: DC | PRN

## 2019-06-21 MED ORDER — POTASSIUM CHLORIDE IN NACL 20-0.45 MEQ/L-% IV SOLN
INTRAVENOUS | Status: DC
  Filled 2019-06-21 (×3): qty 1000

## 2019-06-21 MED ORDER — ACETAMINOPHEN 160 MG/5ML PO SOLN: 325.0000 mg | ORAL | Status: DC | PRN

## 2019-06-21 MED ORDER — BUPIVACAINE-EPINEPHRINE 0.5% -1:200000 IJ SOLN
INTRAMUSCULAR | Status: DC | PRN
  Administered 2019-06-21: 30 mL

## 2019-06-21 MED ORDER — LACTATED RINGERS IV SOLN: INTRAVENOUS | Status: DC

## 2019-06-21 MED ORDER — SODIUM CHLORIDE (PF) 0.9 % IJ SOLN
INTRAVENOUS | Status: DC | PRN
  Administered 2019-06-21: 20 mL

## 2019-06-21 MED ORDER — PROPOFOL 10 MG/ML IV BOLUS
INTRAVENOUS | Status: DC | PRN
  Administered 2019-06-21: 110 mg via INTRAVENOUS

## 2019-06-21 MED ORDER — DEXAMETHASONE SODIUM PHOSPHATE 10 MG/ML IJ SOLN
INTRAMUSCULAR | Status: AC
  Filled 2019-06-21: qty 1

## 2019-06-21 MED ORDER — LIDOCAINE 2% (20 MG/ML) 5 ML SYRINGE
INTRAMUSCULAR | Status: AC
  Filled 2019-06-21: qty 10

## 2019-06-21 MED ORDER — SUGAMMADEX SODIUM 500 MG/5ML IV SOLN
INTRAVENOUS | Status: AC
  Filled 2019-06-21: qty 5

## 2019-06-21 MED ORDER — OXYCODONE HCL 5 MG PO TABS: 5.0000 mg | ORAL_TABLET | Freq: Once | ORAL | Status: DC | PRN

## 2019-06-21 MED ORDER — ACETAMINOPHEN 325 MG PO TABS: 650.0000 mg | ORAL_TABLET | Freq: Four times a day (QID) | ORAL | Status: DC | PRN

## 2019-06-21 MED ORDER — TRAMADOL HCL 50 MG PO TABS
50.0000 mg | ORAL_TABLET | Freq: Four times a day (QID) | ORAL | Status: DC | PRN
  Administered 2019-06-21 – 2019-06-22 (×2): 50 mg via ORAL
  Filled 2019-06-21 (×2): qty 1

## 2019-06-21 MED ORDER — OXYCODONE HCL 5 MG/5ML PO SOLN: 5.0000 mg | Freq: Once | ORAL | Status: DC | PRN

## 2019-06-21 MED ORDER — ONDANSETRON HCL 4 MG/2ML IJ SOLN: 4.0000 mg | Freq: Once | INTRAMUSCULAR | Status: DC | PRN

## 2019-06-21 MED ORDER — CHLORHEXIDINE GLUCONATE 0.12 % MT SOLN: 15.0000 mL | Freq: Once | OROMUCOSAL | Status: AC

## 2019-06-21 MED ORDER — PROPOFOL 10 MG/ML IV BOLUS
INTRAVENOUS | Status: AC
  Filled 2019-06-21: qty 40

## 2019-06-21 MED ORDER — ROCURONIUM BROMIDE 10 MG/ML (PF) SYRINGE
PREFILLED_SYRINGE | INTRAVENOUS | Status: DC | PRN
  Administered 2019-06-21: 60 mg via INTRAVENOUS

## 2019-06-21 MED ORDER — GLYCOPYRROLATE PF 0.2 MG/ML IJ SOSY
PREFILLED_SYRINGE | INTRAMUSCULAR | Status: DC | PRN
  Administered 2019-06-21: .2 mg via INTRAVENOUS

## 2019-06-21 MED ORDER — ATENOLOL 50 MG PO TABS
75.0000 mg | ORAL_TABLET | Freq: Every day | ORAL | Status: DC
  Administered 2019-06-21 – 2019-06-22 (×2): 75 mg via ORAL
  Filled 2019-06-21 (×2): qty 1

## 2019-06-21 MED ORDER — ONDANSETRON HCL 4 MG/2ML IJ SOLN
INTRAMUSCULAR | Status: AC
  Filled 2019-06-21: qty 4

## 2019-06-21 SURGICAL SUPPLY — 38 items
APPLIER CLIP ROT 10 11.4 M/L (STAPLE) ×3
CABLE HIGH FREQUENCY MONO STRZ (ELECTRODE) ×3 IMPLANT
CHLORAPREP W/TINT 26 (MISCELLANEOUS) ×6 IMPLANT
CLIP APPLIE ROT 10 11.4 M/L (STAPLE) ×1 IMPLANT
CLOSURE WOUND 1/2 X4 (GAUZE/BANDAGES/DRESSINGS)
COVER MAYO STAND STRL (DRAPES) ×3 IMPLANT
COVER SURGICAL LIGHT HANDLE (MISCELLANEOUS) ×3 IMPLANT
DECANTER SPIKE VIAL GLASS SM (MISCELLANEOUS) ×3 IMPLANT
DERMABOND ADVANCED (GAUZE/BANDAGES/DRESSINGS) ×4
DERMABOND ADVANCED .7 DNX12 (GAUZE/BANDAGES/DRESSINGS) ×2 IMPLANT
DRAIN CHANNEL 19F RND (DRAIN) ×3 IMPLANT
DRAPE C-ARM 42X120 X-RAY (DRAPES) ×3 IMPLANT
ELECT REM PT RETURN 15FT ADLT (MISCELLANEOUS) ×3 IMPLANT
EVACUATOR SILICONE 100CC (DRAIN) ×3 IMPLANT
GAUZE SPONGE 2X2 8PLY STRL LF (GAUZE/BANDAGES/DRESSINGS) ×1 IMPLANT
GLOVE SURG ORTHO 8.0 STRL STRW (GLOVE) ×3 IMPLANT
GOWN STRL REUS W/TWL XL LVL3 (GOWN DISPOSABLE) ×6 IMPLANT
KIT BASIN (CUSTOM PROCEDURE TRAY) ×3 IMPLANT
KIT TURNOVER KIT A (KITS) ×3 IMPLANT
PENCIL HANDSWITCHING (ELECTRODE) ×3 IMPLANT
POUCH SPECIMEN RETRIEVAL 10MM (ENDOMECHANICALS) ×3 IMPLANT
SCISSORS LAP 5X35 DISP (ENDOMECHANICALS) ×3 IMPLANT
SET CHOLANGIOGRAPH MIX (MISCELLANEOUS) ×3 IMPLANT
SET IRRIG TUBING LAPAROSCOPIC (IRRIGATION / IRRIGATOR) ×3 IMPLANT
SET TUBE SMOKE EVAC HIGH FLOW (TUBING) ×3 IMPLANT
SLEEVE XCEL OPT CAN 5 100 (ENDOMECHANICALS) ×3 IMPLANT
SPONGE DRAIN TRACH 4X4 STRL 2S (GAUZE/BANDAGES/DRESSINGS) ×3 IMPLANT
SPONGE GAUZE 2X2 STER 10/PKG (GAUZE/BANDAGES/DRESSINGS) ×2
STRIP CLOSURE SKIN 1/2X4 (GAUZE/BANDAGES/DRESSINGS) IMPLANT
SUT ETHILON 2 0 PS N (SUTURE) ×3 IMPLANT
SUT MNCRL AB 4-0 PS2 18 (SUTURE) ×3 IMPLANT
SUT VICRYL 0 UR6 27IN ABS (SUTURE) ×3 IMPLANT
TOWEL OR 17X26 10 PK STRL BLUE (TOWEL DISPOSABLE) ×3 IMPLANT
TOWEL OR NON WOVEN STRL DISP B (DISPOSABLE) ×3 IMPLANT
TRAY LAPAROSCOPIC (CUSTOM PROCEDURE TRAY) ×3 IMPLANT
TROCAR BLADELESS OPT 5 100 (ENDOMECHANICALS) ×3 IMPLANT
TROCAR XCEL BLUNT TIP 100MML (ENDOMECHANICALS) ×3 IMPLANT
TROCAR XCEL NON-BLD 11X100MML (ENDOMECHANICALS) ×3 IMPLANT

## 2019-06-21 NOTE — Interval H&P Note (Signed)
History and Physical Interval Note:  06/21/2019 7:01 AM  Joseph Bryan  has presented today for surgery, with the diagnosis of CHRONIC CHOLECYSTITIS CHOLELITHIASIS.  The various methods of treatment have been discussed with the patient and family. After consideration of risks, benefits and other options for treatment, the patient has consented to    Procedure(s): LAPAROSCOPIC CHOLECYSTECTOMY WITH INTRAOPERATIVE CHOLANGIOGRAM (N/A) as a surgical intervention.    The patient's history has been reviewed, patient examined, no change in status, stable for surgery.  I have reviewed the patient's chart and labs.  Questions were answered to the patient's satisfaction.    Armandina Gemma, MD [facility] Office: [place]

## 2019-06-21 NOTE — Anesthesia Postprocedure Evaluation (Signed)
Anesthesia Post Note  Patient: Marty Uy Paster  Procedure(s) Performed: LAPAROSCOPIC CHOLECYSTECTOMY WITH INTRAOPERATIVE CHOLANGIOGRAM (N/A )     Patient location during evaluation: PACU Anesthesia Type: General Level of consciousness: awake and alert Pain management: pain level controlled Vital Signs Assessment: post-procedure vital signs reviewed and stable Respiratory status: spontaneous breathing, nonlabored ventilation, respiratory function stable and patient connected to nasal cannula oxygen Cardiovascular status: blood pressure returned to baseline and stable Postop Assessment: no apparent nausea or vomiting Anesthetic complications: no    Last Vitals:  Vitals:   06/21/19 1045 06/21/19 1100  BP: 136/69 (!) 151/71  Pulse: 76 71  Resp: 12 15  Temp:  36.4 C  SpO2: 98% 100%    Last Pain:  Vitals:   06/21/19 1144  TempSrc:   PainSc: 6                  Treyten Monestime

## 2019-06-21 NOTE — Progress Notes (Signed)
Cayden H Hsu 1:32 PM  Subjective: Patient seen and examined and discussed with Dr. Harlow Asa and our PA and his hospital computer chart was reviewed and his case discussed with his daughter and his wife as well and he is doing well postop and has not had much gallbladder problems  Objective: Vital signs stable afebrile no acute distress exam pertinent for his abdomen being a little sore over the surgical sites [place] reviewed ultrasound reviewed labs reviewed  Assessment: Positive IOC  Plan: Risk benefits methods and success rate was discussed with the patient and his family and will proceed tomorrow morning with further work-up and plans pending those findings  [facility]  office [phone] After 5PM or if no answer call [phone]

## 2019-06-21 NOTE — Consult Note (Signed)
Referring Provider: Dr. Armandina Gemma Primary Care Physician:  Leonard Downing, MD Primary Gastroenterologist:  [facility] (Dr. Earle Gell)  Reason for Consultation:  Abnormal IOC  HPI: Joseph Bryan is a 79 y.o. 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  Patient underwent laparoscopic cholecystectomy today 6/8 for cholelithiasis and chronic cholecystitis.  IOC showed two persistent nonocclusive filling defects within the distal aspect of the CBD worrisome for nonocclusive choledocholithiasis.  Patient seen and examined.  He reports some mild RUQ pain as well as some incisional site tenderness.  Denies any nausea, vomiting, hematemesis.  Denies any recent changes in appetite or unexplained weight loss.  Denies trouble swallowing or heartburn.  Further denies any changes in bowel habits, melena, or hematochezia.  Denies any fever or chills.  He takes 81 mg aspirin daily but denies any blood thinner use.  Denies any family history of colon cancer or gastrointestinal malignancies.  Last colonoscopy 03/2015 showed a 22mm tubular adenoma in the transverse colon but was otherwise normal; no repeat recommended due to age.  Past Medical History:  Diagnosis Date  . 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 Coronary artery disease   . Glaucoma   . Heart disease   . History of kidney stones   . Hyperlipidemia   . Hypertension   . Myocardial infarction ([place]) 17 yrs ago  . Neuromuscular disorder (HCC)    neuropathy feet  . Neuropathy    feet  . Pneumonia   . Sleep apnea with use of continuous positive airway pressure (CPAP)    uses CPAP    Past Surgical History:  Procedure Laterality Date  .  APPENDECTOMY  2014  . COLON RESECTION N/A 08/27/2012   Procedure: Diagnostic laparoscopy; Ileocecectomy;  Surgeon: Madilyn Hook, DO;  Location: [facility];  Service: General;  Laterality: N/A;  . COLONOSCOPY WITH PROPOFOL N/A 04/09/2015   Procedure: COLONOSCOPY WITH PROPOFOL;  Surgeon: Garlan Fair, MD;  Location: [facility];  Service: Endoscopy;  Laterality: N/A;  . CORONARY ARTERY BYPASS GRAFT  1998   x5  . CYSTOSCOPY/URETEROSCOPY/HOLMIUM LASER/STENT PLACEMENT Right 02/25/2018   Procedure: RIGHT URETEROSCOPY/HOLMIUM LASER/ STONE REMOVAL  STENT PLACEMENT;  Surgeon: Ardis Hughs, MD;  Location: [facility];  Service: Urology;  Laterality: Right;  . EYE SURGERY     cataract and glaucome  . LUMBAR LAMINECTOMY/DECOMPRESSION MICRODISCECTOMY N/A 10/19/2015   Procedure: Lumbar three to four LAMINECTOMY/FORAMINOTOMY;  Surgeon: Erline Levine, MD;  Location: [facility];  Service: Neurosurgery;  Laterality: N/A;  . 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 Authorizing Provider  Ascorbic Acid (VITAMIN C) 1000 MG tablet Take 1,000 mg by mouth daily.   Yes [provider]  aspirin EC 81 MG tablet Take 1 tablet (81 mg total) by mouth daily. 12/21/18  Yes Troy Sine, MD  atenolol (TENORMIN) 50 MG tablet Take 1.5 tablets (75 mg total) by mouth daily. Patient taking differently: Take 75 mg by mouth at bedtime.  06/01/19  Yes Troy Sine, MD  BEE POLLEN PO Take 3 capsules by mouth daily.  Yes [provider]  Cyanocobalamin (VITAMIN B-12 PO) Take 2,000 mcg by mouth daily.    Yes [provider]  gabapentin (NEURONTIN) 300 MG capsule TAKE 1 CAPSULE BY MOUTH TWO TIMES DAILY Patient taking differently: Take 300 mg by mouth daily.  09/30/18  Yes Troy Sine, MD  metolazone (ZAROXOLYN) 5 MG tablet Take 2.5 mg by mouth 2 (two) times a week. 01/03/19  Yes [provider]  potassium chloride SA (KLOR-CON) 20 MEQ tablet TAKE 1 TABLET BY MOUTH DAILY DAILY WHEN YOU TAKE A METOLAZONE Patient taking differently: Take 20 mEq by mouth 2 (two) times a week.  12/23/18  Yes Troy Sine, MD  pyridOXINE (VITAMIN B-6) 100 MG tablet Take 100 mg by mouth daily.   Yes [provider]  rosuvastatin (CRESTOR) 20 MG tablet TAKE 1 TABLET BY MOUTH DAILY Patient taking differently: Take 20 mg by mouth at bedtime.  04/14/19  Yes Troy Sine, MD  torsemide (DEMADEX) 20 MG tablet Take 1 tablet (20 mg total) by mouth daily. Patient taking differently: Take 20 mg by mouth 2 (two) times daily.  06/01/19  Yes Troy Sine, MD  metolazone (ZAROXOLYN) 2.5 MG tablet TAKE EVERY OTHER DAY (20-30 MINUTES PRIOR TOMORNING DOSE OF TORSEMIDE) 12/21/18   Troy Sine, MD  traMADol (ULTRAM) 50 MG tablet Take 1-2 tablets (50-100 mg total) by mouth every 6 (six) hours as needed for moderate pain. 02/25/18   Ardis Hughs, MD    Scheduled Meds: . atenolol  75 mg Oral QHS  . fentaNYL      . fentaNYL      . gabapentin  300 mg Oral Daily  . torsemide  20 mg Oral BID   Continuous Infusions: . 0.45 % NaCl with KCl 20 mEq / L 50 mL/hr at 06/21/19 1234  . cefTRIAXone (ROCEPHIN)  IV 2 g (06/21/19 1238)   PRN Meds:.acetaminophen **OR** acetaminophen, HYDROcodone-acetaminophen, HYDROmorphone (DILAUDID) injection, ondansetron **OR** ondansetron (ZOFRAN) IV, traMADol  Allergies as of 06/09/2019 - Review Complete 06/09/2019  Allergen Reaction Noted  . Other Rash and Other (See Comments) 01/16/2017    Family History  Problem Relation Age of Onset  . Hypertension Father   . Hypertension Sister   . Heart attack Brother     Social History   Socioeconomic History  . Marital status: Married    Spouse name: Not on file  . Number of children: Not on file  . Years of education: Not on file  . Highest education level: Not on file  Occupational History  . Not on file   Tobacco Use  . Smoking status: Former Smoker    Types: Cigarettes    Quit date: 08/25/1988    Years since quitting: 30.8  . Smokeless tobacco: Never Used  Substance and Sexual Activity  . Alcohol use: No    Alcohol/week: 0.0 standard drinks  . Drug use: No  . Sexual activity: Not Currently  Other Topics Concern  . Not on file  Social History Narrative  . Not on file   Social Determinants of Health   Financial Resource Strain:   . Difficulty of Paying Living Expenses:   Food Insecurity:   . Worried About Charity fundraiser in the Last Year:   . Arboriculturist in the Last Year:   Transportation Needs:   . Film/video editor (Medical):   Marland Kitchen Lack of Transportation (Non-Medical):   Physical Activity:   . Days of Exercise  per Week:   . Minutes of Exercise per Session:   Stress:   . Feeling of Stress :   Social Connections:   . Frequency of Communication with Friends and Family:   . Frequency of Social Gatherings with Friends and Family:   . Attends Religious Services:   . Active Member of Clubs or Organizations:   . Attends Archivist Meetings:   Marland Kitchen Marital Status:   Intimate Partner Violence:   . Fear of Current or Ex-Partner:   . Emotionally Abused:   Marland Kitchen Physically Abused:   . Sexually Abused:     Review of Systems: Review of Systems  Constitutional: Negative for chills, fever and weight loss.  HENT: Negative for sore throat.   Eyes: Negative for pain and redness.  Respiratory: Negative for cough, shortness of breath and stridor.   Cardiovascular: Negative for chest pain and palpitations.  Gastrointestinal: Positive for abdominal pain. Negative for blood in stool, constipation, diarrhea, heartburn, melena, nausea and vomiting.  Genitourinary: Negative for flank pain and hematuria.  Musculoskeletal: Negative for joint pain and neck pain.  Skin: Negative for itching and rash.  Neurological: Negative for seizures and loss of consciousness.   Endo/Heme/Allergies: Negative for polydipsia. Does not bruise/bleed easily.  Psychiatric/Behavioral: Negative for substance abuse. The patient is not nervous/anxious.    Physical Exam: Vital signs: Vitals:   06/21/19 1100 06/21/19 1235  BP: (!) 151/71 135/65  Pulse: 71 (!) 58  Resp: 15 18  Temp: 97.6 F (36.4 C) 98 F (36.7 C)  SpO2: 100% 100%   Last BM Date: 06/20/19  Physical Exam  Constitutional: He is oriented to person, place, and time. He appears well-developed and well-nourished. No distress.  Patient's wife is at bedside  Eyes: Conjunctivae and EOM are normal. No scleral icterus.  Cardiovascular: Regular rhythm and normal heart sounds. Bradycardia present.  Pulmonary/Chest: Effort normal and breath sounds normal. No respiratory distress.  Abdominal: Soft. Bowel sounds are normal. He exhibits no distension and no mass. There is abdominal tenderness (mild RUQ). There is no rebound and no guarding.  Musculoskeletal:        General: Edema (bilateral pedal edema) present. No deformity.     Cervical back: Normal range of motion and neck supple.     Comments: SCDs in place  Neurological: He is alert and oriented to person, place, and time.  Skin: Skin is warm and dry.  Three laparoscopic incision sites present on abdomen, approximated with surgical glue  Psychiatric: He has a normal mood and affect. His behavior is normal.    GI:  Lab Results: No results for input(s): WBC, HGB, HCT, PLT in the last 72 hours. BMET No results for input(s): NA, K, CL, CO2, GLUCOSE, BUN, CREATININE, CALCIUM in the last 72 hours. LFT No results for input(s): PROT, ALBUMIN, AST, ALT, ALKPHOS, BILITOT, BILIDIR, IBILI in the last 72 hours. PT/INR No results for input(s): LABPROT, INR in the last 72 hours.   Studies/Results: DG Cholangiogram Operative  Result Date: 06/21/2019 CLINICAL DATA:  Intraoperative cholangiogram during laparoscopic cholecystectomy. EXAM: INTRAOPERATIVE CHOLANGIOGRAM  FLUOROSCOPY TIME:  13 seconds COMPARISON:  Right upper quadrant abdominal ultrasound-05/13/2019 FINDINGS: Intraoperative cholangiographic images of the right upper abdominal quadrant during laparoscopic cholecystectomy are provided for review. Surgical clips overlie the expected location of the gallbladder fossa. Contrast injection demonstrates selective cannulation of the central aspect of the cystic duct. There is passage of contrast through the central aspect of the cystic duct with filling of a non dilated  common bile duct. There is passage of contrast though the CBD and into the descending portion of the duodenum. There is minimal reflux of injected contrast into the common hepatic duct and central aspect of the non dilated intrahepatic biliary system. There are two persistent nonocclusive filling defects within the distal aspect of the CBD worrisome for choledocholithiasis. IMPRESSION: Two persistent nonocclusive filling defects within the distal aspect of the CBD worrisome for nonocclusive choledocholithiasis. Further evaluation and management with ERCP could be performed as indicated. Electronically Signed   By: Sandi Mariscal M.D.   On: 06/21/2019 09:27    Impression: Suspected choledocholithiasis -S/p cholecystectomy 6/8, abnormal IOC  -Recent CMP unavailable in EMR, though per chart review, it was noted that patient has had chronic elevations in LFTs, especially ALP.  CMP pending for tomorrow morning.  Plan: ERCP tomorrow with Dr. Watt Climes.  I thoroughly reviewed the procedure to include nature, benefits, and risks (including but not limited to pancreatitis, bleeding, infection, perforation, anesthesia/cardiac and pulmonary complications) with the patient and his wife.  Patient verbalized understanding and gave verbal consent to proceed with the ERCP tomorrow with Dr. Watt Climes.  CMP and CBC to be drawn tomorrow morning.  [facility] will follow.   LOS: 0 days   Salley Slaughter  PA-C 06/21/2019,  1:14 PM  Contact #  [phone]

## 2019-06-21 NOTE — Anesthesia Procedure Notes (Signed)
Procedure Name: Intubation Date/Time: 06/21/2019 7:55 AM Performed by: Lavina Hamman, CRNA Pre-anesthesia Checklist: Patient identified, Emergency Drugs available, Suction available, Patient being monitored and Timeout performed Patient Re-evaluated:Patient Re-evaluated prior to induction Oxygen Delivery Method: Circle system utilized Preoxygenation: Pre-oxygenation with 100% oxygen Induction Type: IV induction Ventilation: Mask ventilation without difficulty Laryngoscope Size: Mac and 4 Grade View: Grade I Tube type: Oral Tube size: 7.5 mm Number of attempts: 1 Airway Equipment and Method: Stylet Placement Confirmation: ETT inserted through vocal cords under direct vision,  positive ETCO2,  CO2 detector and breath sounds checked- equal and bilateral Secured at: 23 cm Tube secured with: Tape Dental Injury: Teeth and Oropharynx as per pre-operative assessment

## 2019-06-21 NOTE — Op Note (Signed)
Procedure Note  Pre-operative Diagnosis:  Chronic cholecystitis, cholelithiasis  Post-operative Diagnosis:  Chronic cholecystitis, cholelithiasis, choledocholithiasis  Surgeon:  Armandina Gemma, MD  Assistant:  none   Procedure:  Laparoscopic cholecystectomy with intra-operative cholangiography  Anesthesia:  General  Estimated Blood Loss:  minimal  Drains: 19Fr Blake drain in subhepatic space         Specimen: gallbladder to pathology  Indications:  Patient returns for follow-up. He was seen for evaluation of right upper quadrant abdominal pain. Patient had sustained a fall and also had rib fractures on the right side. Repeat laboratory studies showed persistent elevation of alkaline phosphatase and one of his transaminases. His total bilirubin was normal. Patient underwent ultrasound examination on May 13, 2019. This showed multiple gallstones completely filling the gallbladder with the largest stone measuring 1.3 cm. There was no biliary dilatation. There were no focal lesions within the liver. There was mild hepatic steatosis. Patient returns today to review this reported detail. Patient denies any nausea or vomiting. He is still having intermittent right upper quadrant abdominal pain radiating to the back. The discomfort associated with his rib fractures has improved.  Procedure Details:  The patient was seen in the pre-op holding area. The risks, benefits, complications, treatment options, and expected outcomes were previously discussed with the patient. The patient agreed with the proposed plan and has signed the informed consent form.  The patient was transported to operating room #4 at the [facility]. The patient was placed in the supine position on the operating room table. Following induction of general anesthesia, the abdomen was prepped and draped in the usual aseptic fashion.  An incision was made in the skin near the umbilicus. The midline fascia was  incised and the peritoneal cavity was entered and a Hasson cannula was introduced under direct vision. The cannula was secured with a 0-Vicryl pursestring suture. Pneumoperitoneum was established with carbon dioxide. Additional cannulae were introduced under direct vision along the right costal margin in the midline, mid-clavicular line, and anterior axillary line.   The gallbladder was identified. It was completely filled with stone material.  It was aspirated with the trocar and the fundus grasped and retracted cephalad. Adhesions were taken down bluntly and the electrocautery was utilized as needed, taking care not to involve any adjacent structures. The infundibulum was grasped and retracted laterally, exposing the peritoneum overlying the triangle of Calot. The peritoneum was incised and structures exposed with blunt dissection. The cystic duct was clearly identified, bluntly dissected circumferentially, and clipped at the neck of the gallbladder.  An incision was made in the cystic duct and the cholangiogram catheter introduced. The catheter was secured using an ligaclip.  Real-time cholangiography was performed using C-arm fluoroscopy.  There was filling of a mildly dilated common bile duct.  There was reflux of contrast into the left and right hepatic ductal systems.  There was flow distally into the duodenum with filling defects present in the distal CBD.  The catheter was removed from the peritoneal cavity.  The cystic duct was then ligated with ligaclips and divided. The cystic artery was identified, dissected circumferentially, ligated with ligaclips, and divided.  The gallbladder was dissected away from the gallbladder bed using the electrocautery for hemostasis. The gallbladder was completely removed from the liver and placed into an endocatch bag. Additional stone material from inside the gallbladder was retrieved and placed into the bag.  The gallbladder was removed in the endocatch bag  through the umbilical port site and submitted to  pathology for review.  The right upper quadrant was irrigated and the gallbladder bed was inspected. Hemostasis was achieved with the electrocautery.  A 19Fr Blake drain was placed in the subhepatic space and exited through the lateral port site.  It was secured to the skin with a 2-0 Nylon suture.  Cannulae were removed under direct vision and good hemostasis was noted. Pneumoperitoneum was released and the majority of the carbon dioxide evacuated. The umbilical wound was irrigated and the fascia was then closed with the pursestring suture.  Local anesthetic was infiltrated at all port sites. Skin incisions were closed with 4-0 Monocril subcuticular sutures and Dermabond was applied.  Instrument, sponge, and needle counts were correct at the conclusion of the case.  The patient was awakened from anesthesia and brought to the recovery room in stable condition.  The patient tolerated the procedure well.   Armandina Gemma, MD [facility] Office: [phone]

## 2019-06-21 NOTE — Transfer of Care (Signed)
Immediate Anesthesia Transfer of Care Note  Patient: Joseph Bryan  Procedure(s) Performed: Procedure(s): LAPAROSCOPIC CHOLECYSTECTOMY WITH INTRAOPERATIVE CHOLANGIOGRAM (N/A)  Patient Location: PACU  Anesthesia Type:General  Level of Consciousness:  sedated, patient cooperative and responds to stimulation  Airway & Oxygen Therapy:Patient Spontanous Breathing and Patient connected to face mask oxgen  Post-op Assessment:  Report given to PACU RN and Post -op Vital signs reviewed and stable  Post vital signs:  Reviewed and stable  Last Vitals:  Vitals:   06/21/19 0633 06/21/19 1000  BP: 124/74 (!) 156/73  Pulse: 79 82  Resp: 18 18  Temp: 37.1 C 36.4 C  SpO2: 44% 695%    Complications: No apparent anesthesia complications

## 2019-06-21 NOTE — Telephone Encounter (Signed)
Attempted to return call to [facility] at [facility], but the office is currently closed. It appears that the patient was seen by Coletta Memos, NP yesterday and was cleared for his surgery and his note was forwarded to Dr. Harlow Asa. Patient is currently admitted for his surgery today at [facility].

## 2019-06-22 ENCOUNTER — Observation Stay (HOSPITAL_COMMUNITY): Payer: Medicare Other | Admitting: Anesthesiology

## 2019-06-22 ENCOUNTER — Observation Stay (HOSPITAL_COMMUNITY): Payer: Medicare Other

## 2019-06-22 ENCOUNTER — Encounter (HOSPITAL_COMMUNITY)
Admission: RE | Disposition: A | Discharge status: Home / Self Care | Payer: Self-pay | Source: Home / Self Care | Attending: Surgery

## 2019-06-22 DIAGNOSIS — G629 Polyneuropathy, unspecified: Secondary | ICD-10-CM | POA: Diagnosis present

## 2019-06-22 DIAGNOSIS — Z7982 Long term (current) use of aspirin: Secondary | ICD-10-CM | POA: Diagnosis not present

## 2019-06-22 DIAGNOSIS — Z8249 Family history of ischemic heart disease and other diseases of the circulatory system: Secondary | ICD-10-CM | POA: Diagnosis not present

## 2019-06-22 DIAGNOSIS — I10 Essential (primary) hypertension: Secondary | ICD-10-CM | POA: Diagnosis present

## 2019-06-22 DIAGNOSIS — G4730 Sleep apnea, unspecified: Secondary | ICD-10-CM | POA: Diagnosis not present

## 2019-06-22 DIAGNOSIS — W19XXXA Unspecified fall, initial encounter: Secondary | ICD-10-CM | POA: Diagnosis present

## 2019-06-22 DIAGNOSIS — Z87891 Personal history of nicotine dependence: Secondary | ICD-10-CM | POA: Diagnosis not present

## 2019-06-22 DIAGNOSIS — K760 Fatty (change of) liver, not elsewhere classified: Secondary | ICD-10-CM | POA: Diagnosis not present

## 2019-06-22 DIAGNOSIS — H409 Unspecified glaucoma: Secondary | ICD-10-CM | POA: Diagnosis present

## 2019-06-22 DIAGNOSIS — Z86010 Personal history of colonic polyps: Secondary | ICD-10-CM | POA: Diagnosis not present

## 2019-06-22 DIAGNOSIS — E785 Hyperlipidemia, unspecified: Secondary | ICD-10-CM | POA: Diagnosis not present

## 2019-06-22 DIAGNOSIS — S2231XA Fracture of one rib, right side, initial encounter for closed fracture: Secondary | ICD-10-CM | POA: Diagnosis not present

## 2019-06-22 DIAGNOSIS — Z20822 Contact with and (suspected) exposure to covid-19: Secondary | ICD-10-CM | POA: Diagnosis not present

## 2019-06-22 DIAGNOSIS — I2699 Other pulmonary embolism without acute cor pulmonale: Secondary | ICD-10-CM | POA: Diagnosis not present

## 2019-06-22 DIAGNOSIS — I2510 Atherosclerotic heart disease of native coronary artery without angina pectoris: Secondary | ICD-10-CM | POA: Diagnosis not present

## 2019-06-22 DIAGNOSIS — Z79899 Other long term (current) drug therapy: Secondary | ICD-10-CM | POA: Diagnosis not present

## 2019-06-22 DIAGNOSIS — Y92009 Unspecified place in unspecified non-institutional (private) residence as the place of occurrence of the external cause: Secondary | ICD-10-CM | POA: Diagnosis not present

## 2019-06-22 DIAGNOSIS — K259 Gastric ulcer, unspecified as acute or chronic, without hemorrhage or perforation: Secondary | ICD-10-CM | POA: Diagnosis not present

## 2019-06-22 DIAGNOSIS — I252 Old myocardial infarction: Secondary | ICD-10-CM | POA: Diagnosis not present

## 2019-06-22 DIAGNOSIS — Z6836 Body mass index (BMI) 36.0-36.9, adult: Secondary | ICD-10-CM | POA: Diagnosis not present

## 2019-06-22 DIAGNOSIS — K8064 Calculus of gallbladder and bile duct with chronic cholecystitis without obstruction: Secondary | ICD-10-CM | POA: Diagnosis not present

## 2019-06-22 DIAGNOSIS — N200 Calculus of kidney: Secondary | ICD-10-CM | POA: Diagnosis not present

## 2019-06-22 DIAGNOSIS — Z951 Presence of aortocoronary bypass graft: Secondary | ICD-10-CM | POA: Diagnosis not present

## 2019-06-22 HISTORY — PX: SPHINCTEROTOMY: SHX5544

## 2019-06-22 HISTORY — PX: REMOVAL OF STONES: SHX5545

## 2019-06-22 HISTORY — PX: ERCP: SHX5425

## 2019-06-22 LAB — COMPREHENSIVE METABOLIC PANEL
ALT: 24 U/L (ref 0–44)
AST: 48 U/L — ABNORMAL HIGH (ref 15–41)
Albumin: 2.8 g/dL — ABNORMAL LOW (ref 3.5–5.0)
Alkaline Phosphatase: 143 U/L — ABNORMAL HIGH (ref 38–126)
Anion gap: 8 (ref 5–15)
BUN: 16 mg/dL (ref 8–23)
CO2: 33 mmol/L — ABNORMAL HIGH (ref 22–32)
Calcium: 8.2 mg/dL — ABNORMAL LOW (ref 8.9–10.3)
Chloride: 93 mmol/L — ABNORMAL LOW (ref 98–111)
Creatinine, Ser: 0.97 mg/dL (ref 0.61–1.24)
GFR calc Af Amer: >60 mL/min (ref >60)
GFR calc non Af Amer: >60 mL/min (ref >60)
Glucose, Bld: 118 mg/dL — ABNORMAL HIGH (ref 70–99)
Potassium: 3.4 mmol/L — ABNORMAL LOW (ref 3.5–5.1)
Sodium: 134 mmol/L — ABNORMAL LOW (ref 135–145)
Total Bilirubin: 1 mg/dL (ref 0.3–1.2)
Total Protein: 6.9 g/dL (ref 6.5–8.1)

## 2019-06-22 LAB — CBC
HCT: 36.4 % — ABNORMAL LOW (ref 39.0–52.0)
Hemoglobin: 11.3 g/dL — ABNORMAL LOW (ref 13.0–17.0)
MCH: 29.4 pg (ref 26.0–34.0)
MCHC: 31 g/dL (ref 30.0–36.0)
MCV: 94.5 fL (ref 80.0–100.0)
Platelets: 161 10*3/uL (ref 150–400)
RBC: 3.85 MIL/uL — ABNORMAL LOW (ref 4.22–5.81)
RDW: 15.3 % (ref 11.5–15.5)
WBC: 12.2 10*3/uL — ABNORMAL HIGH (ref 4.0–10.5)
nRBC: 0 % (ref 0.0–0.2)

## 2019-06-22 LAB — SURGICAL PATHOLOGY

## 2019-06-22 SURGERY — ERCP, WITH INTERVENTION IF INDICATED
Anesthesia: General

## 2019-06-22 MED ORDER — ROCURONIUM BROMIDE 100 MG/10ML IV SOLN
INTRAVENOUS | Status: DC | PRN
  Administered 2019-06-22: 50 mg via INTRAVENOUS

## 2019-06-22 MED ORDER — LACTATED RINGERS IV SOLN
INTRAVENOUS | Status: DC
  Administered 2019-06-22: 1000 mL via INTRAVENOUS

## 2019-06-22 MED ORDER — SUGAMMADEX SODIUM 500 MG/5ML IV SOLN
INTRAVENOUS | Status: DC | PRN
  Administered 2019-06-22: 400 mg via INTRAVENOUS

## 2019-06-22 MED ORDER — SODIUM CHLORIDE 0.9 % IV SOLN: INTRAVENOUS | Status: DC

## 2019-06-22 MED ORDER — EPHEDRINE SULFATE 50 MG/ML IJ SOLN
INTRAMUSCULAR | Status: DC | PRN
Start: 2019-06-22 — End: 2019-06-22
  Administered 2019-06-22: 10 mg via INTRAVENOUS

## 2019-06-22 MED ORDER — GLUCAGON HCL RDNA (DIAGNOSTIC) 1 MG IJ SOLR
INTRAMUSCULAR | Status: AC
  Filled 2019-06-22: qty 1

## 2019-06-22 MED ORDER — ONDANSETRON HCL 4 MG/2ML IJ SOLN
INTRAMUSCULAR | Status: DC | PRN
  Administered 2019-06-22: 4 mg via INTRAVENOUS

## 2019-06-22 MED ORDER — FENTANYL CITRATE (PF) 100 MCG/2ML IJ SOLN
50.0000 ug | Freq: Once | INTRAMUSCULAR | Status: AC
  Administered 2019-06-22: 50 ug via INTRAVENOUS

## 2019-06-22 MED ORDER — FENTANYL CITRATE (PF) 100 MCG/2ML IJ SOLN
INTRAMUSCULAR | Status: DC | PRN
  Administered 2019-06-22: 50 ug via INTRAVENOUS

## 2019-06-22 MED ORDER — FENTANYL CITRATE (PF) 100 MCG/2ML IJ SOLN
INTRAMUSCULAR | Status: AC
  Filled 2019-06-22: qty 2

## 2019-06-22 MED ORDER — PROPOFOL 10 MG/ML IV BOLUS
INTRAVENOUS | Status: DC | PRN
  Administered 2019-06-22: 120 mg via INTRAVENOUS

## 2019-06-22 MED ORDER — PROPOFOL 10 MG/ML IV BOLUS
INTRAVENOUS | Status: AC
  Filled 2019-06-22: qty 20

## 2019-06-22 MED ORDER — PHENYLEPHRINE HCL (PRESSORS) 10 MG/ML IV SOLN
INTRAVENOUS | Status: DC | PRN
  Administered 2019-06-22: 120 ug via INTRAVENOUS

## 2019-06-22 MED ORDER — SODIUM CHLORIDE 0.9 % IV SOLN
INTRAVENOUS | Status: DC | PRN
  Administered 2019-06-22: 25 mL

## 2019-06-22 MED ORDER — INDOMETHACIN 50 MG RE SUPP
RECTAL | Status: AC
  Filled 2019-06-22: qty 2

## 2019-06-22 MED ORDER — LIDOCAINE HCL (CARDIAC) PF 100 MG/5ML IV SOSY
PREFILLED_SYRINGE | INTRAVENOUS | Status: DC | PRN
  Administered 2019-06-22: 80 mg via INTRAVENOUS

## 2019-06-22 MED ORDER — DEXAMETHASONE SODIUM PHOSPHATE 10 MG/ML IJ SOLN
INTRAMUSCULAR | Status: DC | PRN
Start: 2019-06-22 — End: 2019-06-22
  Administered 2019-06-22: 4 mg via INTRAVENOUS

## 2019-06-22 NOTE — Progress Notes (Signed)
Assessment & Plan: POD#1 - status post lap chole with IOC, drain placement  Doing well post op, mild pain  ERCP this AM per Dr. Watt Climes  Anticipate starting diet this afternoon, likely home tomorrow  Monitor drain output - plan to remove prior to discharge tomorrow        Armandina Gemma, MD       [facility]       Office: [phone]   Chief Complaint: Chronic cholecystitis, cholelithiasis, choledocholithiasis  Subjective: Patient in bed, comfortable.  No complaints.  Awaiting ERCP this AM.  Objective: Vital signs in last 24 hours: Temp:  [97.3 F (36.3 C)-98.7 F (37.1 C)] 98.1 F (36.7 C) (06/09 0644) Pulse Rate:  [58-83] 71 (06/09 0644) Resp:  [12-18] 18 (06/09 0159) BP: (111-156)/(54-112) 111/55 (06/09 0644) SpO2:  [90 %-100 %] 90 % (06/09 0644) Last BM Date: 06/20/19  Intake/Output from previous day: 06/08 0701 - 06/09 0700 In: 1745 [I.V.:1645; IV Piggyback:100] Out: 2815 [Urine:2350; Drains:315; Blood:150] Intake/Output this shift: No intake/output data recorded.  Physical Exam: HEENT - sclerae clear, mucous membranes moist Neck - soft Chest - clear bilaterally Cor - RRR Abdomen - soft, protuberant; wounds dry and intact; JP drain with serosanguinous Neuro - alert & oriented, no focal deficits  Lab Results:  Recent Labs    06/22/19 0508  WBC 12.2*  HGB 11.3*  HCT 36.4*  PLT 161   BMET Recent Labs    06/22/19 0508  NA 134*  K 3.4*  CL 93*  CO2 33*  GLUCOSE 118*  BUN 16  CREATININE 0.97  CALCIUM 8.2*   PT/INR No results for input(s): LABPROT, INR in the last 72 hours. Comprehensive Metabolic Panel:    Component Value Date/Time   NA 134 (L) 06/22/2019 0508   NA 138 06/17/2019 1532   NA 145 (H) 08/17/2017 1348   NA 142 06/03/2017 1530   K 3.4 (L) 06/22/2019 0508   K 4.1 06/17/2019 1532   CL 93 (L) 06/22/2019 0508   CL 96 (L) 06/17/2019 1532   CO2 33 (H) 06/22/2019 0508   CO2 31 06/17/2019 1532   BUN 16  06/22/2019 0508   BUN 13 06/17/2019 1532   BUN 16 08/17/2017 1348   BUN 16 06/03/2017 1530   CREATININE 0.97 06/22/2019 0508   CREATININE 1.07 06/17/2019 1532   CREATININE 1.01 03/05/2016 1056   CREATININE 0.91 03/06/2015 1029   GLUCOSE 118 (H) 06/22/2019 0508   GLUCOSE 101 (H) 06/17/2019 1532   CALCIUM 8.2 (L) 06/22/2019 0508   CALCIUM 8.9 06/17/2019 1532   AST 48 (H) 06/22/2019 0508   AST 52 (H) 08/17/2017 1348   ALT 24 06/22/2019 0508   ALT 39 08/17/2017 1348   ALKPHOS 143 (H) 06/22/2019 0508   ALKPHOS 240 (H) 08/17/2017 1348   BILITOT 1.0 06/22/2019 0508   BILITOT 0.4 08/17/2017 1348   BILITOT 0.5 06/03/2017 1530   PROT 6.9 06/22/2019 0508   PROT 7.3 08/17/2017 1348   PROT 7.6 06/03/2017 1530   ALBUMIN 2.8 (L) 06/22/2019 0508   ALBUMIN 3.7 08/17/2017 1348   ALBUMIN 3.8 06/03/2017 1530    Studies/Results: DG Cholangiogram Operative  Result Date: 06/21/2019 CLINICAL DATA:  Intraoperative cholangiogram during laparoscopic cholecystectomy. EXAM: INTRAOPERATIVE CHOLANGIOGRAM FLUOROSCOPY TIME:  13 seconds COMPARISON:  Right upper quadrant abdominal ultrasound-05/13/2019 FINDINGS: Intraoperative cholangiographic images of the right upper abdominal quadrant during laparoscopic cholecystectomy are provided for review. Surgical clips overlie the expected location of the gallbladder fossa.  Contrast injection demonstrates selective cannulation of the central aspect of the cystic duct. There is passage of contrast through the central aspect of the cystic duct with filling of a non dilated common bile duct. There is passage of contrast though the CBD and into the descending portion of the duodenum. There is minimal reflux of injected contrast into the common hepatic duct and central aspect of the non dilated intrahepatic biliary system. There are two persistent nonocclusive filling defects within the distal aspect of the CBD worrisome for choledocholithiasis. IMPRESSION: Two persistent  nonocclusive filling defects within the distal aspect of the CBD worrisome for nonocclusive choledocholithiasis. Further evaluation and management with ERCP could be performed as indicated. Electronically Signed   By: Sandi Mariscal M.D.   On: 06/21/2019 09:27      Armandina Gemma 06/22/2019  Patient ID: Joseph Bryan, male   DOB: [birth year], 79 y.o.   MRN: [id number]

## 2019-06-22 NOTE — Anesthesia Postprocedure Evaluation (Signed)
Anesthesia Post Note  Patient: Rulon Abdalla Dewald  Procedure(s) Performed: ENDOSCOPIC RETROGRADE CHOLANGIOPANCREATOGRAPHY (ERCP) (N/A ) SPHINCTEROTOMY REMOVAL OF STONES     Patient location during evaluation: PACU Anesthesia Type: General Level of consciousness: awake and alert Pain management: pain level controlled Vital Signs Assessment: post-procedure vital signs reviewed and stable Respiratory status: spontaneous breathing, nonlabored ventilation, respiratory function stable and patient connected to nasal cannula oxygen Cardiovascular status: blood pressure returned to baseline and stable Postop Assessment: no apparent nausea or vomiting Anesthetic complications: no    Last Vitals:  Vitals:   06/22/19 1110 06/22/19 1118  BP: 125/75   Pulse: 62 70  Resp: 17 14  Temp:    SpO2: 100% 97%    Last Pain:  Vitals:   06/22/19 1110  TempSrc:   PainSc: 0-No pain                 Tyreck Bell

## 2019-06-22 NOTE — Progress Notes (Signed)
Bates H Okubo 10:02 AM  Subjective: Patient doing well postop no new complaints or problems and case discussed with surgical team does have a little bit of drainage and pain is worse over that site  Objective: Vital signs stable afebrile no acute distress exam please see preassessment evaluation very minimal increased liver tests white count increased a little other labs stable  Assessment: CBD stones on Intra-Op cholangiogram  Plan: Okay to proceed with ERCP with anesthesia assistance and the procedure was rediscussed with the patient  [facility]  office [phone] After 5PM or if no answer call [phone]

## 2019-06-22 NOTE — Anesthesia Preprocedure Evaluation (Addendum)
Anesthesia Evaluation  Patient identified by MRN, date of birth, ID band Patient awake    Reviewed: Allergy & Precautions, H&P , NPO status , Patient's Chart, lab work & pertinent test results  Airway Mallampati: I  TM Distance: >3 FB Neck ROM: Full    Dental no notable dental hx. (+) Edentulous Upper, Edentulous Lower   Pulmonary neg pulmonary ROS, sleep apnea and Continuous Positive Airway Pressure Ventilation , former smoker,    Pulmonary exam normal breath sounds clear to auscultation       Cardiovascular Exercise Tolerance: Good hypertension, Pt. on medications and Pt. on home beta blockers + CAD, + Past MI and + CABG  negative cardio ROS Normal cardiovascular exam Rhythm:Regular Rate:Normal  ECHO 18' - Left ventricle: The cavity size was mildly dilated. There was   mild concentric hypertrophy. Systolic function was mildly   reduced. 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 Diffuse hypokinesis. Doppler parameters are consistent   with abnormal left ventricular relaxation (grade 1 diastolic   dysfunction). Doppler parameters are consistent with   indeterminate ventricular filling pressure. - Aortic valve: Transvalvular velocity was within the normal range.   There was no stenosis. There was mild regurgitation.   Myoview 58'  The left ventricular ejection fraction is moderately decreased (30-44%).  Nuclear stress EF: 36%.  There was no ST segment deviation noted during stress.  There is a medium defect of moderate severity present in the apical anterior, apical septal and apex location. The defect is non-reversible. This is consistent with infarct. No ischemia noted.  This is an intermediate risk study.  There is evidence of transient ischemic dilatation with a TID of 1.27 which can be seen in multivessel CAD.   Neuro/Psych  Neuromuscular disease negative neurological ROS  negative psych ROS    GI/Hepatic negative GI ROS, Neg liver ROS,   Endo/Other  negative endocrine ROSMorbid obesity  Renal/GU Renal diseasenegative Renal ROS  negative genitourinary   Musculoskeletal negative musculoskeletal ROS (+)   Abdominal   Peds negative pediatric ROS (+)  Hematology negative hematology ROS (+)   Anesthesia Other Findings   Reproductive/Obstetrics negative OB ROS                             Anesthesia Physical  Anesthesia Plan  ASA: III  Anesthesia Plan: General   Post-op Pain Management:    Induction: Intravenous  PONV Risk Score and Plan: 2 and Ondansetron, Dexamethasone and Treatment may vary due to age or medical condition  Airway Management Planned: Oral ETT  Additional Equipment:   Intra-op Plan:   Post-operative Plan: Extubation in OR  Informed Consent: I have reviewed the patients History and Physical, chart, labs and discussed the procedure including the risks, benefits and alternatives for the proposed anesthesia with the patient or authorized representative who has indicated his/her understanding and acceptance.       Plan Discussed with: Anesthesiologist and CRNA  Anesthesia Plan Comments: (  Pt seen by cardiology in office 06/20/2019.  Per OV note, "Chart reviewed as part of pre-operative protocol coverage. Given past medical history and time since last visit, based on ACC/AHA guidelines, Joseph Bryan would be at acceptable risk for the planned procedure without further cardiovascular testing.   His RCRI is a class IV risk, 11% risk of major cardiac event.  He is able to complete greater than 4 METS of physical activity.")  Anesthesia Quick Evaluation  

## 2019-06-22 NOTE — Transfer of Care (Signed)
Immediate Anesthesia Transfer of Care Note  Patient: Joseph Bryan  Procedure(s) Performed: ENDOSCOPIC RETROGRADE CHOLANGIOPANCREATOGRAPHY (ERCP) (N/A ) SPHINCTEROTOMY REMOVAL OF STONES  Patient Location: PACU  Anesthesia Type:General  Level of Consciousness: drowsy, patient cooperative and responds to stimulation  Airway & Oxygen Therapy: Patient Spontanous Breathing and Patient connected to face mask oxygen  Post-op Assessment: Report given to RN and Post -op Vital signs reviewed and stable  Post vital signs: Reviewed and stable  Last Vitals:  Vitals Value Taken Time  BP    Temp    Pulse 60 06/22/19 1108  Resp 17 06/22/19 1108  SpO2 100 % 06/22/19 1108  Vitals shown include unvalidated device data.  Last Pain:  Vitals:   06/22/19 1001  TempSrc:   PainSc: 2       Patients Stated Pain Goal: 0 (37/09/64 3838)  Complications: No apparent anesthesia complications

## 2019-06-22 NOTE — Op Note (Signed)
[facility] Patient Name: Joseph Bryan Procedure Date: 06/22/2019 MRN: [id number] Attending MD: Clarene Essex , MD Date of Birth: 11/14/1940 CSN: [id number] Age: 79 Admit Type: Inpatient Procedure:                ERCP Indications:              For therapy of bile duct stone(s) found on Intra-Op                            cholangiogram Providers:                Clarene Essex, MD, Benetta Spar RN, RN, Theodora Blow,                            Technician Referring MD:              Medicines:                General Anesthesia Complications:            No immediate complications. Estimated Blood Loss:     Estimated blood loss: none. Procedure:                Pre-Anesthesia Assessment:                           - Prior to the procedure, a History and Physical                            was performed, and patient medications and                            allergies were reviewed. The patient's tolerance of                            previous anesthesia was also reviewed. The risks                            and benefits of the procedure and the sedation                            options and risks were discussed with the patient.                            All questions were answered, and informed consent                            was obtained. Prior Anticoagulants: The patient has                            taken no previous anticoagulant or antiplatelet                            agents. ASA Grade Assessment: II - A patient with                            mild systemic  disease. After reviewing the risks                            and benefits, the patient was deemed in                            satisfactory condition to undergo the procedure.                           After obtaining informed consent, the scope was                            passed under direct vision. Throughout the                            procedure, the patient's blood pressure, pulse, and                             oxygen saturations were monitored continuously. The                            TJF-Q180V ([id number] Olympus Duodenoscope was                            introduced through the mouth, and used to inject                            contrast into and used to cannulate the bile duct.                            The ERCP was accomplished without difficulty. The                            patient tolerated the procedure well. Scope In: Scope Out: Findings:      The major papilla was normal. Deep selective cannulation was obtained on       the first attempt and some stones were seen on initial cholangiogram and       the JAG Jagwire was advanced into the intrahepatics and we proceeded       with a biliary sphincterotomy which was made with a Hydratome       sphincterotome using ERBE electrocautery until we had adequate biliary       drainage and could get the fully bowed sphincterotome easily in and out       of the duct. There was no post-sphincterotomy bleeding. The common bile       duct contained few stones, the largest of which was medium-sized in       diameter. The biliary tree was swept with a 12 mm balloon starting at       the bifurcation. All stones were removed. Nothing was found on       subsequent balloon pull-through's. And there was no pancreatic duct       injection or wire advancement throughout the procedure an occlusion       cholangiogram was done which was normal and there was adequate biliary  drainage and the patient tolerated the procedure well there was no       immediate complication and the scope and balloon and wire were removed       there was no obvious cystic duct leak on occlusion cholangiogram Impression:               - The major papilla appeared normal.                           - Choledocholithiasis was found. Complete removal                            was accomplished by biliary sphincterotomy and                            balloon extraction.                            - A biliary sphincterotomy was performed.                           - The biliary tree was swept and nothing was found. Moderate Sedation:      Not Applicable - Patient had care per Anesthesia. Recommendation:           - Clear liquid diet for 6 hours. If doing well may                            advance to soft solids                           - Continue present medications.                           - Return to GI clinic PRN. If doing well in a.m.                            hopefully will be able to be discharged                           - Telephone GI clinic if symptomatic PRN.                           - Check liver enzymes (AST, ALT, alkaline                            phosphatase, bilirubin) and hemogram with white                            blood cell count and platelets in the morning. Procedure Code(s):        --- Professional ---                           [phone], Endoscopic retrograde                            cholangiopancreatography (  ERCP); with removal of                            calculi/debris from biliary/pancreatic duct(s)                           [id number], Endoscopic retrograde                            cholangiopancreatography (ERCP); with                            sphincterotomy/papillotomy Diagnosis Code(s):        --- Professional ---                           K80.50, Calculus of bile duct without cholangitis                            or cholecystitis without obstruction CPT copyright 2019 [employer]. All rights reserved. The codes documented in this report are preliminary and upon coder review may  be revised to meet current compliance requirements. Clarene Essex, MD 06/22/2019 11:00:41 AM This report has been signed electronically. Number of Addenda: 0

## 2019-06-22 NOTE — Anesthesia Procedure Notes (Signed)
Procedure Name: Intubation Date/Time: 06/22/2019 10:20 AM Performed by: Glory Buff, CRNA Pre-anesthesia Checklist: Patient identified, Emergency Drugs available, Suction available and Patient being monitored Patient Re-evaluated:Patient Re-evaluated prior to induction Oxygen Delivery Method: Circle system utilized Preoxygenation: Pre-oxygenation with 100% oxygen Induction Type: IV induction Ventilation: Mask ventilation without difficulty Laryngoscope Size: Miller and 3 Grade View: Grade I Tube type: Oral Tube size: 7.5 mm Number of attempts: 1 Airway Equipment and Method: Stylet and Oral airway Placement Confirmation: ETT inserted through vocal cords under direct vision,  positive ETCO2 and breath sounds checked- equal and bilateral Secured at: 22 cm Tube secured with: Tape Dental Injury: Teeth and Oropharynx as per pre-operative assessment

## 2019-06-23 ENCOUNTER — Encounter (HOSPITAL_COMMUNITY): Payer: Self-pay | Admitting: Gastroenterology

## 2019-06-23 LAB — COMPREHENSIVE METABOLIC PANEL
ALT: 21 U/L (ref 0–44)
AST: 40 U/L (ref 15–41)
Albumin: 2.6 g/dL — ABNORMAL LOW (ref 3.5–5.0)
Alkaline Phosphatase: 128 U/L — ABNORMAL HIGH (ref 38–126)
Anion gap: 9 (ref 5–15)
BUN: 18 mg/dL (ref 8–23)
CO2: 31 mmol/L (ref 22–32)
Calcium: 8.3 mg/dL — ABNORMAL LOW (ref 8.9–10.3)
Chloride: 95 mmol/L — ABNORMAL LOW (ref 98–111)
Creatinine, Ser: 0.77 mg/dL (ref 0.61–1.24)
GFR calc Af Amer: >60 mL/min (ref >60)
GFR calc non Af Amer: >60 mL/min (ref >60)
Glucose, Bld: 113 mg/dL — ABNORMAL HIGH (ref 70–99)
Potassium: 3.8 mmol/L (ref 3.5–5.1)
Sodium: 135 mmol/L (ref 135–145)
Total Bilirubin: 0.8 mg/dL (ref 0.3–1.2)
Total Protein: 6.5 g/dL (ref 6.5–8.1)

## 2019-06-23 LAB — CBC WITH DIFFERENTIAL/PLATELET
Abs Immature Granulocytes: 0.03 10*3/uL (ref 0.00–0.07)
Basophils Absolute: 0 10*3/uL (ref 0.0–0.1)
Basophils Relative: 0 %
Eosinophils Absolute: 0 10*3/uL (ref 0.0–0.5)
Eosinophils Relative: 0 %
HCT: 32.5 % — ABNORMAL LOW (ref 39.0–52.0)
Hemoglobin: 10 g/dL — ABNORMAL LOW (ref 13.0–17.0)
Immature Granulocytes: 0 %
Lymphocytes Relative: 9 %
Lymphs Abs: 0.9 10*3/uL (ref 0.7–4.0)
MCH: 29.1 pg (ref 26.0–34.0)
MCHC: 30.8 g/dL (ref 30.0–36.0)
MCV: 94.5 fL (ref 80.0–100.0)
Monocytes Absolute: 0.8 10*3/uL (ref 0.1–1.0)
Monocytes Relative: 7 %
Neutro Abs: 9.1 10*3/uL — ABNORMAL HIGH (ref 1.7–7.7)
Neutrophils Relative %: 84 %
Platelets: 130 10*3/uL — ABNORMAL LOW (ref 150–400)
RBC: 3.44 MIL/uL — ABNORMAL LOW (ref 4.22–5.81)
RDW: 15.2 % (ref 11.5–15.5)
WBC: 10.9 10*3/uL — ABNORMAL HIGH (ref 4.0–10.5)
nRBC: 0 % (ref 0.0–0.2)

## 2019-06-23 MED ORDER — OXYCODONE HCL 5 MG PO TABS: 5.0000 mg | ORAL_TABLET | ORAL | 0 refills | Status: DC | PRN

## 2019-06-23 NOTE — Progress Notes (Signed)
D/C instructions given to patient. Patient had no questions. NT or writer will wheel patient out once family comes in to pick him up

## 2019-06-23 NOTE — Progress Notes (Signed)
Joseph Bryan 9:47 AM  Subjective: Patient doing well without obvious post ERCP complication eating regular food no significant pain  Objective: Vital signs stable afebrile no acute distress abdomen is soft nontender liver test improved white count down  Assessment: Status post lap chole and ERCP  Plan: Happy to see back as an outpatient as needed agree with discharge as per surgical note  [facility]  office [phone] After 5PM or if no answer call [phone]

## 2019-06-23 NOTE — Discharge Summary (Signed)
Physician Discharge Summary [facility]  Patient ID: Joseph Bryan MRN: [id number] DOB/AGE: November 03, 1940 79 y.o.  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  Post op course was uncomplicated.  Pain was well controlled.  Tolerated diet.  Patient required ERCP by Dr. Watt Climes for choledocholithiasis, which was successful.  Patient was prepared for discharge home on POD#2.  Consults: GI  Treatments: surgery: lap chole with IOC, ERCP with stone extraction  Discharge Exam: Blood pressure (!) 126/58, pulse 62, temperature 97.6 F (36.4 C), temperature source Oral, resp. rate 17, height 6\' 1"  (1.854 m), weight 126.7 kg, SpO2 90 %. HEENT - clear Neck - soft Chest - clear bilaterally Cor - RRR Abd - JP drain RUQ with serosanguinous output - removed prior to discharge; wound sites dry and intact   Disposition: Home  Discharge Instructions    Diet - low sodium heart healthy   Complete by: As directed    Discharge instructions   Complete by: As directed    [facility]  LAPAROSCOPIC SURGERY:  POST-OP INSTRUCTIONS  Always review your discharge instruction sheet given to you by the facility where your surgery was performed.  A prescription for pain medication may be given to you upon discharge.  Take your pain medication as prescribed.  If narcotic pain medicine is not needed, then you may take acetaminophen (Tylenol) or ibuprofen (Advil) as needed.  Take your usually prescribed medications unless otherwise directed.  If you need a refill on your pain medication,  please contact your pharmacy.  They will contact our office to request authorization. Prescriptions will not be filled after 5 P.M. or on weekends.  You should follow a light diet the first few days after arrival home, such as soup and crackers or toast.  Be sure to include plenty of fluids daily.  Most patients will experience some swelling and bruising in the area of the incisions.  Ice packs will help.  Swelling and bruising can take several days to resolve.   It is common to experience some constipation after surgery.  Increasing fluid intake and taking a stool softener (such as Colace) will usually help or prevent this problem from occurring.  A mild laxative (Milk of Magnesia or Miralax) should be taken according to package instructions if there has been no bowel movement after 48 hours.  You will likely have Dermabond (topical glue) over your incisions.  This seals the incisions and allows you to bathe and shower at any time after your surgery.  Glue should remain in place for up to 10 days.  It may be removed after 10 days by pealing off the Dermabond material or using Vaseline or naval jelly to remove.  If you have steri-strips over your incisions, you may remove the gauze bandage on the second day after surgery, and you may shower at that time.  Leave your steri-strips (small skin tapes) in place directly over the incision.  These strips should remain on the skin for 5-7 days and then be removed.  You may get them wet in the shower and pat them dry.  Any sutures or staples will be removed at the office during your follow-up  visit.  ACTIVITIES:  You may resume regular (light) daily activities beginning the next day - such as daily self-care, walking, climbing stairs - gradually increasing activities as tolerated.  You may have sexual intercourse when it is comfortable.  Refrain from any heavy lifting or straining until approved by your doctor.  You may drive when you are no longer taking  prescription pain medication, when you can comfortably wear a seatbelt, and when you can safely maneuver your car and apply brakes.  You should see your doctor in the office for a follow-up appointment approximately 2-3 weeks after your surgery.  Make sure that you call for this appointment within a day or two after you arrive home to insure a convenient appointment time.  WHEN TO CALL YOUR DOCTOR: Fever over 101.0 Inability to urinate Continued bleeding from incision Increased pain, redness, or drainage from the incision Increasing abdominal pain  The clinic staff is available to answer your questions during regular business hours.  Please don't hesitate to call and ask to speak to one of the nurses for clinical concerns.  If you have a medical emergency, go to the nearest emergency room or call 911.  A surgeon from [facility] is always on call for the hospital.  Earnstine Regal, MD, [facility] Office: [place] Free:  [place] [phone]  Website: [email or web address]   Increase activity slowly   Complete by: As directed    No dressing needed   Complete by: As directed      Allergies as of 06/23/2019      Reactions   Other Rash, Other (See Comments)   CHG soap and wipes patient states today(06/21/19), he does not recall this allergy. He completed the pre-op chg baths without any problems.      Medication List    TAKE these medications   aspirin EC 81 MG tablet Take 1 tablet (81 mg total) by mouth daily.   atenolol 50 MG tablet Commonly known as: TENORMIN Take 1.5 tablets (75 mg total) by mouth daily. What changed: when to take this   BEE POLLEN PO Take 3 capsules by mouth daily.   gabapentin 300 MG capsule Commonly known as: NEURONTIN TAKE 1 CAPSULE BY MOUTH TWO TIMES DAILY What changed: See the new instructions.   metolazone 2.5 MG tablet Commonly known as: ZAROXOLYN TAKE EVERY OTHER DAY (20-30  MINUTES PRIOR TOMORNING DOSE OF TORSEMIDE)   metolazone 5 MG tablet Commonly known as: ZAROXOLYN Take 2.5 mg by mouth 2 (two) times a week.   oxyCODONE 5 MG immediate release tablet Commonly known as: Oxy IR/ROXICODONE Take 1-2 tablets (5-10 mg total) by mouth every 4 (four) hours as needed for moderate pain.   potassium chloride SA 20 MEQ tablet Commonly known as: KLOR-CON TAKE 1 TABLET BY MOUTH DAILY DAILY WHEN YOU TAKE A METOLAZONE What changed:   how much to take  how to take this  when to take this  additional instructions   pyridOXINE 100 MG tablet Commonly known as: VITAMIN B-6 Take 100 mg by mouth daily.   rosuvastatin 20 MG tablet Commonly known as: CRESTOR TAKE 1 TABLET BY MOUTH DAILY What changed: when to take this   torsemide 20 MG tablet Commonly known as: DEMADEX Take 1 tablet (20 mg total) by mouth daily. What changed: when to take this   traMADol 50 MG tablet Commonly known as: Ultram Take 1-2 tablets (50-100 mg total) by mouth every 6 (six) hours as  needed for moderate pain.   VITAMIN B-12 PO Take 2,000 mcg by mouth daily.   vitamin C 1000 MG tablet Take 1,000 mg by mouth daily.            Discharge Care Instructions  (From admission, onward)         Start     Ordered   06/23/19 0000  No dressing needed        06/23/19 0920           Earnstine Regal, MD, [facility] Office: [phone]   Signed: Armandina Gemma 06/23/2019, 9:20 AM

## 2019-09-30 ENCOUNTER — Other Ambulatory Visit: Payer: Self-pay | Admitting: Oncology

## 2019-09-30 DIAGNOSIS — U071 COVID-19: Secondary | ICD-10-CM

## 2019-09-30 NOTE — Progress Notes (Signed)
I connected by phone with  Mr. Marrow to discuss the potential use of an new treatment for mild to moderate COVID-19 viral infection in non-hospitalized patients.   This patient is a age/sex that meets the FDA criteria for Emergency Use Authorization of casirivimab\imdevimab.  Has a (+) direct SARS-CoV-2 viral test result 1. Has mild or moderate COVID-19  2. Is ? 79 years of age and weighs ? 40 kg 3. Is NOT hospitalized due to COVID-19 4. Is NOT requiring oxygen therapy or requiring an increase in baseline oxygen flow rate due to COVID-19 5. Is within 10 days of symptom onset 6. Has at least one of the high risk factor(s) for progression to severe COVID-19 and/or hospitalization as defined in EUA. Specific high risk criteria : Past Medical History:  Diagnosis Date  . 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 Coronary artery disease   . Glaucoma   . Heart disease   . History of kidney stones   . Hyperlipidemia   . Hypertension   . Myocardial infarction ([place]) 17 yrs ago  . Neuromuscular disorder (HCC)    neuropathy feet  . Neuropathy    feet  . Pneumonia   . Sleep apnea with use of continuous positive airway pressure (CPAP)    uses CPAP  ?  ?    Symptom onset  09/21/2019   I have spoken and communicated the following to the patient or parent/caregiver:   1. FDA has authorized the emergency use of casirivimab\imdevimab for the treatment of mild to moderate COVID-19 in adults and pediatric patients with positive results of direct SARS-CoV-2 viral testing who are 48 years of age and older weighing at least 40 kg, and who are at high risk for progressing to severe COVID-19 and/or hospitalization.   2. The significant known and potential risks and benefits of casirivimab\imdevimab, and the extent to which such potential risks and benefits are unknown.   3. Information on available alternative treatments and the  risks and benefits of those alternatives, including clinical trials.   4. Patients treated with casirivimab\imdevimab should continue to self-isolate and use infection control measures (e.g., wear mask, isolate, social distance, avoid sharing personal items, clean and disinfect "high touch" surfaces, and frequent handwashing) according to CDC guidelines.    5. The patient or parent/caregiver has the option to accept or refuse casirivimab\imdevimab .   After reviewing this information with the patient, The patient agreed to proceed with receiving casirivimab\imdevimab infusion and will be provided a copy of the Fact sheet prior to receiving the infusion.Rulon Abide, AGNP-C [phone] ([place])

## 2019-10-01 ENCOUNTER — Ambulatory Visit (HOSPITAL_COMMUNITY)
Admission: RE | Admit: 2019-10-01 | Discharge: 2019-10-01 | Disposition: A | Discharge status: Home / Self Care | Payer: Medicare Other | Source: Ambulatory Visit | Attending: Pulmonary Disease | Admitting: Pulmonary Disease

## 2019-10-01 DIAGNOSIS — U071 COVID-19: Secondary | ICD-10-CM | POA: Diagnosis present

## 2019-10-01 DIAGNOSIS — Z23 Encounter for immunization: Secondary | ICD-10-CM | POA: Insufficient documentation

## 2019-10-01 MED ORDER — FAMOTIDINE IN NACL 20-0.9 MG/50ML-% IV SOLN: 20.0000 mg | Freq: Once | INTRAVENOUS | Status: DC | PRN

## 2019-10-01 MED ORDER — DIPHENHYDRAMINE HCL 50 MG/ML IJ SOLN: 50.0000 mg | Freq: Once | INTRAMUSCULAR | Status: DC | PRN

## 2019-10-01 MED ORDER — EPINEPHRINE 0.3 MG/0.3ML IJ SOAJ: 0.3000 mg | Freq: Once | INTRAMUSCULAR | Status: DC | PRN

## 2019-10-01 MED ORDER — SODIUM CHLORIDE 0.9 % IV SOLN: INTRAVENOUS | Status: DC | PRN

## 2019-10-01 MED ORDER — METHYLPREDNISOLONE SODIUM SUCC 125 MG IJ SOLR: 125.0000 mg | Freq: Once | INTRAMUSCULAR | Status: DC | PRN

## 2019-10-01 MED ORDER — ALBUTEROL SULFATE HFA 108 (90 BASE) MCG/ACT IN AERS: 2.0000 | INHALATION_SPRAY | Freq: Once | RESPIRATORY_TRACT | Status: DC | PRN

## 2019-10-01 MED ORDER — SODIUM CHLORIDE 0.9 % IV SOLN
1200.0000 mg | Freq: Once | INTRAVENOUS | Status: AC
  Administered 2019-10-01: 1200 mg via INTRAVENOUS

## 2019-10-01 NOTE — Discharge Instructions (Signed)

## 2019-10-01 NOTE — Progress Notes (Signed)
  Diagnosis: COVID-19  Physician:Dr Joya Gaskins  Procedure: [facility] Med: casirivimab\imdevimab infusion - Provided patient with casirivimab\imdevimab fact sheet for patients, parents and caregivers prior to infusion.  Complications: No immediate complications noted.  Discharge: Discharged home   Joseph Bryan 10/01/2019   Pt will take maintenance meds for blood pressure at home.

## 2019-11-06 ENCOUNTER — Emergency Department (HOSPITAL_COMMUNITY): Payer: Medicare Other

## 2019-11-06 ENCOUNTER — Other Ambulatory Visit: Payer: Self-pay

## 2019-11-06 ENCOUNTER — Emergency Department (HOSPITAL_COMMUNITY)
Admission: EM | Admit: 2019-11-06 | Discharge: 2019-11-07 | Disposition: A | Discharge status: Home / Self Care | Payer: Medicare Other | Attending: Emergency Medicine | Admitting: Emergency Medicine

## 2019-11-06 ENCOUNTER — Encounter (HOSPITAL_COMMUNITY): Payer: Self-pay | Admitting: Emergency Medicine

## 2019-11-06 DIAGNOSIS — R1084 Generalized abdominal pain: Secondary | ICD-10-CM

## 2019-11-06 DIAGNOSIS — Z7982 Long term (current) use of aspirin: Secondary | ICD-10-CM | POA: Insufficient documentation

## 2019-11-06 DIAGNOSIS — I2510 Atherosclerotic heart disease of native coronary artery without angina pectoris: Secondary | ICD-10-CM | POA: Insufficient documentation

## 2019-11-06 DIAGNOSIS — N189 Chronic kidney disease, unspecified: Secondary | ICD-10-CM | POA: Insufficient documentation

## 2019-11-06 DIAGNOSIS — Z87891 Personal history of nicotine dependence: Secondary | ICD-10-CM | POA: Diagnosis not present

## 2019-11-06 DIAGNOSIS — I129 Hypertensive chronic kidney disease with stage 1 through stage 4 chronic kidney disease, or unspecified chronic kidney disease: Secondary | ICD-10-CM | POA: Insufficient documentation

## 2019-11-06 DIAGNOSIS — R188 Other ascites: Secondary | ICD-10-CM | POA: Diagnosis not present

## 2019-11-06 DIAGNOSIS — Z79899 Other long term (current) drug therapy: Secondary | ICD-10-CM | POA: Insufficient documentation

## 2019-11-06 DIAGNOSIS — Z85038 Personal history of other malignant neoplasm of large intestine: Secondary | ICD-10-CM | POA: Insufficient documentation

## 2019-11-06 HISTORY — DX: Disorder of kidney and ureter, unspecified: N28.9

## 2019-11-06 LAB — GLUCOSE, PLEURAL OR PERITONEAL FLUID: Glucose, Fluid: 103 mg/dL

## 2019-11-06 LAB — CBC WITH DIFFERENTIAL/PLATELET
Abs Immature Granulocytes: 0.02 10*3/uL (ref 0.00–0.07)
Basophils Absolute: 0.1 10*3/uL (ref 0.0–0.1)
Basophils Relative: 1 %
Eosinophils Absolute: 0 10*3/uL (ref 0.0–0.5)
Eosinophils Relative: 1 %
HCT: 40.4 % (ref 39.0–52.0)
Hemoglobin: 12.7 g/dL — ABNORMAL LOW (ref 13.0–17.0)
Immature Granulocytes: 0 %
Lymphocytes Relative: 19 %
Lymphs Abs: 1.2 10*3/uL (ref 0.7–4.0)
MCH: 29.3 pg (ref 26.0–34.0)
MCHC: 31.4 g/dL (ref 30.0–36.0)
MCV: 93.1 fL (ref 80.0–100.0)
Monocytes Absolute: 0.6 10*3/uL (ref 0.1–1.0)
Monocytes Relative: 10 %
Neutro Abs: 4.3 10*3/uL (ref 1.7–7.7)
Neutrophils Relative %: 69 %
Platelets: 168 10*3/uL (ref 150–400)
RBC: 4.34 MIL/uL (ref 4.22–5.81)
RDW: 18.5 % — ABNORMAL HIGH (ref 11.5–15.5)
WBC: 6.1 10*3/uL (ref 4.0–10.5)
nRBC: 0 % (ref 0.0–0.2)

## 2019-11-06 LAB — BODY FLUID CELL COUNT WITH DIFFERENTIAL
Eos, Fluid: 0 %
Lymphs, Fluid: 39 %
Monocyte-Macrophage-Serous Fluid: 54 % (ref 50–90)
Neutrophil Count, Fluid: 7 % (ref 0–25)
Total Nucleated Cell Count, Fluid: 228 cu mm (ref 0–1000)

## 2019-11-06 LAB — COMPREHENSIVE METABOLIC PANEL
ALT: 26 U/L (ref 0–44)
AST: 49 U/L — ABNORMAL HIGH (ref 15–41)
Albumin: 2.6 g/dL — ABNORMAL LOW (ref 3.5–5.0)
Alkaline Phosphatase: 128 U/L — ABNORMAL HIGH (ref 38–126)
Anion gap: 11 (ref 5–15)
BUN: 12 mg/dL (ref 8–23)
CO2: 25 mmol/L (ref 22–32)
Calcium: 8.5 mg/dL — ABNORMAL LOW (ref 8.9–10.3)
Chloride: 98 mmol/L (ref 98–111)
Creatinine, Ser: 0.91 mg/dL (ref 0.61–1.24)
GFR, Estimated: >60 mL/min (ref >60)
Glucose, Bld: 98 mg/dL (ref 70–99)
Potassium: 4.1 mmol/L (ref 3.5–5.1)
Sodium: 134 mmol/L — ABNORMAL LOW (ref 135–145)
Total Bilirubin: 1.8 mg/dL — ABNORMAL HIGH (ref 0.3–1.2)
Total Protein: 6.9 g/dL (ref 6.5–8.1)

## 2019-11-06 LAB — LACTIC ACID, PLASMA: Lactic Acid, Venous: 1.7 mmol/L (ref 0.5–1.9)

## 2019-11-06 LAB — LIPASE, BLOOD: Lipase: 17 U/L (ref 11–51)

## 2019-11-06 LAB — PROTEIN, PLEURAL OR PERITONEAL FLUID: Total protein, fluid: <3 g/dL

## 2019-11-06 LAB — LACTATE DEHYDROGENASE, PLEURAL OR PERITONEAL FLUID: LD, Fluid: 40 U/L — ABNORMAL HIGH (ref 3–23)

## 2019-11-06 MED ORDER — LACTATED RINGERS IV BOLUS
1000.0000 mL | Freq: Once | INTRAVENOUS | Status: AC
  Administered 2019-11-06: 1000 mL via INTRAVENOUS

## 2019-11-06 MED ORDER — MORPHINE SULFATE (PF) 4 MG/ML IV SOLN
4.0000 mg | Freq: Once | INTRAVENOUS | Status: AC
  Administered 2019-11-06: 4 mg via INTRAVENOUS
  Filled 2019-11-06: qty 1

## 2019-11-06 MED ORDER — SODIUM CHLORIDE (PF) 0.9 % IJ SOLN
INTRAMUSCULAR | Status: AC
  Filled 2019-11-06: qty 50

## 2019-11-06 MED ORDER — LIDOCAINE-EPINEPHRINE 2 %-1:100000 IJ SOLN
20.0000 mL | Freq: Once | INTRAMUSCULAR | Status: DC
  Filled 2019-11-06: qty 1

## 2019-11-06 MED ORDER — FENTANYL CITRATE (PF) 100 MCG/2ML IJ SOLN
50.0000 ug | Freq: Once | INTRAMUSCULAR | Status: AC
  Administered 2019-11-06: 50 ug via INTRAVENOUS
  Filled 2019-11-06: qty 2

## 2019-11-06 MED ORDER — IOHEXOL 300 MG/ML  SOLN
100.0000 mL | Freq: Once | INTRAMUSCULAR | Status: AC | PRN
  Administered 2019-11-06: 100 mL via INTRAVENOUS

## 2019-11-06 NOTE — ED Provider Notes (Signed)
[facility] DEPT Provider Note   CSN: [id number] Arrival date & time: 11/06/19  1251     History Chief Complaint  Patient presents with  . Abdominal Pain    Joseph Bryan is a 79 y.o. male.  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 PT reports 3 days of persistent, progressively worsening lower abdominal pain associated w/ N/V when he tries to eat. HE denies diarrhea, had normal, non-bloody BM today. No urinary symptoms, fevers, cough/cold sx. No meds PTA.  The history is provided by the patient.  Abdominal Pain      Past Medical History:  Diagnosis Date  . 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 Coronary artery disease   . Glaucoma   . Heart disease   . History of kidney stones   . Hyperlipidemia   . Hypertension   . Myocardial infarction ([place]) 17 yrs ago  . Neuromuscular disorder (HCC)    neuropathy feet  . Neuropathy    feet  . Pneumonia   . Renal insufficiency   . Sleep apnea with use of continuous positive airway pressure (CPAP)    uses CPAP    Patient Active Problem List   Diagnosis Date Noted  . 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 Cholelithiasis with chronic cholecystitis 06/11/2019  . Elevated liver enzymes 06/11/2019  . Lumbar spinal stenosis 01/16/2017  . Hyperlipidemia LDL goal <70 04/30/2016  . Elevated LFTs 04/30/2016  . Spinal stenosis, lumbar region, with neurogenic claudication 10/19/2015  . Morbid obesity ([place]) 12/15/2013  . Leg edema 10/08/2012  . OSA on CPAP 10/08/2012  . RBBB 10/08/2012  . History of MI (myocardial infarction) 10/08/2012  . Anasarca 09/01/2012  . Abdominal pain, acute, right lower quadrant 08/26/2012  . HTN (hypertension) 08/26/2012  . Obesity, unspecified 08/26/2012  . CAD-  s/p CABG in 1998 - patent grafts on cath in 2010 08/26/2012    Past Surgical History:  Procedure Laterality Date  . APPENDECTOMY  2014  . CHOLECYSTECTOMY N/A 06/21/2019   Procedure: LAPAROSCOPIC CHOLECYSTECTOMY WITH INTRAOPERATIVE CHOLANGIOGRAM;  Surgeon: Armandina Gemma, MD;  Location: [facility];  Service: General;  Laterality: N/A;  . COLON RESECTION N/A 08/27/2012   Procedure: Diagnostic laparoscopy; Ileocecectomy;  Surgeon: Madilyn Hook, DO;  Location: [facility];  Service: General;  Laterality: N/A;  . COLONOSCOPY WITH PROPOFOL N/A 04/09/2015   Procedure: COLONOSCOPY WITH PROPOFOL;  Surgeon: Garlan Fair, MD;  Location: [facility];  Service: Endoscopy;  Laterality: N/A;  . CORONARY ARTERY BYPASS GRAFT  1998   x5  . CYSTOSCOPY/URETEROSCOPY/HOLMIUM LASER/STENT PLACEMENT Right 02/25/2018   Procedure: RIGHT URETEROSCOPY/HOLMIUM LASER/ STONE REMOVAL  STENT PLACEMENT;  Surgeon: Ardis Hughs, MD;  Location: [facility];  Service: Urology;  Laterality: Right;  . ERCP N/A 06/22/2019   Procedure: ENDOSCOPIC RETROGRADE CHOLANGIOPANCREATOGRAPHY (ERCP);  Surgeon: Clarene Essex, MD;  Location: [facility];  Service: Endoscopy;  Laterality: N/A;  . EYE SURGERY     cataract and glaucome  . LUMBAR LAMINECTOMY/DECOMPRESSION MICRODISCECTOMY N/A 10/19/2015   Procedure: Lumbar three to four LAMINECTOMY/FORAMINOTOMY;  Surgeon: Erline Levine, MD;  Location: [facility] [facility];  Service: Neurosurgery;  Laterality: N/A;  . 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  REMOVAL OF STONES  06/22/2019   Procedure: REMOVAL OF STONES;  Surgeon: Clarene Essex, MD;  Location: [facility];  Service: Endoscopy;;  . Joan Mayans  06/22/2019   Procedure: Joan Mayans;  Surgeon: Clarene Essex, MD;  Location: [facility];  Service: Endoscopy;;       Family History  Problem Relation Age of Onset  . Hypertension Father   . Hypertension Sister   . Heart attack Brother     Social History   Tobacco Use   . Smoking status: Former Smoker    Types: Cigarettes    Quit date: 08/25/1988    Years since quitting: 31.2  . Smokeless tobacco: Never Used  Vaping Use  . Vaping Use: Never used  Substance Use Topics  . Alcohol use: No    Alcohol/week: 0.0 standard drinks  . Drug use: No    Home Medications Prior to Admission medications   Medication Sig Start Date End Date Taking? Authorizing Provider  Ascorbic Acid (VITAMIN C) 1000 MG tablet Take 1,000 mg by mouth every evening.    Yes [provider]  aspirin (ECOTRIN) 325 MG EC tablet Take 325 mg by mouth every evening.   Yes [provider]  atenolol (TENORMIN) 50 MG tablet Take 1.5 tablets (75 mg total) by mouth daily. Patient taking differently: Take 75 mg by mouth every evening.  06/01/19  Yes Troy Sine, MD  BEE POLLEN PO Take 3 capsules by mouth every evening.    Yes [provider]  famotidine (PEPCID) 20 MG tablet Take 20 mg by mouth every evening. 09/26/19  Yes [provider]  gabapentin (NEURONTIN) 300 MG capsule TAKE 1 CAPSULE BY MOUTH TWO TIMES DAILY Patient taking differently: Take 300 mg by mouth every evening.  09/30/18  Yes Troy Sine, MD  ondansetron (ZOFRAN) 8 MG tablet Take 8 mg by mouth 3 (three) times daily as needed for nausea or vomiting.  09/26/19  Yes [provider]  pyridOXINE (VITAMIN B-6) 100 MG tablet Take 100 mg by mouth every evening.    Yes [provider]  rosuvastatin (CRESTOR) 20 MG tablet TAKE 1 TABLET BY MOUTH DAILY Patient taking differently: Take 20 mg by mouth every evening.  04/14/19  Yes Troy Sine, MD  torsemide (DEMADEX) 20 MG tablet Take 1 tablet (20 mg total) by mouth daily. Patient taking differently: Take 20 mg by mouth every evening.  06/01/19  Yes Troy Sine, MD  vitamin B-12 (CYANOCOBALAMIN) 100 MCG tablet Take 100 mcg by mouth every evening.    Yes [provider]    Allergies    Other  Review of Systems   Review  of Systems  Gastrointestinal: Positive for abdominal pain.   All other systems reviewed and are negative except that which was mentioned in HPI  Physical Exam Updated Vital Signs BP 133/78 (BP Location: Right Arm)   Pulse 61   Temp 98.2 F (36.8 C) (Oral)   Resp 18   SpO2 94%   Physical Exam Vitals and nursing note reviewed.  Constitutional:      General: He is not in acute distress.    Appearance: He is well-developed.  HENT:     Head: Normocephalic and atraumatic.     Mouth/Throat:     Comments: Dry mouth Eyes:     Conjunctiva/sclera: Conjunctivae normal.  Cardiovascular:     Rate and Rhythm: Normal rate and regular rhythm.     Heart sounds: Normal heart sounds. No murmur heard.  Pulmonary:     Effort: Pulmonary effort is normal.     Breath sounds: Normal breath sounds.  Abdominal:     General: Bowel sounds are normal. There is distension (mild).     Palpations: Abdomen is soft.     Tenderness: There is abdominal tenderness.     Comments: Tenderness across lower abd including RLQ, LLQ, and suprapubic abd  Musculoskeletal:     Cervical back: Neck supple.  Skin:    General: Skin is warm and dry.  Neurological:     Mental Status: He is alert and oriented to person, place, and time.     Comments: Fluent speech  Psychiatric:        Mood and Affect: Mood normal.        Judgment: Judgment normal.     ED Results / Procedures / Treatments   Labs (all labs ordered are listed, but only abnormal results are displayed) Labs Reviewed  COMPREHENSIVE METABOLIC PANEL - Abnormal; Notable for the following components:      Result Value   Sodium 134 (*)    Calcium 8.5 (*)    Albumin 2.6 (*)    AST 49 (*)    Alkaline Phosphatase 128 (*)    Total Bilirubin 1.8 (*)    All other components within normal limits  CBC WITH DIFFERENTIAL/PLATELET - Abnormal; Notable for the following components:   Hemoglobin 12.7 (*)    RDW 18.5 (*)    All other components within normal limits   LACTATE DEHYDROGENASE, PLEURAL OR PERITONEAL FLUID - Abnormal; Notable for the following components:   LD, Fluid 40 (*)    All other components within normal limits  BODY FLUID CULTURE  GRAM STAIN  GRAM STAIN  LIPASE, BLOOD  LACTIC ACID, PLASMA  GLUCOSE, PLEURAL OR PERITONEAL FLUID  PROTEIN, PLEURAL OR PERITONEAL FLUID  URINALYSIS, ROUTINE W REFLEX MICROSCOPIC  ALBUMIN, PLEURAL OR PERITONEAL FLUID  BODY FLUID CELL COUNT WITH DIFFERENTIAL  CYTOLOGY - NON PAP    EKG None  Radiology CT Abdomen Pelvis W Contrast  Result Date: 11/06/2019 CLINICAL DATA:  Abdominal pain and vomiting. EXAM: CT ABDOMEN AND PELVIS WITH CONTRAST TECHNIQUE: Multidetector CT imaging of the abdomen and pelvis was performed using the standard protocol following bolus administration of intravenous contrast. CONTRAST:  131mL OMNIPAQUE IOHEXOL 300 MG/ML  SOLN COMPARISON:  None. FINDINGS: Lower chest: Small pleural effusions. Hepatobiliary: Moderate volume of free fluid surrounding the margin liver. Liver has a lobular contour. Post cholecystectomy. Pancreas: Pancreas is atrophic. No mass lesion identified. There is no dilatation of the common bile duct or pancreatic duct. Spleen: Normal volume spleen Adrenals/urinary tract: Adrenal glands and RIGHT kidney are normal. The LEFT kidney is severely atrophic with benign appearing cysts. RIGHT ureter is normal. Bladder normal. Stomach/Bowel: Stomach, small bowel are normal. There is some fluid lesion the mesentery. Post appendectomy. Ascending and transverse colon normal. Descending colon rectosigmoid colon normal Vascular/Lymphatic: Abdominal aorta is normal caliber with atherosclerotic calcification. There is no retroperitoneal or periportal lymphadenopathy. No pelvic lymphadenopathy. Reproductive: Prostate normal Other: No organized fluid collections. Moderate volume of free fluid along LEFT and RIGHT pericolic gutter and collecting in the pelvis. Is new from comparison exam.  Musculoskeletal: No aggressive osseous lesion. IMPRESSION: 1. Dominant finding is new moderate volume intraperitoneal free fluid suggest ascites. Fluid along the liver, spleen, pericolic gutters and collecting in the pelvis. Fluid within leaves of mesentery additionally. No organized fluid collections. 2. Lobular contour of the liver could indicate underlying cirrhosis. No  evidence of portal hypertension. Normal volume spleen. No vascular collateralization. Otherwise no clear explanation for intraperitoneal free fluid. Cholecystectomy in June 21. 3. Atrophic LEFT kidney. Electronically Signed   By: Suzy Bouchard M.D.   On: 11/06/2019 15:31    Procedures ABDOMINAL PARACENTESIS  Date/Time: 11/06/2019 11:01 PM Performed by: Sharlett Iles, MD Authorized by: Sharlett Iles, MD  Consent: Written consent obtained. Risks and benefits: risks, benefits and alternatives were discussed Consent given by: patient Patient identity confirmed: verbally with patient Preparation: Patient was prepped and draped in the usual sterile fashion. Local anesthesia used: yes Anesthesia: local infiltration  Anesthesia: Local anesthesia used: yes Local Anesthetic: lidocaine 1% without epinephrine  Sedation: Patient sedated: no  Patient tolerance: patient tolerated the procedure well with no immediate complications Comments: Diagnostic and therapeutic paracentesis performed using sterile technique. ~2035ml removed, fluid sent for analysis.     (including critical care time)  Medications Ordered in ED Medications  sodium chloride (PF) 0.9 % injection (has no administration in time range)  lidocaine-EPINEPHrine (XYLOCAINE W/EPI) 2 %-1:100000 (with pres) injection 20 mL (20 mLs Intradermal Not Given 11/06/19 1704)  fentaNYL (SUBLIMAZE) injection 50 mcg (50 mcg Intravenous Given 11/06/19 1349)  lactated ringers bolus 1,000 mL (0 mLs Intravenous Stopped 11/06/19 1702)  iohexol (OMNIPAQUE) 300 MG/ML  solution 100 mL (100 mLs Intravenous Contrast Given 11/06/19 1458)  morphine 4 MG/ML injection 4 mg (4 mg Intravenous Given 11/06/19 1539)    ED Course  I have reviewed the triage vital signs and the nursing notes.  Pertinent labs & imaging results that were available during my care of the patient were reviewed by me and considered in my medical decision making (see chart for details).    MDM Rules/Calculators/A&P                          Nontoxic on exam, vital signs reassuring.  He did have mild distention and tenderness across lower abdomen.  Differential diagnosis includes bowel obstruction, diverticulitis, bowel perforation, UTI.  Obtain lab work and CT scan.  Labs show normal WBC count, normal creatinine, AST 49, ALT 26, tbili 1.8, normal lipase. Ct shows moderate volume ascites; abnormal contour of liver which may suggest cirrhosis, no other acute findings. PT denies h/o liver problems, h/o heavy alcohol use, heavy tylenol use, or history of hepatitis. Because of ongoing abdominal pain w/ presence of ascites, ddx includes SBP. Recommended paracentesis for fluid analysis. Consented pt for procedure. See procedure note for details.   Fluid analysis shows glucose 103, negative protein, LDH 40. Gram stain and cell count pending. Pt signed out to oncoming provider pending results. He feels better on reassessment and nausea/pain have resolved. Tolerating water. Final Clinical Impression(s) / ED Diagnoses Final diagnoses:  None    Rx / DC Orders ED Discharge Orders    None       Cassidi Modesitt, Wenda Overland, MD 11/06/19 2303

## 2019-11-06 NOTE — ED Triage Notes (Signed)
Patient c/o abdominal pain and vomiting x3 days. Last BM today.

## 2019-11-06 NOTE — Discharge Instructions (Addendum)
The fluid in your abdomen is likely coming from your liver.  You need to have close follow-up with your gastroenterology specialist to further determine what is causing your liver problems

## 2019-11-06 NOTE — ED Notes (Signed)
To CT

## 2019-11-06 NOTE — ED Notes (Signed)
Pt offered ice water, did not experience any coughing, choking, or other difficulties and was able to finish the water in his cup. RN informed of results.

## 2019-11-07 LAB — HEPATITIS PANEL, ACUTE
HCV Ab: NONREACTIVE
Hep A IgM: NONREACTIVE
Hep B C IgM: NONREACTIVE
Hepatitis B Surface Ag: NONREACTIVE

## 2019-11-07 LAB — GRAM STAIN

## 2019-11-07 LAB — URINALYSIS, ROUTINE W REFLEX MICROSCOPIC
Bilirubin Urine: NEGATIVE
Glucose, UA: NEGATIVE mg/dL
Hgb urine dipstick: NEGATIVE
Ketones, ur: 5 mg/dL — AB
Leukocytes,Ua: NEGATIVE
Nitrite: NEGATIVE
Protein, ur: NEGATIVE mg/dL
Specific Gravity, Urine: 1.02 (ref 1.005–1.030)
pH: 8 (ref 5.0–8.0)

## 2019-11-07 LAB — ALBUMIN, PLEURAL OR PERITONEAL FLUID: Albumin, Fluid: <3.5 g/dL

## 2019-11-07 MED ORDER — FENTANYL CITRATE (PF) 100 MCG/2ML IJ SOLN
50.0000 ug | Freq: Once | INTRAMUSCULAR | Status: AC
  Administered 2019-11-07: 50 ug via INTRAVENOUS
  Filled 2019-11-07: qty 2

## 2019-11-07 MED ORDER — OXYCODONE HCL 5 MG PO TABS
5.0000 mg | ORAL_TABLET | Freq: Once | ORAL | Status: AC
  Administered 2019-11-07: 5 mg via ORAL
  Filled 2019-11-07: qty 1

## 2019-11-07 MED ORDER — OXYCODONE HCL 5 MG PO TABS: 5.0000 mg | ORAL_TABLET | Freq: Four times a day (QID) | ORAL | 0 refills | Status: DC | PRN

## 2019-11-07 NOTE — ED Provider Notes (Signed)
I assumed care in signout to follow-up on ascitic lab test.  No convincing signs of spontaneous bacterial peritonitis.  Urinalysis is negative.  Overall patient feels improved.  His abdomen is soft no tenderness.  He feels comfortable for discharge home. Advised he will need follow-up as an outpatient for further evaluation of his likely cirrhosis.  Short course of oxycodone has been provided. He can f/u with GI specialist Hepatitis panel ordered   Ripley Fraise, MD 11/07/19 [phone]

## 2019-11-08 LAB — CYTOLOGY - NON PAP

## 2019-11-10 LAB — BODY FLUID CULTURE
Culture: NO GROWTH
Special Requests: NORMAL

## 2019-12-15 ENCOUNTER — Telehealth: Payer: Self-pay

## 2019-12-15 ENCOUNTER — Telehealth (INDEPENDENT_AMBULATORY_CARE_PROVIDER_SITE_OTHER): Payer: Medicare Other | Admitting: Physician Assistant

## 2019-12-15 ENCOUNTER — Telehealth: Payer: Self-pay | Admitting: Physician Assistant

## 2019-12-15 ENCOUNTER — Encounter: Payer: Self-pay | Admitting: Physician Assistant

## 2019-12-15 VITALS — BP 115/84 | Ht 73.0 in | Wt 261.0 lb

## 2019-12-15 DIAGNOSIS — I25810 Atherosclerosis of coronary artery bypass graft(s) without angina pectoris: Secondary | ICD-10-CM

## 2019-12-15 DIAGNOSIS — E785 Hyperlipidemia, unspecified: Secondary | ICD-10-CM

## 2019-12-15 DIAGNOSIS — I10 Essential (primary) hypertension: Secondary | ICD-10-CM | POA: Diagnosis not present

## 2019-12-15 DIAGNOSIS — Z8679 Personal history of other diseases of the circulatory system: Secondary | ICD-10-CM | POA: Diagnosis not present

## 2019-12-15 DIAGNOSIS — R188 Other ascites: Secondary | ICD-10-CM

## 2019-12-15 DIAGNOSIS — K7460 Unspecified cirrhosis of liver: Secondary | ICD-10-CM

## 2019-12-15 MED ORDER — ASPIRIN EC 81 MG PO TBEC: 81.0000 mg | DELAYED_RELEASE_TABLET | Freq: Every day | ORAL | 3 refills | Status: DC

## 2019-12-15 NOTE — Telephone Encounter (Signed)
  Patient Consent for Virtual Visit         Joseph Bryan has provided verbal consent on 12/15/2019 for a virtual visit (video or telephone).   CONSENT FOR VIRTUAL VISIT FOR:  Joseph Bryan  By participating in this virtual visit I agree to the following:  I hereby voluntarily request, consent and authorize [place] and its employed or contracted physicians, physician assistants, nurse practitioners or other licensed health care professionals (the Practitioner), to provide me with telemedicine health care services (the "Services") as deemed necessary by the treating Practitioner. I acknowledge and consent to receive the Services by the Practitioner via telemedicine. I understand that the telemedicine visit will involve communicating with the Practitioner through live audiovisual communication technology and the disclosure of certain medical information by electronic transmission. I acknowledge that I have been given the opportunity to request an in-person assessment or other available alternative prior to the telemedicine visit and am voluntarily participating in the telemedicine visit.  I understand that I have the right to withhold or withdraw my consent to the use of telemedicine in the course of my care at any time, without affecting my right to future care or treatment, and that the Practitioner or I may terminate the telemedicine visit at any time. I understand that I have the right to inspect all information obtained and/or recorded in the course of the telemedicine visit and may receive copies of available information for a reasonable fee.  I understand that some of the potential risks of receiving the Services via telemedicine include:  Marland Kitchen Delay or interruption in medical evaluation due to technological equipment failure or disruption; . Information transmitted may not be sufficient (e.g. poor resolution of images) to allow for appropriate medical decision making by the Practitioner;  and/or  . In rare instances, security protocols could fail, causing a breach of personal health information.  Furthermore, I acknowledge that it is my responsibility to provide information about my medical history, conditions and care that is complete and accurate to the best of my ability. I acknowledge that Practitioner's advice, recommendations, and/or decision may be based on factors not within their control, such as incomplete or inaccurate data provided by me or distortions of diagnostic images or specimens that may result from electronic transmissions. I understand that the practice of medicine is not an exact science and that Practitioner makes no warranties or guarantees regarding treatment outcomes. I acknowledge that a copy of this consent can be made available to me via my patient portal ([facility]), or I can request a printed copy by calling the office of [facility].    I understand that my insurance will be billed for this visit.   I have read or had this consent read to me. . I understand the contents of this consent, which adequately explains the benefits and risks of the Services being provided via telemedicine.  . I have been provided ample opportunity to ask questions regarding this consent and the Services and have had my questions answered to my satisfaction. . I give my informed consent for the services to be provided through the use of telemedicine in my medical care

## 2019-12-15 NOTE — Progress Notes (Signed)
Virtual Visit via Telephone Note   This visit type was conducted due to national recommendations for restrictions regarding the COVID-19 Pandemic (e.g. social distancing) in an effort to limit this patient's exposure and mitigate transmission in our community.  Due to his co-morbid illnesses, this patient is at least at moderate risk for complications without adequate follow up.  This format is felt to be most appropriate for this patient at this time.  The patient did not have access to video technology/had technical difficulties with video requiring transitioning to audio format only (telephone).  All issues noted in this document were discussed and addressed.  No physical exam could be performed with this format.  Please refer to the patient's chart for his  consent to telehealth for [facility].    Date:  12/17/2019   ID:  Joseph Bryan, DOB 1940-07-08, MRN [id number] The patient was identified using 2 identifiers.  Patient Location: Home Provider Location: Home Office  PCP:  Leonard Downing, MD  Cardiologist:  Shelva Majestic, MD Electrophysiologist:  None   Evaluation Performed:  Follow-Up Visit  Chief Complaint:  Follow up  History of Present Illness:    Joseph Bryan is a 79 y.o. male with past medical history of HTN, HLD, CAD s/p CABG 1998, RBBB, OSA on CPAP.  Repeat cardiac catheterization in 2010 showed patent grafts.  Most recent Myoview obtained on 08/20/2016 showed EF 36%, medium defect of moderate severity present in the apical anterior, apical septal and apex location, the defect was not reversible and consistent with previous infarct, no ischemia was noted.  Echocardiogram obtained on 10/17/2016 showed EF 45 to 50%, mild AI, moderate TR. patient was last seen by Coletta Memos, NP on 06/20/2019 for preoperative clearance prior to his laparoscopic cholecystectomy.  More recently, patient presented to the ED on 11/06/2019 with 3-day onset of persistent progressive lower  abdominal discomfort associated with nausea and vomiting.  Renal function and white blood cell count was normal.  CT showed moderate volume ascites with abnormal contour of the liver which may suggest cirrhosis.  Hepatitis panel negative.  He underwent paracentesis, pathology reported reactive mesothelial cell suggestive of inflammation.  Patient is scheduled to see Dr. Trilby Leaver as outpatient. Several of his medication has been discontinued since the recent ED visit due to cirrhosis. He is no longer on a statin medication will atenolol either. I recommended decrease aspirin from 325 mg daily to 81 mg daily. 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 He has not had any recurrent ascites or abdominal distention since the last paracentesis. He denies any chest pain or shortness of breath. Blood pressure very well controlled on current therapy. I did recommend a repeat echocardiogram to reassess his ejection fraction to make sure there is no significant change from the cardiac perspective. Otherwise he can follow-up with Dr. Claiborne Billings in 3 months.  The patient does not have symptoms concerning for COVID-19 infection (fever, chills, cough, or new shortness of breath).    Past Medical History:  Diagnosis Date  . 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 Coronary artery disease   . Glaucoma   . Heart disease   . History of kidney stones   . Hyperlipidemia   . Hypertension   . Myocardial infarction ([place]) 17 yrs ago  . Neuromuscular disorder (HCC)    neuropathy  feet  . Neuropathy    feet  . Pneumonia   . Renal insufficiency   . Sleep apnea with use of continuous positive airway pressure (CPAP)    uses CPAP   Past Surgical History:  Procedure Laterality Date  . APPENDECTOMY  2014  . CHOLECYSTECTOMY N/A 06/21/2019   Procedure: LAPAROSCOPIC CHOLECYSTECTOMY WITH INTRAOPERATIVE CHOLANGIOGRAM;   Surgeon: Armandina Gemma, MD;  Location: [facility];  Service: General;  Laterality: N/A;  . COLON RESECTION N/A 08/27/2012   Procedure: Diagnostic laparoscopy; Ileocecectomy;  Surgeon: Madilyn Hook, DO;  Location: [facility];  Service: General;  Laterality: N/A;  . COLONOSCOPY WITH PROPOFOL N/A 04/09/2015   Procedure: COLONOSCOPY WITH PROPOFOL;  Surgeon: Garlan Fair, MD;  Location: [facility];  Service: Endoscopy;  Laterality: N/A;  . CORONARY ARTERY BYPASS GRAFT  1998   x5  . CYSTOSCOPY/URETEROSCOPY/HOLMIUM LASER/STENT PLACEMENT Right 02/25/2018   Procedure: RIGHT URETEROSCOPY/HOLMIUM LASER/ STONE REMOVAL  STENT PLACEMENT;  Surgeon: Ardis Hughs, MD;  Location: [facility];  Service: Urology;  Laterality: Right;  . ERCP N/A 06/22/2019   Procedure: ENDOSCOPIC RETROGRADE CHOLANGIOPANCREATOGRAPHY (ERCP);  Surgeon: Clarene Essex, MD;  Location: [facility];  Service: Endoscopy;  Laterality: N/A;  . EYE SURGERY     cataract and glaucome  . LUMBAR LAMINECTOMY/DECOMPRESSION MICRODISCECTOMY N/A 10/19/2015   Procedure: Lumbar three to four LAMINECTOMY/FORAMINOTOMY;  Surgeon: Erline Levine, MD;  Location: [facility];  Service: Neurosurgery;  Laterality: N/A;  . 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 REMOVAL OF STONES  06/22/2019   Procedure: REMOVAL OF STONES;  Surgeon: Clarene Essex, MD;  Location: [facility];  Service: Endoscopy;;  . Joan Mayans  06/22/2019   Procedure: Joan Mayans;  Surgeon: Clarene Essex, MD;  Location: [facility];  Service: Endoscopy;;     Current Meds  Medication Sig  . Ascorbic Acid (VITAMIN C) 1000 MG tablet Take 1,000 mg by mouth every evening.   Marland Kitchen BEE POLLEN PO Take 3 capsules by mouth every evening.   . famotidine (PEPCID) 20 MG tablet Take 20 mg by mouth every evening.  . pantoprazole (PROTONIX) 40 MG tablet Take 40 mg by mouth daily.  Marland Kitchen spironolactone (ALDACTONE) 50 MG tablet Take 50 mg by mouth daily.  Marland Kitchen torsemide (DEMADEX) 20 MG  tablet Take 1 tablet (20 mg total) by mouth daily. (Patient taking differently: Take 20 mg by mouth every evening. )  . vitamin B-12 (CYANOCOBALAMIN) 100 MCG tablet Take 100 mcg by mouth every evening.   . [DISCONTINUED] aspirin (ECOTRIN) 325 MG EC tablet Take 325 mg by mouth every evening.  . [DISCONTINUED] rosuvastatin (CRESTOR) 20 MG tablet TAKE 1 TABLET BY MOUTH DAILY (Patient taking differently: Take 20 mg by mouth every evening. )     Allergies:   Other   Social History   Tobacco Use  . Smoking status: Former Smoker    Types: Cigarettes    Quit date: 08/25/1988    Years since quitting: 31.3  . Smokeless tobacco: Never Used  Vaping Use  . Vaping Use: Never used  Substance Use Topics  . Alcohol use: No    Alcohol/week: 0.0 standard drinks  . Drug use: No     Family Hx: The patient's family history includes Heart attack in his brother; Hypertension in his father and sister.  ROS:   Please see the history of present illness.     All other systems reviewed and are negative.   Prior CV  studies:   The following studies were reviewed today:  Myoview 08/20/2016  The left ventricular ejection fraction is moderately decreased (30-44%).  Nuclear stress EF: 36%.  There was no ST segment deviation noted during stress.  There is a medium defect of moderate severity present in the apical anterior, apical septal and apex location. The defect is non-reversible. This is consistent with infarct. No ischemia noted.  This is an intermediate risk study.  There is evidence of transient ischemic dilatation with a TID of 1.27 which can be seen in multivessel CAD.    Echo 10/17/2016 LV EF: 45% -  50%  Study Conclusions   - Left ventricle: The cavity size was mildly dilated. There was  mild concentric hypertrophy. Systolic function was mildly  reduced. The estimated ejection fraction was in the range of 45%  to 50%. Diffuse hypokinesis. Doppler parameters are consistent   with abnormal left ventricular relaxation (grade 1 diastolic  dysfunction). Doppler parameters are consistent with  indeterminate ventricular filling pressure.  - Aortic valve: Transvalvular velocity was within the normal range.  There was no stenosis. There was mild regurgitation.  - Mitral valve: Transvalvular velocity was within the normal range.  There was no evidence for stenosis. There was trivial  regurgitation.  - Left atrium: The atrium was mildly to moderately dilated.  - Right ventricle: The cavity size was normal. Wall thickness was  normal. Systolic function was normal.  - Tricuspid valve: There was moderate regurgitation.  - Pulmonary arteries: Systolic pressure was within the normal  range. PA peak pressure: 35 mm Hg (S).     Labs/Other Tests and Data Reviewed:    EKG:  An ECG dated 06/20/2019 was personally reviewed today and demonstrated:  Sinus rhythm, right bundle branch block.  Recent Labs: 11/06/2019: ALT 26; BUN 12; Creatinine, Ser 0.91; Hemoglobin 12.7; Platelets 168; Potassium 4.1; Sodium 134   Recent Lipid Panel Lab Results  Component Value Date/Time   CHOL 144 10/28/2016 11:55 AM   TRIG 87 10/28/2016 11:55 AM   HDL 64 10/28/2016 11:55 AM   CHOLHDL 2.3 10/28/2016 11:55 AM   CHOLHDL 2.1 04/17/2016 11:12 AM   LDLCALC 63 10/28/2016 11:55 AM    Wt Readings from Last 3 Encounters:  12/15/19 261 lb (118.4 kg)  06/21/19 279 lb 6.4 oz (126.7 kg)  06/20/19 279 lb 6.4 oz (126.7 kg)     Risk Assessment/Calculations:      Objective:    Vital Signs:  BP 115/84   Ht 6\' 1"  (1.854 m)   Wt 261 lb (118.4 kg)   BMI 34.43 kg/m    VITAL SIGNS:  reviewed  ASSESSMENT & PLAN:    1. History of ischemic cardiomyopathy: Euvolemic recently without sign of heart failure.  Given the recent onset of ascites, I decided to obtain an echocardiogram to make sure there was no significant change in his ejection fraction.  2. CAD s/p CABG: Denies any chest  pain.  Reduce aspirin from 325 mg daily to 81 mg daily  3. Hypertension: Blood pressure stable  4. Hyperlipidemia: Crestor was recently discontinued after diagnosis of cirrhosis.  5. Cirrhosis with ascites: Recently underwent paracentesis.  Multiple medication has been adjusted recently.   Shared Decision Making/Informed Consent        COVID-19 Education: The signs and symptoms of COVID-19 were discussed with the patient and how to seek care for testing (follow up with PCP or arrange E-visit).  The importance of social distancing was discussed today.  Time:  Today, I have spent 14 minutes with the patient with telehealth technology discussing the above problems.     Medication Adjustments/Labs and Tests Ordered: Current medicines are reviewed at length with the patient today.  Concerns regarding medicines are outlined above.   Tests Ordered: Orders Placed This Encounter  Procedures  . ECHOCARDIOGRAM COMPLETE    Medication Changes: Meds ordered this encounter  Medications  . aspirin EC 81 MG tablet    Sig: Take 1 tablet (81 mg total) by mouth daily. Swallow whole.    Dispense:  90 tablet    Refill:  3    Follow Up:  In Person in 3 month(s)  Signed, Almyra Deforest, [place]  12/17/2019 7:04 PM    [facility]

## 2019-12-15 NOTE — Patient Instructions (Signed)
Medication Instructions:   START Aspirin 81 mg daily  *If you need a refill on your cardiac medications before your next appointment, please call your pharmacy*  Lab Work: NONE ordered at this time of appointment   If you have labs (blood work) drawn today and your tests are completely normal, you will receive your results only by: Marland Kitchen MyChart Message (if you have MyChart) OR . A paper copy in the mail If you have any lab test that is abnormal or we need to change your treatment, we will call you to review the results.  Testing/Procedures: Your physician has requested that you have an echocardiogram. Echocardiography is a painless test that uses sound waves to create images of your heart. It provides your doctor with information about the size and shape of your heart and how well your heart's chambers and valves are working. This procedure takes approximately one hour. There are no restrictions for this procedure.  PLEASE SCHEDULE FOR 3-4 WEEKS    Follow-Up: At [facility], you and your health needs are our priority.  As part of our continuing mission to provide you with exceptional heart care, we have created designated Provider Care Teams.  These Care Teams include your primary Cardiologist (physician) and Advanced Practice Providers (APPs -  Physician Assistants and Nurse Practitioners) who all work together to provide you with the care you need, when you need it.  We recommend signing up for the patient portal called "MyChart".  Sign up information is provided on this After Visit Summary.  MyChart is used to connect with patients for Virtual Visits (Telemedicine).  Patients are able to view lab/test results, encounter notes, upcoming appointments, etc.  Non-urgent messages can be sent to your provider as well.   To learn more about what you can do with MyChart, go to [email or web address].    Your next appointment:   3 month(s)  The format for your next appointment:   In  Person  Provider:   Shelva Majestic, MD  Other Instructions

## 2019-12-15 NOTE — Telephone Encounter (Signed)
Spoke with patient regarding Echo scheduled 01/12/20 at 12:45 pm at [address], [address]---arrival time is 12:30 pm for check in.  Will mail information to patient and he voiced his understanding.

## 2019-12-17 ENCOUNTER — Encounter: Payer: Self-pay | Admitting: Physician Assistant

## 2020-01-12 ENCOUNTER — Ambulatory Visit (HOSPITAL_COMMUNITY): Payer: Medicare Other | Attending: Cardiology

## 2020-01-12 ENCOUNTER — Other Ambulatory Visit: Payer: Self-pay

## 2020-01-12 DIAGNOSIS — I25810 Atherosclerosis of coronary artery bypass graft(s) without angina pectoris: Secondary | ICD-10-CM

## 2020-01-12 DIAGNOSIS — Z8679 Personal history of other diseases of the circulatory system: Secondary | ICD-10-CM | POA: Insufficient documentation

## 2020-01-12 LAB — ECHOCARDIOGRAM COMPLETE: S' Lateral: 4.2 cm

## 2020-01-12 MED ORDER — PERFLUTREN LIPID MICROSPHERE
1.0000 mL | INTRAVENOUS | Status: AC | PRN
  Administered 2020-01-12: 3 mL via INTRAVENOUS

## 2020-01-16 NOTE — Progress Notes (Signed)
No significant change when compare to echo from 2018, pumping function of heart still borderline low, mild mitral and aortic valve disease.

## 2020-04-16 ENCOUNTER — Ambulatory Visit: Payer: Medicare Other | Admitting: Cardiovascular Disease

## 2020-04-16 ENCOUNTER — Other Ambulatory Visit: Payer: Self-pay

## 2020-04-16 ENCOUNTER — Encounter: Payer: Self-pay | Admitting: Cardiovascular Disease

## 2020-04-16 DIAGNOSIS — Z8679 Personal history of other diseases of the circulatory system: Secondary | ICD-10-CM | POA: Diagnosis not present

## 2020-04-16 DIAGNOSIS — K7460 Unspecified cirrhosis of liver: Secondary | ICD-10-CM

## 2020-04-16 DIAGNOSIS — I25810 Atherosclerosis of coronary artery bypass graft(s) without angina pectoris: Secondary | ICD-10-CM

## 2020-04-16 DIAGNOSIS — E785 Hyperlipidemia, unspecified: Secondary | ICD-10-CM

## 2020-04-16 DIAGNOSIS — R600 Localized edema: Secondary | ICD-10-CM

## 2020-04-16 DIAGNOSIS — R188 Other ascites: Secondary | ICD-10-CM

## 2020-04-16 DIAGNOSIS — G4733 Obstructive sleep apnea (adult) (pediatric): Secondary | ICD-10-CM

## 2020-04-16 DIAGNOSIS — Z951 Presence of aortocoronary bypass graft: Secondary | ICD-10-CM

## 2020-04-16 DIAGNOSIS — I10 Essential (primary) hypertension: Secondary | ICD-10-CM | POA: Diagnosis not present

## 2020-04-16 MED ORDER — TORSEMIDE 20 MG PO TABS: 40.0000 mg | ORAL_TABLET | Freq: Every day | ORAL | 0 refills | Status: DC

## 2020-04-16 NOTE — Progress Notes (Signed)
Patient ID: Joseph Bryan, male   DOB: Jan 17, 1940, 80 y.o.   MRN: [id number]     Primary M.D.: Dr. Claris Gower  HPI: Joseph Bryan is a 80 y.o. male who presents to the office today for a 16 month followup cardiology evaluation.  [name] has CAD and suffered an anterior wall myocardial infarction in November 1998. He underwent primary PTCA of the totally occluded proximal LAD done by me. Due to the concomitant high-grade distal left main stenosis with ostial encroachment to a circumflex vessel following stabilization from his initial intervention and significant myocardial salvage he underwent elective CABG surgery (LIMA to LAD, vein to diagonal, vein to third marginal vessel). He did develop postoperative DVT for which he did require Coumadin therapy. His last catheterization in 2010 showed patent grafts. He has a history of hyperlipidemia also peripheral neuropathy. Two years ago he was admitted to [facility] long hospital with abdominal pain and I saw him for preoperative preoperative evaluation.  A preoperative 2-D echo Doppler study showed normal systolic function with mild/moderate tricuspid regurgitation. Mild pulmonary hypertension with a PA pressure 33 mm. He was found to have colonic ischemia with presumed appendix inflammation.  From a cardiac standpoint, he ultimately tolerated his abdominal surgery where he was found to have colonic ischemia and underwent laparoscopic ileo-colectomy by Dr. [name].   A nuclear perfusion which on 11/16/2012 revealed a perfusion defect in the mid to distal anteroapical, apical, inferoapical walls consistent with distal LAD territory scar. Ejection fraction was 54% there was mild anteroapical hypokinesis. This was not significantly changed from his last nuclear study in 2010.  Additional problems include  a history of sleep apnea and uses CPAP therapy 100% of the time. uses [employer] for his DME company.  He admits to 100% compliance.  He denies any  awareness of breakthrough snoring.  He denies any hypersomnolence.  His sleep is restorative.  He had been taking low-dose aspirin 2 tablets daily.  I recommended reduction to 81 mg.  He is unaware of any bleeding.  He does admit to low back discomfort.  He is been found on CT imaging to have bulging disks.  He has seen Dr. [name] for neurosurgical evaluation has undergone epidural injections.  When I saw him in February 2018 his atenolol dose had been reduced by his primary physician from 100 mg down to 50 mg.  He was on atenolol 50 mg and lisinopril 20 mg as well as furosemide 40 mg for hypertension.  He denies any significant edema.  He was tachycardic and I titrated atenolol up to 75 mg.  He continued to be on simvastatin for hyperlipidemia.  He has obstructive sleep apnea and admitted to 100% compliance with CPAP therapy.  Prescription was written for new supplies with his DME company [facility] [employer].   When I saw him in September 2018 at which time he was without chest pain shortness of breath or palpitations.  A nuclear study on August 20, 2016 showed an EF of 36% with moderate scar apically without associated ischemia.  There was a suggestion of possible mild transient ischemic dilation.  He continued to be asymptomatic.  He underwent low back surgery with Dr. [name].  He was placed on oxycodone for pain discomfort following surgery.  He has not liked the mental aspects of this drug.  He tells me he did on one day take his wife's gabapentin and noticed improvement in his peripheral neuropathy and leg discomfort and was  questioning about possibly using this instead.  He admits to significant lower extremity edema bilaterally.  He denies recurrent chest pain PND orthopnea.  He continues to use CPAP with 100% compliance.  When I saw him in March 2019 he had 3+ tense lower extremity edema and his blood pressure was mildly increased.  He was taking atenolol 75 mg daily, furosemide 40 mg in the morning  and 20 mg in the late afternoon in addition to lisinopril 30 mg daily.  I discontinued furosemide and changed him to torsemide 40 mg twice a day for several days but then dose reduction to 40 mg in the morning and 20 mg in the late afternoon but he could increase this back to 40 twice daily with recurrent edema.  He has only noticed minimal benefit with the leg swelling.  Typically in the morning he hardly has any edema but as the day progresses his edema significantly increases.  He denies any chest pain.  He continues to use CPAP with 100% compliance.    At F/U in May 2019 he continued to have at least 2+ tense edema as the day progressed.  I recommended metolazone 2.5 mg 30 minutes prior to his morning dose of torsemide for 2 days and then recommended changing this to every other day and then every third day depending upon his leg edema.  This did improve his edema but apparently his blood pressure decreased and he stopped taking this after only several doses.  He had lost 11 pounds since that evaluation.    When I saw him in June 2019 he again experienced significant leg swelling.  I recommended support stockings with at least 20-30 mm pressure support.  I suggested a slight rechallenge of metolazone and recommended 2.5 mg once a week 30 minutes before the morning torsemide dose and at that time he was to decrease lisinopril to 20 mg prior to reinitiating weekly metolazone.  Renal function was stable with a creatinine of 1.01.    I saw him in August 2019 at which time he had noticed some improvement with metolazone.  At time I increased this to 2 days/week and again recommended compression stockings.  At follow-up in November 2019 he felt well.  He states his blood pressure at home typically is run 120/70.  His leg swelling has improved.  He now sees Dr. Claris Gower for his primary care.  He did not take his torsemide nor his metolazone today and he does have leg swelling today.  He did see Nehemiah Massed in September 2019.   He admits to 100% CPAP use.  He denies any chest pain PND orthopnea or arrhythmias.    He was  evaluated in a telemedicine visit on June 16, 2018.  Since his prior evaluation he denied chest pain or shortness of breath.  He was taking metolazone 2.5 mg on Tuesday and Friday and takes torsemide 20 mg 2 times a day.  He had been using CPAP with 100% compliance but  his machine completely malfunctioned and stopped working.  As result he has not been able to use CPAP.  He has not been sleeping well since and is in need for new machine.  His sleep study was in 2010.  Presently, he admits to nocturia 3-4 times per night.  He continues to experience significant lower extremity edema.  He presents for reevaluation.  He was last evaluated by me in December 2020.  At that time he continued to have blood pressure  elevation and significant bilateral lower extremity edema despite taking torsemide 40 mg every morning, metolazone 2.5 mg twice per week in addition to atenolol 75 mg.  I recommended additional titration of metolazone to every other day until there was significant resolution of edema on else.  I also recommended he reduce his aspirin to 81 mg.  He had been on CPAP therapy, but his machine was no longer functional.  Since over 10 years have elapsed since his initial study with significant weight changes I recommended reevaluation with a split-night study.  It appears that this was never pursued.  He was last evaluated in a telemedicine visit on December 15, 2019 by Almyra Deforest.  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  He was referred for an echo Doppler study on January 12, 2020 which showed an EF of 45 to 50% not significantly changed from his echo in 2018.  There was mild mitral regurgitation, and mild aortic sclerosis.  Presently, he is now on atenolol 75 5 mg daily, spironolactone 50 mg daily, and has been taking torsemide on a as needed basis.   Apparently his rosuvastatin had been stopped but he was placed back on it and is still currently taking 20 mg.  He continues to experience leg swelling.  He has a history of prior left nephrectomy.  He presents for evaluation  Past Medical History:  Diagnosis Date  . 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 Coronary artery disease   . Glaucoma   . Heart disease   . History of kidney stones   . Hyperlipidemia   . Hypertension   . Myocardial infarction ([place]) 17 yrs ago  . Neuromuscular disorder (HCC)    neuropathy feet  . Neuropathy    feet  . Pneumonia   . Renal insufficiency   . Sleep apnea with use of continuous positive airway pressure (CPAP)    uses CPAP    Past Surgical History:  Procedure Laterality Date  . APPENDECTOMY  2014  . CHOLECYSTECTOMY N/A 06/21/2019   Procedure: LAPAROSCOPIC CHOLECYSTECTOMY WITH INTRAOPERATIVE CHOLANGIOGRAM;  Surgeon: Armandina Gemma, MD;  Location: [facility];  Service: General;  Laterality: N/A;  . COLON RESECTION N/A 08/27/2012   Procedure: Diagnostic laparoscopy; Ileocecectomy;  Surgeon: Madilyn Hook, DO;  Location: [facility];  Service: General;  Laterality: N/A;  . COLONOSCOPY WITH PROPOFOL N/A 04/09/2015   Procedure: COLONOSCOPY WITH PROPOFOL;  Surgeon: Garlan Fair, MD;  Location: [facility];  Service: Endoscopy;  Laterality: N/A;  . CORONARY ARTERY BYPASS GRAFT  1998   x5  . CYSTOSCOPY/URETEROSCOPY/HOLMIUM LASER/STENT PLACEMENT Right 02/25/2018   Procedure: RIGHT URETEROSCOPY/HOLMIUM LASER/ STONE REMOVAL  STENT PLACEMENT;  Surgeon: Ardis Hughs, MD;  Location: [facility];  Service: Urology;  Laterality: Right;  . ERCP N/A 06/22/2019   Procedure: ENDOSCOPIC RETROGRADE CHOLANGIOPANCREATOGRAPHY (ERCP);  Surgeon: Clarene Essex, MD;  Location: [facility];  Service: Endoscopy;  Laterality: N/A;  . EYE SURGERY     cataract and glaucome  . LUMBAR LAMINECTOMY/DECOMPRESSION  MICRODISCECTOMY N/A 10/19/2015   Procedure: Lumbar three to four LAMINECTOMY/FORAMINOTOMY;  Surgeon: Erline Levine, MD;  Location: [facility];  Service: Neurosurgery;  Laterality: N/A;  . 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 REMOVAL OF STONES  06/22/2019   Procedure: REMOVAL OF STONES;  Surgeon: Watt Climes,  Altamese Dilling, MD;  Location: [facility];  Service: Endoscopy;;  . Joan Mayans  06/22/2019   Procedure: Joan Mayans;  Surgeon: Clarene Essex, MD;  Location: [facility];  Service: Endoscopy;;    Allergies  Allergen Reactions  . Other Rash and Other (See Comments)    CHG soap and wipes patient states today(06/21/19), he does not recall this allergy. He completed the pre-op chg baths without any problems.    Current Outpatient Medications  Medication Sig Dispense Refill  . Ascorbic Acid (VITAMIN C) 1000 MG tablet Take 1,000 mg by mouth every evening.     Marland Kitchen aspirin 325 MG tablet Take 325 mg by mouth daily.    Marland Kitchen atenolol (TENORMIN) 50 MG tablet Take 75 mg by mouth daily.    Marland Kitchen BEE POLLEN PO Take 3 capsules by mouth every evening.     . pantoprazole (PROTONIX) 40 MG tablet Take 1 tablet by mouth daily.    . rosuvastatin (CRESTOR) 20 MG tablet Take 20 mg by mouth daily.    Marland Kitchen spironolactone (ALDACTONE) 50 MG tablet Take 50 mg by mouth daily.    . vitamin B-12 (CYANOCOBALAMIN) 100 MCG tablet Take 100 mcg by mouth every evening.     Marland Kitchen aspirin EC 81 MG tablet Take 1 tablet (81 mg total) by mouth daily. Swallow whole. (Patient not taking: Reported on 04/16/2020) 90 tablet 3  . pyridOXINE (VITAMIN B-6) 100 MG tablet Take 100 mg by mouth every evening.  (Patient not taking: No sig reported)    . torsemide (DEMADEX) 20 MG tablet Take 2 tablets (40 mg total) by mouth daily. 180 tablet 0   No current facility-administered medications for this visit.    Social History   Socioeconomic History  . Marital status: Married    Spouse name: Not on file  . Number of children:  Not on file  . Years of education: Not on file  . Highest education level: Not on file  Occupational History  . Not on file  Tobacco Use  . Smoking status: Former Smoker    Types: Cigarettes    Quit date: 08/25/1988    Years since quitting: 31.6  . Smokeless tobacco: Never Used  Vaping Use  . Vaping Use: Never used  Substance and Sexual Activity  . Alcohol use: No    Alcohol/week: 0.0 standard drinks  . Drug use: No  . Sexual activity: Not Currently  Other Topics Concern  . Not on file  Social History Narrative  . Not on file   Social Determinants of Health   Financial Resource Strain: Not on file  Food Insecurity: Not on file  Transportation Needs: Not on file  Physical Activity: Not on file  Stress: Not on file  Social Connections: Not on file  Intimate Partner Violence: Not on file    Family History  Problem Relation Age of Onset  . Hypertension Father   . Hypertension Sister   . Heart attack Brother    He is married has 3 children 4 grandchildren 2 great-grandchildren. There is remote tobacco history.  ROS General: Negative; No fevers, chills, or night sweats;  HEENT:  positive for cataract/glaucoma surgery, no sinus congestion, difficulty swallowing Pulmonary: Negative; No cough, wheezing, shortness of breath, hemoptysis Cardiovascular: Negative; No chest pain, presyncope, syncope, palpitations Positive for leg swelling GI: Negative; No nausea, vomiting, diarrhea, or abdominal pain GU: Negative; No dysuria, hematuria, or difficulty voiding Musculoskeletal: Positive for low back pain and herniated disks; he walks with a cane status post recent  surgery on L3-4 and L4-5;  Hematologic/Oncology: Negative; no easy bruising, bleeding Endocrine: Negative; no heat/cold intolerance; no diabetes Neuro: Negative; no changes in balance, headaches Skin: Negative; No rashes or skin lesions Psychiatric: Negative; No behavioral problems, depression Sleep: Positive for  obstructive sleep apnea on CPAP therapy since 2010.  Machine malfunction, with breakthrough snoring, nonrestorative sleep, daytime somnolence.  No bruxism, restless legs, hypnogognic hallucinations, no cataplexy Other comprehensive 14 point system review is negative.   PE BP 124/62 (BP Location: Left Arm, Patient Position: Sitting)   Pulse 63   Ht 6' 1" (1.854 m)   Wt 256 lb 12.8 oz (116.5 kg)   SpO2 98%   BMI 33.88 kg/m    Repeat blood pressure by me 124/62  Wt Readings from Last 3 Encounters:  04/16/20 256 lb 12.8 oz (116.5 kg)  12/15/19 261 lb (118.4 kg)  06/21/19 279 lb 6.4 oz (126.7 kg)   General: Alert, oriented, no distress.  Skin: normal turgor, no rashes, warm and dry HEENT: Normocephalic, atraumatic. Pupils equal round and reactive to light; sclera anicteric; extraocular muscles intact;  Nose without nasal septal hypertrophy Mouth/Parynx benign; Mallinpatti scale 3 Neck: No JVD, no carotid bruits; normal carotid upstroke Lungs: clear to ausculatation and percussion; no wheezing or rales Chest wall: without tenderness to palpitation Heart: PMI not displaced, RRR, s1 s2 normal, 1/6 systolic murmur, no diastolic murmur, no rubs, gallops, thrills, or heaves Abdomen: soft, nontender; no hepatosplenomehaly, BS+; abdominal aorta nontender and not dilated by palpation. Back: no CVA tenderness Pulses 2+ Musculoskeletal: full range of motion, normal strength, no joint deformities Extremities:3 + LE edema no clubbing cyanosis  Homan's sign negative  Neurologic: grossly nonfocal; Cranial nerves grossly wnl Psychologic: Normal mood and affect   ECG (independently read by me): Sinus rhythm at 63; PVC, PAD; RBBB  December 2020 ECG (independently read by me): Normal sinus rhythm at 62 bpm, right bundle branch block with left anterior hemiblock.  Probable LVH.  QTc interval 462 ms  November 2019 ECG (independently read by me): Probable low atrial rhythm with inverted P waves in  lead III and aVF.  Right bundle branch block with repolarization.  August 2019 ECG (independently read by me):  Sinus bradycardia at 55; RBBB, LAHB, normal intervals  June 2019 ECG (independently read by me): Normal sinus rhythm at 60 bpm.  Right bundle branch block with repolarization changes.  March 2019 ECG (independently read by me): sinus rhythm at 86 bpm with occasional PACs. 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  QTc interval 473 ms  September 2018 ECG (independently read by me): Normal sinus rhythm at 65 bpm.  Right bundle-branch block with repolarization changes.  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  LVH by voltage.  Normal intervals.  No ectopy.  February 2018 ECG (independently read by me): Sinus tachycardia at 102 bpm, right bundle branch block, left anterior hemiblock.  LVH.  February 2017 ECG (independently read by me): Normal sinus rhythm at 63 bpm.  Right bundle-branch block with repolarization changes.  Left axis deviation.  Normal intervals.  December 2015 ECG (independently read by me): Sinus bradycardia 56 bpm.  Right bundle branch block with repolarization changes.  Normal intervals.  December 2014 ECG: Sinus rhythm at 67 beats per minute with right bundle branch block and nonspecific ST-T changes. Qtc 464 msec  LABS: BMP Latest Ref Rng & Units 04/16/2020 11/06/2019 06/23/2019  Glucose 65 - 99 mg/dL 89 98 113(H)  BUN 8 -  27 mg/dL _0 Creatinine 0.76 - 1.27 mg/dL 1.16 0.91 0.77  BUN/Creat Ratio 10 - 24 12 - -  Sodium 134 - 144 mmol/L 135 134(L) 135  Potassium 3.5 - 5.2 mmol/L 4.0 4.1 3.8  Chloride 96 - 106 mmol/L 97 98 95(L)  CO2 20 - 29 mmol/L _1 Calcium 8.6 - 10.2 mg/dL 8.9 8.5(L) 8.3(L)   Hepatic Function Latest Ref Rng & Units 04/16/2020 11/06/2019 06/23/2019  Total Protein 6.0 - 8.5 g/dL 8.4 6.9 6.5  Albumin 3.7 - 4.7 g/dL 2.7(L) 2.6(L) 2.6(L)  AST 0 - 40 IU/L 47(H) 49(H) 40  ALT 0 - 44  IU/L _2 Alk Phosphatase 44 - 121 IU/L 178(H) 128(H) 128(H)  Total Bilirubin 0.0 - 1.2 mg/dL 1.0 1.8(H) 0.8  Bilirubin, Direct <=0.2 mg/dL - - -   CBC Latest Ref Rng & Units 04/16/2020 11/06/2019 06/23/2019  WBC 3.4 - 10.8 x10E3/uL 5.7 6.1 10.9(H)  Hemoglobin 13.0 - 17.7 g/dL 12.4(L) 12.7(L) 10.0(L)  Hematocrit 37.5 - 51.0 % 38.0 40.4 32.5(L)  Platelets 150 - 450 x10E3/uL 175 168 130(L)   Lab Results  Component Value Date   TSH 3.270 04/16/2020   Lab Results  Component Value Date   MCV 88 04/16/2020   MCV 93.1 11/06/2019   MCV 94.5 06/23/2019   Lipid Panel     Component Value Date/Time   CHOL 144 10/28/2016 1155   TRIG 87 10/28/2016 1155   HDL 64 10/28/2016 1155   CHOLHDL 2.3 10/28/2016 1155   CHOLHDL 2.1 04/17/2016 1112   VLDL 18 04/17/2016 1112   LDLCALC 63 10/28/2016 1155     RADIOLOGY: No results found.  IMPRESSION:  1. Coronary artery disease involving coronary bypass graft of native heart without angina pectoris   2. Hx of CABG   3. History of cardiomyopathy   4. Essential hypertension   5. Bilateral leg edema   6. Hyperlipidemia LDL goal <70   7. OSA (obstructive sleep apnea)   8. Cirrhosis of liver with ascites, unspecified hepatic cirrhosis type [facility])      ASSESSMENT AND PLAN: [name] is a 80 year-old white male who is 24 years status post an anterior wall myocardial infarction treated with PTCA of a totally occluded LAD in 1998. Due to severe  left main stenosis he underwent CABG surgery with a LIMA to his LAD, vein to diagonal, and vein to the third marginal vessel. He has been documented have significant salvage of myocardium from his acute PTCA. An echo Doppler study done preoperatively showed a normal  ejection fraction with mild to moderate TR.  A nuclear perfusion study in 2014 showed distal LAD territory scar concordant with his prior myocardial infarction.   His last nuclear study revealed an EF of 36% with scar and raised the possibility of  mild transient ischemic dilation. An echo Doppler study October 17, 2016 however showed an EF of 45-50% and there was diffuse hypokinesis with grade 1 diastolic dysfunction.  There was mild aortic insufficiency.  His left atrium was mild to moderately dilated.  There was moderate tricuspid regurgitation.  Peak PA pressure was mildly elevated at 35 mm presently.  When I last saw him in the office in December 2020 he had significant lower extremity edema at that time recommended slight adjustment to his metolazone.  Most recently, he has been taking atenolol 75 mg daily, torsemide only 20 mg a day, spironolactone 50 mg daily, and is no longer taking  metolazone.  I have recommended he increase torsemide to 40 mg daily and also have recommended support stockings of 20 to 30 mm.  He has a history of prior left nephrectomy.  He has not had recent laboratory and I am recommending we check a comprehensive metabolic panel, CBC, TSH, magnesium level and BNP.  He has continued to take rosuvastatin 20 mg.  He has a documentation of probable nonalcoholic cirrhosis of his liver and is followed by Dr. Trilby Leaver.  He has a history of obstructive sleep apnea but his machine is no longer functional and in the past we had tried to arrange him to undergo follow-up assessment.  I reviewed his most recent echo Doppler study from December 2021 which is not significantly changed from his prior evaluation in 2018 and continued to show mild LV dysfunction with EF of 45 to 50%, and mild mitral and aortic valve disease I will contact him regarding his laboratory and see him in 2 months for reevaluation  Troy Sine, MD, [facility]  04/19/2020 4:16 PM

## 2020-04-16 NOTE — Patient Instructions (Addendum)
Medication Instructions:  Take Torsemide to 40 mg daily   *If you need a refill on your cardiac medications before your next appointment, please call your pharmacy*   Lab Work: CMET, CBC, TSH, MAG, BNP today  If you have labs (blood work) drawn today and your tests are completely normal, you will receive your results only by: Marland Kitchen MyChart Message (if you have MyChart) OR . A paper copy in the mail If you have any lab test that is abnormal or we need to change your treatment, we will call you to review the results.   Follow-Up: At [facility], you and your health needs are our priority.  As part of our continuing mission to provide you with exceptional heart care, we have created designated Provider Care Teams.  These Care Teams include your primary Cardiologist (physician) and Advanced Practice Providers (APPs -  Physician Assistants and Nurse Practitioners) who all work together to provide you with the care you need, when you need it.  We recommend signing up for the patient portal called "MyChart".  Sign up information is provided on this After Visit Summary.  MyChart is used to connect with patients for Virtual Visits (Telemedicine).  Patients are able to view lab/test results, encounter notes, upcoming appointments, etc.  Non-urgent messages can be sent to your provider as well.   To learn more about what you can do with MyChart, go to [email or web address].    Your next appointment:   2 month(s)  The format for your next appointment:   In Person  Provider:   Shelva Majestic, MD

## 2020-04-17 LAB — COMPREHENSIVE METABOLIC PANEL
ALT: 18 IU/L (ref 0–44)
AST: 47 IU/L — ABNORMAL HIGH (ref 0–40)
Albumin/Globulin Ratio: 0.5 — ABNORMAL LOW (ref 1.2–2.2)
Albumin: 2.7 g/dL — ABNORMAL LOW (ref 3.7–4.7)
Alkaline Phosphatase: 178 IU/L — ABNORMAL HIGH (ref 44–121)
BUN/Creatinine Ratio: 12 (ref 10–24)
BUN: 14 mg/dL (ref 8–27)
Bilirubin Total: 1 mg/dL (ref 0.0–1.2)
CO2: 23 mmol/L (ref 20–29)
Calcium: 8.9 mg/dL (ref 8.6–10.2)
Chloride: 97 mmol/L (ref 96–106)
Creatinine, Ser: 1.16 mg/dL (ref 0.76–1.27)
Globulin, Total: 5.7 g/dL — ABNORMAL HIGH (ref 1.5–4.5)
Glucose: 89 mg/dL (ref 65–99)
Potassium: 4 mmol/L (ref 3.5–5.2)
Sodium: 135 mmol/L (ref 134–144)
Total Protein: 8.4 g/dL (ref 6.0–8.5)
eGFR: 64 mL/min/{1.73_m2} (ref >59)

## 2020-04-17 LAB — CBC
Hematocrit: 38 % (ref 37.5–51.0)
Hemoglobin: 12.4 g/dL — ABNORMAL LOW (ref 13.0–17.7)
MCH: 28.7 pg (ref 26.6–33.0)
MCHC: 32.6 g/dL (ref 31.5–35.7)
MCV: 88 fL (ref 79–97)
Platelets: 175 10*3/uL (ref 150–450)
RBC: 4.32 x10E6/uL (ref 4.14–5.80)
RDW: 14.4 % (ref 11.6–15.4)
WBC: 5.7 10*3/uL (ref 3.4–10.8)

## 2020-04-17 LAB — BRAIN NATRIURETIC PEPTIDE: BNP: 394.2 pg/mL — ABNORMAL HIGH (ref 0.0–100.0)

## 2020-04-17 LAB — MAGNESIUM: Magnesium: 1.9 mg/dL (ref 1.6–2.3)

## 2020-04-17 LAB — TSH: TSH: 3.27 u[IU]/mL (ref 0.450–4.500)

## 2020-04-19 ENCOUNTER — Encounter: Payer: Self-pay | Admitting: Cardiovascular Disease

## 2020-05-01 ENCOUNTER — Telehealth: Payer: Self-pay | Admitting: Cardiovascular Disease

## 2020-05-01 NOTE — Telephone Encounter (Signed)
Patient's wife calling for lab results. 

## 2020-05-01 NOTE — Telephone Encounter (Signed)
Patient called w/results °

## 2020-05-05 ENCOUNTER — Other Ambulatory Visit: Payer: Self-pay | Admitting: Cardiovascular Disease

## 2020-05-15 ENCOUNTER — Other Ambulatory Visit: Payer: Self-pay | Admitting: Cardiovascular Disease

## 2020-05-16 ENCOUNTER — Other Ambulatory Visit: Payer: Self-pay | Admitting: Physician Assistant

## 2020-05-16 DIAGNOSIS — K7460 Unspecified cirrhosis of liver: Secondary | ICD-10-CM

## 2020-06-21 ENCOUNTER — Ambulatory Visit
Admission: RE | Admit: 2020-06-21 | Discharge: 2020-06-21 | Disposition: A | Discharge status: Home / Self Care | Payer: Medicare Other | Source: Ambulatory Visit | Attending: Physician Assistant | Admitting: Physician Assistant

## 2020-06-21 DIAGNOSIS — K7460 Unspecified cirrhosis of liver: Secondary | ICD-10-CM

## 2020-08-06 ENCOUNTER — Ambulatory Visit: Payer: Medicare Other | Admitting: Cardiovascular Disease

## 2020-08-07 ENCOUNTER — Encounter: Payer: Self-pay | Admitting: Cardiovascular Disease

## 2020-08-07 ENCOUNTER — Other Ambulatory Visit: Payer: Self-pay

## 2020-08-07 ENCOUNTER — Ambulatory Visit: Payer: Medicare Other | Admitting: Cardiovascular Disease

## 2020-08-07 DIAGNOSIS — I25810 Atherosclerosis of coronary artery bypass graft(s) without angina pectoris: Secondary | ICD-10-CM

## 2020-08-07 DIAGNOSIS — G4733 Obstructive sleep apnea (adult) (pediatric): Secondary | ICD-10-CM

## 2020-08-07 DIAGNOSIS — R600 Localized edema: Secondary | ICD-10-CM | POA: Diagnosis not present

## 2020-08-07 DIAGNOSIS — Z951 Presence of aortocoronary bypass graft: Secondary | ICD-10-CM

## 2020-08-07 DIAGNOSIS — E785 Hyperlipidemia, unspecified: Secondary | ICD-10-CM | POA: Diagnosis not present

## 2020-08-07 DIAGNOSIS — Z79899 Other long term (current) drug therapy: Secondary | ICD-10-CM

## 2020-08-07 DIAGNOSIS — Z8679 Personal history of other diseases of the circulatory system: Secondary | ICD-10-CM

## 2020-08-07 MED ORDER — ATENOLOL 50 MG PO TABS: 75.0000 mg | ORAL_TABLET | Freq: Every day | ORAL | 3 refills | Status: DC

## 2020-08-07 MED ORDER — TORSEMIDE 20 MG PO TABS: ORAL_TABLET | ORAL | 3 refills | Status: DC

## 2020-08-07 MED ORDER — ROSUVASTATIN CALCIUM 20 MG PO TABS: 20.0000 mg | ORAL_TABLET | Freq: Every day | ORAL | 3 refills | Status: DC

## 2020-08-07 NOTE — Patient Instructions (Addendum)
Medication Instructions:  INCREASE torsemide (Demadex) to '20mg'$  (1 tablet) in the morning, and '20mg'$  (1 tablet) in the afternoon around 3pm-4pm.   Refilled rosuvastatin and atenolol.   Per Dr. Claiborne Billings, take '81mg'$  of aspirin daily.   *If you need a refill on your cardiac medications before your next appointment, please call your pharmacy*   Lab Work: BMET in 2 weeks.   If you have labs (blood work) drawn today and your tests are completely normal, you will receive your results only by: [place] (if you have MyChart) OR A paper copy in the mail If you have any lab test that is abnormal or we need to change your treatment, we will call you to review the results.   Testing/Procedures: None ordered.    Follow-Up: At [facility], you and your health needs are our priority.  As part of our continuing mission to provide you with exceptional heart care, we have created designated Provider Care Teams.  These Care Teams include your primary Cardiologist (physician) and Advanced Practice Providers (APPs -  Physician Assistants and Nurse Practitioners) who all work together to provide you with the care you need, when you need it.  We recommend signing up for the patient portal called "MyChart".  Sign up information is provided on this After Visit Summary.  MyChart is used to connect with patients for Virtual Visits (Telemedicine).  Patients are able to view lab/test results, encounter notes, upcoming appointments, etc.  Non-urgent messages can be sent to your provider as well.   To learn more about what you can do with MyChart, go to [email or web address].    Your next appointment:   4 month(s)  The format for your next appointment:   In Person  Provider:   Shelva Majestic, MD

## 2020-08-07 NOTE — Progress Notes (Signed)
Patient ID: Joseph Bryan, male   DOB: [birth year], 80 y.o.   MRN: [id number]     Primary M.D.: Dr. Claris Gower  HPI: Joseph Bryan is a 80 y.o. male who presents to the office today for a 3 month followup cardiology evaluation.  Joseph Bryan has CAD and suffered an anterior wall myocardial infarction in November 1998. He underwent primary PTCA of the totally occluded proximal LAD done by me. Due to the concomitant high-grade distal left main stenosis with ostial encroachment to a circumflex vessel following stabilization from his initial intervention and significant myocardial salvage he underwent elective CABG surgery (LIMA to LAD, vein to diagonal, vein to third marginal vessel). He did develop postoperative DVT for which he did require Coumadin therapy. His last catheterization in 2010 showed patent grafts. He has a history of hyperlipidemia also peripheral neuropathy. Two years ago he was admitted to [facility] long hospital with abdominal pain and I saw him for preoperative preoperative evaluation.  A preoperative 2-D echo Doppler study showed normal systolic function with mild/moderate tricuspid regurgitation. Mild pulmonary hypertension with a PA pressure 33 mm. He was found to have colonic ischemia with presumed appendix inflammation.  From a cardiac standpoint, he ultimately tolerated his abdominal surgery where he was found to have colonic ischemia and underwent laparoscopic ileo-colectomy by Dr. [name].   A nuclear perfusion which on 11/16/2012 revealed a perfusion defect in the mid to distal anteroapical, apical, inferoapical walls consistent with distal LAD territory scar. Ejection fraction was 54% there was mild anteroapical hypokinesis. This was not significantly changed from his last nuclear study in 2010.  Additional problems include  a history of sleep apnea and uses CPAP therapy 100% of the time. uses [employer] for his DME company.  He admits to 100% compliance.  He denies any  awareness of breakthrough snoring.  He denies any hypersomnolence.  His sleep is restorative.  He had been taking low-dose aspirin 2 tablets daily.  I recommended reduction to 81 mg.  He is unaware of any bleeding.  He does admit to low back discomfort.  He is been found on CT imaging to have bulging disks.  He has seen Dr. [name] for neurosurgical evaluation has undergone epidural injections.  When I saw him in February 2018 his atenolol dose had been reduced by his primary physician from 100 mg down to 50 mg.  He was on atenolol 50 mg and lisinopril 20 mg as well as furosemide 40 mg for hypertension.  He denies any significant edema.  He was tachycardic and I titrated atenolol up to 75 mg.  He continued to be on simvastatin for hyperlipidemia.  He has obstructive sleep apnea and admitted to 100% compliance with CPAP therapy.  Prescription was written for new supplies with his DME company [facility] [employer].   When I saw him in September 2018 at which time he was without chest pain shortness of breath or palpitations.  A nuclear study on August 20, 2016 showed an EF of 36% with moderate scar apically without associated ischemia.  There was a suggestion of possible mild transient ischemic dilation.  He continued to be asymptomatic.  He underwent low back surgery with Dr. [name].  He was placed on oxycodone for pain discomfort following surgery.  He has not liked the mental aspects of this drug.  He tells me he did on one day take his wife's gabapentin and noticed improvement in his peripheral neuropathy and leg discomfort and was  questioning about possibly using this instead.  He admits to significant lower extremity edema bilaterally.  He denies recurrent chest pain PND orthopnea.  He continues to use CPAP with 100% compliance.  When I saw him in March 2019 he had 3+ tense lower extremity edema and his blood pressure was mildly increased.  He was taking atenolol 75 mg daily, furosemide 40 mg in the morning  and 20 mg in the late afternoon in addition to lisinopril 30 mg daily.  I discontinued furosemide and changed him to torsemide 40 mg twice a day for several days but then dose reduction to 40 mg in the morning and 20 mg in the late afternoon but he could increase this back to 40 twice daily with recurrent edema.  He has only noticed minimal benefit with the leg swelling.  Typically in the morning he hardly has any edema but as the day progresses his edema significantly increases.  He denies any chest pain.  He continues to use CPAP with 100% compliance.    At F/U in May 2019 he continued to have at least 2+ tense edema as the day progressed.  I recommended metolazone 2.5 mg 30 minutes prior to his morning dose of torsemide for 2 days and then recommended changing this to every other day and then every third day depending upon his leg edema.  This did improve his edema but apparently his blood pressure decreased and he stopped taking this after only several doses.  He had lost 11 pounds since that evaluation.    When I saw him in June 2019 he again experienced significant leg swelling.  I recommended support stockings with at least 20-30 mm pressure support.  I suggested a slight rechallenge of metolazone and recommended 2.5 mg once a week 30 minutes before the morning torsemide dose and at that time he was to decrease lisinopril to 20 mg prior to reinitiating weekly metolazone.  Renal function was stable with a creatinine of 1.01.    I saw him in August 2019 at which time he had noticed some improvement with metolazone.  At time I increased this to 2 days/week and again recommended compression stockings.  At follow-up in November 2019 he felt well.  He states his blood pressure at home typically is run 120/70.  His leg swelling has improved.  He now sees Dr. Claris Gower for his primary care.  He did not take his torsemide nor his metolazone today and he does have leg swelling today.  He did see Nehemiah Massed in September 2019.   He admits to 100% CPAP use.  He denies any chest pain PND orthopnea or arrhythmias.    He was  evaluated in a telemedicine visit on June 16, 2018.  Since his prior evaluation he denied chest pain or shortness of breath.  He was taking metolazone 2.5 mg on Tuesday and Friday and takes torsemide 20 mg 2 times a day.  He had been using CPAP with 100% compliance but  his machine completely malfunctioned and stopped working.  As result he has not been able to use CPAP.  He has not been sleeping well since and is in need for new machine.  His sleep study was in 2010.  Presently, he admits to nocturia 3-4 times per night.  He continues to experience significant lower extremity edema.  He presents for reevaluation.  He was last evaluated by me in December 2020.  At that time he continued to have blood pressure  elevation and significant bilateral lower extremity edema despite taking torsemide 40 mg every morning, metolazone 2.5 mg twice per week in addition to atenolol 75 mg.  I recommended additional titration of metolazone to every other day until there was significant resolution of edema on else.  I also recommended he reduce his aspirin to 81 mg.  He had been on CPAP therapy, but his machine was no longer functional.  Since over 10 years have elapsed since his initial study with significant weight changes I recommended reevaluation with a split-night study.  It appears that this was never pursued.  He was evaluated in a telemedicine visit on December 15, 2019 by Almyra Deforest.  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  He was referred for an echo Doppler study on January 12, 2020 which showed an EF of 45 to 50% not significantly changed from his echo in 2018.  There was mild mitral regurgitation, and mild aortic sclerosis. When I last saw him on April 16, 2020 he was on atenolol 75  mg daily, spironolactone 50 mg daily, and has been taking torsemide on a as  needed basis.  Apparently his rosuvastatin had been stopped but he was placed back on it and is still currently taking 20 mg.  He continued to experience leg swelling.  He has a history of prior left nephrectomy.  During that evaluation his echocardiographic findings which was not significantly changed from his prior evaluation in 2018.  Laboratory was obtained which showed a BNP of 394, BUN 14 creatinine 1.16.  There was minimal AST increased at 47 with a normal ALT at 18.  Hemoglobin hematocrit were stable and TSH was 3.27.  Presently, he has continued to experience bilateral leg swelling despite taking spironolactone 50 mg and torsemide 20 mg in the morning.  He continues to be on atenolol 75 mg daily.  He denies any chest pain or significant dyspnea.  Apparently he has been taking a full dose Ecotrin rather than 81 mg.  He continues to be on rosuvastatin 20 mg daily.  Dr. Arelia Sneddon has been checking laboratory.   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   Coronary artery disease    Glaucoma    Heart disease    History of kidney stones    Hyperlipidemia    Hypertension    Myocardial infarction ([place]) 17 yrs ago   Neuromuscular disorder (HCC)    neuropathy feet   Neuropathy    feet   Pneumonia    Renal insufficiency    Sleep apnea with use of continuous positive airway pressure (CPAP)    uses CPAP    Past Surgical History:  Procedure Laterality Date   APPENDECTOMY  2014   CHOLECYSTECTOMY N/A 06/21/2019   Procedure: LAPAROSCOPIC CHOLECYSTECTOMY WITH INTRAOPERATIVE CHOLANGIOGRAM;  Surgeon: Armandina Gemma, MD;  Location: [facility];  Service: General;  Laterality: N/A;   COLON RESECTION N/A 08/27/2012   Procedure: Diagnostic laparoscopy; Ileocecectomy;  Surgeon: Madilyn Hook, DO;  Location: [facility];  Service: General;  Laterality: N/A;   COLONOSCOPY WITH PROPOFOL N/A 04/09/2015    Procedure: COLONOSCOPY WITH PROPOFOL;  Surgeon: Garlan Fair, MD;  Location: [facility];  Service: Endoscopy;  Laterality: N/A;   CORONARY ARTERY BYPASS GRAFT  1998   x5   CYSTOSCOPY/URETEROSCOPY/HOLMIUM LASER/STENT PLACEMENT Right 02/25/2018   Procedure:  RIGHT URETEROSCOPY/HOLMIUM LASER/ STONE REMOVAL  STENT PLACEMENT;  Surgeon: Ardis Hughs, MD;  Location: [facility];  Service: Urology;  Laterality: Right;   ERCP N/A 06/22/2019   Procedure: ENDOSCOPIC RETROGRADE CHOLANGIOPANCREATOGRAPHY (ERCP);  Surgeon: Clarene Essex, MD;  Location: [facility];  Service: Endoscopy;  Laterality: N/A;   EYE SURGERY     cataract and glaucome   LUMBAR LAMINECTOMY/DECOMPRESSION MICRODISCECTOMY N/A 10/19/2015   Procedure: Lumbar three to four LAMINECTOMY/FORAMINOTOMY;  Surgeon: Erline Levine, MD;  Location: [facility];  Service: Neurosurgery;  Laterality: N/A;   LUNG SURGERY  yrs ago   right and left lungs - "had air pockets"  birth defect   REMOVAL OF STONES  06/22/2019   Procedure: REMOVAL OF STONES;  Surgeon: Clarene Essex, MD;  Location: [facility];  Service: Endoscopy;;   SPHINCTEROTOMY  06/22/2019   Procedure: Joan Mayans;  Surgeon: Clarene Essex, MD;  Location: [facility];  Service: Endoscopy;;    Allergies  Allergen Reactions   Other Rash and Other (See Comments)    CHG soap and wipes patient states today(06/21/19), he does not recall this allergy. He completed the pre-op chg baths without any problems.    Current Outpatient Medications  Medication Sig Dispense Refill   Ascorbic Acid (VITAMIN C) 1000 MG tablet Take 1,000 mg by mouth every evening.      aspirin 325 MG tablet Take 325 mg by mouth daily.     BEE POLLEN PO Take 3 capsules by mouth every evening.      pantoprazole (PROTONIX) 40 MG tablet Take 1 tablet by mouth daily.     pyridOXINE (VITAMIN B-6) 100 MG tablet Take 100 mg by mouth every evening.     spironolactone (ALDACTONE) 50 MG tablet Take 50 mg by mouth daily.     vitamin B-12  (CYANOCOBALAMIN) 100 MCG tablet Take 100 mcg by mouth every evening.      aspirin EC 81 MG tablet Take 1 tablet (81 mg total) by mouth daily. Swallow whole. (Patient not taking: Reported on 08/07/2020) 90 tablet 3   atenolol (TENORMIN) 50 MG tablet Take 1.5 tablets (75 mg total) by mouth daily. 135 tablet 3   rosuvastatin (CRESTOR) 20 MG tablet Take 1 tablet (20 mg total) by mouth daily. 90 tablet 3   torsemide (DEMADEX) 20 MG tablet Take 72m (1 tablet) in the morning and 285m(1 tablet) in the afternoon at around 3pm or 4pm. 180 tablet 3   No current facility-administered medications for this visit.    Social History   Socioeconomic History   Marital status: Married    Spouse name: Not on file   Number of children: Not on file   Years of education: Not on file   Highest education level: Not on file  Occupational History   Not on file  Tobacco Use   Smoking status: Former    Types: Cigarettes    Quit date: 08/25/1988    Years since quitting: 31.9   Smokeless tobacco: Never  Vaping Use   Vaping Use: Never used  Substance and Sexual Activity   Alcohol use: No    Alcohol/week: 0.0 standard drinks   Drug use: No   Sexual activity: Not Currently  Other Topics Concern   Not on file  Social History Narrative   Not on file   Social Determinants of Health   Financial Resource Strain: Not on file  Food Insecurity: Not on file  Transportation Needs: Not on file  Physical Activity: Not on file  Stress:  Not on file  Social Connections: Not on file  Intimate Partner Violence: Not on file    Family History  Problem Relation Age of Onset   Hypertension Father    Hypertension Sister    Heart attack Brother    He is married has 3 children 4 grandchildren 2 great-grandchildren. There is remote tobacco history.  ROS General: Negative; No fevers, chills, or night sweats;  HEENT:  positive for cataract/glaucoma surgery, no sinus congestion, difficulty swallowing Pulmonary:  Negative; No cough, wheezing, shortness of breath, hemoptysis Cardiovascular: See HPI, positive for leg swelling GI: Negative; No nausea, vomiting, diarrhea, or abdominal pain GU: Negative; No dysuria, hematuria, or difficulty voiding Musculoskeletal: Positive for low back pain and herniated disks; he walks with a cane status post recent surgery on L3-4 and L4-5;  Hematologic/Oncology: Negative; no easy bruising, bleeding Endocrine: Negative; no heat/cold intolerance; no diabetes Neuro: Negative; no changes in balance, headaches Skin: Negative; No rashes or skin lesions Psychiatric: Negative; No behavioral problems, depression Sleep: Positive for obstructive sleep apnea on CPAP therapy since 2010.  Machine malfunction, with breakthrough snoring, nonrestorative sleep, daytime somnolence.  No bruxism, restless legs, hypnogognic hallucinations, no cataplexy Other comprehensive 14 point system review is negative.   PE BP 127/76   Pulse 66   Ht _0  (1.854 m)   Wt 247 lb (112 kg)   SpO2 95%   BMI 32.59 kg/m    Repeat blood pressure by me was 124/74  Wt Readings from Last 3 Encounters:  08/07/20 247 lb (112 kg)  04/16/20 256 lb 12.8 oz (116.5 kg)  12/15/19 261 lb (118.4 kg)    General: Alert, oriented, no distress.  Skin: normal turgor, no rashes, warm and dry HEENT: Normocephalic, atraumatic. Pupils equal round and reactive to light; sclera anicteric; extraocular muscles intact;  Nose without nasal septal hypertrophy Mouth/Parynx benign; Mallinpatti scale 3 Neck: No JVD, no carotid bruits; normal carotid upstroke Lungs: clear to ausculatation and percussion; no wheezing or rales Chest wall: without tenderness to palpitation Heart: PMI not displaced, RRR, s1 s2 normal, 1/6 systolic murmur, no diastolic murmur, no rubs, gallops, thrills, or heaves Abdomen: soft, nontender; no hepatosplenomehaly, BS+; abdominal aorta nontender and not dilated by palpation. Back: no CVA  tenderness Pulses 2+ Musculoskeletal: full range of motion, normal strength, no joint deformities Extremities: 2+ lower extremity edema bilaterally; no clubbing cyanosis , Homan's sign negative  Neurologic: grossly nonfocal; Cranial nerves grossly wnl Psychologic: Normal mood and affect   ECG (independently read by me):  NSR at 66; LAD, RBBB with repolarization changes.     April 2022 ECG (independently read by me): Sinus rhythm at 63; PVC, PAD; RBBB  December 2020 ECG (independently read by me): Normal sinus rhythm at 62 bpm, right bundle branch block with left anterior hemiblock.  Probable LVH.  QTc interval 462 ms  November 2019 ECG (independently read by me): Probable low atrial rhythm with inverted P waves in lead III and aVF.  Right bundle branch block with repolarization.  August 2019 ECG (independently read by me):  Sinus bradycardia at 55; RBBB, LAHB, normal intervals  June 2019 ECG (independently read by me): Normal sinus rhythm at 60 bpm.  Right bundle branch block with repolarization changes.  March 2019 ECG (independently read by me): sinus rhythm at 86 bpm with occasional PACs. 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  QTc interval 473 ms  September 2018 ECG (independently read by me): Normal sinus rhythm  at 65 bpm.  Right bundle-branch block with repolarization changes.  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  LVH by voltage.  Normal intervals.  No ectopy.  February 2018 ECG (independently read by me): Sinus tachycardia at 102 bpm, right bundle branch block, left anterior hemiblock.  LVH.  February 2017 ECG (independently read by me): Normal sinus rhythm at 63 bpm.  Right bundle-branch block with repolarization changes.  Left axis deviation.  Normal intervals.  December 2015 ECG (independently read by me): Sinus bradycardia 56 bpm.  Right bundle branch block with repolarization changes.  Normal intervals.  December  2014 ECG: Sinus rhythm at 67 beats per minute with right bundle branch block and nonspecific ST-T changes. Qtc 464 msec  LABS: BMP Latest Ref Rng & Units 04/16/2020 11/06/2019 06/23/2019  Glucose 65 - 99 mg/dL 89 98 113(H)  BUN 8 - 27 mg/dL _0 Creatinine 0.76 - 1.27 mg/dL 1.16 0.91 0.77  BUN/Creat Ratio 10 - 24 12 - -  Sodium 134 - 144 mmol/L 135 134(L) 135  Potassium 3.5 - 5.2 mmol/L 4.0 4.1 3.8  Chloride 96 - 106 mmol/L 97 98 95(L)  CO2 20 - 29 mmol/L _1 Calcium 8.6 - 10.2 mg/dL 8.9 8.5(L) 8.3(L)   Hepatic Function Latest Ref Rng & Units 04/16/2020 11/06/2019 06/23/2019  Total Protein 6.0 - 8.5 g/dL 8.4 6.9 6.5  Albumin 3.7 - 4.7 g/dL 2.7(L) 2.6(L) 2.6(L)  AST 0 - 40 IU/L 47(H) 49(H) 40  ALT 0 - 44 IU/L _2 Alk Phosphatase 44 - 121 IU/L 178(H) 128(H) 128(H)  Total Bilirubin 0.0 - 1.2 mg/dL 1.0 1.8(H) 0.8  Bilirubin, Direct <=0.2 mg/dL - - -   CBC Latest Ref Rng & Units 04/16/2020 11/06/2019 06/23/2019  WBC 3.4 - 10.8 x10E3/uL 5.7 6.1 10.9(H)  Hemoglobin 13.0 - 17.7 g/dL 12.4(L) 12.7(L) 10.0(L)  Hematocrit 37.5 - 51.0 % 38.0 40.4 32.5(L)  Platelets 150 - 450 x10E3/uL 175 168 130(L)   Lab Results  Component Value Date   TSH 3.270 04/16/2020   Lab Results  Component Value Date   MCV 88 04/16/2020   MCV 93.1 11/06/2019   MCV 94.5 06/23/2019   Lipid Panel     Component Value Date/Time   CHOL 144 10/28/2016 1155   TRIG 87 10/28/2016 1155   HDL 64 10/28/2016 1155   CHOLHDL 2.3 10/28/2016 1155   CHOLHDL 2.1 04/17/2016 1112   VLDL 18 04/17/2016 1112   LDLCALC 63 10/28/2016 1155     RADIOLOGY: No results found.  IMPRESSION:  1. Coronary artery disease involving coronary bypass graft of native heart without angina pectoris   2. Hx of CABG   3. Bilateral leg edema   4. Hyperlipidemia LDL goal <70   5. History of cardiomyopathy   6. OSA (obstructive sleep apnea)   7. Medication management     ASSESSMENT AND PLAN: Joseph Bryan is a 80 year-old white  male who is 24 years status post an anterior wall myocardial infarction treated with PTCA of a totally occluded LAD in 1998. Due to severe  left main stenosis he underwent CABG surgery with a LIMA to his LAD, vein to diagonal, and vein to the third marginal vessel. He has been documented have significant salvage of myocardium from his acute PTCA. An echo Doppler study done preoperatively showed a normal  ejection fraction with mild to moderate TR.  A nuclear perfusion study in 2014 showed distal LAD territory scar concordant with his prior myocardial  infarction.   His last nuclear study revealed an EF of 36% with scar and raised the possibility of mild transient ischemic dilation. An echo Doppler study October 17, 2016 however showed an EF of 45-50% and there was diffuse hypokinesis with grade 1 diastolic dysfunction.  There was mild aortic insufficiency.  His left atrium was mild to moderately dilated.  There was moderate tricuspid regurgitation.  Peak PA pressure was mildly elevated at 35 mm presently.  The past, he has had significant difficulty with edema been on metolazone which ultimately was discontinued.  Presently, he continues to have significant edema on his regimen of spironolactone 50 mg and torsemide 20 mg in the morning.  I have suggested he increase torsemide to 20 mg in the morning and take 20 mg at 3 PM.  I again have stressed the importance of support stockings at 20 to 30 mm support. His ECG shows sinus rhythm in the mid 60s on atenolol 75 mg daily.  Reviewed his laboratory from April.  I will recheck a be met in 2 weeks on his increased torsemide.  He has a history of sleep apnea and has a nonfunctional machine.  I had recommended a follow-up evaluation but it appears that this was never pursued.  He continues to be on rosuvastatin 20 mg for hyperlipidemia with target LDL less than 70.  I recommended he reduce his aspirin to 81 mg of enteric-coated aspirin.  I will see him in 3 to 4 months for  follow-up evaluation    Troy Sine, MD, [facility]  08/09/2020 11:33 AM

## 2020-08-09 ENCOUNTER — Encounter: Payer: Self-pay | Admitting: Cardiovascular Disease

## 2020-08-31 LAB — BASIC METABOLIC PANEL
BUN/Creatinine Ratio: 13 (ref 10–24)
BUN: 13 mg/dL (ref 8–27)
CO2: 26 mmol/L (ref 20–29)
Calcium: 9.1 mg/dL (ref 8.6–10.2)
Chloride: 101 mmol/L (ref 96–106)
Creatinine, Ser: 1 mg/dL (ref 0.76–1.27)
Glucose: 122 mg/dL — ABNORMAL HIGH (ref 65–99)
Potassium: 4.6 mmol/L (ref 3.5–5.2)
Sodium: 136 mmol/L (ref 134–144)
eGFR: 76 mL/min/{1.73_m2} (ref >59)

## 2020-12-21 ENCOUNTER — Other Ambulatory Visit: Payer: Self-pay

## 2021-01-10 ENCOUNTER — Other Ambulatory Visit: Payer: Self-pay

## 2021-06-10 ENCOUNTER — Other Ambulatory Visit: Payer: Self-pay | Admitting: Cardiovascular Disease

## 2021-07-25 ENCOUNTER — Other Ambulatory Visit: Payer: Self-pay | Admitting: Cardiovascular Disease

## 2021-09-05 ENCOUNTER — Encounter: Payer: Self-pay | Admitting: Nurse Practitioner

## 2021-09-05 ENCOUNTER — Ambulatory Visit: Payer: Medicare Other | Admitting: Nurse Practitioner

## 2021-09-05 VITALS — BP 100/56 | HR 54 | Ht 73.0 in | Wt 262.6 lb

## 2021-09-05 DIAGNOSIS — I351 Nonrheumatic aortic (valve) insufficiency: Secondary | ICD-10-CM | POA: Diagnosis not present

## 2021-09-05 DIAGNOSIS — R600 Localized edema: Secondary | ICD-10-CM | POA: Diagnosis not present

## 2021-09-05 DIAGNOSIS — I2510 Atherosclerotic heart disease of native coronary artery without angina pectoris: Secondary | ICD-10-CM

## 2021-09-05 DIAGNOSIS — R188 Other ascites: Secondary | ICD-10-CM

## 2021-09-05 DIAGNOSIS — I340 Nonrheumatic mitral (valve) insufficiency: Secondary | ICD-10-CM | POA: Diagnosis not present

## 2021-09-05 DIAGNOSIS — I10 Essential (primary) hypertension: Secondary | ICD-10-CM

## 2021-09-05 DIAGNOSIS — E785 Hyperlipidemia, unspecified: Secondary | ICD-10-CM

## 2021-09-05 DIAGNOSIS — K7460 Unspecified cirrhosis of liver: Secondary | ICD-10-CM

## 2021-09-05 DIAGNOSIS — Z8679 Personal history of other diseases of the circulatory system: Secondary | ICD-10-CM | POA: Diagnosis not present

## 2021-09-05 DIAGNOSIS — G4733 Obstructive sleep apnea (adult) (pediatric): Secondary | ICD-10-CM

## 2021-09-05 MED ORDER — TORSEMIDE 20 MG PO TABS: ORAL_TABLET | ORAL | 3 refills | Status: DC

## 2021-09-05 NOTE — Patient Instructions (Signed)
Medication Instructions:  Torsemide 40 mg twice daily for 3 days. Then take 40 mg in he morning and 20 mg in the afternoon.   *If you need a refill on your cardiac medications before your next appointment, please call your pharmacy*   Lab Work: Your physician recommends that you return for lab work in 1 week BMET  If you have labs (blood work) drawn today and your tests are completely normal, you will receive your results only by: [place] (if you have MyChart) OR A paper copy in the mail If you have any lab test that is abnormal or we need to change your treatment, we will call you to review the results.   Testing/Procedures: NONE ordered at this time of appointment     Follow-Up: At [facility], you and your health needs are our priority.  As part of our continuing mission to provide you with exceptional heart care, we have created designated Provider Care Teams.  These Care Teams include your primary Cardiologist (physician) and Advanced Practice Providers (APPs -  Physician Assistants and Nurse Practitioners) who all work together to provide you with the care you need, when you need it.  We recommend signing up for the patient portal called "MyChart".  Sign up information is provided on this After Visit Summary.  MyChart is used to connect with patients for Virtual Visits (Telemedicine).  Patients are able to view lab/test results, encounter notes, upcoming appointments, etc.  Non-urgent messages can be sent to your provider as well.   To learn more about what you can do with MyChart, go to [email or web address].    Your next appointment:   2 week(s)  The format for your next appointment:   In Person  Provider:   Diona Browner, NP        Other Instructions Monitor blood pressure.   Important Information About Sugar

## 2021-09-05 NOTE — Progress Notes (Signed)
Office Visit    Patient Name: Joseph Bryan Date of Encounter: 09/05/2021  Primary Care Provider:  Leonard Downing, MD Primary Cardiologist:  Shelva Majestic, MD  Chief Complaint   81 year old male with a history of CAD s/p PCI, CABG x 3 in 1998, RBBB, hypertension, hyperlipidemia, mitral valve regurgitation, aortic valve regurgitation, bilateral lower extremity edema, and OSA on CPAP who presents for follow-up related to CAD.  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   Coronary artery disease    Glaucoma    Heart disease    History of kidney stones    Hyperlipidemia    Hypertension    Myocardial infarction ([place]) 17 yrs ago   Neuromuscular disorder (HCC)    neuropathy feet   Neuropathy    feet   Pneumonia    Renal insufficiency    Sleep apnea with use of continuous positive airway pressure (CPAP)    uses CPAP   Past Surgical History:  Procedure Laterality Date   APPENDECTOMY  2014   CHOLECYSTECTOMY N/A 06/21/2019   Procedure: LAPAROSCOPIC CHOLECYSTECTOMY WITH INTRAOPERATIVE CHOLANGIOGRAM;  Surgeon: Armandina Gemma, MD;  [facility]: [facility];  Service: General;  Laterality: N/A;   COLON RESECTION N/A 08/27/2012   Procedure: Diagnostic laparoscopy; Ileocecectomy;  Surgeon: Madilyn Hook, DO;  [facility];  Service: General;  Laterality: N/A;   COLONOSCOPY WITH PROPOFOL N/A 04/09/2015   Procedure: COLONOSCOPY WITH PROPOFOL;  Surgeon: Garlan Fair, MD;  [facility];  Service: Endoscopy;  Laterality: N/A;   CORONARY ARTERY BYPASS GRAFT  1998   x5   CYSTOSCOPY/URETEROSCOPY/HOLMIUM LASER/STENT PLACEMENT Right 02/25/2018   Procedure: RIGHT URETEROSCOPY/HOLMIUM LASER/ STONE REMOVAL  STENT PLACEMENT;  Surgeon: Ardis Hughs, MD;  [facility];  Service: Urology;  Laterality: Right;   ERCP N/A 06/22/2019   Procedure:  ENDOSCOPIC RETROGRADE CHOLANGIOPANCREATOGRAPHY (ERCP);  Surgeon: Clarene Essex, MD;  Location: [facility];  Service: Endoscopy;  Laterality: N/A;   EYE SURGERY     cataract and glaucome   LUMBAR LAMINECTOMY/DECOMPRESSION MICRODISCECTOMY N/A 10/19/2015   Procedure: Lumbar three to four LAMINECTOMY/FORAMINOTOMY;  Surgeon: Erline Levine, MD;  Location: [facility];  Service: Neurosurgery;  Laterality: N/A;   LUNG SURGERY  yrs ago   right and left lungs - "had air pockets"  birth defect   REMOVAL OF STONES  06/22/2019   Procedure: REMOVAL OF STONES;  Surgeon: Clarene Essex, MD;  [facility];  Service: Endoscopy;;   SPHINCTEROTOMY  06/22/2019   Procedure: Joan Mayans;  Surgeon: Clarene Essex, MD;  [facility];  Service: Endoscopy;;    Allergies  Allergies  Allergen Reactions   Other Rash and Other (See Comments)    CHG soap and wipes patient states today(06/21/19), he does not recall this allergy. He completed the pre-op chg baths without any problems.    History of Present Illness    81 year old male with the above past medical history including CAD s/p PCI, CABG x 3 in 1998, RBBB, hypertension, hyperlipidemia, mitral valve regurgitation, aortic valve regurgitation, bilateral lower extremity edema, and OSA on CPAP.  Cardiac catheterization in 2010 showed patent grafts.  Myoview in 08/2016 showed EF 36%, defect of moderate severity in the apical anterior, apical septal, and apex location, not reversible and consistent with prior infarct, no ischemia was noted.  Most recent echocardiogram  in December 2021 showed EF 45 to 50%, mildly decreased LV function, mild mitral valve regurgitation, mild aortic valve regurgitation, mild aortic valve sclerosis with no evidence of aortic valve stenosis.  He was last seen in the office on 08/07/2020 and was noted to have lower extremity edema.  Torsemide was increased to 20 mg bid.   He presents today for follow-up accompanied by his wife.  Since his  last visit, he has been stable from a cardiac standpoint.  He does note ongoing bilateral lower extremity edema.  He was recently treated for right lower extremity cellulitis.  He denies dyspnea, PND, orthopnea, denies symptoms concerning for angina.  Other than his ongoing lower extremity edema, he denies any additional concerns today.  Home Medications    Current Outpatient Medications  Medication Sig Dispense Refill   Ascorbic Acid (VITAMIN C) 1000 MG tablet Take 1,000 mg by mouth every evening.      aspirin EC 81 MG tablet Take 1 tablet (81 mg total) by mouth daily. Swallow whole. 90 tablet 3   atenolol (TENORMIN) 50 MG tablet Take 1.5 tablets (75 mg total) by mouth daily. 135 tablet 3   BEE POLLEN PO Take 3 capsules by mouth every evening.      pantoprazole (PROTONIX) 40 MG tablet Take 1 tablet by mouth daily.     pyridOXINE (VITAMIN B-6) 100 MG tablet Take 100 mg by mouth every evening.     rosuvastatin (CRESTOR) 20 MG tablet Take 1 tablet (20 mg total) by mouth daily. 90 tablet 3   spironolactone (ALDACTONE) 50 MG tablet Take 50 mg by mouth daily.     torsemide (DEMADEX) 20 MG tablet TAKE 1 TABLET BY MOUTH IN THE  MORNING AND 1 TABLET BY MOUTH IN THE AFTERNOON AROUND 3 PM OR 4  PM (Patient taking differently: TAKE 40 MG TWICE DAILY FOR 3 DAYS. THEN TAKE 40 MG IN THE MORNING AND 20 MG AT NOON) 30 tablet 0   torsemide (DEMADEX) 20 MG tablet TAKE 40 MG TWICE DAILY FOR 3 DAYS. THEN TAKE 40 MG IN THE MORNING AND 20 MG AT NOON. 360 tablet 3   vitamin B-12 (CYANOCOBALAMIN) 100 MCG tablet Take 100 mcg by mouth every evening.      No current facility-administered medications for this visit.     Review of Systems    He denies chest pain, palpitations, dyspnea, pnd, orthopnea, n, v, dizziness, syncope, edema, weight gain, or early satiety. All other systems reviewed and are otherwise negative except as noted above.   Physical Exam    VS:  BP (!) 100/56   Pulse (!) 54   Ht '6\' 1"'$  (1.854 m)    Wt 262 lb 9.6 oz (119.1 kg)   SpO2 95%   BMI 34.65 kg/m  GEN: Well nourished, well developed, in no acute distress. HEENT: normal. Neck: Supple, no JVD, carotid bruits, or masses. Cardiac: RRR, no murmurs, rubs, or gallops. No clubbing, cyanosis, 2+ bilateral lower extremity edema.  Radials/DP/PT 2+ and equal bilaterally.  Respiratory:  Respirations regular and unlabored, clear to auscultation bilaterally. GI: Soft, nontender, nondistended, BS + x 4. MS: no deformity or atrophy. Skin: warm and dry, no rash. Neuro:  Strength and sensation are intact. Psych: Normal affect.  Accessory Clinical Findings    ECG personally reviewed by me today -sinus bradycardia, 54 bpm, sinus arrhythmia, LAD, RBBB- no acute changes.   Lab Results  Component Value Date   WBC 5.7 04/16/2020   HGB 12.4 (L)  04/16/2020   HCT 38.0 04/16/2020   MCV 88 04/16/2020   PLT 175 04/16/2020   Lab Results  Component Value Date   CREATININE 1.00 08/30/2020   BUN 13 08/30/2020   NA 136 08/30/2020   K 4.6 08/30/2020   CL 101 08/30/2020   CO2 26 08/30/2020   Lab Results  Component Value Date   ALT 18 04/16/2020   AST 47 (H) 04/16/2020   ALKPHOS 178 (H) 04/16/2020   BILITOT 1.0 04/16/2020   Lab Results  Component Value Date   CHOL 144 10/28/2016   HDL 64 10/28/2016   LDLCALC 63 10/28/2016   TRIG 87 10/28/2016   CHOLHDL 2.3 10/28/2016    No results found for: "HGBA1C"  Assessment & Plan    1. Bilateral lower extremity edema/h/o cardiomyopathy:  Most recent echo in December 2021 showed EF 45 to 50%, mildly decreased LV function, mild mitral valve regurgitation, mild aortic valve regurgitation, mild aortic valve sclerosis with no evidence of aortic valve stenosis.  He continues to have bilateral lower extremity edema despite increasing torsemide.  Recent RLE cellulitis.  He denies dyspnea, PND, orthopnea.  Will increase torsemide to 40 mg twice daily x3 days followed by torsemide 60 mg daily (40 mg in am  and 20 mg in pm).  Will check BMET in 1 week.  Continue to monitor BP.  Plan for close follow-up.  Continue atenolol, spironolactone.  2. CAD: S/p CABG x 3 in 1998, cath in 2010 showed patent grafts. Myoview in 08/2016 negative for ischemia, EF 36%. Stable with no anginal symptoms. No indication for ischemic evaluation.  Continue aspirin, atenolol, spironolactone, torsemide as above. No longer on statin in the setting of nonalcoholic cirrhosis.  3. Valvular heart disease: Most recent echo as above (mild MR, mild AI).  Consider repeat echocardiogram as clinically indicated.  4. Hypertension: BP borderline in office today, well controlled at home.  He denies any dizziness, continue to monitor BP with titration of torsemide as above.  Otherwise, continue current antihypertensive regimen.   5. Hyperlipidemia: No recent LDL on file. No longer on statin due to non-alcoholic cirrhosis.  Continue aspirin.  6. OSA: Adherent to CPAP.  7. Non-alcoholic cirrhosis: Following with GI.   8. Disposition: Follow-up in 2 weeks.   Lenna Sciara, NP 09/05/2021, 4:51 PM

## 2021-09-14 LAB — BASIC METABOLIC PANEL
BUN/Creatinine Ratio: 19 (ref 10–24)
BUN: 23 mg/dL (ref 8–27)
CO2: 25 mmol/L (ref 20–29)
Calcium: 8.8 mg/dL (ref 8.6–10.2)
Chloride: 93 mmol/L — ABNORMAL LOW (ref 96–106)
Creatinine, Ser: 1.21 mg/dL (ref 0.76–1.27)
Glucose: 102 mg/dL — ABNORMAL HIGH (ref 70–99)
Potassium: 4.2 mmol/L (ref 3.5–5.2)
Sodium: 133 mmol/L — ABNORMAL LOW (ref 134–144)
eGFR: 60 mL/min/{1.73_m2} (ref >59)

## 2021-09-17 ENCOUNTER — Telehealth: Payer: Self-pay

## 2021-09-17 NOTE — Telephone Encounter (Signed)
Left a detailed message discussing lab results. Pt advised to continue his current medications and follow up as planned.

## 2021-09-18 ENCOUNTER — Emergency Department (HOSPITAL_COMMUNITY): Payer: Medicare Other

## 2021-09-18 ENCOUNTER — Encounter: Payer: Self-pay | Admitting: Nurse Practitioner

## 2021-09-18 ENCOUNTER — Encounter (HOSPITAL_COMMUNITY): Payer: Self-pay

## 2021-09-18 ENCOUNTER — Inpatient Hospital Stay (HOSPITAL_COMMUNITY)
Admission: EM | Admit: 2021-09-18 | Discharge: 2021-09-21 | DRG: 291 | Disposition: A | Discharge status: Home Health | Payer: Medicare Other | Attending: Internal Medicine | Admitting: Internal Medicine

## 2021-09-18 ENCOUNTER — Ambulatory Visit: Payer: Medicare Other | Attending: Nurse Practitioner | Admitting: Nurse Practitioner

## 2021-09-18 VITALS — BP 85/55 | HR 73 | Ht 73.0 in | Wt 266.6 lb

## 2021-09-18 DIAGNOSIS — E785 Hyperlipidemia, unspecified: Secondary | ICD-10-CM | POA: Diagnosis present

## 2021-09-18 DIAGNOSIS — E669 Obesity, unspecified: Secondary | ICD-10-CM | POA: Diagnosis present

## 2021-09-18 DIAGNOSIS — I2510 Atherosclerotic heart disease of native coronary artery without angina pectoris: Secondary | ICD-10-CM

## 2021-09-18 DIAGNOSIS — I452 Bifascicular block: Secondary | ICD-10-CM | POA: Diagnosis present

## 2021-09-18 DIAGNOSIS — I130 Hypertensive heart and chronic kidney disease with heart failure and stage 1 through stage 4 chronic kidney disease, or unspecified chronic kidney disease: Secondary | ICD-10-CM | POA: Diagnosis present

## 2021-09-18 DIAGNOSIS — G4733 Obstructive sleep apnea (adult) (pediatric): Secondary | ICD-10-CM

## 2021-09-18 DIAGNOSIS — E871 Hypo-osmolality and hyponatremia: Secondary | ICD-10-CM | POA: Diagnosis present

## 2021-09-18 DIAGNOSIS — Z8679 Personal history of other diseases of the circulatory system: Secondary | ICD-10-CM | POA: Diagnosis not present

## 2021-09-18 DIAGNOSIS — H409 Unspecified glaucoma: Secondary | ICD-10-CM | POA: Diagnosis present

## 2021-09-18 DIAGNOSIS — I10 Essential (primary) hypertension: Secondary | ICD-10-CM

## 2021-09-18 DIAGNOSIS — E8809 Other disorders of plasma-protein metabolism, not elsewhere classified: Secondary | ICD-10-CM | POA: Diagnosis not present

## 2021-09-18 DIAGNOSIS — J984 Other disorders of lung: Secondary | ICD-10-CM

## 2021-09-18 DIAGNOSIS — N1831 Chronic kidney disease, stage 3a: Secondary | ICD-10-CM | POA: Diagnosis present

## 2021-09-18 DIAGNOSIS — I872 Venous insufficiency (chronic) (peripheral): Secondary | ICD-10-CM | POA: Diagnosis present

## 2021-09-18 DIAGNOSIS — Z6835 Body mass index (BMI) 35.0-35.9, adult: Secondary | ICD-10-CM | POA: Diagnosis not present

## 2021-09-18 DIAGNOSIS — K7460 Unspecified cirrhosis of liver: Secondary | ICD-10-CM | POA: Diagnosis present

## 2021-09-18 DIAGNOSIS — D696 Thrombocytopenia, unspecified: Secondary | ICD-10-CM | POA: Diagnosis present

## 2021-09-18 DIAGNOSIS — Z8589 Personal history of malignant neoplasm of other organs and systems: Secondary | ICD-10-CM

## 2021-09-18 DIAGNOSIS — N179 Acute kidney failure, unspecified: Secondary | ICD-10-CM

## 2021-09-18 DIAGNOSIS — R188 Other ascites: Secondary | ICD-10-CM

## 2021-09-18 DIAGNOSIS — Z9861 Coronary angioplasty status: Secondary | ICD-10-CM | POA: Diagnosis not present

## 2021-09-18 DIAGNOSIS — G629 Polyneuropathy, unspecified: Secondary | ICD-10-CM | POA: Diagnosis present

## 2021-09-18 DIAGNOSIS — R600 Localized edema: Secondary | ICD-10-CM | POA: Diagnosis not present

## 2021-09-18 DIAGNOSIS — J188 Other pneumonia, unspecified organism: Secondary | ICD-10-CM | POA: Diagnosis present

## 2021-09-18 DIAGNOSIS — I952 Hypotension due to drugs: Secondary | ICD-10-CM

## 2021-09-18 DIAGNOSIS — I340 Nonrheumatic mitral (valve) insufficiency: Secondary | ICD-10-CM

## 2021-09-18 DIAGNOSIS — I2583 Coronary atherosclerosis due to lipid rich plaque: Secondary | ICD-10-CM

## 2021-09-18 DIAGNOSIS — I5020 Unspecified systolic (congestive) heart failure: Secondary | ICD-10-CM | POA: Diagnosis not present

## 2021-09-18 DIAGNOSIS — I959 Hypotension, unspecified: Secondary | ICD-10-CM | POA: Diagnosis present

## 2021-09-18 DIAGNOSIS — E876 Hypokalemia: Secondary | ICD-10-CM | POA: Diagnosis not present

## 2021-09-18 DIAGNOSIS — Z79899 Other long term (current) drug therapy: Secondary | ICD-10-CM

## 2021-09-18 DIAGNOSIS — I5022 Chronic systolic (congestive) heart failure: Secondary | ICD-10-CM | POA: Diagnosis not present

## 2021-09-18 DIAGNOSIS — Z7982 Long term (current) use of aspirin: Secondary | ICD-10-CM | POA: Diagnosis not present

## 2021-09-18 DIAGNOSIS — Z87891 Personal history of nicotine dependence: Secondary | ICD-10-CM

## 2021-09-18 DIAGNOSIS — Z951 Presence of aortocoronary bypass graft: Secondary | ICD-10-CM

## 2021-09-18 DIAGNOSIS — I252 Old myocardial infarction: Secondary | ICD-10-CM

## 2021-09-18 DIAGNOSIS — I5042 Chronic combined systolic (congestive) and diastolic (congestive) heart failure: Secondary | ICD-10-CM | POA: Diagnosis not present

## 2021-09-18 DIAGNOSIS — J189 Pneumonia, unspecified organism: Secondary | ICD-10-CM | POA: Diagnosis not present

## 2021-09-18 DIAGNOSIS — I255 Ischemic cardiomyopathy: Secondary | ICD-10-CM | POA: Diagnosis present

## 2021-09-18 DIAGNOSIS — I351 Nonrheumatic aortic (valve) insufficiency: Secondary | ICD-10-CM

## 2021-09-18 DIAGNOSIS — Z8249 Family history of ischemic heart disease and other diseases of the circulatory system: Secondary | ICD-10-CM

## 2021-09-18 DIAGNOSIS — I5043 Acute on chronic combined systolic (congestive) and diastolic (congestive) heart failure: Secondary | ICD-10-CM | POA: Diagnosis present

## 2021-09-18 DIAGNOSIS — Z9989 Dependence on other enabling machines and devices: Secondary | ICD-10-CM

## 2021-09-18 LAB — BASIC METABOLIC PANEL
Anion gap: 8 (ref 5–15)
BUN: 28 mg/dL — ABNORMAL HIGH (ref 8–23)
CO2: 26 mmol/L (ref 22–32)
Calcium: 8.4 mg/dL — ABNORMAL LOW (ref 8.9–10.3)
Chloride: 97 mmol/L — ABNORMAL LOW (ref 98–111)
Creatinine, Ser: 1.35 mg/dL — ABNORMAL HIGH (ref 0.61–1.24)
GFR, Estimated: 53 mL/min — ABNORMAL LOW (ref >60)
Glucose, Bld: 108 mg/dL — ABNORMAL HIGH (ref 70–99)
Potassium: 4.4 mmol/L (ref 3.5–5.1)
Sodium: 131 mmol/L — ABNORMAL LOW (ref 135–145)

## 2021-09-18 LAB — CBC WITH DIFFERENTIAL/PLATELET
Abs Immature Granulocytes: 0.11 10*3/uL — ABNORMAL HIGH (ref 0.00–0.07)
Basophils Absolute: 0.1 10*3/uL (ref 0.0–0.1)
Basophils Relative: 0 %
Eosinophils Absolute: 0.1 10*3/uL (ref 0.0–0.5)
Eosinophils Relative: 1 %
HCT: 34.7 % — ABNORMAL LOW (ref 39.0–52.0)
Hemoglobin: 11.5 g/dL — ABNORMAL LOW (ref 13.0–17.0)
Immature Granulocytes: 1 %
Lymphocytes Relative: 7 %
Lymphs Abs: 1.1 10*3/uL (ref 0.7–4.0)
MCH: 33 pg (ref 26.0–34.0)
MCHC: 33.1 g/dL (ref 30.0–36.0)
MCV: 99.4 fL (ref 80.0–100.0)
Monocytes Absolute: 1.8 10*3/uL — ABNORMAL HIGH (ref 0.1–1.0)
Monocytes Relative: 11 %
Neutro Abs: 12.9 10*3/uL — ABNORMAL HIGH (ref 1.7–7.7)
Neutrophils Relative %: 80 %
Platelets: 125 10*3/uL — ABNORMAL LOW (ref 150–400)
RBC: 3.49 MIL/uL — ABNORMAL LOW (ref 4.22–5.81)
RDW: 17 % — ABNORMAL HIGH (ref 11.5–15.5)
WBC: 16.1 10*3/uL — ABNORMAL HIGH (ref 4.0–10.5)
nRBC: 0 % (ref 0.0–0.2)

## 2021-09-18 LAB — BRAIN NATRIURETIC PEPTIDE: B Natriuretic Peptide: 388.3 pg/mL — ABNORMAL HIGH (ref 0.0–100.0)

## 2021-09-18 MED ORDER — SODIUM CHLORIDE 0.9 % IV SOLN: 1.0000 g | Freq: Once | INTRAVENOUS | Status: DC

## 2021-09-18 MED ORDER — SODIUM CHLORIDE 0.9 % IV SOLN: 500.0000 mg | Freq: Once | INTRAVENOUS | Status: DC

## 2021-09-18 MED ORDER — VANCOMYCIN HCL 2000 MG/400ML IV SOLN
2000.0000 mg | Freq: Once | INTRAVENOUS | Status: AC
  Administered 2021-09-18: 2000 mg via INTRAVENOUS
  Filled 2021-09-18: qty 400

## 2021-09-18 MED ORDER — IOHEXOL 350 MG/ML SOLN
75.0000 mL | Freq: Once | INTRAVENOUS | Status: AC | PRN
Start: 2021-09-18 — End: 2021-09-18
  Administered 2021-09-18: 100 mL via INTRAVENOUS

## 2021-09-18 MED ORDER — SODIUM CHLORIDE 0.9 % IV SOLN
2.0000 g | Freq: Once | INTRAVENOUS | Status: AC
  Administered 2021-09-18: 2 g via INTRAVENOUS
  Filled 2021-09-18: qty 12.5

## 2021-09-18 MED ORDER — ENOXAPARIN SODIUM 40 MG/0.4ML IJ SOSY
40.0000 mg | PREFILLED_SYRINGE | INTRAMUSCULAR | Status: DC
  Administered 2021-09-19 – 2021-09-21 (×3): 40 mg via SUBCUTANEOUS
  Filled 2021-09-18 (×3): qty 0.4

## 2021-09-18 MED ORDER — FUROSEMIDE 10 MG/ML IJ SOLN
40.0000 mg | Freq: Once | INTRAMUSCULAR | Status: AC
Start: 2021-09-18 — End: 2021-09-18
  Administered 2021-09-18: 40 mg via INTRAVENOUS
  Filled 2021-09-18: qty 4

## 2021-09-18 NOTE — H&P (Signed)
History and Physical    Patient: Joseph Bryan SJG:[id number] DOB: 03/06/1940 DOA: 09/18/2021 DOS: the patient was seen and examined on 09/19/2021 PCP: Leonard Downing, MD  Patient coming from: Home  Chief Complaint:  Chief Complaint  Patient presents with   Leg Swelling   HPI: Joseph Bryan is a 81 y.o. male with medical history significant of CAD s/p PCI, CABG x 3 in 1998, RBBB, ICM, chronic systolic heart failure,nonalcoholic cirrhosis, hypertension, hyperlipidemia, CKD with atrophic Lf kidney, and OSA on CPAP who presents for hypotension and increase LE edema.   Pt was sent by cardiology office today for hypotension. He has been having increase LE edema for past 2 weeks. Has been adjusting oral diuresis now up to '60mg'$  daily of Torsemide ('40mg'$  in the AM and '20mg'$  PM). However no real improvement in edema with increase of 4lbs and was noted to be hypotensive to 80/55 in office. Then sent to ED for further evaluation. Otherwise denies any shortness of breath, chest pain. No fever. Developed a productive cough today. Denies recent aspiration. No nausea, vomiting or diarrhea.   In the ED, he was afebrile with temperature of 99.1 F, borderline hypotensive at times in the 90s over 40s on room air. Has leukocytosis of 16.1, hemoglobin of 11.5, platelet of 125.  Mild hyponatremia of 131, CBG of 108.  AKI with creatinine of 1.35 up from 1.21 a week ago. BNP of 388. EKG on my review with RBBB and LAFB.  CTA chest was negative for pulmonary embolism but showed right upper lobe cavitary pneumonia with a 2 cm central cavitary.  He was given 40 mg of Lasix and started on IV vancomycin and cefepime in the ED.  Review of Systems: As mentioned in the history of present illness. All other systems reviewed and are negative. 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   Coronary artery  disease    Glaucoma    Heart disease    History of kidney stones    Hyperlipidemia    Hypertension    Myocardial infarction ([place]) 17 yrs ago   Neuromuscular disorder (HCC)    neuropathy feet   Neuropathy    feet   Pneumonia    Renal insufficiency    Sleep apnea with use of continuous positive airway pressure (CPAP)    uses CPAP   Past Surgical History:  Procedure Laterality Date   APPENDECTOMY  2014   CHOLECYSTECTOMY N/A 06/21/2019   Procedure: LAPAROSCOPIC CHOLECYSTECTOMY WITH INTRAOPERATIVE CHOLANGIOGRAM;  Surgeon: Armandina Gemma, MD;  [facility];  Service: General;  Laterality: N/A;   COLON RESECTION N/A 08/27/2012   Procedure: Diagnostic laparoscopy; Ileocecectomy;  Surgeon: Madilyn Hook, DO;  [facility];  Service: General;  Laterality: N/A;   COLONOSCOPY WITH PROPOFOL N/A 04/09/2015   Procedure: COLONOSCOPY WITH PROPOFOL;  Surgeon: Garlan Fair, MD;  [facility];  Service: Endoscopy;  Laterality: N/A;   CORONARY ARTERY BYPASS GRAFT  1998   x5   CYSTOSCOPY/URETEROSCOPY/HOLMIUM LASER/STENT PLACEMENT Right 02/25/2018   Procedure: RIGHT URETEROSCOPY/HOLMIUM LASER/ STONE REMOVAL  STENT PLACEMENT;  Surgeon: Ardis Hughs, MD;  [facility];  Service: Urology;  Laterality: Right;   ERCP N/A 06/22/2019   Procedure: ENDOSCOPIC RETROGRADE CHOLANGIOPANCREATOGRAPHY (ERCP);  Surgeon: Clarene Essex, MD;  Location: [facility];  Service: Endoscopy;  Laterality: N/A;   EYE  SURGERY     cataract and glaucome   LUMBAR LAMINECTOMY/DECOMPRESSION MICRODISCECTOMY N/A 10/19/2015   Procedure: Lumbar three to four LAMINECTOMY/FORAMINOTOMY;  Surgeon: Erline Levine, MD;  Location: [facility];  Service: Neurosurgery;  Laterality: N/A;   LUNG SURGERY  yrs ago   right and left lungs - "had air pockets"  birth defect   REMOVAL OF STONES  06/22/2019   Procedure: REMOVAL OF STONES;  Surgeon: Clarene Essex, MD;  [facility];  Service: Endoscopy;;   SPHINCTEROTOMY  06/22/2019    Procedure: Joan Mayans;  Surgeon: Clarene Essex, MD;  [facility];  Service: Endoscopy;;   Social History:  reports that he quit smoking about 33 years ago. His smoking use included cigarettes. He has never used smokeless tobacco. He reports that he does not drink alcohol and does not use drugs.  Allergies  Allergen Reactions   Other Rash and Other (See Comments)    CHG soap and wipes patient states today(06/21/19), he does not recall this allergy. He completed the pre-op chg baths without any problems.    Family History  Problem Relation Age of Onset   Hypertension Father    Hypertension Sister    Heart attack Brother     Prior to Admission medications   Medication Sig Start Date End Date Taking? Authorizing Provider  Ascorbic Acid (VITAMIN C) 1000 MG tablet Take 1,000 mg by mouth every evening.     [provider]  aspirin EC 81 MG tablet Take 1 tablet (81 mg total) by mouth daily. Swallow whole. 12/15/19   Almyra Deforest, PA  atenolol (TENORMIN) 50 MG tablet Take 1.5 tablets (75 mg total) by mouth daily. 08/07/20   Troy Sine, MD  BEE POLLEN PO Take 3 capsules by mouth every evening.     [provider]  pantoprazole (PROTONIX) 40 MG tablet Take 1 tablet by mouth daily.    [provider]  spironolactone (ALDACTONE) 50 MG tablet Take 50 mg by mouth daily. 12/14/19   [provider]  torsemide (DEMADEX) 20 MG tablet TAKE 1 TABLET BY MOUTH IN THE  MORNING AND 1 TABLET BY MOUTH IN THE AFTERNOON AROUND 3 PM OR 4  PM Patient taking differently: TAKE 40 MG TWICE DAILY FOR 3 DAYS. THEN TAKE 40 MG IN THE MORNING AND 20 MG AT NOON 07/25/21   Troy Sine, MD  torsemide (DEMADEX) 20 MG tablet TAKE 40 MG TWICE DAILY FOR 3 DAYS. THEN TAKE 40 MG IN THE MORNING AND 20 MG AT NOON. 09/05/21   Lenna Sciara, NP  vitamin B-12 (CYANOCOBALAMIN) 100 MCG tablet Take 100 mcg by mouth every evening.     [provider]    Physical Exam: Vitals:    09/18/21 2230 09/18/21 2245 09/18/21 2300 09/18/21 2345  BP: (!) 82/63   (!) 99/52  Pulse: 83 70 (!) 59 69  Resp: (!) 21 12 (!) 23 16  Temp:    98.1 F (36.7 C)  TempSrc:    Oral  SpO2: 93% 94% 93% 91%   Constitutional: NAD, calm, comfortable, nontoxic appearing elderly male laying at approximately 20 degree incline in bed Eyes: PERRL, lids and conjunctivae normal ENMT: Mucous membranes are moist. Neck: normal, supple Respiratory: clear to auscultation bilaterally, no wheezing, no crackles. Normal respiratory effort. No accessory muscle use.  Cardiovascular: Regular rate and rhythm, no murmurs / rubs / gallops.  +4 pitting edema up to bilateral pretibial region.   Abdomen: Soft, nontender nondistended.  Bowel sounds  positive.  Musculoskeletal: no clubbing / cyanosis. No joint deformity upper and lower extremities. Good ROM, no contractures. Normal muscle tone.  Skin: Thickened skin bilateral pretibial region with hardened nodules from chronic edema. Neurologic: CN 2-12 grossly intact.  Strength 5/5 in all 4.  Psychiatric: Normal judgment and insight. Alert and oriented x 3. Normal mood. Data Reviewed:  See HPI  Assessment and Plan: * Hypotension Borderline BP with SBP in the 90s at times.  -Has received IV Lasix '40mg'$  in ED. Monitoring BP and renal function tomorrow before resuming diuresis  Lower extremity edema Bilateral LE edema/chronic systolic heart failure -Had persistent LE edema for 2 weeks with uptitration of Torsemide without significant improvement. However now with borderline BP and AKI likely from overdiuresis.  -Has already received IV '40mg'$  Lasix in ED. Hold diuretic for now pending monitoring of BP overnight.  Can resume tomorrow if blood pressure improves. -Last echocardiogram on 12/2019 with EF of 45 to 50% with global hypokinesis.  Mild mitral valve regurgitation.  Mild aortic valve regurgitation. Does not have any symptoms of dyspnea or orthopnea. Hold on repeat  echo.   Cavitary pneumonia No recent travel and low risk for TB. Not hypoxic.  -continue IV vancomycin and cefepime -obtain sputum culture  Thrombocytopenia (HCC) Likely reactive. Continue to monitor.  AKI (acute kidney injury) ([place]) KI with creatinine of 1.35 up from 1.21 a week ago. Likely due to increasing diuresis.  -avoid nephrotoxic agent  OSA on CPAP Pt has been nonadherent  CAD- s/p CABG in 1998 - patent grafts on cath in 2010 Stable and asymptomatic from a cardiac standpoint. Continue daily aspirin.  Obesity, unspecified BMI of 35      Advance Care Planning:   Code Status: Partial Code Limited-no ventilator  Consults: none  Family Communication: Discussed with wife at bedside  Severity of Illness: The appropriate patient status for this patient is INPATIENT. Inpatient status is judged to be reasonable and necessary in order to provide the required intensity of service to ensure the patient's safety. The patient's presenting symptoms, physical exam findings, and initial radiographic and laboratory data in the context of their chronic comorbidities is felt to place them at high risk for further clinical deterioration. Furthermore, it is not anticipated that the patient will be medically stable for discharge from the hospital within 2 midnights of admission.   * I certify that at the point of admission it is my clinical judgment that the patient will require inpatient hospital care spanning beyond 2 midnights from the point of admission due to high intensity of service, high risk for further deterioration and high frequency of surveillance required.*  Author: Orene Desanctis, DO 09/19/2021 12:14 AM  For on call review [email or web address].

## 2021-09-18 NOTE — ED Notes (Signed)
Needs IV

## 2021-09-18 NOTE — ED Provider Triage Note (Signed)
Emergency Medicine Provider Triage Evaluation Note  Joseph Bryan , a 81 y.o. male  was evaluated in triage.  Pt complains of abnormal blood pressure.  Was seen by cardiology today for follow-up of lower extremity edema.  Patient states he denies any complaints however was noted to be hypotensive into the 04U systolic while at the appointment.  He states he has chronic lower extremity swelling.  He is compliant with his home diuretics.  Denies any chest pain, shortness of breath, PND orthopnea.  Sleeps with 1 pillow.  Denies any fever.  States he feels otherwise well.  He was told the cardiology office he sounded like he had possible pneumonia and was sent here for further evaluation.  Review of Systems  Positive: Low Blood pressure Negative:   Physical Exam  BP (!) 123/54   Pulse 74   Temp 99.1 F (37.3 C) (Oral)   Resp 12   SpO2 90%  Gen:   Awake, no distress   Resp:  Normal effort  MSK:   Moves extremities without difficulty, pitting edema, BLE Other:    Medical Decision Making  Medically screening exam initiated at 4:25 PM.  Appropriate orders placed.  Joseph Bryan was informed that the remainder of the evaluation will be completed by another provider, this initial triage assessment does not replace that evaluation, and the importance of remaining in the ED until their evaluation is complete.     Joseph Bryan A, PA-C 09/18/21 1628

## 2021-09-18 NOTE — Progress Notes (Signed)
Office Visit    Patient Name: Joseph Bryan Date of Encounter: 09/18/2021  Primary Care Provider:  Leonard Downing, MD Primary Cardiologist:  Shelva Majestic, MD  Chief Complaint    81 year old male with a history of CAD s/p PCI, CABG x 3 in 1998, RBBB, ICM, chronic systolic heart failure, hypertension, hyperlipidemia, mitral valve regurgitation, aortic valve regurgitation, CKD with atrophic L kidney, and OSA on CPAP who presents for follow-up related to heart failure/bilateral lower extremity edema.  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"   Chronic kidney disease    left kidney nonfuctional due to blood clot   Coronary artery disease    Glaucoma    Heart disease    History of kidney stones    Hyperlipidemia    Hypertension    Myocardial infarction ([place]) 17 yrs ago   Neuromuscular disorder (HCC)    neuropathy feet   Neuropathy    feet   Pneumonia    Renal insufficiency    Sleep apnea with use of continuous positive airway pressure (CPAP)    uses CPAP   Past Surgical History:  Procedure Laterality Date   APPENDECTOMY  2014   CHOLECYSTECTOMY N/A 06/21/2019   Procedure: LAPAROSCOPIC CHOLECYSTECTOMY WITH INTRAOPERATIVE CHOLANGIOGRAM;  Surgeon: Armandina Gemma, MD;  Location: [facility];  Service: General;  Laterality: N/A;   COLON RESECTION N/A 08/27/2012   Procedure: Diagnostic laparoscopy; Ileocecectomy;  Surgeon: Madilyn Hook, DO;  Location: [facility];  Service: General;  Laterality: N/A;   COLONOSCOPY WITH PROPOFOL N/A 04/09/2015   Procedure: COLONOSCOPY WITH PROPOFOL;  Surgeon: Garlan Fair, MD;  [facility];  Service: Endoscopy;  Laterality: N/A;   CORONARY ARTERY BYPASS GRAFT  1998   x5   CYSTOSCOPY/URETEROSCOPY/HOLMIUM LASER/STENT PLACEMENT Right 02/25/2018   Procedure: RIGHT URETEROSCOPY/HOLMIUM LASER/ STONE REMOVAL  STENT PLACEMENT;  Surgeon: Ardis Hughs, MD;  [facility];   Service: Urology;  Laterality: Right;   ERCP N/A 06/22/2019   Procedure: ENDOSCOPIC RETROGRADE CHOLANGIOPANCREATOGRAPHY (ERCP);  Surgeon: Clarene Essex, MD;  Location: [facility];  Service: Endoscopy;  Laterality: N/A;   EYE SURGERY     cataract and glaucome   LUMBAR LAMINECTOMY/DECOMPRESSION MICRODISCECTOMY N/A 10/19/2015   Procedure: Lumbar three to four LAMINECTOMY/FORAMINOTOMY;  Surgeon: Erline Levine, MD;  Location: [place];  Service: Neurosurgery;  Laterality: N/A;   LUNG SURGERY  yrs ago   right and left lungs - "had air pockets"  birth defect   REMOVAL OF STONES  06/22/2019   Procedure: REMOVAL OF STONES;  Surgeon: Clarene Essex, MD;  [facility];  Service: Endoscopy;;   SPHINCTEROTOMY  06/22/2019   Procedure: Joan Mayans;  Surgeon: Clarene Essex, MD;  [facility];  Service: Endoscopy;;    Allergies  Allergies  Allergen Reactions   Other Rash and Other (See Comments)    CHG soap and wipes patient states today(06/21/19), he does not recall this allergy. He completed the pre-op chg baths without any problems.    History of Present Illness    81 year old male with the above past medical history including CAD s/p PCI, CABG x 3 in 1998, ICM, chronic systolic heart failure, hypertension, RBBB, hypertension, hyperlipidemia, mitral valve regurgitation, aortic valve regurgitation, CKD with atrophic L kidney, and OSA on CPAP.   Cardiac catheterization in 2010 showed patent grafts.  Myoview in 08/2016 showed EF 36%, defect of moderate severity in the apical anterior, apical  septal, and apex location, not reversible and consistent with prior infarct, no ischemia was noted.  Most recent echocardiogram in December 2021 showed EF 45 to 50%, mildly decreased LV function, mild mitral valve regurgitation, mild aortic valve regurgitation, mild aortic valve sclerosis with no evidence of aortic valve stenosis.  He seen in the office on 07/2020 and was noted to have lower extremity edema.  Torsemide was increased to 20 mg bid. He was last seen in the office on 09/05/2021 and reported ongoing bilateral lower extremity edema, right lower extremity cellulitis.  Torsemide was increased to 40 mg twice daily x3 days followed by torsemide 60 mg daily (40 mg in am and 20 mg in pm).   He presents today for follow-up accompanied by his wife.  Since his last visit, has been stable overall from a cardiac standpoint though he reports no real improvement in his lower extremity edema.  His weight is up 4 pounds since his last visit.  He denies worsening dyspnea, however, his activity tolerance is overall significantly decreased.  He reports generalized weakness, he denies chest pain, PND, orthopnea.  BP is low in office today, he denies dizziness, presyncope, syncope.  His wife is concerned that his swelling has not improved with increased oral diuretic.  Other than his ongoing bilateral lower extremity edema, generalized weakness and fatigue, he denies any additional concerns today.  Home Medications    Current Outpatient Medications  Medication Sig Dispense Refill   Ascorbic Acid (VITAMIN C) 1000 MG tablet Take 1,000 mg by mouth every evening.      aspirin EC 81 MG tablet Take 1 tablet (81 mg total) by mouth daily. Swallow whole. 90 tablet 3   atenolol (TENORMIN) 50 MG tablet Take 1.5 tablets (75 mg total) by mouth daily. 135 tablet 3   BEE POLLEN PO Take 3 capsules by mouth every evening.      pantoprazole (PROTONIX) 40 MG tablet Take 1 tablet by mouth daily.     spironolactone (ALDACTONE) 50 MG tablet Take 50 mg by mouth daily.     torsemide (DEMADEX) 20 MG tablet TAKE 1 TABLET BY MOUTH IN THE  MORNING AND 1 TABLET BY MOUTH IN THE AFTERNOON AROUND 3 PM OR 4  PM (Patient taking differently: TAKE 40 MG TWICE DAILY FOR 3 DAYS. THEN TAKE 40 MG IN THE MORNING AND 20 MG AT NOON) 30 tablet 0   torsemide (DEMADEX) 20 MG tablet TAKE 40 MG TWICE DAILY FOR 3 DAYS. THEN TAKE 40 MG IN THE MORNING AND 20 MG AT  NOON. 360 tablet 3   vitamin B-12 (CYANOCOBALAMIN) 100 MCG tablet Take 100 mcg by mouth every evening.      No current facility-administered medications for this visit.     Review of Systems    He denies chest pain, palpitations, pnd, orthopnea, n, v, dizziness, syncope, or early satiety. All other systems reviewed and are otherwise negative except as noted above.   Physical Exam    VS:  BP (!) 85/55   Pulse 73   Ht '6\' 1"'$  (1.854 m)   Wt 266 lb 9.6 oz (120.9 kg)   SpO2 93%   BMI 35.17 kg/m   GEN: Jaundiced, well nourished, well developed, in no acute distress. HEENT: normal. Neck: Supple, no JVD, carotid bruits, or masses. Cardiac: RRR, no murmurs, rubs, or gallops. No clubbing, cyanosis, 2-3+ pitting bilateral lower extremity edema.  Radials/DP/PT 2+ and equal bilaterally.  Respiratory:  Respirations regular and unlabored, fine crackles to  LLL.  GI: Soft, nontender, nondistended, BS + x 4. MS: no deformity or atrophy. Skin: warm and dry, no rash. Neuro:  Strength and sensation are intact. Psych: Normal affect.  Accessory Clinical Findings    ECG personally reviewed by me today - No EKG in office today.     Lab Results  Component Value Date   WBC 5.7 04/16/2020   HGB 12.4 (L) 04/16/2020   HCT 38.0 04/16/2020   MCV 88 04/16/2020   PLT 175 04/16/2020   Lab Results  Component Value Date   CREATININE 1.21 09/13/2021   BUN 23 09/13/2021   NA 133 (L) 09/13/2021   K 4.2 09/13/2021   CL 93 (L) 09/13/2021   CO2 25 09/13/2021   Lab Results  Component Value Date   ALT 18 04/16/2020   AST 47 (H) 04/16/2020   ALKPHOS 178 (H) 04/16/2020   BILITOT 1.0 04/16/2020   Lab Results  Component Value Date   CHOL 144 10/28/2016   HDL 64 10/28/2016   LDLCALC 63 10/28/2016   TRIG 87 10/28/2016   CHOLHDL 2.3 10/28/2016    No results found for: "HGBA1C"  Assessment & Plan    1. Bilateral lower extremity edema/h/o cardiomyopathy/acute on chronic systolic heart failure:  Most  recent echo in December 2021 showed EF 45 to 50%, mildly decreased LV function, mild mitral valve regurgitation, mild aortic valve regurgitation, mild aortic valve sclerosis with no evidence of aortic valve stenosis.  Torsemide was increased at last OV (2 weeks ago).  Creatinine was stable at 1.21.  Since his last visit, he has seen no improvement in his bilateral lower extremity edema.  He notes generalized weakness, fatigue, denies worsening dyspnea, PND, orthopnea.  Weight is up 4 pounds.  He does have fine crackles noted in his LLL.  BP is low (85/55), he denies any dizziness, presyncope, syncope.  Discussed with Dr. Ellyn Hack, DOD, who recommends ED evaluation (will likely require admission) given need for additional diuresis, hypotension, failure to respond to increased oral diuretic.  Recommend repeat echocardiogram.  Patient and wife verbalized understanding.  Patient prefers to go to [facility] ED.  For now, continue atenolol, torsemide, and spironolactone.   2. CAD: S/p CABG x 3 in 1998, cath in 2010 showed patent grafts. Myoview in 08/2016 was negative for ischemia, EF 36%. Stable with no anginal symptoms. Continue aspirin, atenolol, spironolactone, torsemide. No longer on statin in the setting of nonalcoholic cirrhosis.   3. Valvular heart disease: Most recent echo as above (mild MR, mild AI).  Recommend repeat echocardiogram.   4. Hypertension: BP is low. He appears to be asymptomatic, however, caution with need for diuresis. He will likely require de-escalation of antihypertensive regimen.   5. Hyperlipidemia: No recent LDL on file. No longer on statin due to non-alcoholic cirrhosis.  Continue aspirin.   6. OSA: Adherent to CPAP.   7. Non-alcoholic cirrhosis: Following with GI.    8. Disposition: Follow-up in 1 month, sooner if needed.      Lenna Sciara, NP 09/18/2021, 3:11 PM

## 2021-09-18 NOTE — Assessment & Plan Note (Signed)
KI with creatinine of 1.35 up from 1.21 a week ago. Likely due to increasing diuresis.  -avoid nephrotoxic agent

## 2021-09-18 NOTE — ED Provider Notes (Signed)
[place] DEPT Provider Note  CSN: [id number] Arrival date & time: 09/18/21 1613  Chief Complaint(s) Leg Swelling  HPI Joseph Bryan is a 81 y.o. male with PMH CAD status post CABG, ischemic cardiomyopathy, mitral valve regurg, aortic valve regurg, CKD, OSA who presents emergency department for evaluation of bilateral lower extremity edema.  Patient arrives from cardiology clinic due to failure of outpatient oral diuretics.  He endorses worsening bilateral lower extremity edema and orthopnea.  Denies chest pain, abdominal pain, nausea, vomiting or other systemic symptoms.  Of note, in the office, patient was found to be hypotensive 80/50 but here in the emergency department he has normal blood pressures.   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"   Chronic kidney disease    left kidney nonfuctional due to blood clot   Coronary artery disease    Glaucoma    Heart disease    History of kidney stones    Hyperlipidemia    Hypertension    Myocardial infarction ([place]) 17 yrs ago   Neuromuscular disorder (HCC)    neuropathy feet   Neuropathy    feet   Pneumonia    Renal insufficiency    Sleep apnea with use of continuous positive airway pressure (CPAP)    uses CPAP   Patient Active Problem List   Diagnosis Date Noted   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 06/21/2019   Cholelithiasis with chronic cholecystitis 06/11/2019   Elevated liver enzymes 06/11/2019   Lumbar spinal stenosis 01/16/2017   Hyperlipidemia LDL goal <70 04/30/2016   Elevated LFTs 04/30/2016   Spinal stenosis, lumbar region, with neurogenic claudication 10/19/2015   Morbid obesity ([place]) 12/15/2013   Leg edema 10/08/2012   OSA on CPAP 10/08/2012   RBBB 10/08/2012   History of MI (myocardial infarction) 10/08/2012   Anasarca 09/01/2012   Abdominal pain, acute, right lower quadrant  08/26/2012   HTN (hypertension) 08/26/2012   Obesity, unspecified 08/26/2012   CAD- s/p CABG in 1998 - patent grafts on cath in 2010 08/26/2012   Home Medication(s) Prior to Admission medications   Medication Sig Start Date End Date Taking? Authorizing Provider  Ascorbic Acid (VITAMIN C) 1000 MG tablet Take 1,000 mg by mouth every evening.     [provider]  aspirin EC 81 MG tablet Take 1 tablet (81 mg total) by mouth daily. Swallow whole. 12/15/19   Almyra Deforest, PA  atenolol (TENORMIN) 50 MG tablet Take 1.5 tablets (75 mg total) by mouth daily. 08/07/20   Troy Sine, MD  BEE POLLEN PO Take 3 capsules by mouth every evening.     [provider]  pantoprazole (PROTONIX) 40 MG tablet Take 1 tablet by mouth daily.    [provider]  spironolactone (ALDACTONE) 50 MG tablet Take 50 mg by mouth daily. 12/14/19   [provider]  torsemide (DEMADEX) 20 MG tablet TAKE 1 TABLET BY MOUTH IN THE  MORNING AND 1 TABLET BY MOUTH IN THE AFTERNOON AROUND 3 PM OR 4  PM Patient taking differently: TAKE 40 MG TWICE DAILY FOR 3 DAYS. THEN TAKE 40 MG IN THE MORNING AND 20 MG AT NOON 07/25/21   Troy Sine, MD  torsemide (DEMADEX) 20 MG tablet TAKE 40 MG TWICE DAILY FOR 3 DAYS. THEN TAKE 40 MG IN THE MORNING AND 20 MG AT NOON. 09/05/21   Lenna Sciara, NP  vitamin  B-12 (CYANOCOBALAMIN) 100 MCG tablet Take 100 mcg by mouth every evening.     [provider]                                                                                                                                    Past Surgical History Past Surgical History:  Procedure Laterality Date   APPENDECTOMY  2014   CHOLECYSTECTOMY N/A 06/21/2019   Procedure: LAPAROSCOPIC CHOLECYSTECTOMY WITH INTRAOPERATIVE CHOLANGIOGRAM;  Surgeon: Armandina Gemma, MD;  [facility];  Service: General;  Laterality: N/A;   COLON RESECTION N/A 08/27/2012   Procedure: Diagnostic laparoscopy; Ileocecectomy;  Surgeon: Madilyn Hook, DO;  [facility];  Service: General;  Laterality: N/A;   COLONOSCOPY WITH PROPOFOL N/A 04/09/2015   Procedure: COLONOSCOPY WITH PROPOFOL;  Surgeon: Garlan Fair, MD;  [facility];  Service: Endoscopy;  Laterality: N/A;   CORONARY ARTERY BYPASS GRAFT  1998   x5   CYSTOSCOPY/URETEROSCOPY/HOLMIUM LASER/STENT PLACEMENT Right 02/25/2018   Procedure: RIGHT URETEROSCOPY/HOLMIUM LASER/ STONE REMOVAL  STENT PLACEMENT;  Surgeon: Ardis Hughs, MD;  [facility];  Service: Urology;  Laterality: Right;   ERCP N/A 06/22/2019   Procedure: ENDOSCOPIC RETROGRADE CHOLANGIOPANCREATOGRAPHY (ERCP);  Surgeon: Clarene Essex, MD;  Location: [facility];  Service: Endoscopy;  Laterality: N/A;   EYE SURGERY     cataract and glaucome   LUMBAR LAMINECTOMY/DECOMPRESSION MICRODISCECTOMY N/A 10/19/2015   Procedure: Lumbar three to four LAMINECTOMY/FORAMINOTOMY;  Surgeon: Erline Levine, MD;  Location: [place];  Service: Neurosurgery;  Laterality: N/A;   LUNG SURGERY  yrs ago   right and left lungs - "had air pockets"  birth defect   REMOVAL OF STONES  06/22/2019   Procedure: REMOVAL OF STONES;  Surgeon: Clarene Essex, MD;  [facility];  Service: Endoscopy;;   SPHINCTEROTOMY  06/22/2019   Procedure: Joan Mayans;  Surgeon: Clarene Essex, MD;  [facility];  Service: Endoscopy;;   Family History Family History  Problem Relation Age of Onset   Hypertension Father    Hypertension Sister    Heart attack Brother     Social History Social History   Tobacco Use   Smoking status: Former    Types: Cigarettes    Quit date: 08/25/1988    Years since quitting: 33.0   Smokeless tobacco: Never  Vaping Use   Vaping Use: Never used  Substance Use Topics   Alcohol use: No    Alcohol/week: 0.0 standard drinks of alcohol   Drug use: No   Allergies Other  Review of Systems Review of Systems  Cardiovascular:  Positive for leg swelling.    Physical Exam Vital Signs  I have  reviewed the triage vital signs BP (!) 132/113 (BP Location: Right Arm)   Pulse 75   Temp 99.1 F (37.3 C) (Oral)   Resp 16   SpO2 94%   Physical Exam Constitutional:      General:  He is not in acute distress.    Appearance: Normal appearance.  HENT:     Head: Normocephalic and atraumatic.     Nose: No congestion or rhinorrhea.  Eyes:     General:        Right eye: No discharge.        Left eye: No discharge.     Extraocular Movements: Extraocular movements intact.     Pupils: Pupils are equal, round, and reactive to light.  Cardiovascular:     Rate and Rhythm: Normal rate and regular rhythm.     Heart sounds: No murmur heard. Pulmonary:     Effort: No respiratory distress.     Breath sounds: Rales present. No wheezing.  Abdominal:     General: There is no distension.     Tenderness: There is no abdominal tenderness.  Musculoskeletal:        General: Normal range of motion.     Cervical back: Normal range of motion.     Right lower leg: Edema present.     Left lower leg: Edema present.  Skin:    General: Skin is warm and dry.  Neurological:     General: No focal deficit present.     Mental Status: He is alert.     ED Results and Treatments Labs (all labs ordered are listed, but only abnormal results are displayed) Labs Reviewed  CBC WITH DIFFERENTIAL/PLATELET - Abnormal; Notable for the following components:      Result Value   WBC 16.1 (*)    RBC 3.49 (*)    Hemoglobin 11.5 (*)    HCT 34.7 (*)    RDW 17.0 (*)    Platelets 125 (*)    Neutro Abs 12.9 (*)    Monocytes Absolute 1.8 (*)    Abs Immature Granulocytes 0.11 (*)    All other components within normal limits  BASIC METABOLIC PANEL - Abnormal; Notable for the following components:   Sodium 131 (*)    Chloride 97 (*)    Glucose, Bld 108 (*)    BUN 28 (*)    Creatinine, Ser 1.35 (*)    Calcium 8.4 (*)    GFR, Estimated 53 (*)    All other components within normal limits  BRAIN NATRIURETIC PEPTIDE  - Abnormal; Notable for the following components:   B Natriuretic Peptide 388.3 (*)    All other components within normal limits                                                                                                                          Radiology DG Chest 2 View  Result Date: 09/18/2021 CLINICAL DATA:  Cough.  Bilateral leg swelling. EXAM: CHEST - 2 VIEW COMPARISON:  Chest radiographs 02/04/2018 FINDINGS: The cardiac silhouette is partially obscured but appears borderline to mildly enlarged. Prior CABG and aortic atherosclerosis are noted. Lung volumes are diminished with increased interstitial densities bilaterally as well as more confluent, hazy opacity  in the right upper lobe. Biapical pleural thickening is noted. No sizable pleural effusion or pneumothorax is identified. No acute osseous abnormality is seen. IMPRESSION: Low lung volumes with bilateral interstitial and hazy right upper lobe opacities which may reflect pneumonia or edema. Electronically Signed   By: Logan Bores M.D.   On: 09/18/2021 16:55    Pertinent labs & imaging results that were available during my care of the patient were reviewed by me and considered in my medical decision making (see MDM for details).  Medications Ordered in ED Medications - No data to display                                                                                                                                   Procedures Procedures  (including critical care time)  Medical Decision Making / ED Course   This patient presents to the ED for concern of lower extremity edema, abnormal lung exam, this involves an extensive number of treatment options, and is a complaint that carries with it a high risk of complications and morbidity.  The differential diagnosis includes CHF exacerbation, failure of outpatient diuretics, and pneumonia, ACS, PE  MDM: Seen emergency room for evaluation of multiple complaints described above.   Physical exam with significant bilateral lower extremity edema and rales in the right lower lobe.  Laboratory evaluation with a leukocytosis to 16.1, hemoglobin 11.5, BUN 28, creatinine 1.35, BNP 388.  X-ray concerning for new consolidation versus pulmonary edema. I did speak with cardiology who believes the patient's symptoms are likely infectious and would like to follow-up on CT imaging.  CT PE showing a new cavitary lesion in the right lower lobe and the patient was started on broad-spectrum antibiotics.   patient then admitted.   Additional history obtained: -Additional history obtained from wife -External records from outside source obtained and reviewed including: Chart review including previous notes, labs, imaging, consultation notes   Lab Tests: -I ordered, reviewed, and interpreted labs.   The pertinent results include:   Labs Reviewed  CBC WITH DIFFERENTIAL/PLATELET - Abnormal; Notable for the following components:      Result Value   WBC 16.1 (*)    RBC 3.49 (*)    Hemoglobin 11.5 (*)    HCT 34.7 (*)    RDW 17.0 (*)    Platelets 125 (*)    Neutro Abs 12.9 (*)    Monocytes Absolute 1.8 (*)    Abs Immature Granulocytes 0.11 (*)    All other components within normal limits  BASIC METABOLIC PANEL - Abnormal; Notable for the following components:   Sodium 131 (*)    Chloride 97 (*)    Glucose, Bld 108 (*)    BUN 28 (*)    Creatinine, Ser 1.35 (*)    Calcium 8.4 (*)    GFR, Estimated 53 (*)    All other components within normal limits  BRAIN NATRIURETIC PEPTIDE -  Abnormal; Notable for the following components:   B Natriuretic Peptide 388.3 (*)    All other components within normal limits      EKG   EKG Interpretation  Date/Time:  Wednesday September 18 2021 16:23:34 EDT Ventricular Rate:  73 PR Interval:  173 QRS Duration: 151 QT Interval:  439 QTC Calculation: 484 R Axis:   -45 Text Interpretation: Sinus arrhythmia RBBB and LAFB Confirmed by Wiley Ford  ([id number]) on 09/18/2021 7:28:21 PM         Imaging Studies ordered: I ordered imaging studies including chest x-ray, CT PE I independently visualized and interpreted imaging. I agree with the radiologist interpretation   Medicines ordered and prescription drug management: No orders of the defined types were placed in this encounter.   -I have reviewed the patients home medicines and have made adjustments as needed  Critical interventions none  Consultations Obtained: I requested consultation with the cardiologist on-call,  and discussed lab and imaging findings as well as pertinent plan - they recommend: Follow-up on CT imaging and caution around aggressive diuresis   Cardiac Monitoring: The patient was maintained on a cardiac monitor.  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"   Chronic kidney disease    left kidney nonfuctional due to blood clot   Coronary artery disease    Glaucoma    Heart disease    History of kidney stones    Hyperlipidemia    Hypertension    Myocardial infarction ([place]) 17 yrs ago   Neuromuscular disorder (HCC)    neuropathy feet   Neuropathy    feet   Pneumonia    Renal insufficiency    Sleep apnea with use of continuous positive airway pressure (CPAP)    uses CPAP      Dispostion: I considered admission for this patient, and due to new cavitary pneumonia, patient require hospital admission     Final Clinical Impression(s) / ED Diagnoses Final diagnoses:  None     '@PCDICTATION'$ @    Teressa Lower, MD 09/18/21 2321

## 2021-09-18 NOTE — Patient Instructions (Signed)
Medication Instructions:  Your physician recommends that you continue on your current medications as directed. Please refer to the Current Medication list given to you today.  *If you need a refill on your cardiac medications before your next appointment, please call your pharmacy*   Lab Work: NONE If you have labs (blood work) drawn today and your tests are completely normal, you will receive your results only by: [place] (if you have MyChart) OR A paper copy in the mail If you have any lab test that is abnormal or we need to change your treatment, we will call you to review the results.   Testing/Procedures: NONE   Follow-Up: At [facility], you and your health needs are our priority.  As part of our continuing mission to provide you with exceptional heart care, we have created designated Provider Care Teams.  These Care Teams include your primary Cardiologist (physician) and Advanced Practice Providers (APPs -  Physician Assistants and Nurse Practitioners) who all work together to provide you with the care you need, when you need it.  We recommend signing up for the patient portal called "MyChart".  Sign up information is provided on this After Visit Summary.  MyChart is used to connect with patients for Virtual Visits (Telemedicine).  Patients are able to view lab/test results, encounter notes, upcoming appointments, etc.  Non-urgent messages can be sent to your provider as well.   To learn more about what you can do with MyChart, go to [email or web address].    Your next appointment:   1 month(s)  The format for your next appointment:   In Person  Provider:   Diona Browner, NP       Other Instructions PLEASE GO DIRECTLY TO [place] !  Important Information About [place]

## 2021-09-18 NOTE — ED Notes (Addendum)
Report given to nurse, Janann August RN. JRPRN

## 2021-09-18 NOTE — ED Triage Notes (Signed)
Pt presents with c/o bilateral leg swelling. Pt reports his MD sent him over for the increased swelling and because she believes she heard a rattle in his lung. Pt denies any complaints but does have some significant swelling to his legs.

## 2021-09-18 NOTE — Assessment & Plan Note (Signed)
Likely reactive. Continue to monitor.

## 2021-09-19 ENCOUNTER — Encounter (HOSPITAL_COMMUNITY): Payer: Medicare Other

## 2021-09-19 ENCOUNTER — Inpatient Hospital Stay (HOSPITAL_COMMUNITY): Payer: Medicare Other

## 2021-09-19 ENCOUNTER — Telehealth: Payer: Self-pay | Admitting: Pulmonary Disease

## 2021-09-19 DIAGNOSIS — I5020 Unspecified systolic (congestive) heart failure: Secondary | ICD-10-CM | POA: Diagnosis not present

## 2021-09-19 DIAGNOSIS — R600 Localized edema: Secondary | ICD-10-CM

## 2021-09-19 DIAGNOSIS — I2510 Atherosclerotic heart disease of native coronary artery without angina pectoris: Secondary | ICD-10-CM | POA: Diagnosis not present

## 2021-09-19 DIAGNOSIS — I5022 Chronic systolic (congestive) heart failure: Secondary | ICD-10-CM | POA: Diagnosis not present

## 2021-09-19 DIAGNOSIS — I959 Hypotension, unspecified: Secondary | ICD-10-CM

## 2021-09-19 DIAGNOSIS — J189 Pneumonia, unspecified organism: Secondary | ICD-10-CM | POA: Diagnosis not present

## 2021-09-19 LAB — ECHOCARDIOGRAM COMPLETE
AR max vel: 3.25 cm2
AV Peak grad: 6.9 mmHg
Ao pk vel: 1.31 m/s
Area-P 1/2: 4.39 cm2
Height: 73 in
P 1/2 time: 343 msec
S' Lateral: 4.5 cm
Weight: 4176.39 oz

## 2021-09-19 LAB — COMPREHENSIVE METABOLIC PANEL
ALT: 21 U/L (ref 0–44)
AST: 38 U/L (ref 15–41)
Albumin: 1.9 g/dL — ABNORMAL LOW (ref 3.5–5.0)
Alkaline Phosphatase: 101 U/L (ref 38–126)
Anion gap: 8 (ref 5–15)
BUN: 29 mg/dL — ABNORMAL HIGH (ref 8–23)
CO2: 25 mmol/L (ref 22–32)
Calcium: 8.2 mg/dL — ABNORMAL LOW (ref 8.9–10.3)
Chloride: 98 mmol/L (ref 98–111)
Creatinine, Ser: 1.24 mg/dL (ref 0.61–1.24)
GFR, Estimated: 58 mL/min — ABNORMAL LOW (ref >60)
Glucose, Bld: 98 mg/dL (ref 70–99)
Potassium: 3.5 mmol/L (ref 3.5–5.1)
Sodium: 131 mmol/L — ABNORMAL LOW (ref 135–145)
Total Bilirubin: 3.2 mg/dL — ABNORMAL HIGH (ref 0.3–1.2)
Total Protein: 5.9 g/dL — ABNORMAL LOW (ref 6.5–8.1)

## 2021-09-19 LAB — CBC
HCT: 32.6 % — ABNORMAL LOW (ref 39.0–52.0)
Hemoglobin: 10.6 g/dL — ABNORMAL LOW (ref 13.0–17.0)
MCH: 32.5 pg (ref 26.0–34.0)
MCHC: 32.5 g/dL (ref 30.0–36.0)
MCV: 100 fL (ref 80.0–100.0)
Platelets: 108 10*3/uL — ABNORMAL LOW (ref 150–400)
RBC: 3.26 MIL/uL — ABNORMAL LOW (ref 4.22–5.81)
RDW: 17 % — ABNORMAL HIGH (ref 11.5–15.5)
WBC: 13.1 10*3/uL — ABNORMAL HIGH (ref 4.0–10.5)
nRBC: 0 % (ref 0.0–0.2)

## 2021-09-19 MED ORDER — PERFLUTREN LIPID MICROSPHERE
1.0000 mL | INTRAVENOUS | Status: AC | PRN
  Administered 2021-09-19: 6 mL via INTRAVENOUS

## 2021-09-19 MED ORDER — VANCOMYCIN HCL 2000 MG/400ML IV SOLN: 2000.0000 mg | INTRAVENOUS | Status: DC

## 2021-09-19 MED ORDER — SODIUM CHLORIDE 0.9 % IV SOLN: 2.0000 g | Freq: Two times a day (BID) | INTRAVENOUS | Status: DC

## 2021-09-19 MED ORDER — AMOXICILLIN-POT CLAVULANATE 875-125 MG PO TABS
1.0000 | ORAL_TABLET | Freq: Two times a day (BID) | ORAL | Status: DC
  Administered 2021-09-19 – 2021-09-21 (×5): 1 via ORAL
  Filled 2021-09-19 (×5): qty 1

## 2021-09-19 MED ORDER — MIDODRINE HCL 5 MG PO TABS
10.0000 mg | ORAL_TABLET | Freq: Three times a day (TID) | ORAL | Status: DC
  Administered 2021-09-19 – 2021-09-21 (×7): 10 mg via ORAL
  Filled 2021-09-19 (×8): qty 2

## 2021-09-19 NOTE — Assessment & Plan Note (Signed)
BMI of 35 

## 2021-09-19 NOTE — Consult Note (Addendum)
Cardiology Consultation   Patient ID: Joseph Bryan MRN: [id number]; DOB: 1940/02/28  Admit date: 09/18/2021 Date of Consult: 09/19/2021  PCP:  Joseph Downing, MD   [place] Providers Cardiologist:  Joseph Majestic, MD        Patient Profile:   Joseph Bryan is a 81 y.o. male with a hx of  CAD s/p PCI, CABG x 3 in 1998, RBBB, ICM, chronic systolic heart failure, nonalcoholic cirrhosis, hypertension, hyperlipidemia, CKD with atrophic Lf kidney, and OSA not on CPAP, who was admitted 09/06 for hypotension and increase LE edema.    Cards asked to see 09/19/2021 for the evaluation of CHF at the request of Joseph Bryan.  History of Present Illness:   Joseph Bryan was last seen by Joseph Bryan 07/2020. He has had problems w/ LE edema over the years, took metolazone at times with improvement. However, BP did not tolerate this well, so not a consistent med. Last July, he was on spiro 50 mg and torsemide 20 mg qd. Wt 247 lbs, 2 +LE edema. Torsemide increased to 20 mg bid.  Joseph Browner, Joseph saw him 08/24, +LE edema, torsemide increased to 40 mg bid x 3 days, then 40 mg am, 20 mg pm. She saw him again 09/06 wt 262>> 266 lbs, 2+ LE edema, no improvement in sx, hypotensive w/ SBP 80s, +crackles >> sent to ER   Wt on admit 261 lbs. Got 40 mg Lasix IV in the ER, because CXR ?edema, but on CT it was PNA, not CHF so Lasix d/c'd and ABX started, Cards asked to see.   Joseph Bryan has had lower extremity edema for years.  He is compliant with medications.  He wears compression stockings during the day, and sleeps with his feet elevated at night.  The metolazone helped some, but the edema has never resolved.  He had right lower extremity cellulitis earlier this year, and that area on his right leg has remained very red.  Steroid creams helped a little, but the redness never goes away.  He cannot tell much change in his breathing since he was admitted, does not really feel any different.  He said he  just could not tolerate the CPAP when it was first prescribed and has never consistently used it.  He has never had UNNA boots.    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   Coronary artery disease    Glaucoma    Heart disease    History of kidney stones    Hyperlipidemia    Hypertension    Myocardial infarction ([place]) 17 yrs ago   Neuromuscular disorder (HCC)    neuropathy feet   Neuropathy    feet   Pneumonia    Renal insufficiency    Sleep apnea with use of continuous positive airway pressure (CPAP)    uses CPAP    Past Surgical History:  Procedure Laterality Date   APPENDECTOMY  2014   CHOLECYSTECTOMY N/A 06/21/2019   Procedure: LAPAROSCOPIC CHOLECYSTECTOMY WITH INTRAOPERATIVE CHOLANGIOGRAM;  Surgeon: Armandina Gemma, MD;  Location: [facility];  Service: General;  Laterality: N/A;   COLON RESECTION N/A 08/27/2012   Procedure: Diagnostic laparoscopy; Ileocecectomy;  Surgeon: Madilyn Hook, DO;  Location: [facility];  Service: General;  Laterality: N/A;   COLONOSCOPY WITH PROPOFOL N/A 04/09/2015   Procedure: COLONOSCOPY WITH PROPOFOL;  Surgeon: Garlan Fair, MD;  Location: [facility];  Service: Endoscopy;  Laterality: N/A;   CORONARY ARTERY BYPASS GRAFT  1998   x5   CYSTOSCOPY/URETEROSCOPY/HOLMIUM LASER/STENT PLACEMENT Right 02/25/2018   Procedure: RIGHT URETEROSCOPY/HOLMIUM LASER/ STONE REMOVAL  STENT PLACEMENT;  Surgeon: Ardis Hughs, MD;  Location: [facility];  Service: Urology;  Laterality: Right;   ERCP N/A 06/22/2019   Procedure: ENDOSCOPIC RETROGRADE CHOLANGIOPANCREATOGRAPHY (ERCP);  Surgeon: Clarene Essex, MD;  Location: [facility];  Service: Endoscopy;  Laterality: N/A;   EYE SURGERY     cataract and glaucome   LUMBAR LAMINECTOMY/DECOMPRESSION MICRODISCECTOMY N/A 10/19/2015   Procedure: Lumbar three to four LAMINECTOMY/FORAMINOTOMY;  Surgeon: Erline Levine, MD;   Location: [place];  Service: Neurosurgery;  Laterality: N/A;   LUNG SURGERY  yrs ago   right and 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 Authorizing Provider  Ascorbic Acid (VITAMIN C) 1000 MG tablet Take 1,000 mg by mouth every evening.     [provider]  aspirin EC 81 MG tablet Take 1 tablet (81 mg total) by mouth daily. Swallow whole. 12/15/19   Almyra Deforest, PA  atenolol (TENORMIN) 50 MG tablet Take 1.5 tablets (75 mg total) by mouth daily. 08/07/20   Troy Sine, MD  BEE POLLEN PO Take 3 capsules by mouth every evening.     [provider]  pantoprazole (PROTONIX) 40 MG tablet Take 1 tablet by mouth daily.    [provider]  spironolactone (ALDACTONE) 50 MG tablet Take 50 mg by mouth daily. 12/14/19   [provider]  torsemide (DEMADEX) 20 MG tablet TAKE 1 TABLET BY MOUTH IN THE  MORNING AND 1 TABLET BY MOUTH IN THE AFTERNOON AROUND 3 PM OR 4  PM Patient taking differently: TAKE 40 MG TWICE DAILY FOR 3 DAYS. THEN TAKE 40 MG IN THE MORNING AND 20 MG AT NOON 07/25/21   Troy Sine, MD  torsemide (DEMADEX) 20 MG tablet TAKE 40 MG TWICE DAILY FOR 3 DAYS. THEN TAKE 40 MG IN THE MORNING AND 20 MG AT NOON. 09/05/21   Lenna Sciara, Joseph  vitamin B-12 (CYANOCOBALAMIN) 100 MCG tablet Take 100 mcg by mouth every evening.     [provider]    Inpatient Medications: Scheduled Meds:  amoxicillin-clavulanate  1 tablet Oral BID   enoxaparin (LOVENOX) injection  40 mg Subcutaneous Q24H   Continuous Infusions:  PRN Meds:   Allergies:    Allergies  Allergen Reactions   Other Rash  and Other (See Comments)    CHG soap and wipes patient states today(06/21/19), he does not recall this allergy. He completed the pre-op chg baths without any problems.    Social History:   Social History   Socioeconomic History   Marital status: Married    Spouse name: Not on file   Number of children: Not on file   Years of education: Not on file   Highest education level: Not on file  Occupational History   Not on file  Tobacco Use   Smoking status: Former    Types: Cigarettes  Quit date: 08/25/1988    Years since quitting: 33.0   Smokeless tobacco: Never  Vaping Use   Vaping Use: Never used  Substance and Sexual Activity   Alcohol use: No    Alcohol/week: 0.0 standard drinks of alcohol   Drug use: No   Sexual activity: Not Currently  Other Topics Concern   Not on file  Social History Narrative   Not on file   Social Determinants of Health   Financial Resource Strain: Not on file  Food Insecurity: Not on file  Transportation Needs: Not on file  Physical Activity: Not on file  Stress: Not on file  Social Connections: Not on file  Intimate Partner Violence: Not on file    Family History:   Family History  Problem Relation Age of Onset   Hypertension Father    Hypertension Sister    Heart attack Brother      ROS:  Please see the history of present illness.  All other ROS reviewed and negative.     Physical Exam/Data:   Vitals:   09/19/21 0000 09/19/21 0123 09/19/21 0335 09/19/21 0800  BP:   107/60 (!) 84/69  Pulse:   68 66  Resp: '16  15 12  '$ Temp:   98.3 F (36.8 C)   TempSrc:   Oral Oral  SpO2:   93%   Weight:  118.4 kg      Intake/Output Summary (Last 24 hours) at 09/19/2021 0923 Last data filed at 09/19/2021 0000 Gross per 24 hour  Intake 120 ml  Output --  Net 120 ml      09/19/2021    1:23 AM 09/18/2021    2:11 PM 09/05/2021    3:15 PM  Last 3 Weights  Weight (lbs) 261 lb 0.4 oz 266 lb 9.6 oz 262 lb 9.6 oz  Weight (kg) 118.4 kg 120.929 kg  119.115 kg     Body mass index is 34.44 kg/m.  General:  Well nourished, well developed, in no acute distress HEENT: normal Neck: Minimal JVD Vascular: No carotid bruits; Distal radial pulses 2+ bilaterally Cardiac:  normal S1, S2; slightly irregular rate and rhythm, no murmur  Lungs: Rales R>L, no wheezing, rhonchi    Abd: soft, nontender, no hepatomegaly  Ext: 2+ LE edema Musculoskeletal:  No deformities, BUE and BLE strength normal and equal Skin: warm and dry; skin on his legs is thickened and he has reddened areas, right greater than left in the tibial area Neuro:  CNs 2-12 intact, no focal abnormalities noted Psych:  Normal affect   EKG:  The EKG was personally reviewed and demonstrates:  SR, HR 75, RBBB, LAFB, PACs Telemetry:  Telemetry was personally reviewed and demonstrates:  SR, PACs; there are a couple of areas where it is listed as atrial fibrillation, but I believe this is artifact  Relevant CV Studies:  ECHO: SCHEDULED  ECHO: 2020-01-20  1. Left ventricular ejection fraction, by estimation, is 45 to 50%. The  left ventricle has mildly decreased function. The left ventricle  demonstrates global hypokinesis. Left ventricular diastolic function could  not be evaluated.   2. Right ventricular systolic function was not well visualized. The right  ventricular size is normal. Tricuspid regurgitation signal is inadequate  for assessing PA pressure.   3. The mitral valve is degenerative. Mild mitral valve regurgitation. No  evidence of mitral stenosis.   4. The aortic valve is tricuspid. Aortic valve regurgitation is mild.  Mild aortic valve sclerosis is present, with no evidence  of aortic valve  stenosis.   MYOVIEW: 08/20/2016 The left ventricular ejection fraction is moderately decreased (30-44%). Nuclear stress EF: 36%. There was no ST segment deviation noted during stress. There is a medium defect of moderate severity present in the apical anterior, apical septal  and apex location. The defect is non-reversible. This is consistent with infarct. No ischemia noted. This is an intermediate risk study. There is evidence of transient ischemic dilatation with a TID of 1.27 which can be seen in multivessel CAD.  Laboratory Data:  High Sensitivity Troponin:  No results for input(s): "TROPONINIHS" in the last 720 hours.   Chemistry Recent Labs  Lab 09/13/21 1432 09/18/21 1626 09/19/21 0429  NA 133* 131* 131*  K 4.2 4.4 3.5  CL 93* 97* 98  CO2 '25 26 25  '$ GLUCOSE 102* 108* 98  BUN 23 28* 29*  CREATININE 1.21 1.35* 1.24  CALCIUM 8.8 8.4* 8.2*  GFRNONAA  --  53* 58*  ANIONGAP  --  8 8    Recent Labs  Lab 09/19/21 0429  PROT 5.9*  ALBUMIN 1.9*  AST 38  ALT 21  ALKPHOS 101  BILITOT 3.2*   Lipids No results for input(s): "CHOL", "TRIG", "HDL", "LABVLDL", "LDLCALC", "CHOLHDL" in the last 168 hours.  Hematology Recent Labs  Lab 09/18/21 1626 09/19/21 0429  WBC 16.1* 13.1*  RBC 3.49* 3.26*  HGB 11.5* 10.6*  HCT 34.7* 32.6*  MCV 99.4 100.0  MCH 33.0 32.5  MCHC 33.1 32.5  RDW 17.0* 17.0*  PLT 125* 108*   Thyroid No results for input(s): "TSH", "FREET4" in the last 168 hours.  BNP Recent Labs  Lab 09/18/21 1626  BNP 388.3*    DDimer No results for input(s): "DDIMER" in the last 168 hours.   Radiology/Studies:  CT Angio Chest PE W and/or Wo Contrast  Result Date: 09/18/2021 CLINICAL DATA:  Concern for pulmonary embolism. EXAM: CT ANGIOGRAPHY CHEST WITH CONTRAST TECHNIQUE: Multidetector CT imaging of the chest was performed using the standard protocol during bolus administration of intravenous contrast. Multiplanar CT image reconstructions and MIPs were obtained to evaluate the vascular anatomy. RADIATION DOSE REDUCTION: This exam was performed according to the departmental dose-optimization program which includes automated exposure control, adjustment of the mA and/or kV according to patient size and/or use of iterative reconstruction  technique. CONTRAST:  153m OMNIPAQUE IOHEXOL 350 MG/ML SOLN COMPARISON:  Chest radiograph dated 09/18/2021. FINDINGS: Evaluation of this exam is limited due to respiratory motion artifact. Cardiovascular: There is no cardiomegaly or pericardial effusion. There is 3 vessel coronary vascular calcification and postsurgical changes of CABG. There is moderate atherosclerotic calcification of the thoracic aorta. No aneurysmal dilatation. No pulmonary artery embolus identified. Mediastinum/Nodes: Mildly enlarged right hilar lymph node measures 11 mm short axis. No mediastinal adenopathy. The esophagus is grossly unremarkable. No mediastinal fluid collection. Lungs/Pleura: Background of predominantly paraseptal emphysema with right upper lobe subpleural scarring. Patchy area of ground-glass density in the right upper lobe consistent with pneumonia. There is a 2 cm central cavitary component noted concerning for a cavitary infection clinical correlation is recommended. Several additional small scattered ground-glass nodules predominantly in the superior segment of the lower lobes bilaterally suspicious for additional foci of infection. There is no pleural effusion or pneumothorax. The central airways are patent. Small mucous secretion noted in the right mainstem bronchus. Upper Abdomen: Small ascites.  Cholecystectomy.  Cirrhosis. Musculoskeletal: Osteopenia with degenerative changes of the spine. Median sternotomy wires. No acute osseous pathology. Review of the MIP images confirms  the above findings. IMPRESSION: 1. No CT evidence of pulmonary artery embolus. 2. Right upper lobe cavitary pneumonia. 3. Mildly enlarged right hilar lymph node, likely reactive. 4. Cirrhosis with small ascites. 5. Aortic Atherosclerosis (ICD10-I70.0) and Emphysema (ICD10-J43.9). Electronically Signed   By: Anner Crete M.D.   On: 09/18/2021 22:01   DG Chest 2 View  Result Date: 09/18/2021 CLINICAL DATA:  Cough.  Bilateral leg swelling.  EXAM: CHEST - 2 VIEW COMPARISON:  Chest radiographs 02/04/2018 FINDINGS: The cardiac silhouette is partially obscured but appears borderline to mildly enlarged. Prior CABG and aortic atherosclerosis are noted. Lung volumes are diminished with increased interstitial densities bilaterally as well as more confluent, hazy opacity in the right upper lobe. Biapical pleural thickening is noted. No sizable pleural effusion or pneumothorax is identified. No acute osseous abnormality is seen. IMPRESSION: Low lung volumes with bilateral interstitial and hazy right upper lobe opacities which may reflect pneumonia or edema. Electronically Signed   By: Logan Bores M.D.   On: 09/18/2021 16:55     Assessment and Plan:   CHF: -EF previously 45-50%, unable to evaluate diastolic dysfunction -Repeat echo scheduled for today, follow-up on results -Based on the CT, he does not have fluid in his lungs -His lower extremity edema is chronic, there may be some venous insufficiency related to that or the cirrhosis may play a role -However, he only had a small amount of ascites a year ago -he needs lower extremity venous Dopplers to assess for venous insufficiency, could do DVT study or refer to [facility] -He is compliant with compression socks during the day and keeping his feet up at night -His BUN is up above baseline, creatinine has improved overnight -He got 1 dose of IV Lasix last night, is not currently on a diuretic -Discuss with MD restarting his home diuretic regimen -Continue to track daily weights and strict intake/output  2.  CAD: -He is not having any ischemic symptoms. -Prior to admission, he was on aspirin 81 mg daily, atenolol 50 mg daily, spironolactone and Demadex -All medications that affect his blood pressure are on hold  3.  Hypotension: -SBP is still in the 80s and 90s most of the time.  4.  OSA not on CPAP -Patient said he just could not tolerate the machine -I will order it while he is in the  hospital, perhaps he can work with the respiratory therapist to come up with a mask and usage that he can tolerate.   Principal Problem:   Hypotension Active Problems:   Obesity, unspecified   CAD- s/p CABG in 1998 - patent grafts on cath in 2010   OSA on CPAP   AKI (acute kidney injury) ([place])   Thrombocytopenia (HCC)   Cavitary pneumonia   Lower extremity edema   HFrEF (heart failure with reduced ejection fraction) ([place]) Otherwise, per IM    Risk Assessment/Risk Scores:       New York Heart Association (NYHA) Functional Class NYHA Class III   For questions or updates, please contact [place] Please consult [email or web address] for contact info under    Signed, Rosaria Ferries, PA-C  09/19/2021 9:23 AM  Personally seen and examined. Agree with above.  81 year old with longstanding history of chronic lower extremity edema treated with torsemide, occasional metolazone as an outpatient with chronic systolic heart failure ejection fraction most recently mildly reduced at 45 to 50%  Serum creatinine currently 1.24 serum sodium 131 potassium 3.5  Pleasant alert and oriented 2+ bilateral edema noted  Assessment and plan:  Chronic systolic heart failure - Likely a degree of diastolic heart failure as well at baseline with a EF of 45 to 50%.  No evidence of interstitial edema on CT.  Lower extremity edema has been chronic most likely secondary to venous insufficiency/dependent edema.  Continue with compression socks elevation. -Would suggest referral to [facility] as outpatient to see if there is any further therapy for his chronic edema. -Would continue to hold his home regimen as his blood pressures are soft.  Challenging situation.  Candee Furbish, MD

## 2021-09-19 NOTE — Assessment & Plan Note (Addendum)
No recent travel and low risk for TB. Not hypoxic.  -continue IV vancomycin and cefepime -obtain sputum culture

## 2021-09-19 NOTE — Assessment & Plan Note (Signed)
Pt has been nonadherent

## 2021-09-19 NOTE — Telephone Encounter (Signed)
Please schedule hospital follow-up in 4 weeks, 30-minute slot, following completion of CT scan chest which is ordered.

## 2021-09-19 NOTE — Consult Note (Signed)
NAME:  Joseph Bryan, MRN:  [id number], DOB:  27-Dec-1940, LOS: 1 ADMISSION DATE:  09/18/2021, CONSULTATION DATE:  09/19/21 REFERRING MD:  Starla Link MD [facility], CHIEF COMPLAINT:  lower extremity    History of Present Illness:  81 year old man whom we are seeing in consultation for evaluation of abnormal CT.  H&P reviewed.  Presented to his PCP yesterday with worsening lower extremity swelling over this last several days.  Reports PCP was in the lungs and had abnormal sounds on the right.  Blood pressure was borderline.  Recommend presentation to ED for concern of heart failure exacerbation.  Chest x-ray revealed right upper lung field pneumonia on my review and interpretation.  Subsequent CT reveals right upper lobe pneumonia with cavitation.  This prompted consultation for pulmonary.  He has cough but no worse than baseline.  No worsening dyspnea.  No fever or chills.  He denies significant reflux symptoms.  Does not wake up coughing or choking.  He does cough when eating or drinking hard to say how frequently.  When pushed he says less than once a week no more than once a week.  Certainly not with most meals.  Not a daily occurrence.  Pertinent  Medical History  CAD, CABG, chronic systolic heart failure, [place] cirrhosis, hypertension, CKD, OSA  Significant Hospital Events: Including procedures, antibiotic start and stop dates in addition to other pertinent events   9/6 presented to PCP, had low blood pressures and abnormal lung exam with significant lower extremity edema so sent to ED for concern for volume overload, chest imaging reveals right upper lobe cavitary pneumonia/mass  Interim History / Subjective:    Objective   Blood pressure (!) 84/69, pulse 66, temperature 98.3 F (36.8 C), temperature source Oral, resp. rate 12, weight 118.4 kg, SpO2 93 %.        Intake/Output Summary (Last 24 hours) at 09/19/2021 0857 Last data filed at 09/19/2021 0000 Gross per 24 hour  Intake 120 ml  Output --   Net 120 ml   Filed Weights   09/19/21 0123  Weight: 118.4 kg    Examination: General: Elderly, lying in bed Eyes: EOMI, no icterus Neck: No JVP, normal range of motion Lungs: Inspiratory and expiratory crackles right upper lung fields, otherwise clear Cardiovascular: Regular rate and rhythm, warm, significant lower extremity edema Abdomen: Nondistended, bowel sounds present Extremities: Ruddy, bumpy, elephantiasis bilaterally with significant pitting edema above this area on the shin, no abrasions or other lesions Neuro: Strength intact, sensation intact Psych: Normal mood, full affect  Resolved Hospital Problem list     Assessment & Plan:  Right upper lobe cavitary lung lesion: Curiously, he denies any real infectious symptoms.  He does have cough but does not seem any worse.  No fever or chills.  No worsening dyspnea.  Denies significant reflux or aspiration symptoms although he does occasionally cough with eating or drinking.  Suspect bacterial pneumonia.  Given his other lack of symptoms malignancy, organized pneumonia etc. are also on the differential. -- Recommend 4 weeks of Augmentin 875-125 twice daily -- We will arrange outpatient pulmonary follow-up after repeat CT scan in 4 weeks to determine next steps if any  Plan of care discussed with attending hospitalist.  Pulmonary will sign off, we are available as needed.  Best Practice (right click and "Reselect all SmartList Selections" daily)   Per primary  Labs   CBC: Recent Labs  Lab 09/18/21 1626 09/19/21 0429  WBC 16.1* 13.1*  NEUTROABS 12.9*  --  HGB 11.5* 10.6*  HCT 34.7* 32.6*  MCV 99.4 100.0  PLT 125* 108*    Basic Metabolic Panel: Recent Labs  Lab 09/13/21 1432 09/18/21 1626 09/19/21 0429  NA 133* 131* 131*  K 4.2 4.4 3.5  CL 93* 97* 98  CO2 '25 26 25  '$ GLUCOSE 102* 108* 98  BUN 23 28* 29*  CREATININE 1.21 1.35* 1.24  CALCIUM 8.8 8.4* 8.2*   GFR: Estimated Creatinine Clearance: 63 mL/min  (by C-G formula based on SCr of 1.24 mg/dL). Recent Labs  Lab 09/18/21 1626 09/19/21 0429  WBC 16.1* 13.1*    Liver Function Tests: Recent Labs  Lab 09/19/21 0429  AST 38  ALT 21  ALKPHOS 101  BILITOT 3.2*  PROT 5.9*  ALBUMIN 1.9*   No results for input(s): "LIPASE", "AMYLASE" in the last 168 hours. No results for input(s): "AMMONIA" in the last 168 hours.  ABG No results found for: "PHART", "PCO2ART", "PO2ART", "HCO3", "TCO2", "ACIDBASEDEF", "O2SAT"   Coagulation Profile: No results for input(s): "INR", "PROTIME" in the last 168 hours.  Cardiac Enzymes: No results for input(s): "CKTOTAL", "CKMB", "CKMBINDEX", "TROPONINI" in the last 168 hours.  HbA1C: No results found for: "HGBA1C"  CBG: No results for input(s): "GLUCAP" in the last 168 hours.  Review of Systems:   No chest pain with exertion.  No orthopnea or PND.  Comprehensive review of systems otherwise negative.  Past Medical History:  He,  has a past medical history of Cancer ([place]), Chronic kidney disease, Coronary artery disease, Glaucoma, Heart disease, History of kidney stones, Hyperlipidemia, Hypertension, Myocardial infarction ([place]) (17 yrs ago), Neuromuscular disorder ([place]), Neuropathy, Pneumonia, Renal insufficiency, and Sleep apnea with use of continuous positive airway pressure (CPAP).   Surgical History:   Past Surgical History:  Procedure Laterality Date   APPENDECTOMY  2014   CHOLECYSTECTOMY N/A 06/21/2019   Procedure: LAPAROSCOPIC CHOLECYSTECTOMY WITH INTRAOPERATIVE CHOLANGIOGRAM;  Surgeon: Armandina Gemma, MD;  Location: [facility];  Service: General;  Laterality: N/A;   COLON RESECTION N/A 08/27/2012   Procedure: Diagnostic laparoscopy; Ileocecectomy;  Surgeon: Madilyn Hook, DO;  Location: [facility];  Service: General;  Laterality: N/A;   COLONOSCOPY WITH PROPOFOL N/A 04/09/2015   Procedure: COLONOSCOPY WITH PROPOFOL;  Surgeon: Garlan Fair, MD;  Location: [facility];  Service: Endoscopy;   Laterality: N/A;   CORONARY ARTERY BYPASS GRAFT  1998   x5   CYSTOSCOPY/URETEROSCOPY/HOLMIUM LASER/STENT PLACEMENT Right 02/25/2018   Procedure: RIGHT URETEROSCOPY/HOLMIUM LASER/ STONE REMOVAL  STENT PLACEMENT;  Surgeon: Ardis Hughs, MD;  Location: [facility];  Service: Urology;  Laterality: Right;   ERCP N/A 06/22/2019   Procedure: ENDOSCOPIC RETROGRADE CHOLANGIOPANCREATOGRAPHY (ERCP);  Surgeon: Clarene Essex, MD;  Location: [facility];  Service: Endoscopy;  Laterality: N/A;   EYE SURGERY     cataract and glaucome   LUMBAR LAMINECTOMY/DECOMPRESSION MICRODISCECTOMY N/A 10/19/2015   Procedure: Lumbar three to four LAMINECTOMY/FORAMINOTOMY;  Surgeon: Erline Levine, MD;  Location: [facility];  Service: Neurosurgery;  Laterality: N/A;   LUNG SURGERY  yrs ago   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 His smoking use included cigarettes. He has  never used smokeless tobacco. He reports that he does not drink alcohol and does not use drugs.   Family History:  His family history includes Heart attack in his brother; Hypertension in his father and sister.   Allergies Allergies  Allergen Reactions   Other Rash and Other (See Comments)    CHG soap and wipes patient states today(06/21/19), he does not recall this allergy. He completed the pre-op chg baths without any problems.     Home Medications  Prior to Admission medications   Medication Sig Start Date End Date Taking? Authorizing Provider  Ascorbic Acid (VITAMIN C) 1000 MG tablet Take 1,000 mg by mouth every evening.     [provider]  aspirin EC 81 MG tablet Take 1 tablet (81 mg total) by  mouth daily. Swallow whole. 12/15/19   Almyra Deforest, PA  atenolol (TENORMIN) 50 MG tablet Take 1.5 tablets (75 mg total) by mouth daily. 08/07/20   Troy Sine, MD  BEE POLLEN PO Take 3 capsules by mouth every evening.     [provider]  pantoprazole (PROTONIX) 40 MG tablet Take 1 tablet by mouth daily.    [provider]  spironolactone (ALDACTONE) 50 MG tablet Take 50 mg by mouth daily. 12/14/19   [provider]  torsemide (DEMADEX) 20 MG tablet TAKE 1 TABLET BY MOUTH IN THE  MORNING AND 1 TABLET BY MOUTH IN THE AFTERNOON AROUND 3 PM OR 4  PM Patient taking differently: TAKE 40 MG TWICE DAILY FOR 3 DAYS. THEN TAKE 40 MG IN THE MORNING AND 20 MG AT NOON 07/25/21   Troy Sine, MD  torsemide (DEMADEX) 20 MG tablet TAKE 40 MG TWICE DAILY FOR 3 DAYS. THEN TAKE 40 MG IN THE MORNING AND 20 MG AT NOON. 09/05/21   Lenna Sciara, NP  vitamin B-12 (CYANOCOBALAMIN) 100 MCG tablet Take 100 mcg by mouth every evening.     [provider]     Critical care time: n/a   Lanier Clam, MD See Shea Evans for contact info

## 2021-09-19 NOTE — Assessment & Plan Note (Addendum)
Stable and asymptomatic from a cardiac standpoint. Continue daily aspirin.

## 2021-09-19 NOTE — Progress Notes (Signed)
Pt refused cpap tonight.  Pt stated he doesn't wear it every night at home and does not want to wear it tonight.  Pt was advised that RT is available all night should he change his mind.

## 2021-09-19 NOTE — Assessment & Plan Note (Signed)
Borderline BP with SBP in the 90s at times.  -Has received IV Lasix '40mg'$  in ED. Monitoring BP and renal function tomorrow before resuming diuresis

## 2021-09-19 NOTE — Progress Notes (Signed)
PROGRESS NOTE    Joseph Bryan  WCH:[id number] DOB: 11/20/1940 DOA: 09/18/2021 PCP: Leonard Downing, MD   Brief Narrative:  81 y.o. male with medical history significant of CAD s/p PCI, CABG x 3 in 1998, RBBB, ICM, chronic systolic heart failure,nonalcoholic cirrhosis, hypertension, hyperlipidemia, CKD with atrophic Left kidney, and OSA on CPAP presented with hypotension and increased lower extremity swelling; sent by cardiology office for hypotension.  On presentation, he was hypotensive at times in the 90s over 40s; found to have WBC of 16.1; creatinine of 1.35.  BNP of 388.  CTA chest was negative for pulmonary embolism but showed right upper lobe cavitary pneumonia.  He was started on IV vancomycin and cefepime.  1 dose of IV Lasix given.  Assessment & Plan:   Hypotension History of hypertension -Blood pressure still on the lower side, systolic in the 35T or 61W.  We will start midodrine.  Antihypertensives on hold for now.  Will defer diuretic management to cardiology  Acute on chronic systolic heart failure -Presented with worsening bilateral lower extremity edema -Received 1 dose of IV Lasix on presentation.  Blood pressure low, diuretics on hold for now.  I have consulted cardiology.  Follow recommendations.  Follow 2D echo.  Follow lower extremity duplex.  Cavitary pneumonia -Patient has been evaluated by pulmonary at my request.  IV antibiotics has been changed to oral Augmentin: Will need Augmentin for 4 weeks and outpatient follow-up with pulmonary.  Chronic kidney disease stage IIIa -Acute kidney injury has been ruled out.  Creatinine at baseline.  Monitor  Hyponatremia -Mild.  Monitor  Hypoalbuminemia -Nutrition consult  Leukocytosis -Improving.  Monitor  Thrombocytopenia -No signs of bleeding.  Monitor  History of CAD status post CABG -No chest pain.  Currently stable.  Follow cardiology recommendations  Obesity -Outpatient follow-up   DVT prophylaxis:  Lovenox Code Status: Partial Family Communication: Wife at bedside Disposition Plan: Status is: Inpatient Remains inpatient appropriate because: Of severity of illness  Consultants: Pulmonary/cardiology  Procedures: None  Antimicrobials:  Anti-infectives (From admission, onward)    Start     Dose/Rate Route Frequency Ordered Stop   09/20/21 1000  vancomycin (VANCOREADY) IVPB 2000 mg/400 mL  Status:  Discontinued        2,000 mg 200 mL/hr over 120 Minutes Intravenous Every 36 hours 09/19/21 0025 09/19/21 0914   09/19/21 1000  ceFEPIme (MAXIPIME) 2 g in sodium chloride 0.9 % 100 mL IVPB  Status:  Discontinued        2 g 200 mL/hr over 30 Minutes Intravenous Every 12 hours 09/19/21 0025 09/19/21 0914   09/19/21 1000  amoxicillin-clavulanate (AUGMENTIN) 875-125 MG per tablet 1 tablet        1 tablet Oral 2 times daily 09/19/21 0914     09/18/21 2200  cefTRIAXone (ROCEPHIN) 1 g in sodium chloride 0.9 % 100 mL IVPB  Status:  Discontinued        1 g 200 mL/hr over 30 Minutes Intravenous  Once 09/18/21 2151 09/18/21 2155   09/18/21 2200  azithromycin (ZITHROMAX) 500 mg in sodium chloride 0.9 % 250 mL IVPB  Status:  Discontinued        500 mg 250 mL/hr over 60 Minutes Intravenous  Once 09/18/21 2151 09/18/21 2155   09/18/21 2200  vancomycin (VANCOREADY) IVPB 2000 mg/400 mL        2,000 mg 200 mL/hr over 120 Minutes Intravenous  Once 09/18/21 2158 09/19/21 0100   09/18/21 2200  ceFEPIme (MAXIPIME) 2 g  in sodium chloride 0.9 % 100 mL IVPB        2 g 200 mL/hr over 30 Minutes Intravenous  Once 09/18/21 2158 09/18/21 2255        Subjective: Patient seen and examined at bedside.  Denies worsening shortness of breath or cough.  Still complains of lower extremity swelling.  No chest pain, fever or vomiting reported.  Objective: Vitals:   09/19/21 0000 09/19/21 0123 09/19/21 0335 09/19/21 0800  BP:   107/60 (!) 84/69  Pulse:   68 66  Resp: '16  15 12  '$ Temp:   98.3 F (36.8 C)    TempSrc:   Oral Oral  SpO2:   93%   Weight:  118.4 kg      Intake/Output Summary (Last 24 hours) at 09/19/2021 1043 Last data filed at 09/19/2021 1024 Gross per 24 hour  Intake 355 ml  Output 700 ml  Net -345 ml   Filed Weights   09/19/21 0123  Weight: 118.4 kg    Examination:  General exam: Appears calm and comfortable.  Currently on room air.  Looks chronically ill and deconditioned.  Elderly male lying in bed. Respiratory system: Bilateral decreased breath sounds at bases with some scattered crackles Cardiovascular system: S1 & S2 heard, Rate controlled Gastrointestinal system: Abdomen is nondistended, soft and nontender. Normal bowel sounds heard. Extremities: No cyanosis, clubbing; bilateral lower extremity edema present Central nervous system: Awake, slow to respond.  Poor historian.  No focal neurological deficits. Moving extremities Skin: No petechiae or ulcers.  Bilateral lower extremity chronic skin changes.   Psychiatry: Extremely flat affect.  No signs of agitation.    Data Reviewed: I have personally reviewed following labs and imaging studies  CBC: Recent Labs  Lab 09/18/21 1626 09/19/21 0429  WBC 16.1* 13.1*  NEUTROABS 12.9*  --   HGB 11.5* 10.6*  HCT 34.7* 32.6*  MCV 99.4 100.0  PLT 125* 443*   Basic Metabolic Panel: Recent Labs  Lab 09/13/21 1432 09/18/21 1626 09/19/21 0429  NA 133* 131* 131*  K 4.2 4.4 3.5  CL 93* 97* 98  CO2 '25 26 25  '$ GLUCOSE 102* 108* 98  BUN 23 28* 29*  CREATININE 1.21 1.35* 1.24  CALCIUM 8.8 8.4* 8.2*   GFR: Estimated Creatinine Clearance: 63 mL/min (by C-G formula based on SCr of 1.24 mg/dL). Liver Function Tests: Recent Labs  Lab 09/19/21 0429  AST 38  ALT 21  ALKPHOS 101  BILITOT 3.2*  PROT 5.9*  ALBUMIN 1.9*   No results for input(s): "LIPASE", "AMYLASE" in the last 168 hours. No results for input(s): "AMMONIA" in the last 168 hours. Coagulation Profile: No results for input(s): "INR", "PROTIME" in the  last 168 hours. Cardiac Enzymes: No results for input(s): "CKTOTAL", "CKMB", "CKMBINDEX", "TROPONINI" in the last 168 hours. BNP (last 3 results) No results for input(s): "PROBNP" in the last 8760 hours. HbA1C: No results for input(s): "HGBA1C" in the last 72 hours. CBG: No results for input(s): "GLUCAP" in the last 168 hours. Lipid Profile: No results for input(s): "CHOL", "HDL", "LDLCALC", "TRIG", "CHOLHDL", "LDLDIRECT" in the last 72 hours. Thyroid Function Tests: No results for input(s): "TSH", "T4TOTAL", "FREET4", "T3FREE", "THYROIDAB" in the last 72 hours. Anemia Panel: No results for input(s): "VITAMINB12", "FOLATE", "FERRITIN", "TIBC", "IRON", "RETICCTPCT" in the last 72 hours. Sepsis Labs: No results for input(s): "PROCALCITON", "LATICACIDVEN" in the last 168 hours.  No results found for this or any previous visit (from the past 240 hour(s)).  Radiology Studies: CT Angio Chest PE W and/or Wo Contrast  Result Date: 09/18/2021 CLINICAL DATA:  Concern for pulmonary embolism. EXAM: CT ANGIOGRAPHY CHEST WITH CONTRAST TECHNIQUE: Multidetector CT imaging of the chest was performed using the standard protocol during bolus administration of intravenous contrast. Multiplanar CT image reconstructions and MIPs were obtained to evaluate the vascular anatomy. RADIATION DOSE REDUCTION: This exam was performed according to the departmental dose-optimization program which includes automated exposure control, adjustment of the mA and/or kV according to patient size and/or use of iterative reconstruction technique. CONTRAST:  126m OMNIPAQUE IOHEXOL 350 MG/ML SOLN COMPARISON:  Chest radiograph dated 09/18/2021. FINDINGS: Evaluation of this exam is limited due to respiratory motion artifact. Cardiovascular: There is no cardiomegaly or pericardial effusion. There is 3 vessel coronary vascular calcification and postsurgical changes of CABG. There is moderate atherosclerotic calcification of the  thoracic aorta. No aneurysmal dilatation. No pulmonary artery embolus identified. Mediastinum/Nodes: Mildly enlarged right hilar lymph node measures 11 mm short axis. No mediastinal adenopathy. The esophagus is grossly unremarkable. No mediastinal fluid collection. Lungs/Pleura: Background of predominantly paraseptal emphysema with right upper lobe subpleural scarring. Patchy area of ground-glass density in the right upper lobe consistent with pneumonia. There is a 2 cm central cavitary component noted concerning for a cavitary infection clinical correlation is recommended. Several additional small scattered ground-glass nodules predominantly in the superior segment of the lower lobes bilaterally suspicious for additional foci of infection. There is no pleural effusion or pneumothorax. The central airways are patent. Small mucous secretion noted in the right mainstem bronchus. Upper Abdomen: Small ascites.  Cholecystectomy.  Cirrhosis. Musculoskeletal: Osteopenia with degenerative changes of the spine. Median sternotomy wires. No acute osseous pathology. Review of the MIP images confirms the above findings. IMPRESSION: 1. No CT evidence of pulmonary artery embolus. 2. Right upper lobe cavitary pneumonia. 3. Mildly enlarged right hilar lymph node, likely reactive. 4. Cirrhosis with small ascites. 5. Aortic Atherosclerosis (ICD10-I70.0) and Emphysema (ICD10-J43.9). Electronically Signed   By: AAnner CreteM.D.   On: 09/18/2021 22:01   DG Chest 2 View  Result Date: 09/18/2021 CLINICAL DATA:  Cough.  Bilateral leg swelling. EXAM: CHEST - 2 VIEW COMPARISON:  Chest radiographs 02/04/2018 FINDINGS: The cardiac silhouette is partially obscured but appears borderline to mildly enlarged. Prior CABG and aortic atherosclerosis are noted. Lung volumes are diminished with increased interstitial densities bilaterally as well as more confluent, hazy opacity in the right upper lobe. Biapical pleural thickening is noted. No  sizable pleural effusion or pneumothorax is identified. No acute osseous abnormality is seen. IMPRESSION: Low lung volumes with bilateral interstitial and hazy right upper lobe opacities which may reflect pneumonia or edema. Electronically Signed   By: ALogan BoresM.D.   On: 09/18/2021 16:55        Scheduled Meds:  amoxicillin-clavulanate  1 tablet Oral BID   enoxaparin (LOVENOX) injection  40 mg Subcutaneous Q24H   Continuous Infusions:        KAline August MD [facility] 09/19/2021, 10:43 AM

## 2021-09-19 NOTE — Assessment & Plan Note (Signed)
Bilateral LE edema/chronic systolic heart failure -Had persistent LE edema for 2 weeks with uptitration of Torsemide without significant improvement. However now with borderline BP and AKI likely from overdiuresis.  -Has already received IV '40mg'$  Lasix in ED. Hold diuretic for now pending monitoring of BP overnight.  Can resume tomorrow if blood pressure improves. -Last echocardiogram on 12/2019 with EF of 45 to 50% with global hypokinesis.  Mild mitral valve regurgitation.  Mild aortic valve regurgitation. Does not have any symptoms of dyspnea or orthopnea. Hold on repeat echo.

## 2021-09-19 NOTE — Progress Notes (Signed)
Pharmacy Antibiotic Note  Joseph Bryan is a 81 y.o. male admitted on 09/18/2021 with medical history significant of CAD s/p PCI, CABG x 3 in 1998, RBBB, ICM, chronic systolic heart failure,nonalcoholic cirrhosis, hypertension, hyperlipidemia, CKD with atrophic Lf kidney, and OSA on CPAP who presents for hypotension and increase LE edema.  Pharmacy has been consulted to dose vancomycin and cefepime for PNA, 1st doses given in the ED  Plan: Vancomycin 2gm IV q36h (AUC 493.9, Scr 1.35) Cefepime 2gm IV q12h Follow renal function, cultures and clinical course     Temp (24hrs), Avg:98.6 F (37 C), Min:98.1 F (36.7 C), Max:99.1 F (37.3 C)  Recent Labs  Lab 09/13/21 1432 09/18/21 1626  WBC  --  16.1*  CREATININE 1.21 1.35*    Estimated Creatinine Clearance: 58.5 mL/min (A) (by C-G formula based on SCr of 1.35 mg/dL (H)).    Allergies  Allergen Reactions   Other Rash and Other (See Comments)    CHG soap and wipes patient states today(06/21/19), he does not recall this allergy. He completed the pre-op chg baths without any problems.    Antimicrobials this admission: 9/6 vanc >> 9/6 cefepime >>  Dose adjustments this admission:   Microbiology results:  9/6 Sputum:  Thank you for allowing pharmacy to be a part of this patient's care.  Dolly Rias RPh 09/19/2021, 12:23 AM

## 2021-09-20 DIAGNOSIS — D696 Thrombocytopenia, unspecified: Secondary | ICD-10-CM | POA: Diagnosis not present

## 2021-09-20 DIAGNOSIS — I5020 Unspecified systolic (congestive) heart failure: Secondary | ICD-10-CM | POA: Diagnosis not present

## 2021-09-20 DIAGNOSIS — I959 Hypotension, unspecified: Secondary | ICD-10-CM | POA: Diagnosis not present

## 2021-09-20 DIAGNOSIS — J189 Pneumonia, unspecified organism: Secondary | ICD-10-CM | POA: Diagnosis not present

## 2021-09-20 LAB — CBC WITH DIFFERENTIAL/PLATELET
Abs Immature Granulocytes: 0.05 10*3/uL (ref 0.00–0.07)
Basophils Absolute: 0.1 10*3/uL (ref 0.0–0.1)
Basophils Relative: 1 %
Eosinophils Absolute: 0.3 10*3/uL (ref 0.0–0.5)
Eosinophils Relative: 3 %
HCT: 31.2 % — ABNORMAL LOW (ref 39.0–52.0)
Hemoglobin: 10.6 g/dL — ABNORMAL LOW (ref 13.0–17.0)
Immature Granulocytes: 0 %
Lymphocytes Relative: 10 %
Lymphs Abs: 1.2 10*3/uL (ref 0.7–4.0)
MCH: 33.1 pg (ref 26.0–34.0)
MCHC: 34 g/dL (ref 30.0–36.0)
MCV: 97.5 fL (ref 80.0–100.0)
Monocytes Absolute: 1.3 10*3/uL — ABNORMAL HIGH (ref 0.1–1.0)
Monocytes Relative: 11 %
Neutro Abs: 8.6 10*3/uL — ABNORMAL HIGH (ref 1.7–7.7)
Neutrophils Relative %: 75 %
Platelets: 134 10*3/uL — ABNORMAL LOW (ref 150–400)
RBC: 3.2 MIL/uL — ABNORMAL LOW (ref 4.22–5.81)
RDW: 17.2 % — ABNORMAL HIGH (ref 11.5–15.5)
WBC: 11.4 10*3/uL — ABNORMAL HIGH (ref 4.0–10.5)
nRBC: 0 % (ref 0.0–0.2)

## 2021-09-20 LAB — BASIC METABOLIC PANEL
Anion gap: 7 (ref 5–15)
BUN: 29 mg/dL — ABNORMAL HIGH (ref 8–23)
CO2: 27 mmol/L (ref 22–32)
Calcium: 7.8 mg/dL — ABNORMAL LOW (ref 8.9–10.3)
Chloride: 97 mmol/L — ABNORMAL LOW (ref 98–111)
Creatinine, Ser: 1.09 mg/dL (ref 0.61–1.24)
GFR, Estimated: >60 mL/min (ref >60)
Glucose, Bld: 119 mg/dL — ABNORMAL HIGH (ref 70–99)
Potassium: 3.7 mmol/L (ref 3.5–5.1)
Sodium: 131 mmol/L — ABNORMAL LOW (ref 135–145)

## 2021-09-20 LAB — MAGNESIUM: Magnesium: 1.9 mg/dL (ref 1.7–2.4)

## 2021-09-20 MED ORDER — FUROSEMIDE 10 MG/ML IJ SOLN
40.0000 mg | Freq: Once | INTRAMUSCULAR | Status: AC
  Administered 2021-09-20: 40 mg via INTRAVENOUS
  Filled 2021-09-20: qty 4

## 2021-09-20 NOTE — Evaluation (Signed)
Physical Therapy Evaluation Patient Details Name: Joseph Bryan MRN: [id number] DOB: 29-Aug-1940 Today's Date: 09/20/2021  History of Present Illness  81 y.o. male with PMH: CAD s/p PCI, CABG x 3 in 1998, RBBB, ICM, chronic systolic heart failure,nonalcoholic cirrhosis, hypertension, hyperlipidemia, CKD with atrophic Left kidney, and OSA on CPAP presented with hypotension and increased lower extremity swelling; admitted with CHF  Clinical Impression  Pt admitted with above diagnosis.  Pt amb in room, mildly fatigued, just amb to bathroom with nursing staff. VSS with HR 70s to 80s, RR 18-20. Encouraged OOB in chair as tolerated, pt performed ankle pumps x10.  Pt currently with functional limitations due to the deficits listed below (see PT Problem List). Pt will benefit from skilled PT to increase their independence and safety with mobility to allow discharge to the venue listed below.           Recommendations for follow up therapy are one component of a multi-disciplinary discharge planning process, led by the attending physician.  Recommendations may be updated based on patient status, additional functional criteria and insurance authorization.  Follow Up Recommendations No PT follow up      Assistance Recommended at Discharge Intermittent Supervision/Assistance  Patient can return home with the following  Assist for transportation;Assistance with cooking/housework    Equipment Recommendations None recommended by PT  Recommendations for Other Services       Functional Status Assessment Patient has had a recent decline in their functional status and demonstrates the ability to make significant improvements in function in a reasonable and predictable amount of time.     Precautions / Restrictions Precautions Precautions: Fall Restrictions Weight Bearing Restrictions: No RLE Weight Bearing: Weight bearing as tolerated LLE Weight Bearing: Weight bearing as tolerated       Mobility  Bed Mobility Overal bed mobility: Modified Independent                  Transfers Overall transfer level: Needs assistance Equipment used: Rolling walker (2 wheels) Transfers: Sit to/from Stand Sit to Stand: Min guard, From elevated surface           General transfer comment: cues for hand placement and to power up with LEs    Ambulation/Gait Ambulation/Gait assistance: Min guard, Min assist Gait Distance (Feet): 25 Feet   Gait Pattern/deviations: Step-through pattern, Decreased stride length       General Gait Details: cues to keep feet inside RW especially during turns  Financial trader Rankin (Stroke Patients Only)       Balance Overall balance assessment: Needs assistance Sitting-balance support: Feet supported, No upper extremity supported Sitting balance-Leahy Scale: Good     Standing balance support: Reliant on assistive device for balance, During functional activity Standing balance-Leahy Scale: Poor                               Pertinent Vitals/Pain Pain Assessment Pain Assessment: No/denies pain    Home Living Family/patient expects to be discharged to:: Private residence Living Arrangements: Spouse/significant other Available Help at Discharge: Family;Available 24 hours/day Type of Home: House Home Access: Stairs to enter Entrance Stairs-Rails: None Entrance Stairs-Number of Steps: 1+1   Home Layout: One level Home Equipment: Conservation officer, nature (2 wheels);Cane - single point;Grab bars - tub/shower;Grab bars - toilet;Shower seat      Prior Function Prior Level of  Function : Independent/Modified Independent             Mobility Comments: amb with cane, has RW       Hand Dominance        Extremity/Trunk Assessment   Upper Extremity Assessment Upper Extremity Assessment: Overall WFL for tasks assessed;Defer to OT evaluation    Lower Extremity  Assessment Lower Extremity Assessment: Overall WFL for tasks assessed       Communication   Communication: No difficulties  Cognition Arousal/Alertness: Awake/alert Behavior During Therapy: WFL for tasks assessed/performed Overall Cognitive Status: Within Functional Limits for tasks assessed                                          General Comments      Exercises     Assessment/Plan    PT Assessment Patient needs continued PT services  PT Problem List Decreased strength;Decreased range of motion;Decreased activity tolerance;Decreased balance;Decreased mobility;Decreased knowledge of use of DME       PT Treatment Interventions DME instruction;Therapeutic exercise;Gait training;Functional mobility training;Therapeutic activities;Patient/family education    PT Goals (Current goals can be found in the Care Plan section)  Acute Rehab PT Goals Patient Stated Goal: home soon PT Goal Formulation: With patient/family Time For Goal Achievement: 10/04/21 Potential to Achieve Goals: Good    Frequency Min 3X/week     Co-evaluation               AM-PAC PT "6 Clicks" Mobility  Outcome Measure Help needed turning from your back to your side while in a flat bed without using bedrails?: None Help needed moving from lying on your back to sitting on the side of a flat bed without using bedrails?: None Help needed moving to and from a bed to a chair (including a wheelchair)?: A Little Help needed standing up from a chair using your arms (e.g., wheelchair or bedside chair)?: A Little Help needed to walk in hospital room?: A Little Help needed climbing 3-5 steps with a railing? : A Little 6 Click Score: 20    End of Session Equipment Utilized During Treatment: Gait belt Activity Tolerance: Patient tolerated treatment well Patient left: with call bell/phone within reach;in chair;with chair alarm set;with family/visitor present Nurse Communication: Mobility  status PT Visit Diagnosis: Other abnormalities of gait and mobility (R26.89);Difficulty in walking, not elsewhere classified (R26.2)    Time: 4097-3532 PT Time Calculation (min) (ACUTE ONLY): 19 min   Charges:   PT Evaluation $PT Eval Low Complexity: [place], PT  Acute Rehab Dept [facility]) [phone]  [facility] Weekend Pager [facility] only)  [phone]  09/20/2021   [facility] 09/20/2021, 2:36 PM

## 2021-09-20 NOTE — Telephone Encounter (Signed)
Pt scheduled on 10/10. CT is on 10/5.

## 2021-09-20 NOTE — Progress Notes (Addendum)
PROGRESS NOTE    Joseph Bryan  NOM:[id number] DOB: 06-03-1940 DOA: 09/18/2021 PCP: Leonard Downing, MD   Brief Narrative:  81 y.o. male with medical history significant of CAD s/p PCI, CABG x 3 in 1998, RBBB, ICM, chronic systolic heart failure,nonalcoholic cirrhosis, hypertension, hyperlipidemia, CKD with atrophic Left kidney, and OSA on CPAP presented with hypotension and increased lower extremity swelling; sent by cardiology office for hypotension.  On presentation, he was hypotensive at times in the 90s over 40s; found to have WBC of 16.1; creatinine of 1.35.  BNP of 388.  CTA chest was negative for pulmonary embolism but showed right upper lobe cavitary pneumonia.  He was started on IV vancomycin and cefepime.  1 dose of IV Lasix given.  Pulmonary and cardiology were consulted.  Assessment & Plan:   Hypotension History of hypertension -Blood pressure still on the lower side but slightly improving.  Continue midodrine.  Antihypertensives on hold for now.  Will defer diuretic management to cardiology  Acute on chronic systolic heart failure -Presented with worsening bilateral lower extremity edema -Received 1 dose of IV Lasix on presentation.  Blood pressure low, diuretics on hold for now.  Cardiology following.  Follow recommendations.  2D echo showed EF of 45 to 50% with grade 1 diastolic dysfunction.   -Strict input and output.  Daily weights.  Fluid restriction.    Cavitary pneumonia -Continue Augmentin for 4 weeks and outpatient follow-up with pulmonary.  Chronic kidney disease stage IIIa -Acute kidney injury has been ruled out.  Creatinine at baseline.  Monitor  Hyponatremia -Mild.  Monitor  Hypoalbuminemia -Nutrition consult  Leukocytosis -Improving.  Monitor  Thrombocytopenia -No signs of bleeding.  Monitor  History of CAD status post CABG -No chest pain.  Currently stable.  Follow cardiology recommendations  Obesity -Outpatient follow-up   DVT  prophylaxis: Lovenox Code Status: Partial Family Communication: Wife at bedside Disposition Plan: Status is: Inpatient Remains inpatient appropriate because: Of severity of illness  Consultants: Pulmonary/cardiology  Procedures: 2D echo Antimicrobials:  Anti-infectives (From admission, onward)    Start     Dose/Rate Route Frequency Ordered Stop   09/20/21 1000  vancomycin (VANCOREADY) IVPB 2000 mg/400 mL  Status:  Discontinued        2,000 mg 200 mL/hr over 120 Minutes Intravenous Every 36 hours 09/19/21 0025 09/19/21 0914   09/19/21 1000  ceFEPIme (MAXIPIME) 2 g in sodium chloride 0.9 % 100 mL IVPB  Status:  Discontinued        2 g 200 mL/hr over 30 Minutes Intravenous Every 12 hours 09/19/21 0025 09/19/21 0914   09/19/21 1000  amoxicillin-clavulanate (AUGMENTIN) 875-125 MG per tablet 1 tablet        1 tablet Oral 2 times daily 09/19/21 0914     09/18/21 2200  cefTRIAXone (ROCEPHIN) 1 g in sodium chloride 0.9 % 100 mL IVPB  Status:  Discontinued        1 g 200 mL/hr over 30 Minutes Intravenous  Once 09/18/21 2151 09/18/21 2155   09/18/21 2200  azithromycin (ZITHROMAX) 500 mg in sodium chloride 0.9 % 250 mL IVPB  Status:  Discontinued        500 mg 250 mL/hr over 60 Minutes Intravenous  Once 09/18/21 2151 09/18/21 2155   09/18/21 2200  vancomycin (VANCOREADY) IVPB 2000 mg/400 mL        2,000 mg 200 mL/hr over 120 Minutes Intravenous  Once 09/18/21 2158 09/19/21 0100   09/18/21 2200  ceFEPIme (MAXIPIME) 2 g in  sodium chloride 0.9 % 100 mL IVPB        2 g 200 mL/hr over 30 Minutes Intravenous  Once 09/18/21 2158 09/18/21 2255        Subjective: Patient seen and examined at bedside.  Denies worsening chest pain, fever or vomiting.  Still weak and short of breath with exertion.  Still complains of lower extremity swelling. Objective: Vitals:   09/19/21 2000 09/19/21 2016 09/20/21 0500 09/20/21 0533  BP:  125/69  (!) 107/58  Pulse:  67  (!) 102  Resp: '14 18  18  '$ Temp:  99.5  F (37.5 C)  98.8 F (37.1 C)  TempSrc:  Oral  Oral  SpO2:  98%  95%  Weight:   119.3 kg     Intake/Output Summary (Last 24 hours) at 09/20/2021 0746 Last data filed at 09/20/2021 0600 Gross per 24 hour  Intake 685 ml  Output 1225 ml  Net -540 ml    Filed Weights   09/19/21 0123 09/20/21 0500  Weight: 118.4 kg 119.3 kg    Examination:  General: On room air.  No distress.  Looks chronically ill and deconditioned ENT/neck: No thyromegaly.  JVD is not elevated  respiratory: Decreased breath sounds at bases bilaterally with some crackles; no wheezing  CVS: S1-S2 heard, rate controlled currently Abdominal: Soft, nontender, slightly distended; no organomegaly, normal bowel sounds are heard Extremities: Bilateral lower extremity edema present; no cyanosis  CNS: Alert; still slow to respond.  Poor historian.  No focal neurologic deficit.  Moves extremities Lymph: No obvious lymphadenopathy Skin: No obvious ecchymosis/lesions.  Bilateral lower extremity chronic skin changes are present psych: Not agitated.  Flat affect.   Musculoskeletal: No obvious joint swelling/deformity     Data Reviewed: I have personally reviewed following labs and imaging studies  CBC: Recent Labs  Lab 09/18/21 1626 09/19/21 0429 09/20/21 0343  WBC 16.1* 13.1* 11.4*  NEUTROABS 12.9*  --  8.6*  HGB 11.5* 10.6* 10.6*  HCT 34.7* 32.6* 31.2*  MCV 99.4 100.0 97.5  PLT 125* 108* 134*    Basic Metabolic Panel: Recent Labs  Lab 09/13/21 1432 09/18/21 1626 09/19/21 0429 09/20/21 0343  NA 133* 131* 131* 131*  K 4.2 4.4 3.5 3.7  CL 93* 97* 98 97*  CO2 '25 26 25 27  '$ GLUCOSE 102* 108* 98 119*  BUN 23 28* 29* 29*  CREATININE 1.21 1.35* 1.24 1.09  CALCIUM 8.8 8.4* 8.2* 7.8*  MG  --   --   --  1.9    GFR: Estimated Creatinine Clearance: 71.9 mL/min (by C-G formula based on SCr of 1.09 mg/dL). Liver Function Tests: Recent Labs  Lab 09/19/21 0429  AST 38  ALT 21  ALKPHOS 101  BILITOT 3.2*   PROT 5.9*  ALBUMIN 1.9*    No results for input(s): "LIPASE", "AMYLASE" in the last 168 hours. No results for input(s): "AMMONIA" in the last 168 hours. Coagulation Profile: No results for input(s): "INR", "PROTIME" in the last 168 hours. Cardiac Enzymes: No results for input(s): "CKTOTAL", "CKMB", "CKMBINDEX", "TROPONINI" in the last 168 hours. BNP (last 3 results) No results for input(s): "PROBNP" in the last 8760 hours. HbA1C: No results for input(s): "HGBA1C" in the last 72 hours. CBG: No results for input(s): "GLUCAP" in the last 168 hours. Lipid Profile: No results for input(s): "CHOL", "HDL", "LDLCALC", "TRIG", "CHOLHDL", "LDLDIRECT" in the last 72 hours. Thyroid Function Tests: No results for input(s): "TSH", "T4TOTAL", "FREET4", "T3FREE", "THYROIDAB" in the last 72 hours. Anemia  Panel: No results for input(s): "VITAMINB12", "FOLATE", "FERRITIN", "TIBC", "IRON", "RETICCTPCT" in the last 72 hours. Sepsis Labs: No results for input(s): "PROCALCITON", "LATICACIDVEN" in the last 168 hours.  No results found for this or any previous visit (from the past 240 hour(s)).       Radiology Studies: ECHOCARDIOGRAM COMPLETE  Result Date: 09/19/2021    ECHOCARDIOGRAM REPORT   Patient Name:   HENDRICK PAVICH Date of Exam: 09/19/2021 Medical Rec #:  [id number]      Height:       73.0 in Accession #:    [id number]     Weight:       261.0 lb Date of Birth:  1940/12/17      BSA:          2.409 m Patient Age:    46 years       BP:           100/45 mmHg Patient Gender: M              HR:           66 bpm. Exam Location:  Inpatient Procedure: 2D Echo, Cardiac Doppler, Color Doppler and Intracardiac            Opacification Agent Indications:    CHF  History:        Patient has prior history of Echocardiogram examinations, most                 recent 01/12/2020. CAD; Risk Factors:Hypertension, Dyslipidemia                 and Sleep Apnea.  Sonographer:    Jefferey Pica Referring Phys: [id number]  [place]  1. Left ventricular ejection fraction, by estimation, is 45 to 50%. The left ventricle has mildly decreased function. The left ventricle demonstrates global hypokinesis. The left ventricular internal cavity size was mildly dilated. Left ventricular diastolic parameters are consistent with Grade I diastolic dysfunction (impaired relaxation).  2. Right ventricular systolic function is normal. The right ventricular size is normal. There is mildly elevated pulmonary artery systolic pressure.  3. Left atrial size was mildly dilated.  4. Right atrial size was mildly dilated.  5. The mitral valve is normal in structure. Trivial mitral valve regurgitation. No evidence of mitral stenosis.  6. Tricuspid valve regurgitation is moderate.  7. The aortic valve is tricuspid. Aortic valve regurgitation is trivial. No aortic stenosis is present.  8. The inferior vena cava is normal in size with greater than 50% respiratory variability, suggesting right atrial pressure of 3 mmHg. FINDINGS  Left Ventricle: Left ventricular ejection fraction, by estimation, is 45 to 50%. The left ventricle has mildly decreased function. The left ventricle demonstrates global hypokinesis. The left ventricular internal cavity size was mildly dilated. There is  no left ventricular hypertrophy. Left ventricular diastolic parameters are consistent with Grade I diastolic dysfunction (impaired relaxation). Right Ventricle: The right ventricular size is normal. Right ventricular systolic function is normal. There is mildly elevated pulmonary artery systolic pressure. The tricuspid regurgitant velocity is 3.11 m/s, and with an assumed right atrial pressure of 3 mmHg, the estimated right ventricular systolic pressure is 32.9 mmHg. Left Atrium: Left atrial size was mildly dilated. Right Atrium: Right atrial size was mildly dilated. Pericardium: There is no evidence of pericardial effusion. Mitral Valve: The mitral valve is normal in  structure. Trivial mitral valve regurgitation. No evidence of mitral valve stenosis. Tricuspid Valve: The tricuspid valve is normal in  structure. Tricuspid valve regurgitation is moderate . No evidence of tricuspid stenosis. Aortic Valve: The aortic valve is tricuspid. Aortic valve regurgitation is trivial. Aortic regurgitation PHT measures 343 msec. No aortic stenosis is present. Aortic valve peak gradient measures 6.9 mmHg. Pulmonic Valve: The pulmonic valve was normal in structure. Pulmonic valve regurgitation is trivial. No evidence of pulmonic stenosis. Aorta: The aortic root is normal in size and structure. Venous: The inferior vena cava is normal in size with greater than 50% respiratory variability, suggesting right atrial pressure of 3 mmHg. IAS/Shunts: No atrial level shunt detected by color flow Doppler.  LEFT VENTRICLE PLAX 2D LVIDd:         5.50 cm   Diastology LVIDs:         4.50 cm   LV e' medial:    7.05 cm/s LV PW:         0.90 cm   LV E/e' medial:  11.5 LV IVS:        0.80 cm   LV e' lateral:   7.07 cm/s LVOT diam:     2.10 cm   LV E/e' lateral: 11.4 LV SV:         85 LV SV Index:   35 LVOT Area:     3.46 cm  RIGHT VENTRICLE             IVC RV Basal diam:  3.20 cm     IVC diam: 1.70 cm RV S prime:     11.10 cm/s TAPSE (M-mode): 2.6 cm LEFT ATRIUM             Index        RIGHT ATRIUM           Index LA diam:        4.90 cm 2.03 cm/m   RA Area:     22.70 cm LA Vol (A2C):   87.6 ml 36.36 ml/m  RA Volume:   78.60 ml  32.62 ml/m LA Vol (A4C):   71.5 ml 29.67 ml/m LA Biplane Vol: 81.4 ml 33.78 ml/m  AORTIC VALVE                 PULMONIC VALVE AV Area (Vmax): 3.25 cm     PV Vmax:       0.89 m/s AV Vmax:        131.00 cm/s  PV Peak grad:  3.2 mmHg AV Peak Grad:   6.9 mmHg LVOT Vmax:      123.00 cm/s LVOT Vmean:     75.150 cm/s LVOT VTI:       0.246 m AI PHT:         343 msec  AORTA Ao Root diam: 3.40 cm Ao Asc diam:  2.95 cm MITRAL VALVE                TRICUSPID VALVE MV Area (PHT): 4.39 cm      TR Peak grad:   38.7 mmHg MV Decel Time: 173 msec     TR Vmax:        311.00 cm/s MV E velocity: 80.80 cm/s MV A velocity: 103.00 cm/s  SHUNTS MV E/A ratio:  0.78         Systemic VTI:  0.25 m                             Systemic Diam: 2.10 cm Kirk Ruths MD Electronically signed by Kirk Ruths  MD Signature Date/Time: 09/19/2021/4:01:46 PM    Final    CT Angio Chest PE W and/or Wo Contrast  Result Date: 09/18/2021 CLINICAL DATA:  Concern for pulmonary embolism. EXAM: CT ANGIOGRAPHY CHEST WITH CONTRAST TECHNIQUE: Multidetector CT imaging of the chest was performed using the standard protocol during bolus administration of intravenous contrast. Multiplanar CT image reconstructions and MIPs were obtained to evaluate the vascular anatomy. RADIATION DOSE REDUCTION: This exam was performed according to the departmental dose-optimization program which includes automated exposure control, adjustment of the mA and/or kV according to patient size and/or use of iterative reconstruction technique. CONTRAST:  179m OMNIPAQUE IOHEXOL 350 MG/ML SOLN COMPARISON:  Chest radiograph dated 09/18/2021. FINDINGS: Evaluation of this exam is limited due to respiratory motion artifact. Cardiovascular: There is no cardiomegaly or pericardial effusion. There is 3 vessel coronary vascular calcification and postsurgical changes of CABG. There is moderate atherosclerotic calcification of the thoracic aorta. No aneurysmal dilatation. No pulmonary artery embolus identified. Mediastinum/Nodes: Mildly enlarged right hilar lymph node measures 11 mm short axis. No mediastinal adenopathy. The esophagus is grossly unremarkable. No mediastinal fluid collection. Lungs/Pleura: Background of predominantly paraseptal emphysema with right upper lobe subpleural scarring. Patchy area of ground-glass density in the right upper lobe consistent with pneumonia. There is a 2 cm central cavitary component noted concerning for a cavitary infection clinical  correlation is recommended. Several additional small scattered ground-glass nodules predominantly in the superior segment of the lower lobes bilaterally suspicious for additional foci of infection. There is no pleural effusion or pneumothorax. The central airways are patent. Small mucous secretion noted in the right mainstem bronchus. Upper Abdomen: Small ascites.  Cholecystectomy.  Cirrhosis. Musculoskeletal: Osteopenia with degenerative changes of the spine. Median sternotomy wires. No acute osseous pathology. Review of the MIP images confirms the above findings. IMPRESSION: 1. No CT evidence of pulmonary artery embolus. 2. Right upper lobe cavitary pneumonia. 3. Mildly enlarged right hilar lymph node, likely reactive. 4. Cirrhosis with small ascites. 5. Aortic Atherosclerosis (ICD10-I70.0) and Emphysema (ICD10-J43.9). Electronically Signed   By: AAnner CreteM.D.   On: 09/18/2021 22:01   DG Chest 2 View  Result Date: 09/18/2021 CLINICAL DATA:  Cough.  Bilateral leg swelling. EXAM: CHEST - 2 VIEW COMPARISON:  Chest radiographs 02/04/2018 FINDINGS: The cardiac silhouette is partially obscured but appears borderline to mildly enlarged. Prior CABG and aortic atherosclerosis are noted. Lung volumes are diminished with increased interstitial densities bilaterally as well as more confluent, hazy opacity in the right upper lobe. Biapical pleural thickening is noted. No sizable pleural effusion or pneumothorax is identified. No acute osseous abnormality is seen. IMPRESSION: Low lung volumes with bilateral interstitial and hazy right upper lobe opacities which may reflect pneumonia or edema. Electronically Signed   By: ALogan BoresM.D.   On: 09/18/2021 16:55        Scheduled Meds:  amoxicillin-clavulanate  1 tablet Oral BID   enoxaparin (LOVENOX) injection  40 mg Subcutaneous Q24H   midodrine  10 mg Oral TID WC   Continuous Infusions:        KAline August MD [facility] 09/20/2021, 7:46  AM

## 2021-09-20 NOTE — Progress Notes (Signed)
Pt refused cpap tonight said he is fine without it. He said he doesn't use his regularly at home.

## 2021-09-20 NOTE — Progress Notes (Signed)
  Transition of Care (TOC) Screening Note   Patient Details  Name: Joseph Bryan Date of Birth: 24-Jul-1940   Transition of Care [facility]) CM/SW Contact:    Roseanne Kaufman, RN Phone Number: 09/20/2021, 3:45 PM    Transition of Care Department [facility]) has reviewed patient and no TOC needs have been identified at this time. We will continue to monitor patient advancement through interdisciplinary progression rounds. If new patient transition needs arise, please place a TOC consult.

## 2021-09-20 NOTE — Progress Notes (Addendum)
Rounding Note    Patient Name: Joseph Bryan Date of Encounter: 09/20/2021  [place] Cardiologist: Shelva Majestic, MD   Subjective   No chest pain -no SOB could rest flat last pm   Inpatient Medications    Scheduled Meds:  amoxicillin-clavulanate  1 tablet Oral BID   enoxaparin (LOVENOX) injection  40 mg Subcutaneous Q24H   midodrine  10 mg Oral TID WC   Continuous Infusions:  PRN Meds:    Vital Signs    Vitals:   09/19/21 2000 09/19/21 2016 09/20/21 0500 09/20/21 0533  BP:  125/69  (!) 107/58  Pulse:  67  (!) 102  Resp: '14 18  18  '$ Temp:  99.5 F (37.5 C)  98.8 F (37.1 C)  TempSrc:  Oral  Oral  SpO2:  98%  95%  Weight:   119.3 kg     Intake/Output Summary (Last 24 hours) at 09/20/2021 0745 Last data filed at 09/20/2021 0600 Gross per 24 hour  Intake 685 ml  Output 1225 ml  Net -540 ml      09/20/2021    5:00 AM 09/19/2021    1:23 AM 09/18/2021    2:11 PM  Last 3 Weights  Weight (lbs) 263 lb 0.1 oz 261 lb 0.4 oz 266 lb 9.6 oz  Weight (kg) 119.3 kg 118.4 kg 120.929 kg      Telemetry    SR with PACs - Personally Reviewed  ECG    No new - Personally Reviewed  Physical Exam   GEN: No acute distress.   Neck: No JVD sitting up Cardiac: RRR, no murmurs, rubs, or gallops.  Respiratory: few rales Lt base to auscultation bilaterally. GI: Soft, nontender, non-distended  MS: 3+ lower ext edema to mid thigh; No deformity. Neuro:  Nonfocal  Psych: Normal affect   Labs    High Sensitivity Troponin:  No results for input(s): "TROPONINIHS" in the last 720 hours.   Chemistry Recent Labs  Lab 09/18/21 1626 09/19/21 0429 09/20/21 0343  NA 131* 131* 131*  K 4.4 3.5 3.7  CL 97* 98 97*  CO2 '26 25 27  '$ GLUCOSE 108* 98 119*  BUN 28* 29* 29*  CREATININE 1.35* 1.24 1.09  CALCIUM 8.4* 8.2* 7.8*  MG  --   --  1.9  PROT  --  5.9*  --   ALBUMIN  --  1.9*  --   AST  --  38  --   ALT  --  21  --   ALKPHOS  --  101  --   BILITOT  --  3.2*  --    GFRNONAA 53* 58* >60  ANIONGAP '8 8 7    '$ Lipids No results for input(s): "CHOL", "TRIG", "HDL", "LABVLDL", "LDLCALC", "CHOLHDL" in the last 168 hours.  Hematology Recent Labs  Lab 09/18/21 1626 09/19/21 0429 09/20/21 0343  WBC 16.1* 13.1* 11.4*  RBC 3.49* 3.26* 3.20*  HGB 11.5* 10.6* 10.6*  HCT 34.7* 32.6* 31.2*  MCV 99.4 100.0 97.5  MCH 33.0 32.5 33.1  MCHC 33.1 32.5 34.0  RDW 17.0* 17.0* 17.2*  PLT 125* 108* 134*   Thyroid No results for input(s): "TSH", "FREET4" in the last 168 hours.  BNP Recent Labs  Lab 09/18/21 1626  BNP 388.3*    DDimer No results for input(s): "DDIMER" in the last 168 hours.   Radiology    ECHOCARDIOGRAM COMPLETE  Result Date: 09/19/2021    ECHOCARDIOGRAM REPORT   Patient Name:   Joseph Bryan  Kreps Date of Exam: 09/19/2021 Medical Rec #:  [id number]      Height:       73.0 in Accession #:    [id number]     Weight:       261.0 lb Date of Birth:  02/08/1940      BSA:          2.409 m Patient Age:    81 years       BP:           100/45 mmHg Patient Gender: M              HR:           66 bpm. Exam Location:  Inpatient Procedure: 2D Echo, Cardiac Doppler, Color Doppler and Intracardiac            Opacification Agent Indications:    CHF  History:        Patient has prior history of Echocardiogram examinations, most                 recent 01/12/2020. CAD; Risk Factors:Hypertension, Dyslipidemia                 and Sleep Apnea.  Sonographer:    Jefferey Pica Referring Phys: [id number] [place]  1. Left ventricular ejection fraction, by estimation, is 45 to 50%. The left ventricle has mildly decreased function. The left ventricle demonstrates global hypokinesis. The left ventricular internal cavity size was mildly dilated. Left ventricular diastolic parameters are consistent with Grade I diastolic dysfunction (impaired relaxation).  2. Right ventricular systolic function is normal. The right ventricular size is normal. There is mildly elevated pulmonary  artery systolic pressure.  3. Left atrial size was mildly dilated.  4. Right atrial size was mildly dilated.  5. The mitral valve is normal in structure. Trivial mitral valve regurgitation. No evidence of mitral stenosis.  6. Tricuspid valve regurgitation is moderate.  7. The aortic valve is tricuspid. Aortic valve regurgitation is trivial. No aortic stenosis is present.  8. The inferior vena cava is normal in size with greater than 50% respiratory variability, suggesting right atrial pressure of 3 mmHg. FINDINGS  Left Ventricle: Left ventricular ejection fraction, by estimation, is 45 to 50%. The left ventricle has mildly decreased function. The left ventricle demonstrates global hypokinesis. The left ventricular internal cavity size was mildly dilated. There is  no left ventricular hypertrophy. Left ventricular diastolic parameters are consistent with Grade I diastolic dysfunction (impaired relaxation). Right Ventricle: The right ventricular size is normal. Right ventricular systolic function is normal. There is mildly elevated pulmonary artery systolic pressure. The tricuspid regurgitant velocity is 3.11 m/s, and with an assumed right atrial pressure of 3 mmHg, the estimated right ventricular systolic pressure is 24.2 mmHg. Left Atrium: Left atrial size was mildly dilated. Right Atrium: Right atrial size was mildly dilated. Pericardium: There is no evidence of pericardial effusion. Mitral Valve: The mitral valve is normal in structure. Trivial mitral valve regurgitation. No evidence of mitral valve stenosis. Tricuspid Valve: The tricuspid valve is normal in structure. Tricuspid valve regurgitation is moderate . No evidence of tricuspid stenosis. Aortic Valve: The aortic valve is tricuspid. Aortic valve regurgitation is trivial. Aortic regurgitation PHT measures 343 msec. No aortic stenosis is present. Aortic valve peak gradient measures 6.9 mmHg. Pulmonic Valve: The pulmonic valve was normal in structure.  Pulmonic valve regurgitation is trivial. No evidence of pulmonic stenosis. Aorta: The aortic root is normal in size and structure.  Venous: The inferior vena cava is normal in size with greater than 50% respiratory variability, suggesting right atrial pressure of 3 mmHg. IAS/Shunts: No atrial level shunt detected by color flow Doppler.  LEFT VENTRICLE PLAX 2D LVIDd:         5.50 cm   Diastology LVIDs:         4.50 cm   LV e' medial:    7.05 cm/s LV PW:         0.90 cm   LV E/e' medial:  11.5 LV IVS:        0.80 cm   LV e' lateral:   7.07 cm/s LVOT diam:     2.10 cm   LV E/e' lateral: 11.4 LV SV:         85 LV SV Index:   35 LVOT Area:     3.46 cm  RIGHT VENTRICLE             IVC RV Basal diam:  3.20 cm     IVC diam: 1.70 cm RV S prime:     11.10 cm/s TAPSE (M-mode): 2.6 cm LEFT ATRIUM             Index        RIGHT ATRIUM           Index LA diam:        4.90 cm 2.03 cm/m   RA Area:     22.70 cm LA Vol (A2C):   87.6 ml 36.36 ml/m  RA Volume:   78.60 ml  32.62 ml/m LA Vol (A4C):   71.5 ml 29.67 ml/m LA Biplane Vol: 81.4 ml 33.78 ml/m  AORTIC VALVE                 PULMONIC VALVE AV Area (Vmax): 3.25 cm     PV Vmax:       0.89 m/s AV Vmax:        131.00 cm/s  PV Peak grad:  3.2 mmHg AV Peak Grad:   6.9 mmHg LVOT Vmax:      123.00 cm/s LVOT Vmean:     75.150 cm/s LVOT VTI:       0.246 m AI PHT:         343 msec  AORTA Ao Root diam: 3.40 cm Ao Asc diam:  2.95 cm MITRAL VALVE                TRICUSPID VALVE MV Area (PHT): 4.39 cm     TR Peak grad:   38.7 mmHg MV Decel Time: 173 msec     TR Vmax:        311.00 cm/s MV E velocity: 80.80 cm/s MV A velocity: 103.00 cm/s  SHUNTS MV E/A ratio:  0.78         Systemic VTI:  0.25 m                             Systemic Diam: 2.10 cm Kirk Ruths MD Electronically signed by Kirk Ruths MD Signature Date/Time: 09/19/2021/4:01:46 PM    Final    CT Angio Chest PE W and/or Wo Contrast  Result Date: 09/18/2021 CLINICAL DATA:  Concern for pulmonary embolism. EXAM: CT  ANGIOGRAPHY CHEST WITH CONTRAST TECHNIQUE: Multidetector CT imaging of the chest was performed using the standard protocol during bolus administration of intravenous contrast. Multiplanar CT image reconstructions and MIPs were obtained to evaluate the vascular anatomy. RADIATION DOSE  REDUCTION: This exam was performed according to the departmental dose-optimization program which includes automated exposure control, adjustment of the mA and/or kV according to patient size and/or use of iterative reconstruction technique. CONTRAST:  124m OMNIPAQUE IOHEXOL 350 MG/ML SOLN COMPARISON:  Chest radiograph dated 09/18/2021. FINDINGS: Evaluation of this exam is limited due to respiratory motion artifact. Cardiovascular: There is no cardiomegaly or pericardial effusion. There is 3 vessel coronary vascular calcification and postsurgical changes of CABG. There is moderate atherosclerotic calcification of the thoracic aorta. No aneurysmal dilatation. No pulmonary artery embolus identified. Mediastinum/Nodes: Mildly enlarged right hilar lymph node measures 11 mm short axis. No mediastinal adenopathy. The esophagus is grossly unremarkable. No mediastinal fluid collection. Lungs/Pleura: Background of predominantly paraseptal emphysema with right upper lobe subpleural scarring. Patchy area of ground-glass density in the right upper lobe consistent with pneumonia. There is a 2 cm central cavitary component noted concerning for a cavitary infection clinical correlation is recommended. Several additional small scattered ground-glass nodules predominantly in the superior segment of the lower lobes bilaterally suspicious for additional foci of infection. There is no pleural effusion or pneumothorax. The central airways are patent. Small mucous secretion noted in the right mainstem bronchus. Upper Abdomen: Small ascites.  Cholecystectomy.  Cirrhosis. Musculoskeletal: Osteopenia with degenerative changes of the spine. Median sternotomy  wires. No acute osseous pathology. Review of the MIP images confirms the above findings. IMPRESSION: 1. No CT evidence of pulmonary artery embolus. 2. Right upper lobe cavitary pneumonia. 3. Mildly enlarged right hilar lymph node, likely reactive. 4. Cirrhosis with small ascites. 5. Aortic Atherosclerosis (ICD10-I70.0) and Emphysema (ICD10-J43.9). Electronically Signed   By: AAnner CreteM.D.   On: 09/18/2021 22:01   DG Chest 2 View  Result Date: 09/18/2021 CLINICAL DATA:  Cough.  Bilateral leg swelling. EXAM: CHEST - 2 VIEW COMPARISON:  Chest radiographs 02/04/2018 FINDINGS: The cardiac silhouette is partially obscured but appears borderline to mildly enlarged. Prior CABG and aortic atherosclerosis are noted. Lung volumes are diminished with increased interstitial densities bilaterally as well as more confluent, hazy opacity in the right upper lobe. Biapical pleural thickening is noted. No sizable pleural effusion or pneumothorax is identified. No acute osseous abnormality is seen. IMPRESSION: Low lung volumes with bilateral interstitial and hazy right upper lobe opacities which may reflect pneumonia or edema. Electronically Signed   By: ALogan BoresM.D.   On: 09/18/2021 16:55    Cardiac Studies  ECHO 09/19/21 IMPRESSIONS     1. Left ventricular ejection fraction, by estimation, is 45 to 50%. The  left ventricle has mildly decreased function. The left ventricle  demonstrates global hypokinesis. The left ventricular internal cavity size  was mildly dilated. Left ventricular  diastolic parameters are consistent with Grade I diastolic dysfunction  (impaired relaxation).   2. Right ventricular systolic function is normal. The right ventricular  size is normal. There is mildly elevated pulmonary artery systolic  pressure.   3. Left atrial size was mildly dilated.   4. Right atrial size was mildly dilated.   5. The mitral valve is normal in structure. Trivial mitral valve  regurgitation. No  evidence of mitral stenosis.   6. Tricuspid valve regurgitation is moderate.   7. The aortic valve is tricuspid. Aortic valve regurgitation is trivial.  No aortic stenosis is present.   8. The inferior vena cava is normal in size with greater than 50%  respiratory variability, suggesting right atrial pressure of 3 mmHg.   FINDINGS   Left Ventricle: Left ventricular ejection fraction,  by estimation, is 45  to 50%. The left ventricle has mildly decreased function. The left  ventricle demonstrates global hypokinesis. The left ventricular internal  cavity size was mildly dilated. There is   no left ventricular hypertrophy. Left ventricular diastolic parameters  are consistent with Grade I diastolic dysfunction (impaired relaxation).   Right Ventricle: The right ventricular size is normal. Right ventricular  systolic function is normal. There is mildly elevated pulmonary artery  systolic pressure. The tricuspid regurgitant velocity is 3.11 m/s, and  with an assumed right atrial pressure  of 3 mmHg, the estimated right ventricular systolic pressure is 59.9 mmHg.   Left Atrium: Left atrial size was mildly dilated.   Right Atrium: Right atrial size was mildly dilated.   Pericardium: There is no evidence of pericardial effusion.   Mitral Valve: The mitral valve is normal in structure. Trivial mitral  valve regurgitation. No evidence of mitral valve stenosis.   Tricuspid Valve: The tricuspid valve is normal in structure. Tricuspid  valve regurgitation is moderate . No evidence of tricuspid stenosis.   Aortic Valve: The aortic valve is tricuspid. Aortic valve regurgitation is  trivial. Aortic regurgitation PHT measures 343 msec. No aortic stenosis is  present. Aortic valve peak gradient measures 6.9 mmHg.   Pulmonic Valve: The pulmonic valve was normal in structure. Pulmonic valve  regurgitation is trivial. No evidence of pulmonic stenosis.   Aorta: The aortic root is normal in size and  structure.   Venous: The inferior vena cava is normal in size with greater than 50%  respiratory variability, suggesting right atrial pressure of 3 mmHg.   IAS/Shunts: No atrial level shunt detected by color flow Doppler.    ECHO: 01/12/2020  1. Left ventricular ejection fraction, by estimation, is 45 to 50%. The  left ventricle has mildly decreased function. The left ventricle  demonstrates global hypokinesis. Left ventricular diastolic function could  not be evaluated.   2. Right ventricular systolic function was not well visualized. The right  ventricular size is normal. Tricuspid regurgitation signal is inadequate  for assessing PA pressure.   3. The mitral valve is degenerative. Mild mitral valve regurgitation. No  evidence of mitral stenosis.   4. The aortic valve is tricuspid. Aortic valve regurgitation is mild.  Mild aortic valve sclerosis is present, with no evidence of aortic valve  stenosis.    MYOVIEW: 08/20/2016 The left ventricular ejection fraction is moderately decreased (30-44%). Nuclear stress EF: 36%. There was no ST segment deviation noted during stress. There is a medium defect of moderate severity present in the apical anterior, apical septal and apex location. The defect is non-reversible. This is consistent with infarct. No ischemia noted. This is an intermediate risk study. There is evidence of transient ischemic dilatation with a TID of 1.27 which can be seen in multivessel CAD.   Patient Profile     81 y.o. male  hx of  CAD s/p PCI, CABG x 3 in 1998, RBBB, ICM, chronic systolic heart failure, nonalcoholic cirrhosis, hypertension, hyperlipidemia, CKD with atrophic Lf kidney, and OSA not on CPAP, who was admitted 09/06 for hypotension and increase LE edema and cards  following for CHF  Assessment & Plan     chronic systolic and diastolic HF.  -EF same 35-70% with G1DD.  -CT without fluid in lungs. -chronic lower ext edema continue compression  stockings.  Edema appears to have increased, will do ACE leg wraps and seq stockings for now, will give 40 mg IV lasix today  -  Neg 420 though wt is up 1 kg. (One dose of lasix on the 6th)  Rt upper lobe cavitary lung lesion on ABX per Pulmonary  Hypotensive and placed on midodrine (hypotensive in office on 09/18/21 as well) -Atenolol on hold along with spironolactone may need to reconsider dosing of BB  -today BP 107/58   CAD with hx of CABG X3 1998 -no angina continue ASA  Mod TR, trivial MR- monitor  HLD not on statin due to non alcoholic cirrhosis   OSA -pt was intolerant of CPAP and does not wear at home.  Refused last pm.      For questions or updates, please contact [place] Please consult [email or web address] for contact info under        Signed, Cecilie Kicks, NP  09/20/2021, 7:45 AM    Personally seen and examined. Agree with above.  Longstanding lower extremity edema. -Likely dependent edema  -Could consider vascular consultation as outpatient to see if there are any possible solutions to help.  Continue with conservative measures, leg elevation etc. -Be careful with ongoing diuretics.  Hypotension.  Hypotension - Home medications on hold.  Continue to hold atenolol, spironolactone  Coronary disease status post CABG -Stable continue with aspirin  Chronic systolic heart failure - EF 45 to 50% mildly reduced.  Continue to hold home regimen secondary to blood pressure  Candee Furbish, MD

## 2021-09-21 ENCOUNTER — Other Ambulatory Visit: Payer: Self-pay

## 2021-09-21 DIAGNOSIS — J189 Pneumonia, unspecified organism: Secondary | ICD-10-CM | POA: Diagnosis not present

## 2021-09-21 DIAGNOSIS — I5042 Chronic combined systolic (congestive) and diastolic (congestive) heart failure: Secondary | ICD-10-CM

## 2021-09-21 DIAGNOSIS — I959 Hypotension, unspecified: Secondary | ICD-10-CM | POA: Diagnosis not present

## 2021-09-21 DIAGNOSIS — I5020 Unspecified systolic (congestive) heart failure: Secondary | ICD-10-CM | POA: Diagnosis not present

## 2021-09-21 LAB — BASIC METABOLIC PANEL
Anion gap: 8 (ref 5–15)
BUN: 28 mg/dL — ABNORMAL HIGH (ref 8–23)
CO2: 26 mmol/L (ref 22–32)
Calcium: 7.8 mg/dL — ABNORMAL LOW (ref 8.9–10.3)
Chloride: 98 mmol/L (ref 98–111)
Creatinine, Ser: 0.98 mg/dL (ref 0.61–1.24)
GFR, Estimated: >60 mL/min (ref >60)
Glucose, Bld: 93 mg/dL (ref 70–99)
Potassium: 3 mmol/L — ABNORMAL LOW (ref 3.5–5.1)
Sodium: 132 mmol/L — ABNORMAL LOW (ref 135–145)

## 2021-09-21 LAB — MAGNESIUM: Magnesium: 1.9 mg/dL (ref 1.7–2.4)

## 2021-09-21 MED ORDER — TORSEMIDE 20 MG PO TABS: 40.0000 mg | ORAL_TABLET | Freq: Every day | ORAL | Status: DC | PRN

## 2021-09-21 MED ORDER — MIDODRINE HCL 10 MG PO TABS: 10.0000 mg | ORAL_TABLET | Freq: Three times a day (TID) | ORAL | 0 refills | Status: DC

## 2021-09-21 MED ORDER — POTASSIUM CHLORIDE CRYS ER 20 MEQ PO TBCR
40.0000 meq | EXTENDED_RELEASE_TABLET | ORAL | Status: AC
  Administered 2021-09-21 (×2): 40 meq via ORAL
  Filled 2021-09-21 (×2): qty 2

## 2021-09-21 MED ORDER — AMOXICILLIN-POT CLAVULANATE 875-125 MG PO TABS: 1.0000 | ORAL_TABLET | Freq: Two times a day (BID) | ORAL | 0 refills | Status: DC

## 2021-09-21 NOTE — Discharge Summary (Addendum)
Physician Discharge Summary  Joseph Bryan KWI:[id number] DOB: 1940-12-27 DOA: 09/18/2021  PCP: Joseph Downing, MD  Admit date: 09/18/2021 Discharge date: 09/21/2021  Admitted From: Home Disposition: Home  Recommendations for Outpatient Follow-up:  Follow up with PCP in 1 week with repeat CBC/BMP Outpatient follow-up with cardiology and pulmonary Follow up in ED if symptoms worsen or new appear   Home Health: HHPT Equipment/Devices: None  Discharge Condition: Guarded  CODE STATUS: Partial Diet recommendation: Heart healthy/fluid restriction of up to 1200 cc a day  Brief/Interim Summary: 81 y.o. male with medical history significant of CAD s/p PCI, CABG x 3 in 1998, RBBB, ICM, chronic systolic heart failure,nonalcoholic cirrhosis, hypertension, hyperlipidemia, CKD with atrophic Left kidney, and OSA on CPAP presented with hypotension and increased lower extremity swelling; sent by cardiology office for hypotension.  On presentation, he was hypotensive at times in the 90s over 40s; found to have WBC of 16.1; creatinine of 1.35.  BNP of 388.  CTA chest was negative for pulmonary embolism but showed right upper lobe cavitary pneumonia.  He was started on IV vancomycin and cefepime.  1 dose of IV Lasix given.  Pulmonary and cardiology were consulted.  During the hospitalization, he was started on midodrine.  Cardiology evaluated the patient and patient received a dose of IV Lasix on 09/20/2021 and had good diuresis.  Pulmonary put the patient on oral Augmentin for 4 weeks and recommended outpatient follow-up.  Cardiology has cleared the patient for discharge today and has cleared the patient to resume his home diuretic regimen.  Discharge patient home today.  Outpatient follow-up with PCP/pulmonary/cardiology.    Discharge Diagnoses:   Hypotension History of hypertension -Patient was started on midodrine; blood pressure currently stable.  Continue midodrine on discharge.  Outpatient follow-up  with PCP and cardiology.  Acute on chronic systolic heart failure -Presented with worsening bilateral lower extremity edema -Received 1 dose of IV Lasix on presentation.  Cardiology following.  2D echo showed EF of 45 to 50% with grade 1 diastolic dysfunction.  Received another dose of IV Lasix on 09/20/2021 and had good diuresis. -Cardiology has cleared the patient for discharge today and has cleared the patient to resume his home diuretic regimen.  Discharge patient home today.   -Continue diet and fluid restriction   Cavitary pneumonia -Continue Augmentin for 4 weeks and outpatient follow-up with pulmonary.   Chronic kidney disease stage IIIa -Acute kidney injury has been ruled out.  Creatinine at baseline.  Monitor   Hyponatremia -Mild.  Outpatient follow-up  Hypokalemia -Replace prior to discharge.    Hypoalbuminemia -Encourage oral intake.  Outpatient follow-up.   Leukocytosis -Only mild.  Was improving.   Thrombocytopenia -No signs of bleeding.  Outpatient follow-up.  History of CAD status post CABG -No chest pain.  Currently stable.  Outpatient follow-up with cardiology.  Obesity -Outpatient follow-up   Discharge Instructions  Discharge Instructions     Diet - low sodium heart healthy   Complete by: As directed    Increase activity slowly   Complete by: As directed    No wound care   Complete by: As directed       Allergies as of 09/21/2021       Reactions   Other Rash, Other (See Comments)   CHG soap and wipes patient states today(06/21/19), he does not recall this allergy. He completed the pre-op chg baths without any problems.        Medication List     STOP taking  these medications    atenolol 50 MG tablet Commonly known as: TENORMIN   Oxycodone HCl 10 MG Tabs       TAKE these medications    amoxicillin-clavulanate 875-125 MG tablet Commonly known as: AUGMENTIN Take 1 tablet by mouth 2 (two) times daily.   aspirin EC 81 MG  tablet Take 1 tablet (81 mg total) by mouth daily. Swallow whole.   BEE POLLEN PO Take 3 capsules by mouth daily.   ibuprofen 200 MG tablet Commonly known as: ADVIL Take 200 mg by mouth as needed (pain).   midodrine 10 MG tablet Commonly known as: PROAMATINE Take 1 tablet (10 mg total) by mouth 3 (three) times daily with meals.   pantoprazole 40 MG tablet Commonly known as: PROTONIX Take 40 mg by mouth daily.   spironolactone 50 MG tablet Commonly known as: ALDACTONE Take 50 mg by mouth daily.   torsemide 20 MG tablet Commonly known as: DEMADEX Take 2 tablets (40 mg total) by mouth daily as needed (fluid).   vitamin B-12 100 MCG tablet Commonly known as: CYANOCOBALAMIN Take 100 mcg by mouth daily.   vitamin C 1000 MG tablet Take 1,000 mg by mouth daily.        Follow-up Information     Monge, Helane Gunther, NP Follow up.   Specialties: Nurse Practitioner, Family Medicine Why: Hospital follow-up with Cardiology scheduled for 10/02/2021 at 10:05am. Please arrive 15 minutes early for check-in. If this date/time does not work for you, please call our office to reschedule. Contact information: [address] [place] [address] [phone]         Joseph Downing, MD. Schedule an appointment as soon as possible for a visit in 1 week(s).   Specialty: Family Medicine Contact information: [place] [address] [phone]         Joseph Sine, MD .   Specialty: Cardiology Contact information: [address] [place] [address] [phone]                Allergies  Allergen Reactions   Other Rash and Other (See Comments)    CHG soap and wipes patient states today(06/21/19), he does not recall this allergy. He completed the pre-op chg baths without any problems.    Consultations: Cardiology   Procedures/Studies: ECHOCARDIOGRAM COMPLETE  Result Date: 09/19/2021    ECHOCARDIOGRAM REPORT    Patient Name:   Joseph Bryan Date of Exam: 09/19/2021 Medical Rec #:  [id number]      Height:       73.0 in Accession #:    [id number]     Weight:       261.0 lb Date of Birth:  03-03-1940      BSA:          2.409 m Patient Age:    81 years       BP:           100/45 mmHg Patient Gender: M              HR:           66 bpm. Exam Location:  Inpatient Procedure: 2D Echo, Cardiac Doppler, Color Doppler and Intracardiac            Opacification Agent Indications:    CHF  History:        Patient has prior history of Echocardiogram examinations, most  recent 01/12/2020. CAD; Risk Factors:Hypertension, Dyslipidemia                 and Sleep Apnea.  Sonographer:    Jefferey Pica Referring Phys: [id number] [place]  1. Left ventricular ejection fraction, by estimation, is 45 to 50%. The left ventricle has mildly decreased function. The left ventricle demonstrates global hypokinesis. The left ventricular internal cavity size was mildly dilated. Left ventricular diastolic parameters are consistent with Grade I diastolic dysfunction (impaired relaxation).  2. Right ventricular systolic function is normal. The right ventricular size is normal. There is mildly elevated pulmonary artery systolic pressure.  3. Left atrial size was mildly dilated.  4. Right atrial size was mildly dilated.  5. The mitral valve is normal in structure. Trivial mitral valve regurgitation. No evidence of mitral stenosis.  6. Tricuspid valve regurgitation is moderate.  7. The aortic valve is tricuspid. Aortic valve regurgitation is trivial. No aortic stenosis is present.  8. The inferior vena cava is normal in size with greater than 50% respiratory variability, suggesting right atrial pressure of 3 mmHg. FINDINGS  Left Ventricle: Left ventricular ejection fraction, by estimation, is 45 to 50%. The left ventricle has mildly decreased function. The left ventricle demonstrates global hypokinesis. The left ventricular internal  cavity size was mildly dilated. There is  no left ventricular hypertrophy. Left ventricular diastolic parameters are consistent with Grade I diastolic dysfunction (impaired relaxation). Right Ventricle: The right ventricular size is normal. Right ventricular systolic function is normal. There is mildly elevated pulmonary artery systolic pressure. The tricuspid regurgitant velocity is 3.11 m/s, and with an assumed right atrial pressure of 3 mmHg, the estimated right ventricular systolic pressure is 58.0 mmHg. Left Atrium: Left atrial size was mildly dilated. Right Atrium: Right atrial size was mildly dilated. Pericardium: There is no evidence of pericardial effusion. Mitral Valve: The mitral valve is normal in structure. Trivial mitral valve regurgitation. No evidence of mitral valve stenosis. Tricuspid Valve: The tricuspid valve is normal in structure. Tricuspid valve regurgitation is moderate . No evidence of tricuspid stenosis. Aortic Valve: The aortic valve is tricuspid. Aortic valve regurgitation is trivial. Aortic regurgitation PHT measures 343 msec. No aortic stenosis is present. Aortic valve peak gradient measures 6.9 mmHg. Pulmonic Valve: The pulmonic valve was normal in structure. Pulmonic valve regurgitation is trivial. No evidence of pulmonic stenosis. Aorta: The aortic root is normal in size and structure. Venous: The inferior vena cava is normal in size with greater than 50% respiratory variability, suggesting right atrial pressure of 3 mmHg. IAS/Shunts: No atrial level shunt detected by color flow Doppler.  LEFT VENTRICLE PLAX 2D LVIDd:         5.50 cm   Diastology LVIDs:         4.50 cm   LV e' medial:    7.05 cm/s LV PW:         0.90 cm   LV E/e' medial:  11.5 LV IVS:        0.80 cm   LV e' lateral:   7.07 cm/s LVOT diam:     2.10 cm   LV E/e' lateral: 11.4 LV SV:         85 LV SV Index:   35 LVOT Area:     3.46 cm  RIGHT VENTRICLE             IVC RV Basal diam:  3.20 cm     IVC diam: 1.70 cm RV S  prime:  11.10 cm/s TAPSE (M-mode): 2.6 cm LEFT ATRIUM             Index        RIGHT ATRIUM           Index LA diam:        4.90 cm 2.03 cm/m   RA Area:     22.70 cm LA Vol (A2C):   87.6 ml 36.36 ml/m  RA Volume:   78.60 ml  32.62 ml/m LA Vol (A4C):   71.5 ml 29.67 ml/m LA Biplane Vol: 81.4 ml 33.78 ml/m  AORTIC VALVE                 PULMONIC VALVE AV Area (Vmax): 3.25 cm     PV Vmax:       0.89 m/s AV Vmax:        131.00 cm/s  PV Peak grad:  3.2 mmHg AV Peak Grad:   6.9 mmHg LVOT Vmax:      123.00 cm/s LVOT Vmean:     75.150 cm/s LVOT VTI:       0.246 m AI PHT:         343 msec  AORTA Ao Root diam: 3.40 cm Ao Asc diam:  2.95 cm MITRAL VALVE                TRICUSPID VALVE MV Area (PHT): 4.39 cm     TR Peak grad:   38.7 mmHg MV Decel Time: 173 msec     TR Vmax:        311.00 cm/s MV E velocity: 80.80 cm/s MV A velocity: 103.00 cm/s  SHUNTS MV E/A ratio:  0.78         Systemic VTI:  0.25 m                             Systemic Diam: 2.10 cm Kirk Ruths MD Electronically signed by Kirk Ruths MD Signature Date/Time: 09/19/2021/4:01:46 PM    Final    CT Angio Chest PE W and/or Wo Contrast  Result Date: 09/18/2021 CLINICAL DATA:  Concern for pulmonary embolism. EXAM: CT ANGIOGRAPHY CHEST WITH CONTRAST TECHNIQUE: Multidetector CT imaging of the chest was performed using the standard protocol during bolus administration of intravenous contrast. Multiplanar CT image reconstructions and MIPs were obtained to evaluate the vascular anatomy. RADIATION DOSE REDUCTION: This exam was performed according to the departmental dose-optimization program which includes automated exposure control, adjustment of the mA and/or kV according to patient size and/or use of iterative reconstruction technique. CONTRAST:  141m OMNIPAQUE IOHEXOL 350 MG/ML SOLN COMPARISON:  Chest radiograph dated 09/18/2021. FINDINGS: Evaluation of this exam is limited due to respiratory motion artifact. Cardiovascular: There is no cardiomegaly or  pericardial effusion. There is 3 vessel coronary vascular calcification and postsurgical changes of CABG. There is moderate atherosclerotic calcification of the thoracic aorta. No aneurysmal dilatation. No pulmonary artery embolus identified. Mediastinum/Nodes: Mildly enlarged right hilar lymph node measures 11 mm short axis. No mediastinal adenopathy. The esophagus is grossly unremarkable. No mediastinal fluid collection. Lungs/Pleura: Background of predominantly paraseptal emphysema with right upper lobe subpleural scarring. Patchy area of ground-glass density in the right upper lobe consistent with pneumonia. There is a 2 cm central cavitary component noted concerning for a cavitary infection clinical correlation is recommended. Several additional small scattered ground-glass nodules predominantly in the superior segment of the lower lobes bilaterally suspicious for additional foci of infection. There is no  pleural effusion or pneumothorax. The central airways are patent. Small mucous secretion noted in the right mainstem bronchus. Upper Abdomen: Small ascites.  Cholecystectomy.  Cirrhosis. Musculoskeletal: Osteopenia with degenerative changes of the spine. Median sternotomy wires. No acute osseous pathology. Review of the MIP images confirms the above findings. IMPRESSION: 1. No CT evidence of pulmonary artery embolus. 2. Right upper lobe cavitary pneumonia. 3. Mildly enlarged right hilar lymph node, likely reactive. 4. Cirrhosis with small ascites. 5. Aortic Atherosclerosis (ICD10-I70.0) and Emphysema (ICD10-J43.9). Electronically Signed   By: Anner Crete M.D.   On: 09/18/2021 22:01   DG Chest 2 View  Result Date: 09/18/2021 CLINICAL DATA:  Cough.  Bilateral leg swelling. EXAM: CHEST - 2 VIEW COMPARISON:  Chest radiographs 02/04/2018 FINDINGS: The cardiac silhouette is partially obscured but appears borderline to mildly enlarged. Prior CABG and aortic atherosclerosis are noted. Lung volumes are  diminished with increased interstitial densities bilaterally as well as more confluent, hazy opacity in the right upper lobe. Biapical pleural thickening is noted. No sizable pleural effusion or pneumothorax is identified. No acute osseous abnormality is seen. IMPRESSION: Low lung volumes with bilateral interstitial and hazy right upper lobe opacities which may reflect pneumonia or edema. Electronically Signed   By: Logan Bores M.D.   On: 09/18/2021 16:55      Subjective: Patient seen and examined at bedside.  Slow to respond, poor historian.  Wife at bedside.  Patient denies any fever, chest pain or worsening shortness of breath.  Discharge Exam: Vitals:   09/21/21 0100 09/21/21 0650  BP:  (!) 117/55  Pulse:  71  Resp: 18 17  Temp:  98.3 F (36.8 C)  SpO2:  94%    General: Pt is alert, awake, not in acute distress.  Chronically ill and deconditioned.  Currently on room air. Cardiovascular: rate controlled, S1/S2 + Respiratory: bilateral decreased breath sounds at bases with basilar crackles Abdominal: Soft, obese, NT, ND, bowel sounds + Extremities: Bilateral lower extremities are wrapped; no cyanosis    The results of significant diagnostics from this hospitalization (including imaging, microbiology, ancillary and laboratory) are listed below for reference.     Microbiology: No results found for this or any previous visit (from the past 240 hour(s)).   Labs: BNP (last 3 results) Recent Labs    09/18/21 1626  BNP 518.8*   Basic Metabolic Panel: Recent Labs  Lab 09/18/21 1626 09/19/21 0429 09/20/21 0343 09/21/21 0429  NA 131* 131* 131* 132*  K 4.4 3.5 3.7 3.0*  CL 97* 98 97* 98  CO2 '26 25 27 26  '$ GLUCOSE 108* 98 119* 93  BUN 28* 29* 29* 28*  CREATININE 1.35* 1.24 1.09 0.98  CALCIUM 8.4* 8.2* 7.8* 7.8*  MG  --   --  1.9 1.9   Liver Function Tests: Recent Labs  Lab 09/19/21 0429  AST 38  ALT 21  ALKPHOS 101  BILITOT 3.2*  PROT 5.9*  ALBUMIN 1.9*   No  results for input(s): "LIPASE", "AMYLASE" in the last 168 hours. No results for input(s): "AMMONIA" in the last 168 hours. CBC: Recent Labs  Lab 09/18/21 1626 09/19/21 0429 09/20/21 0343  WBC 16.1* 13.1* 11.4*  NEUTROABS 12.9*  --  8.6*  HGB 11.5* 10.6* 10.6*  HCT 34.7* 32.6* 31.2*  MCV 99.4 100.0 97.5  PLT 125* 108* 134*   Cardiac Enzymes: No results for input(s): "CKTOTAL", "CKMB", "CKMBINDEX", "TROPONINI" in the last 168 hours. BNP: Invalid input(s): "POCBNP" CBG: No results for input(s): "GLUCAP" in  the last 168 hours. D-Dimer No results for input(s): "DDIMER" in the last 72 hours. Hgb A1c No results for input(s): "HGBA1C" in the last 72 hours. Lipid Profile No results for input(s): "CHOL", "HDL", "LDLCALC", "TRIG", "CHOLHDL", "LDLDIRECT" in the last 72 hours. Thyroid function studies No results for input(s): "TSH", "T4TOTAL", "T3FREE", "THYROIDAB" in the last 72 hours.  Invalid input(s): "FREET3" Anemia work up No results for input(s): "VITAMINB12", "FOLATE", "FERRITIN", "TIBC", "IRON", "RETICCTPCT" in the last 72 hours. Urinalysis    Component Value Date/Time   COLORURINE STRAW (A) 11/06/2019 1319   APPEARANCEUR CLEAR 11/06/2019 1319   LABSPEC 1.020 11/06/2019 1319   PHURINE 8.0 11/06/2019 1319   GLUCOSEU NEGATIVE 11/06/2019 1319   HGBUR NEGATIVE 11/06/2019 1319   BILIRUBINUR NEGATIVE 11/06/2019 1319   KETONESUR 5 (A) 11/06/2019 1319   PROTEINUR NEGATIVE 11/06/2019 1319   UROBILINOGEN 0.2 08/25/2012 1726   NITRITE NEGATIVE 11/06/2019 1319   LEUKOCYTESUR NEGATIVE 11/06/2019 1319   Sepsis Labs Recent Labs  Lab 09/18/21 1626 09/19/21 0429 09/20/21 0343  WBC 16.1* 13.1* 11.4*   Microbiology No results found for this or any previous visit (from the past 240 hour(s)).   Time coordinating discharge: 35 minutes  SIGNED:   Aline August, MD  [facility] 09/21/2021, 10:47 AM

## 2021-09-21 NOTE — TOC Transition Note (Signed)
Transition of Care [facility]) - CM/SW Discharge Note   Patient Details  Name: Joseph Bryan MRN: [id number] Date of Birth: July 06, 1940  [facility] CM/SW Contact:  Ross Ludwig, LCSW Phone Number: 09/21/2021, 1:53 PM   Clinical Narrative:     Patient will be going home with home health PT through [facility].  CSW signing off please reconsult with any other social work needs, home health agency has been notified of planned discharge.   Final next level of care: [place] Barriers to Discharge: Barriers Resolved   Patient Goals and CMS Choice Patient states their goals for this hospitalization and ongoing recovery are:: To return back home with home health PT CMS Medicare.gov Compare Post Acute Care list provided to:: Patient Choice offered to / list presented to : Patient  Discharge Placement  Patient going home with home health PT through [place].                     Discharge Plan and [place] with home healht.                        HH Arranged: PT   Date HH Agency Contacted: 09/21/21 Time HH Agency Contacted: [phone]    Social Determinants of Health (SDOH) Interventions     Readmission Risk Interventions     No data to display

## 2021-09-21 NOTE — Progress Notes (Signed)
Progress Note  Patient Name: Joseph Bryan Date of Encounter: 09/21/2021  [facility] Cardiologist: Shelva Majestic, MD   Subjective   Denies any chest pain or shortness of breath this morning.  Cardiology seeing for chronic lower extremity edema.  Urine output 1 L yesterday and is 1.4 L net negative since admission  Inpatient Medications    Scheduled Meds:  amoxicillin-clavulanate  1 tablet Oral BID   enoxaparin (LOVENOX) injection  40 mg Subcutaneous Q24H   midodrine  10 mg Oral TID WC   Continuous Infusions:  PRN Meds:    Vital Signs    Vitals:   09/20/21 0533 09/20/21 1211 09/20/21 2209 09/21/21 0100  BP: (!) 107/58 (!) 114/58 (!) 121/56   Pulse: (!) 102 (!) 57 78   Resp: '18 19 18 18  '$ Temp: 98.8 F (37.1 C) 98.4 F (36.9 C) 98.6 F (37 C)   TempSrc: Oral Oral Oral   SpO2: 95% 93% 94%   Weight:        Intake/Output Summary (Last 24 hours) at 09/21/2021 0603 Last data filed at 09/21/2021 0135 Gross per 24 hour  Intake 220 ml  Output 1250 ml  Net -1030 ml      09/20/2021    5:00 AM 09/19/2021    1:23 AM 09/18/2021    2:11 PM  Last 3 Weights  Weight (lbs) 263 lb 0.1 oz 261 lb 0.4 oz 266 lb 9.6 oz  Weight (kg) 119.3 kg 118.4 kg 120.929 kg      Telemetry    Normal sinus rhythm- Personally Reviewed  ECG    No new EKG to review- Personally Reviewed  Physical Exam   GEN: No acute distress.   Neck: No JVD Cardiac: RRR, no murmurs, rubs, or gallops.  Respiratory: Clear to auscultation bilaterally. GI: Soft, nontender, non-distended  MS: 3+ bilateral lower extremity edema; No deformity. Neuro:  Nonfocal  Psych: Normal affect   Labs    High Sensitivity Troponin:  No results for input(s): "TROPONINIHS" in the last 720 hours.    Chemistry Recent Labs  Lab 09/18/21 1626 09/19/21 0429 09/20/21 0343  NA 131* 131* 131*  K 4.4 3.5 3.7  CL 97* 98 97*  CO2 '26 25 27  '$ GLUCOSE 108* 98 119*  BUN 28* 29* 29*  CREATININE 1.35* 1.24 1.09  CALCIUM 8.4*  8.2* 7.8*  PROT  --  5.9*  --   ALBUMIN  --  1.9*  --   AST  --  38  --   ALT  --  21  --   ALKPHOS  --  101  --   BILITOT  --  3.2*  --   GFRNONAA 53* 58* >60  ANIONGAP '8 8 7     '$ Hematology Recent Labs  Lab 09/18/21 1626 09/19/21 0429 09/20/21 0343  WBC 16.1* 13.1* 11.4*  RBC 3.49* 3.26* 3.20*  HGB 11.5* 10.6* 10.6*  HCT 34.7* 32.6* 31.2*  MCV 99.4 100.0 97.5  MCH 33.0 32.5 33.1  MCHC 33.1 32.5 34.0  RDW 17.0* 17.0* 17.2*  PLT 125* 108* 134*    BNP Recent Labs  Lab 09/18/21 1626  BNP 388.3*     DDimer No results for input(s): "DDIMER" in the last 168 hours.   CHA2DS2-VASc Score =     This indicates a  % annual risk of stroke. The patient's score is based upon:        Radiology    ECHOCARDIOGRAM COMPLETE  Result Date: 09/19/2021  ECHOCARDIOGRAM REPORT   Patient Name:   ANGELA PLATNER Date of Exam: 09/19/2021 Medical Rec #:  [id number]      Height:       73.0 in Accession #:    [id number]     Weight:       261.0 lb Date of Birth:  01-05-1941      BSA:          2.409 m Patient Age:    10 years       BP:           100/45 mmHg Patient Gender: M              HR:           66 bpm. Exam Location:  Inpatient Procedure: 2D Echo, Cardiac Doppler, Color Doppler and Intracardiac            Opacification Agent Indications:    CHF  History:        Patient has prior history of Echocardiogram examinations, most                 recent 01/12/2020. CAD; Risk Factors:Hypertension, Dyslipidemia                 and Sleep Apnea.  Sonographer:    Jefferey Pica Referring Phys: [id number] [place]  1. Left ventricular ejection fraction, by estimation, is 45 to 50%. The left ventricle has mildly decreased function. The left ventricle demonstrates global hypokinesis. The left ventricular internal cavity size was mildly dilated. Left ventricular diastolic parameters are consistent with Grade I diastolic dysfunction (impaired relaxation).  2. Right ventricular systolic function is  normal. The right ventricular size is normal. There is mildly elevated pulmonary artery systolic pressure.  3. Left atrial size was mildly dilated.  4. Right atrial size was mildly dilated.  5. The mitral valve is normal in structure. Trivial mitral valve regurgitation. No evidence of mitral stenosis.  6. Tricuspid valve regurgitation is moderate.  7. The aortic valve is tricuspid. Aortic valve regurgitation is trivial. No aortic stenosis is present.  8. The inferior vena cava is normal in size with greater than 50% respiratory variability, suggesting right atrial pressure of 3 mmHg. FINDINGS  Left Ventricle: Left ventricular ejection fraction, by estimation, is 45 to 50%. The left ventricle has mildly decreased function. The left ventricle demonstrates global hypokinesis. The left ventricular internal cavity size was mildly dilated. There is  no left ventricular hypertrophy. Left ventricular diastolic parameters are consistent with Grade I diastolic dysfunction (impaired relaxation). Right Ventricle: The right ventricular size is normal. Right ventricular systolic function is normal. There is mildly elevated pulmonary artery systolic pressure. The tricuspid regurgitant velocity is 3.11 m/s, and with an assumed right atrial pressure of 3 mmHg, the estimated right ventricular systolic pressure is 80.9 mmHg. Left Atrium: Left atrial size was mildly dilated. Right Atrium: Right atrial size was mildly dilated. Pericardium: There is no evidence of pericardial effusion. Mitral Valve: The mitral valve is normal in structure. Trivial mitral valve regurgitation. No evidence of mitral valve stenosis. Tricuspid Valve: The tricuspid valve is normal in structure. Tricuspid valve regurgitation is moderate . No evidence of tricuspid stenosis. Aortic Valve: The aortic valve is tricuspid. Aortic valve regurgitation is trivial. Aortic regurgitation PHT measures 343 msec. No aortic stenosis is present. Aortic valve peak gradient  measures 6.9 mmHg. Pulmonic Valve: The pulmonic valve was normal in structure. Pulmonic valve regurgitation is trivial. No evidence of pulmonic stenosis.  Aorta: The aortic root is normal in size and structure. Venous: The inferior vena cava is normal in size with greater than 50% respiratory variability, suggesting right atrial pressure of 3 mmHg. IAS/Shunts: No atrial level shunt detected by color flow Doppler.  LEFT VENTRICLE PLAX 2D LVIDd:         5.50 cm   Diastology LVIDs:         4.50 cm   LV e' medial:    7.05 cm/s LV PW:         0.90 cm   LV E/e' medial:  11.5 LV IVS:        0.80 cm   LV e' lateral:   7.07 cm/s LVOT diam:     2.10 cm   LV E/e' lateral: 11.4 LV SV:         85 LV SV Index:   35 LVOT Area:     3.46 cm  RIGHT VENTRICLE             IVC RV Basal diam:  3.20 cm     IVC diam: 1.70 cm RV S prime:     11.10 cm/s TAPSE (M-mode): 2.6 cm LEFT ATRIUM             Index        RIGHT ATRIUM           Index LA diam:        4.90 cm 2.03 cm/m   RA Area:     22.70 cm LA Vol (A2C):   87.6 ml 36.36 ml/m  RA Volume:   78.60 ml  32.62 ml/m LA Vol (A4C):   71.5 ml 29.67 ml/m LA Biplane Vol: 81.4 ml 33.78 ml/m  AORTIC VALVE                 PULMONIC VALVE AV Area (Vmax): 3.25 cm     PV Vmax:       0.89 m/s AV Vmax:        131.00 cm/s  PV Peak grad:  3.2 mmHg AV Peak Grad:   6.9 mmHg LVOT Vmax:      123.00 cm/s LVOT Vmean:     75.150 cm/s LVOT VTI:       0.246 m AI PHT:         343 msec  AORTA Ao Root diam: 3.40 cm Ao Asc diam:  2.95 cm MITRAL VALVE                TRICUSPID VALVE MV Area (PHT): 4.39 cm     TR Peak grad:   38.7 mmHg MV Decel Time: 173 msec     TR Vmax:        311.00 cm/s MV E velocity: 80.80 cm/s MV A velocity: 103.00 cm/s  SHUNTS MV E/A ratio:  0.78         Systemic VTI:  0.25 m                             Systemic Diam: 2.10 cm Kirk Ruths MD Electronically signed by Kirk Ruths MD Signature Date/Time: 09/19/2021/4:01:46 PM    Final     Cardiac Studies   2D echo 09/20/2018 IMPRESSIONS     1. Left ventricular ejection fraction, by estimation, is 45 to 50%. The  left ventricle has mildly decreased function. The left ventricle  demonstrates global hypokinesis. The left ventricular internal cavity size  was mildly dilated. Left  ventricular  diastolic parameters are consistent with Grade I diastolic dysfunction  (impaired relaxation).   2. Right ventricular systolic function is normal. The right ventricular  size is normal. There is mildly elevated pulmonary artery systolic  pressure.   3. Left atrial size was mildly dilated.   4. Right atrial size was mildly dilated.   5. The mitral valve is normal in structure. Trivial mitral valve  regurgitation. No evidence of mitral stenosis.   6. Tricuspid valve regurgitation is moderate.   7. The aortic valve is tricuspid. Aortic valve regurgitation is trivial.  No aortic stenosis is present.   8. The inferior vena cava is normal in size with greater than 50%  respiratory variability, suggesting right atrial pressure of 3 mmHg.   Patient Profile     81 y.o. male  with a hx of  CAD s/p PCI, CABG x 3 in 1998, RBBB, ICM, chronic systolic heart failure, nonalcoholic cirrhosis, hypertension, hyperlipidemia, CKD with atrophic Lf kidney, and OSA not on CPAP, who was admitted 09/06 for hypotension and increase LE edema.     Cards asked to see 09/19/2021 for the evaluation of CHF at the request of Dr Starla Link.  Assessment & Plan    Chronic combined systolic/diastolic CHF: -2D echo this admission showed stable LV dysfunction with EF 45 to 50% and grade 1 diastolic dysfunction -Based on the CT, he does not have fluid in his lungs -His lower extremity edema is chronic and likely dependent edema >>there may be some venous insufficiency related to that or the cirrhosis may play a role -However, he only had a small amount of ascites a year ago -he needs lower extremity venous Dopplers to assess for venous insufficiency, could do DVT study or refer  to VVS -He is compliant with compression socks during the day and keeping his feet up at night -His BUN is up above baseline, creatinine stable at 1.09 yesterday -Currently not on diuretics -Continue to track daily weights and strict intake/output   2.  CAD: -He is not having any ischemic symptoms. -Prior to admission, he was on aspirin 81 mg daily, atenolol 50 mg daily, spironolactone and Demadex -All medications that affect his blood pressure are on hold   3.  Hypotension: -SBP had been in the 80s and 90s a few days ago -BP meds were on hold -BP now much improved 121/56 mmHg   4.  OSA not on CPAP -Patient said he just could not tolerate the machine -can work with the respiratory therapist to come up with a mask and usage that he can tolerate.   [place] will sign off.   Medication Recommendations: Restart home meds as blood pressure tolerates Other recommendations (labs, testing, etc): Compression hose Follow up as an outpatient: Outpatient referral to vein and vascular for lower extremity Dopplers to assess venous insufficiency  For questions or updates, please contact [place] Please consult [email or web address] for contact info under        Signed, Fransico Him, MD  09/21/2021, 6:03 AM

## 2021-09-30 ENCOUNTER — Emergency Department (HOSPITAL_COMMUNITY): Payer: Medicare Other

## 2021-09-30 ENCOUNTER — Encounter (HOSPITAL_COMMUNITY): Payer: Self-pay | Admitting: Emergency Medicine

## 2021-09-30 ENCOUNTER — Other Ambulatory Visit: Payer: Self-pay

## 2021-09-30 ENCOUNTER — Inpatient Hospital Stay (HOSPITAL_COMMUNITY)
Admission: EM | Admit: 2021-09-30 | Discharge: 2021-10-12 | DRG: 291 | Disposition: A | Discharge status: Hospice | Payer: Medicare Other | Attending: Internal Medicine | Admitting: Internal Medicine

## 2021-09-30 DIAGNOSIS — R54 Age-related physical debility: Secondary | ICD-10-CM | POA: Diagnosis present

## 2021-09-30 DIAGNOSIS — E669 Obesity, unspecified: Secondary | ICD-10-CM | POA: Diagnosis present

## 2021-09-30 DIAGNOSIS — Z951 Presence of aortocoronary bypass graft: Secondary | ICD-10-CM | POA: Diagnosis not present

## 2021-09-30 DIAGNOSIS — Z7982 Long term (current) use of aspirin: Secondary | ICD-10-CM

## 2021-09-30 DIAGNOSIS — I959 Hypotension, unspecified: Secondary | ICD-10-CM | POA: Diagnosis present

## 2021-09-30 DIAGNOSIS — J851 Abscess of lung with pneumonia: Secondary | ICD-10-CM | POA: Diagnosis present

## 2021-09-30 DIAGNOSIS — F419 Anxiety disorder, unspecified: Secondary | ICD-10-CM | POA: Diagnosis present

## 2021-09-30 DIAGNOSIS — I130 Hypertensive heart and chronic kidney disease with heart failure and stage 1 through stage 4 chronic kidney disease, or unspecified chronic kidney disease: Secondary | ICD-10-CM | POA: Diagnosis present

## 2021-09-30 DIAGNOSIS — Z6833 Body mass index (BMI) 33.0-33.9, adult: Secondary | ICD-10-CM | POA: Diagnosis not present

## 2021-09-30 DIAGNOSIS — Z20822 Contact with and (suspected) exposure to covid-19: Secondary | ICD-10-CM | POA: Diagnosis present

## 2021-09-30 DIAGNOSIS — J438 Other emphysema: Secondary | ICD-10-CM | POA: Diagnosis present

## 2021-09-30 DIAGNOSIS — E8809 Other disorders of plasma-protein metabolism, not elsewhere classified: Secondary | ICD-10-CM | POA: Diagnosis present

## 2021-09-30 DIAGNOSIS — R609 Edema, unspecified: Secondary | ICD-10-CM | POA: Diagnosis not present

## 2021-09-30 DIAGNOSIS — J690 Pneumonitis due to inhalation of food and vomit: Secondary | ICD-10-CM | POA: Diagnosis present

## 2021-09-30 DIAGNOSIS — E876 Hypokalemia: Secondary | ICD-10-CM | POA: Diagnosis present

## 2021-09-30 DIAGNOSIS — I4892 Unspecified atrial flutter: Secondary | ICD-10-CM | POA: Diagnosis present

## 2021-09-30 DIAGNOSIS — Z79899 Other long term (current) drug therapy: Secondary | ICD-10-CM

## 2021-09-30 DIAGNOSIS — I110 Hypertensive heart disease with heart failure: Secondary | ICD-10-CM | POA: Diagnosis not present

## 2021-09-30 DIAGNOSIS — R188 Other ascites: Secondary | ICD-10-CM | POA: Diagnosis present

## 2021-09-30 DIAGNOSIS — I272 Pulmonary hypertension, unspecified: Secondary | ICD-10-CM | POA: Diagnosis present

## 2021-09-30 DIAGNOSIS — G4733 Obstructive sleep apnea (adult) (pediatric): Secondary | ICD-10-CM | POA: Diagnosis not present

## 2021-09-30 DIAGNOSIS — I071 Rheumatic tricuspid insufficiency: Secondary | ICD-10-CM | POA: Diagnosis present

## 2021-09-30 DIAGNOSIS — I2510 Atherosclerotic heart disease of native coronary artery without angina pectoris: Secondary | ICD-10-CM | POA: Diagnosis present

## 2021-09-30 DIAGNOSIS — I10 Essential (primary) hypertension: Secondary | ICD-10-CM | POA: Diagnosis present

## 2021-09-30 DIAGNOSIS — I471 Supraventricular tachycardia: Secondary | ICD-10-CM | POA: Diagnosis present

## 2021-09-30 DIAGNOSIS — Z9049 Acquired absence of other specified parts of digestive tract: Secondary | ICD-10-CM

## 2021-09-30 DIAGNOSIS — I4719 Other supraventricular tachycardia: Secondary | ICD-10-CM

## 2021-09-30 DIAGNOSIS — R040 Epistaxis: Secondary | ICD-10-CM | POA: Diagnosis not present

## 2021-09-30 DIAGNOSIS — Z515 Encounter for palliative care: Secondary | ICD-10-CM

## 2021-09-30 DIAGNOSIS — K7469 Other cirrhosis of liver: Secondary | ICD-10-CM | POA: Diagnosis not present

## 2021-09-30 DIAGNOSIS — I5043 Acute on chronic combined systolic (congestive) and diastolic (congestive) heart failure: Secondary | ICD-10-CM | POA: Diagnosis present

## 2021-09-30 DIAGNOSIS — G629 Polyneuropathy, unspecified: Secondary | ICD-10-CM | POA: Diagnosis present

## 2021-09-30 DIAGNOSIS — E871 Hypo-osmolality and hyponatremia: Secondary | ICD-10-CM | POA: Diagnosis present

## 2021-09-30 DIAGNOSIS — J189 Pneumonia, unspecified organism: Secondary | ICD-10-CM | POA: Diagnosis not present

## 2021-09-30 DIAGNOSIS — I493 Ventricular premature depolarization: Secondary | ICD-10-CM | POA: Diagnosis present

## 2021-09-30 DIAGNOSIS — I4510 Unspecified right bundle-branch block: Secondary | ICD-10-CM | POA: Diagnosis present

## 2021-09-30 DIAGNOSIS — Z66 Do not resuscitate: Secondary | ICD-10-CM | POA: Diagnosis present

## 2021-09-30 DIAGNOSIS — N182 Chronic kidney disease, stage 2 (mild): Secondary | ICD-10-CM | POA: Diagnosis present

## 2021-09-30 DIAGNOSIS — E43 Unspecified severe protein-calorie malnutrition: Secondary | ICD-10-CM | POA: Diagnosis present

## 2021-09-30 DIAGNOSIS — E785 Hyperlipidemia, unspecified: Secondary | ICD-10-CM | POA: Diagnosis present

## 2021-09-30 DIAGNOSIS — I5023 Acute on chronic systolic (congestive) heart failure: Secondary | ICD-10-CM | POA: Diagnosis present

## 2021-09-30 DIAGNOSIS — I4891 Unspecified atrial fibrillation: Secondary | ICD-10-CM | POA: Diagnosis present

## 2021-09-30 DIAGNOSIS — Z9989 Dependence on other enabling machines and devices: Secondary | ICD-10-CM | POA: Diagnosis not present

## 2021-09-30 DIAGNOSIS — I82611 Acute embolism and thrombosis of superficial veins of right upper extremity: Secondary | ICD-10-CM | POA: Diagnosis present

## 2021-09-30 DIAGNOSIS — Z87891 Personal history of nicotine dependence: Secondary | ICD-10-CM | POA: Diagnosis not present

## 2021-09-30 DIAGNOSIS — J984 Other disorders of lung: Secondary | ICD-10-CM

## 2021-09-30 DIAGNOSIS — R601 Generalized edema: Secondary | ICD-10-CM | POA: Diagnosis not present

## 2021-09-30 DIAGNOSIS — I479 Paroxysmal tachycardia, unspecified: Secondary | ICD-10-CM | POA: Diagnosis present

## 2021-09-30 DIAGNOSIS — Z8249 Family history of ischemic heart disease and other diseases of the circulatory system: Secondary | ICD-10-CM

## 2021-09-30 DIAGNOSIS — H409 Unspecified glaucoma: Secondary | ICD-10-CM | POA: Diagnosis present

## 2021-09-30 DIAGNOSIS — Z87442 Personal history of urinary calculi: Secondary | ICD-10-CM

## 2021-09-30 DIAGNOSIS — Z7189 Other specified counseling: Secondary | ICD-10-CM | POA: Diagnosis not present

## 2021-09-30 DIAGNOSIS — J470 Bronchiectasis with acute lower respiratory infection: Secondary | ICD-10-CM | POA: Diagnosis present

## 2021-09-30 DIAGNOSIS — I252 Old myocardial infarction: Secondary | ICD-10-CM

## 2021-09-30 DIAGNOSIS — R1310 Dysphagia, unspecified: Secondary | ICD-10-CM | POA: Diagnosis present

## 2021-09-30 DIAGNOSIS — K7460 Unspecified cirrhosis of liver: Secondary | ICD-10-CM | POA: Diagnosis present

## 2021-09-30 DIAGNOSIS — R000 Tachycardia, unspecified: Secondary | ICD-10-CM | POA: Diagnosis not present

## 2021-09-30 DIAGNOSIS — R627 Adult failure to thrive: Secondary | ICD-10-CM | POA: Diagnosis present

## 2021-09-30 DIAGNOSIS — Z789 Other specified health status: Secondary | ICD-10-CM | POA: Diagnosis not present

## 2021-09-30 DIAGNOSIS — I255 Ischemic cardiomyopathy: Secondary | ICD-10-CM | POA: Diagnosis present

## 2021-09-30 DIAGNOSIS — I509 Heart failure, unspecified: Secondary | ICD-10-CM | POA: Diagnosis not present

## 2021-09-30 DIAGNOSIS — R638 Other symptoms and signs concerning food and fluid intake: Secondary | ICD-10-CM | POA: Diagnosis not present

## 2021-09-30 DIAGNOSIS — B961 Klebsiella pneumoniae [K. pneumoniae] as the cause of diseases classified elsewhere: Secondary | ICD-10-CM | POA: Diagnosis present

## 2021-09-30 DIAGNOSIS — K219 Gastro-esophageal reflux disease without esophagitis: Secondary | ICD-10-CM | POA: Diagnosis present

## 2021-09-30 DIAGNOSIS — J850 Gangrene and necrosis of lung: Secondary | ICD-10-CM | POA: Diagnosis not present

## 2021-09-30 HISTORY — DX: Heart failure, unspecified: I50.9

## 2021-09-30 LAB — COMPREHENSIVE METABOLIC PANEL
ALT: 25 U/L (ref 0–44)
AST: 59 U/L — ABNORMAL HIGH (ref 15–41)
Albumin: 1.8 g/dL — ABNORMAL LOW (ref 3.5–5.0)
Alkaline Phosphatase: 163 U/L — ABNORMAL HIGH (ref 38–126)
Anion gap: 7 (ref 5–15)
BUN: 17 mg/dL (ref 8–23)
CO2: 28 mmol/L (ref 22–32)
Calcium: 8.1 mg/dL — ABNORMAL LOW (ref 8.9–10.3)
Chloride: 90 mmol/L — ABNORMAL LOW (ref 98–111)
Creatinine, Ser: 1.18 mg/dL (ref 0.61–1.24)
GFR, Estimated: >60 mL/min (ref >60)
Glucose, Bld: 119 mg/dL — ABNORMAL HIGH (ref 70–99)
Potassium: 4.2 mmol/L (ref 3.5–5.1)
Sodium: 125 mmol/L — ABNORMAL LOW (ref 135–145)
Total Bilirubin: 2.5 mg/dL — ABNORMAL HIGH (ref 0.3–1.2)
Total Protein: 7.3 g/dL (ref 6.5–8.1)

## 2021-09-30 LAB — CBC WITH DIFFERENTIAL/PLATELET
Abs Immature Granulocytes: 0.16 10*3/uL — ABNORMAL HIGH (ref 0.00–0.07)
Basophils Absolute: 0.1 10*3/uL (ref 0.0–0.1)
Basophils Relative: 0 %
Eosinophils Absolute: 0.1 10*3/uL (ref 0.0–0.5)
Eosinophils Relative: 0 %
HCT: 35.9 % — ABNORMAL LOW (ref 39.0–52.0)
Hemoglobin: 12.1 g/dL — ABNORMAL LOW (ref 13.0–17.0)
Immature Granulocytes: 1 %
Lymphocytes Relative: 6 %
Lymphs Abs: 0.8 10*3/uL (ref 0.7–4.0)
MCH: 33.2 pg (ref 26.0–34.0)
MCHC: 33.7 g/dL (ref 30.0–36.0)
MCV: 98.4 fL (ref 80.0–100.0)
Monocytes Absolute: 1.2 10*3/uL — ABNORMAL HIGH (ref 0.1–1.0)
Monocytes Relative: 8 %
Neutro Abs: 12 10*3/uL — ABNORMAL HIGH (ref 1.7–7.7)
Neutrophils Relative %: 85 %
Platelets: 214 10*3/uL (ref 150–400)
RBC: 3.65 MIL/uL — ABNORMAL LOW (ref 4.22–5.81)
RDW: 16.5 % — ABNORMAL HIGH (ref 11.5–15.5)
WBC: 14.2 10*3/uL — ABNORMAL HIGH (ref 4.0–10.5)
nRBC: 0 % (ref 0.0–0.2)

## 2021-09-30 LAB — MAGNESIUM: Magnesium: 1.9 mg/dL (ref 1.7–2.4)

## 2021-09-30 LAB — BRAIN NATRIURETIC PEPTIDE: B Natriuretic Peptide: 125.8 pg/mL — ABNORMAL HIGH (ref 0.0–100.0)

## 2021-09-30 MED ORDER — VANCOMYCIN HCL 10 G IV SOLR
2250.0000 mg | Freq: Once | INTRAVENOUS | Status: AC
  Administered 2021-09-30: 2250 mg via INTRAVENOUS
  Filled 2021-09-30: qty 22.5

## 2021-09-30 MED ORDER — FUROSEMIDE 10 MG/ML IJ SOLN
80.0000 mg | Freq: Once | INTRAMUSCULAR | Status: AC
  Administered 2021-09-30: 80 mg via INTRAVENOUS
  Filled 2021-09-30: qty 8

## 2021-09-30 MED ORDER — VANCOMYCIN HCL 10 G IV SOLR
2250.0000 mg | Freq: Once | INTRAVENOUS | Status: DC
  Filled 2021-09-30: qty 22.5

## 2021-09-30 MED ORDER — SODIUM CHLORIDE 0.9 % IV SOLN
3.0000 g | Freq: Once | INTRAVENOUS | Status: AC
  Administered 2021-09-30: 3 g via INTRAVENOUS
  Filled 2021-09-30: qty 8

## 2021-09-30 MED ORDER — MIDODRINE HCL 5 MG PO TABS
10.0000 mg | ORAL_TABLET | Freq: Three times a day (TID) | ORAL | Status: DC
  Administered 2021-09-30 – 2021-10-10 (×29): 10 mg via ORAL
  Filled 2021-09-30 (×30): qty 2

## 2021-09-30 MED ORDER — METOPROLOL TARTRATE 5 MG/5ML IV SOLN
2.5000 mg | Freq: Once | INTRAVENOUS | Status: AC
Start: 2021-09-30 — End: 2021-09-30
  Administered 2021-09-30: 2.5 mg via INTRAVENOUS
  Filled 2021-09-30: qty 5

## 2021-09-30 MED ORDER — VANCOMYCIN HCL 1500 MG/300ML IV SOLN
1500.0000 mg | INTRAVENOUS | Status: DC
  Administered 2021-10-02 – 2021-10-04 (×4): 1500 mg via INTRAVENOUS
  Filled 2021-09-30 (×4): qty 300

## 2021-09-30 MED ORDER — ENOXAPARIN SODIUM 40 MG/0.4ML IJ SOSY
40.0000 mg | PREFILLED_SYRINGE | INTRAMUSCULAR | Status: DC
  Administered 2021-09-30: 40 mg via SUBCUTANEOUS
  Filled 2021-09-30: qty 0.4

## 2021-09-30 MED ORDER — IOHEXOL 350 MG/ML SOLN
75.0000 mL | Freq: Once | INTRAVENOUS | Status: AC | PRN
  Administered 2021-09-30: 75 mL via INTRAVENOUS

## 2021-09-30 MED ORDER — FUROSEMIDE 10 MG/ML IJ SOLN
80.0000 mg | Freq: Two times a day (BID) | INTRAMUSCULAR | Status: DC
  Administered 2021-10-01 – 2021-10-04 (×7): 80 mg via INTRAVENOUS
  Filled 2021-09-30 (×7): qty 8

## 2021-09-30 NOTE — Progress Notes (Signed)
Pharmacy Antibiotic Note  Joseph Bryan is a 81 y.o. male for which pharmacy has been consulted for vancomycin dosing for  cavitary pna .  Patient with a history of PCI/CABG, RBBB, chronic combined CHF/ischemic cardiomyopathy, nonalcoholic cirrhosis, HTN, HLD, CKD, OSA, GERD. Patient presenting with bilateral lower extremity edema.  Recent admission (9/6-9/9) decompensated HF. Discharged on augmentin x 4 weeks for cavitary pna.  CTA chest w/ increased size of cavitary lesion since last imaging.  SCr 1.18 WBC 14.2; T afeb; HR 121>83; RR 12  Plan: Unasyn per MD Vancomycin 2250 mg once then 1500 mg q24hr (eAUC 499.7) unless change in renal function Trend WBC, Fever, Renal function F/u cultures, clinical course, WBC, fever De-escalate when able Levels at steady state     Temp (24hrs), Avg:98 F (36.7 C), Min:98 F (36.7 C), Max:98 F (36.7 C)  Recent Labs  Lab 09/30/21 1510  WBC 14.2*  CREATININE 1.18    Estimated Creatinine Clearance: 66.3 mL/min (by C-G formula based on SCr of 1.18 mg/dL).    Allergies  Allergen Reactions   Other Rash and Other (See Comments)    CHG soap and wipes patient states today(06/21/19), he does not recall this allergy. He completed the pre-op chg baths without any problems.    Antimicrobials this admission: unasyn 9/18 >>  vancomycin 9/18 >>  Microbiology results: Pending  Thank you for allowing pharmacy to be a part of this patient's care.  Lorelei Pont, PharmD, BCPS 09/30/2021 7:23 PM ED Clinical Pharmacist -  [phone]

## 2021-09-30 NOTE — ED Provider Notes (Signed)
[facility] EMERGENCY DEPARTMENT Provider Note   CSN: [id number] Arrival date & time: 09/30/21  1429     History {Add pertinent medical, surgical, social history, OB history to HPI:1} Chief Complaint  Patient presents with   Leg Swelling    Joseph Bryan is a 81 y.o. male with HFrEF (EF 45% 9/23), hypertension, CAD status post CABG 1998 patent grafts on cath 2010, CKD, right bundle branch block, hyperlipidemia, OSA on CPAP, presents with leg swelling, generalized weakness.  Presents with wife and son-in-law.   Patient was recently admitted from 9/6-09/21/21 for hypotension and leg swelling, found to have right upper lobe cavitary pneumonia; started on midodrine during hospitalization, treated inpatient and discharge w/a course of oral Augmentin x4 weeks, currently taking. Today he presents with very swollen lower extremities, more so than there were when he was admitted recently. Patient unable to ambulate to restroom, which is usually his baseline. States that he cannot walk once he stands up.   Denies f/c, chest pain, SOB. Never had any SOB or cough prior to being diagnosed with pneumonia.   HR 130  HPI     Home Medications Prior to Admission medications   Medication Sig Start Date End Date Taking? Authorizing Provider  amoxicillin-clavulanate (AUGMENTIN) 875-125 MG tablet Take 1 tablet by mouth 2 (two) times daily. 09/21/21 10/28/2021  Aline August, MD  Ascorbic Acid (VITAMIN C) 1000 MG tablet Take 1,000 mg by mouth daily.    [provider]  aspirin EC 81 MG tablet Take 1 tablet (81 mg total) by mouth daily. Swallow whole. 12/15/19   Almyra Deforest, PA  BEE POLLEN PO Take 3 capsules by mouth daily.    [provider]  ibuprofen (ADVIL) 200 MG tablet Take 200 mg by mouth as needed (pain).    [provider]  midodrine (PROAMATINE) 10 MG tablet Take 1 tablet (10 mg total) by mouth 3 (three) times daily with meals. 09/21/21 10/17/2021  Aline August, MD  pantoprazole (PROTONIX) 40 MG tablet Take 40 mg by mouth daily.    [provider]  spironolactone (ALDACTONE) 50 MG tablet Take 50 mg by mouth daily. 12/14/19   [provider]  torsemide (DEMADEX) 20 MG tablet Take 2 tablets (40 mg total) by mouth daily as needed (fluid). 09/21/21   Aline August, MD  vitamin B-12 (CYANOCOBALAMIN) 100 MCG tablet Take 100 mcg by mouth daily.    [provider]      Allergies    Other    Review of Systems   Review of Systems Review of systems positive/negative for ***.  A 10 point review of systems was performed and is negative unless otherwise reported in HPI.  Physical Exam Updated Vital Signs BP 99/68 (BP Location: Right Arm)   Pulse (!) 124   Temp 98 F (36.7 C) (Oral)   Resp 16   SpO2 97%  Physical Exam General: Normal appearing ***male/male, lying in bed.  HEENT: PERRLA, Sclera anicteric, MMM, trachea midline. Cardiology: RRR, no murmurs/rubs/gallops. BL radial and DP pulses equal bilaterally.  Resp: Normal respiratory rate and effort. CTAB, no wheezes, rhonchi, crackles.  Abd: Soft, non-tender, non-distended. No rebound tenderness or guarding.  GU: Deferred. MSK: No peripheral edema or signs of trauma. Extremities without deformity or TTP. No cyanosis or clubbing. Skin: warm, dry. No rashes or lesions. Back: No CVA tenderness Neuro: A&Ox4, CNs II-XII grossly intact. MAEs. Sensation grossly intact.  Psych: Normal mood and affect.   ED Results /  Procedures / Treatments   Labs (all labs ordered are listed, but only abnormal results are displayed) Labs Reviewed - No data to display  EKG None  Radiology No results found.  Procedures Procedures  {Document cardiac monitor, telemetry assessment procedure when appropriate:1}  Medications Ordered in ED Medications    ED Course/ Medical Decision Making/ A&P                          Medical Decision Making Amount and/or Complexity of Data  Reviewed Labs: ordered. Radiology: ordered.  Risk Prescription drug management. Decision regarding hospitalization.    '@HNNMDM'$ @ ***  Patient's HR on EKG demonstrates likely aflutter 2:1, as he is steady at 130 bpm . Patient's BP soft at this time, will not give diltiazem bolus now ***.   I have personally reviewed and interpreted all labs and imaging.    Patient is signed out to the oncoming ED physician who is made aware of her history, presentation, exam, workup, and plan.       {Document critical care time when appropriate:1} {Document review of labs and clinical decision tools ie heart score, Chads2Vasc2 etc:1}  {Document your independent review of radiology images, and any outside records:1} {Document your discussion with family members, caretakers, and with consultants:1} {Document social determinants of health affecting pt's care:1} {Document your decision making why or why not admission, treatments were needed:1} Final Clinical Impression(s) / ED Diagnoses Final diagnoses:  None    Rx / DC Orders ED Discharge Orders     None        This note was created using dictation software, which may contain spelling or grammatical errors.

## 2021-09-30 NOTE — ED Provider Notes (Signed)
     Physical Exam  BP 118/66   Pulse (!) 120   Temp 98 F (36.7 C) (Oral)   Resp 15   SpO2 96%   Physical Exam  Procedures  Procedures  ED Course / MDM    81 year old male with history of coronary artery disease status post PCI, CABG x3 9098, right bundle branch block, hypertension, hyperlipidemia, mitral valve regurgitation, aortic valve regurgitation, bilateral lower extremity edema, OSA on CPAP, prior appendiceal cancer,, chronic systolic right heart failure, nonalcoholic cirrhosis, recent admission with finding of right upper lobe cavitary pneumonia for which she was given vancomycin, cefepime, had pulmonary and cardiology consults, was started on midodrine, had good diuresis with 1 dose of Lasix, and was put on oral Augmentin for 4 weeks, who presents with worsening leg swelling and generalized weakness.

## 2021-09-30 NOTE — H&P (Signed)
History and Physical    Joseph Bryan IWP:[id number] DOB: 02-08-1940 DOA: 09/30/2021  PCP: Leonard Downing, MD  Patient coming from: Home  Chief Complaint: Bilateral lower extremity edema  HPI: Joseph Bryan is a 81 y.o. male with medical history significant of CAD status post PCI and CABG, RBBB, chronic combined CHF/ischemic cardiomyopathy, nonalcoholic cirrhosis, hypertension, hyperlipidemia, CKD, OSA, GERD.  Recently admitted 9/6-9/9 decompensated CHF, Hypotension started on midodrine, and cavitary pneumonia discharged on Augmentin x4 weeks.    He presents to the ED today complaining of generalized weakness and bilateral lower extremity edema.  Tachycardic to the 120s and blood pressure soft.  Labs showing WBC 14.2, hemoglobin 12.1 (stable), sodium 125, chloride 90, glucose 119, creatinine 1.1 (at baseline), calcium 8.1, albumin 1.8, AST 59, ALT 25, alkaline phosphatase 163, total bilirubin 2.5, magnesium 1.9, BNP 125.    CTA chest negative for PE.  Showing a 6.8 x 5.0 cm cavitary lesion in the right upper lobe which is significantly increased in size compared to prior exam and most consistent with cavitary pneumonia or pulmonary abscess.  Hepatic cirrhosis with mild to moderate ascites.  ED physician discussed the case with Dr. Graylon Good from infectious disease, recommended obtaining sputum cultures and treating with vancomycin and Unasyn at this time. Cardiology consulted.  Patient was given IV metoprolol 2.5 mg, midodrine 10 mg, vancomycin, and Unasyn.  History provided by patient and his wife at bedside.  He has been taking torsemide 80 mg in the morning and 40 mg at bedtime but still having significant bilateral lower extremity edema extending up to his abdomen.  He denies shortness of breath or chest pain.  He is taking Augmentin as well.  Denies fevers or cough.  No recent travel or obvious TB risk factors.  No other complaints.  Denies nausea, vomiting, abdominal pain, or  diarrhea.  Review of Systems:  Review of Systems  All other systems reviewed and are negative.   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   Coronary artery disease    Glaucoma    History of kidney stones    Hyperlipidemia    Hypertension    Neuromuscular disorder (HCC)    neuropathy feet   Neuropathy    feet   Pneumonia    Renal insufficiency    Sleep apnea with use of continuous positive airway pressure (CPAP)    uses CPAP    Past Surgical History:  Procedure Laterality Date   APPENDECTOMY  2014   CHOLECYSTECTOMY N/A 06/21/2019   Procedure: LAPAROSCOPIC CHOLECYSTECTOMY WITH INTRAOPERATIVE CHOLANGIOGRAM;  Surgeon: Armandina Gemma, MD;  Location: [facility];  Service: General;  Laterality: N/A;   COLON RESECTION N/A 08/27/2012   Procedure: Diagnostic laparoscopy; Ileocecectomy;  Surgeon: Madilyn Hook, DO;  Location: [facility];  Service: General;  Laterality: N/A;   COLONOSCOPY WITH PROPOFOL N/A 04/09/2015   Procedure: COLONOSCOPY WITH PROPOFOL;  Surgeon: Garlan Fair, MD;  Location: [facility];  Service: Endoscopy;  Laterality: N/A;   CORONARY ARTERY BYPASS GRAFT  1998   x5   CYSTOSCOPY/URETEROSCOPY/HOLMIUM LASER/STENT PLACEMENT Right 02/25/2018   Procedure: RIGHT URETEROSCOPY/HOLMIUM LASER/ STONE REMOVAL  STENT PLACEMENT;  Surgeon: Ardis Hughs, MD;  Location: [facility];  Service: Urology;  Laterality: Right;   ERCP N/A 06/22/2019   Procedure: ENDOSCOPIC RETROGRADE CHOLANGIOPANCREATOGRAPHY (ERCP);  Surgeon:  Clarene Essex, MD;  Location: [facility];  Service: Endoscopy;  Laterality: N/A;   EYE SURGERY     cataract and glaucome   LUMBAR LAMINECTOMY/DECOMPRESSION MICRODISCECTOMY N/A 10/19/2015   Procedure: Lumbar three to four LAMINECTOMY/FORAMINOTOMY;  Surgeon: Erline Levine, MD;  Location: [facility];  Service: Neurosurgery;  Laterality: N/A;    LUNG SURGERY  yrs ago   right and 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 His smoking use included cigarettes. He has never used smokeless tobacco. He reports that he does not drink alcohol and does not use drugs.  Allergies  Allergen Reactions   Other Rash and Other (See Comments)    CHG soap and wipes patient states today(06/21/19), he does not recall this allergy. He completed the pre-op chg baths without any problems.    Family History  Problem Relation Age of Onset   Hypertension Father    Hypertension Sister    Heart attack Brother     Prior to Admission medications   Medication Sig Start Date End Date Taking? Authorizing Provider  amoxicillin-clavulanate (AUGMENTIN) 875-125 MG tablet Take 1 tablet by mouth 2 (two) times daily. 09/21/21 10/27/2021  Aline August, MD  Ascorbic Acid (VITAMIN C) 1000 MG tablet Take 1,000 mg by mouth daily.    [provider]  aspirin EC 81 MG tablet Take 1 tablet (81 mg total) by mouth daily. Swallow whole. 12/15/19   Almyra Deforest, PA  BEE POLLEN PO Take 3 capsules by mouth daily.    [provider]  ibuprofen (ADVIL) 200 MG tablet Take 200 mg by mouth as needed (pain).    [provider]  midodrine (PROAMATINE) 10 MG tablet Take 1 tablet (10 mg total) by mouth 3 (three) times daily with meals. 09/21/21 10/22/2021  Aline August, MD  pantoprazole (PROTONIX) 40 MG tablet Take 40 mg by mouth daily.    [provider]  spironolactone (ALDACTONE) 50 MG tablet Take 50 mg by mouth daily. 12/14/19   [provider]  torsemide (DEMADEX) 20 MG tablet Take 2 tablets (40 mg total) by mouth daily as needed  (fluid). 09/21/21   Aline August, MD  vitamin B-12 (CYANOCOBALAMIN) 100 MCG tablet Take 100 mcg by mouth daily.    [provider]    Physical Exam: Vitals:   09/30/21 1855 09/30/21 1858 09/30/21 1900 09/30/21 1915  BP: (!) 114/55 113/72 121/71   Pulse: 93 97 (!) 125 (!) 111  Resp: (!) '22 19 17 '$ (!) 21  Temp:      TempSrc:      SpO2: 94% 97% 95% 100%    Physical Exam Vitals reviewed.  Constitutional:      General: He is not in acute distress. HENT:     Head: Normocephalic and atraumatic.  Eyes:     Extraocular Movements: Extraocular movements intact.  Cardiovascular:     Rate and Rhythm: Normal rate and regular rhythm.     Pulses: Normal pulses.  Pulmonary:     Effort: Pulmonary effort is normal. No respiratory distress.     Breath sounds: Normal breath sounds. No wheezing or rales.  Abdominal:     General:  Bowel sounds are normal. There is distension.     Palpations: Abdomen is soft.     Tenderness: There is no abdominal tenderness. There is no guarding.  Musculoskeletal:     Cervical back: Normal range of motion.     Right lower leg: Edema present.     Left lower leg: Edema present.     Comments: +4 pitting edema of bilateral lower extremities extending up to the abdomen  Skin:    General: Skin is warm and dry.  Neurological:     General: No focal deficit present.     Mental Status: He is alert and oriented to person, place, and time.      Labs on Admission: I have personally reviewed following labs and imaging studies  CBC: Recent Labs  Lab 09/30/21 1510  WBC 14.2*  NEUTROABS 12.0*  HGB 12.1*  HCT 35.9*  MCV 98.4  PLT 027   Basic Metabolic Panel: Recent Labs  Lab 09/30/21 1510  NA 125*  K 4.2  CL 90*  CO2 28  GLUCOSE 119*  BUN 17  CREATININE 1.18  CALCIUM 8.1*  MG 1.9   GFR: Estimated Creatinine Clearance: 66.3 mL/min (by C-G formula based on SCr of 1.18 mg/dL). Liver Function Tests: Recent Labs  Lab 09/30/21 1510  AST 59*   ALT 25  ALKPHOS 163*  BILITOT 2.5*  PROT 7.3  ALBUMIN 1.8*   No results for input(s): "LIPASE", "AMYLASE" in the last 168 hours. No results for input(s): "AMMONIA" in the last 168 hours. Coagulation Profile: No results for input(s): "INR", "PROTIME" in the last 168 hours. Cardiac Enzymes: No results for input(s): "CKTOTAL", "CKMB", "CKMBINDEX", "TROPONINI" in the last 168 hours. BNP (last 3 results) No results for input(s): "PROBNP" in the last 8760 hours. HbA1C: No results for input(s): "HGBA1C" in the last 72 hours. CBG: No results for input(s): "GLUCAP" in the last 168 hours. Lipid Profile: No results for input(s): "CHOL", "HDL", "LDLCALC", "TRIG", "CHOLHDL", "LDLDIRECT" in the last 72 hours. Thyroid Function Tests: No results for input(s): "TSH", "T4TOTAL", "FREET4", "T3FREE", "THYROIDAB" in the last 72 hours. Anemia Panel: No results for input(s): "VITAMINB12", "FOLATE", "FERRITIN", "TIBC", "IRON", "RETICCTPCT" in the last 72 hours. Urine analysis:    Component Value Date/Time   COLORURINE STRAW (A) 11/06/2019 1319   APPEARANCEUR CLEAR 11/06/2019 1319   LABSPEC 1.020 11/06/2019 1319   PHURINE 8.0 11/06/2019 1319   GLUCOSEU NEGATIVE 11/06/2019 1319   HGBUR NEGATIVE 11/06/2019 1319   BILIRUBINUR NEGATIVE 11/06/2019 1319   KETONESUR 5 (A) 11/06/2019 1319   PROTEINUR NEGATIVE 11/06/2019 1319   UROBILINOGEN 0.2 08/25/2012 1726   NITRITE NEGATIVE 11/06/2019 1319   LEUKOCYTESUR NEGATIVE 11/06/2019 1319    Radiological Exams on Admission: CT Angio Chest PE W and/or Wo Contrast  Result Date: 09/30/2021 CLINICAL DATA:  Lower extremity edema, weakness. EXAM: CT ANGIOGRAPHY CHEST WITH CONTRAST TECHNIQUE: Multidetector CT imaging of the chest was performed using the standard protocol during bolus administration of intravenous contrast. Multiplanar CT image reconstructions and MIPs were obtained to evaluate the vascular anatomy. RADIATION DOSE REDUCTION: This exam was performed  according to the departmental dose-optimization program which includes automated exposure control, adjustment of the mA and/or kV according to patient size and/or use of iterative reconstruction technique. CONTRAST:  69m OMNIPAQUE IOHEXOL 350 MG/ML SOLN COMPARISON:  September 18, 2021. FINDINGS: Cardiovascular: Satisfactory opacification of the pulmonary arteries to the segmental level. No evidence of pulmonary embolism. Normal heart size. No pericardial effusion. Status post coronary bypass graft.  Atherosclerosis of thoracic aorta is noted without aneurysm or dissection. Mediastinum/Nodes: No enlarged mediastinal, hilar, or axillary lymph nodes. Thyroid gland, trachea, and esophagus demonstrate no significant findings. Lungs/Pleura: No pneumothorax or pleural effusion is noted. 6.8 x 5.0 cm cavitating lesion is noted in right upper lobe which is significantly increased in size compared to prior exam and most consistent with cavitating pneumonia or pulmonary abscess. Mild emphysematous disease is noted. Upper Abdomen: Hepatic cirrhosis with mild to moderate ascites. Musculoskeletal: No chest wall abnormality. No acute or significant osseous findings. Review of the MIP images confirms the above findings. IMPRESSION: No definite evidence of pulmonary embolus. 6.8 x 5.0 cm cavitating lesion is noted in right upper lobe which is significantly increased in size compared to prior exam and most consistent with cavitating pneumonia or pulmonary abscess. Hepatic cirrhosis with mild to moderate ascites. Aortic Atherosclerosis (ICD10-I70.0) and Emphysema (ICD10-J43.9). Electronically Signed   By: Marijo Conception M.D.   On: 09/30/2021 18:28    EKG: Independently reviewed.  Atrial tachycardia, QTc 522.  LAFB and RBBB are not new.  Assessment and Plan  Acute on chronic combined CHF Patient presenting with anasarca.  BNP likely falsely low in the setting of obesity.  Echo done 09/19/2021 showing EF 45 to 50% and grade 1  diastolic dysfunction. -Cardiology consulted, appreciate recommendations -IV Lasix 80 mg twice daily -Low-sodium diet with fluid restriction -Leg elevation and compression stockings -Monitor intake and output, daily weights -Monitor renal function  Cavitary pneumonia versus pulmonary abscess Diagnosed with cavitary pneumonia during recent hospitalization and was discharged on Augmentin. CT showing a 6.8 x 5.0 cm cavitary lesion in the right upper lobe which is significantly increased in size compared to prior exam and most consistent with cavitary pneumonia or pulmonary abscess.  No fever, mild leukocytosis improved on repeat labs.  No signs of sepsis at this time. -Continue vancomycin and Unasyn per ID recommendations -Sputum and blood cultures -Consult pulmonology in the morning.  CAD Cardiology feels he is not having any active signs of ischemia and not recommending any further work-up.  Hypotension -Continue midodrine  Tachycardia -Cardiology reviewed telemetry and felt it was sinus tachycardia.  Heart rate currently in the 90s. -Cardiac monitoring  Decompensated liver cirrhosis Mild to moderate ascites -Continue Lasix for diuresis and midodrine for hypotension -Resume home spironolactone if blood pressure is able to tolerate.  Hypervolemic hyponatremia Sodium 125 >129 after IV Lasix. -Continue to monitor  QT prolongation -Cardiac monitoring -Monitor potassium and magnesium levels -Avoid QT prolonging drugs if possible -Repeat EKG in a.m.  DVT prophylaxis: Lovenox given 9/18 at 2224.  Will hold any further doses in case pulmonology decides to pursue bronchoscopy during this hospitalization. Code Status: DNR (discussed with the patient) Family Communication: Wife at bedside. Level of care: Progressive Care Unit Admission status: It is my clinical opinion that admission to INPATIENT is reasonable and necessary because of the expectation that this patient will require  hospital care that crosses at least 2 midnights to treat this condition based on the medical complexity of the problems presented.  Given the aforementioned information, the predictability of an adverse outcome is felt to be significant.   Shela Leff MD [facility]  If 7PM-7AM, please contact night-coverage [email or web address]  09/30/2021, 7:48 PM

## 2021-09-30 NOTE — Consult Note (Addendum)
Cardiology Consultation:   Patient ID: Joseph Bryan; [id number]; 1940/08/02   Admit date: 09/30/2021 Date of Consult: 09/30/2021  Primary Care Provider: Leonard Downing, MD Primary Cardiologist: Shelva Majestic, MD  Primary Electrophysiologist:  None   Patient Profile:   Joseph Bryan is a 81 y.o. male with a hx of renal artery disease status post PCI and CABG x3 and then ischemic cardiomyopathy who was admitted with tachycardia and lower extremity swelling.  Who is being seen today for the evaluation of volume overload at the request of Dr. Billy Fischer.  History of Present Illness:   Joseph Bryan has a history of acute on chronic systolic and diastolic heart failure.  He was actually just discharged from [facility].  He has a cavitary pneumonia.   He was volume overloaded at the time of his admission recently.  He was treated with IV Lasix.  However, he was hypotensive so medical management was difficult.  He actually had to be managed with midodrine for his low blood pressure.  His ejection fraction was 45 to 50%.  This was unchanged from previous.  He was sent home with p.o. antibiotics for his cavitary lung lesion.  He was hypoalbuminemic, hypokalemic, hyponatremic.  CT angio of the chest demonstrates no pulmonary embolism but the right upper lobe cavitary lesion is increased in size compared with previous.  He has some moderate ascites and cirrhosis.  He is brought back to the ER today with worsening volume overload with increased lower extremity swelling.  His heart rate was 130.  Sodium which was 132 at discharge is now down to 125.  BNP is only mildly elevated at 125.8.  The patient reports that when he got home he tried sleeping in the bed.  However, he was too weak to pull himself up even though had some handrails.  He started sleeping in a chair.  He was able to lie fairly flat in the chair.  His wife says his weight is actually going down and she was able to measure  it up until the last couple of days.  He was able to get up and slowly ambulate to the bathroom but last night he was unable to stand or move.  His legs are just too heavy.  He does not think his lower extremity swelling is worse.  He is not describing any new shortness of breath.  He is not having any chest pressure, neck or arm discomfort.  He has had no cough fevers or chills.  He thought he was too weak when he left the hospital last time and is just gotten weaker.  Of note it does not sound like he is necessarily abusing fluid but he is drinking "low-salt" vegetable juice.   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   Coronary artery disease    Glaucoma    History of kidney stones    Hyperlipidemia    Hypertension    Neuromuscular disorder (HCC)    neuropathy feet   Neuropathy    feet   Pneumonia    Renal insufficiency    Sleep apnea with use of continuous positive airway pressure (CPAP)    uses CPAP    Past Surgical History:  Procedure Laterality Date   APPENDECTOMY  2014   CHOLECYSTECTOMY N/A 06/21/2019  Procedure: LAPAROSCOPIC CHOLECYSTECTOMY WITH INTRAOPERATIVE CHOLANGIOGRAM;  Surgeon: Armandina Gemma, MD;  Location: [facility];  Service: General;  Laterality: N/A;   COLON RESECTION N/A 08/27/2012   Procedure: Diagnostic laparoscopy; Ileocecectomy;  Surgeon: Madilyn Hook, DO;  Location: [facility];  Service: General;  Laterality: N/A;   COLONOSCOPY WITH PROPOFOL N/A 04/09/2015   Procedure: COLONOSCOPY WITH PROPOFOL;  Surgeon: Garlan Fair, MD;  Location: [facility];  Service: Endoscopy;  Laterality: N/A;   CORONARY ARTERY BYPASS GRAFT  1998   x5   CYSTOSCOPY/URETEROSCOPY/HOLMIUM LASER/STENT PLACEMENT Right 02/25/2018   Procedure: RIGHT URETEROSCOPY/HOLMIUM LASER/ STONE REMOVAL  STENT PLACEMENT;  Surgeon: Ardis Hughs, MD;  Location:  [facility];  Service: Urology;  Laterality: Right;   ERCP N/A 06/22/2019   Procedure: ENDOSCOPIC RETROGRADE CHOLANGIOPANCREATOGRAPHY (ERCP);  Surgeon: Clarene Essex, MD;  Location: [facility];  Service: Endoscopy;  Laterality: N/A;   EYE SURGERY     cataract and glaucome   LUMBAR LAMINECTOMY/DECOMPRESSION MICRODISCECTOMY N/A 10/19/2015   Procedure: Lumbar three to four LAMINECTOMY/FORAMINOTOMY;  Surgeon: Erline Levine, MD;  Location: [facility];  Service: Neurosurgery;  Laterality: N/A;   LUNG SURGERY  yrs ago   right and 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 Authorizing Provider  amoxicillin-clavulanate (AUGMENTIN) 875-125 MG tablet Take 1 tablet by mouth 2 (two) times daily. 09/21/21 10/13/2021  Aline August, MD  Ascorbic Acid (VITAMIN C) 1000 MG tablet Take 1,000 mg by mouth daily.    [provider]  aspirin EC 81 MG tablet Take 1 tablet (81 mg total) by mouth daily. Swallow whole. 12/15/19   Almyra Deforest, PA  BEE POLLEN PO Take 3 capsules by mouth daily.    [provider]  ibuprofen (ADVIL) 200 MG tablet Take 200 mg by mouth as needed (pain).    [provider]  midodrine (PROAMATINE) 10 MG tablet Take 1 tablet (10 mg total) by mouth 3 (three) times daily with meals. 09/21/21 10/15/2021  Aline August, MD  pantoprazole (PROTONIX) 40 MG tablet Take 40 mg by mouth daily.    [provider]  spironolactone (ALDACTONE) 50 MG tablet Take 50 mg by mouth daily. 12/14/19   [provider]  torsemide (DEMADEX) 20 MG tablet Take 2 tablets (40 mg total) by mouth daily as needed (fluid). 09/21/21   Aline August,  MD  vitamin B-12 (CYANOCOBALAMIN) 100 MCG tablet Take 100 mcg by mouth daily.    [provider]    Inpatient Medications: Scheduled Meds:  midodrine  10 mg Oral TID WC   Continuous Infusions:  ampicillin-sulbactam (UNASYN) IV     vancomycin     PRN Meds:   Allergies:    Allergies  Allergen Reactions   Other Rash and Other (See Comments)    CHG soap and wipes patient states today(06/21/19), he does not recall this allergy. He completed the pre-op chg baths without any problems.    Social History:   Social History   Socioeconomic History   Marital status: Married    Spouse name: Not on file   Number of children: Not on file   Years of  education: Not on file   Highest education level: Not on file  Occupational History   Not on file  Tobacco Use   Smoking status: Former    Types: Cigarettes    Quit date: 08/25/1988    Years since quitting: 33.1   Smokeless tobacco: Never  Vaping Use   Vaping Use: Never used  Substance and Sexual Activity   Alcohol use: No    Alcohol/week: 0.0 standard drinks of alcohol   Drug use: No   Sexual activity: Not Currently  Other Topics Concern   Not on file  Social History Narrative   Not on file   Social Determinants of Health   Financial Resource Strain: Not on file  Food Insecurity: Not on file  Transportation Needs: Not on file  Physical Activity: Not on file  Stress: Not on file  Social Connections: Not on file  Intimate Partner Violence: Not on file  He has 3 children.  Lives at home with his wife.  Is a retired Administrator.  Family History:    Family History  Problem Relation Age of Onset   Hypertension Father    Hypertension Sister    Heart attack Brother      ROS:  Please see the history of present illness.   All other ROS reviewed and negative.     Physical Exam/Data:   Vitals:   09/30/21 1855 09/30/21 1858 09/30/21 1900 09/30/21 1915  BP: (!) 114/55 113/72 121/71   Pulse: 93 97 (!) 125 (!) 111   Resp: (!) '22 19 17 '$ (!) 21  Temp:      TempSrc:      SpO2: 94% 97% 95% 100%   No intake or output data in the 24 hours ending 09/30/21 1951 There were no vitals filed for this visit. There is no height or weight on file to calculate BMI.  GENERAL: Chronically ill appearing HEENT:   Pupils equal round and reactive, fundi not visualized, oral mucosa unremarkable NECK:    Positive jugular venous distention, waveform within normal limits, carotid upstroke brisk and symmetric to the angle of the jaw at 45, no bruits, no thyromegaly LYMPHATICS:  No cervical, inguinal adenopathy LUNGS:   Bilateral decreased breath sounds without wheezing or crackles BACK:  No CVA tenderness CHEST:   Unremarkable HEART:  PMI not displaced or sustained,S1 and S2 within normal limits, no S3, no S4, no clicks, no rubs, no murmurs, distant heart sounds ABD: Mildly distended, positive bowel sounds normal in frequency in pitch, no bruits, no rebound, no guarding, no midline pulsatile mass, no hepatomegaly, no splenomegaly EXT:  2 plus pulses throughout, severe edema to the hips, no cyanosis no clubbing SKIN:  No rashes no nodules, chronic venous stasis changes NEURO:   Cranial nerves II through XII grossly intact, motor grossly intact throughout PSYCH:    Cognitively intact, oriented to person place and time   EKG:  The EKG was personally reviewed and demonstrates: Sinus tachycardia, rate 126, right bundle branch block, left intrafascicular block Telemetry:  Telemetry was personally reviewed and demonstrates: Sinus tachycardia with variable rates  Relevant CV Studies:   Echo:  09/19/2021   1. Left ventricular ejection fraction, by estimation, is 45 to 50%. The  left ventricle has mildly decreased function. The left ventricle  demonstrates global hypokinesis. The left ventricular internal cavity size  was mildly dilated. Left ventricular  diastolic parameters are consistent with Grade I diastolic dysfunction   (impaired relaxation).   2. Right ventricular systolic  function is normal. The right ventricular  size is normal. There is mildly elevated pulmonary artery systolic  pressure.   3. Left atrial size was mildly dilated.   4. Right atrial size was mildly dilated.   5. The mitral valve is normal in structure. Trivial mitral valve  regurgitation. No evidence of mitral stenosis.   6. Tricuspid valve regurgitation is moderate.   7. The aortic valve is tricuspid. Aortic valve regurgitation is trivial.  No aortic stenosis is present.   8. The inferior vena cava is normal in size with greater than 50%  respiratory variability, suggesting right atrial pressure of 3 mmHg.   Laboratory Data:  Chemistry Recent Labs  Lab 09/30/21 1510  NA 125*  K 4.2  CL 90*  CO2 28  GLUCOSE 119*  BUN 17  CREATININE 1.18  CALCIUM 8.1*  GFRNONAA >60  ANIONGAP 7    Recent Labs  Lab 09/30/21 1510  PROT 7.3  ALBUMIN 1.8*  AST 59*  ALT 25  ALKPHOS 163*  BILITOT 2.5*   Hematology Recent Labs  Lab 09/30/21 1510  WBC 14.2*  RBC 3.65*  HGB 12.1*  HCT 35.9*  MCV 98.4  MCH 33.2  MCHC 33.7  RDW 16.5*  PLT 214   Cardiac EnzymesNo results for input(s): "TROPONINI" in the last 168 hours. No results for input(s): "TROPIPOC" in the last 168 hours.  BNP Recent Labs  Lab 09/30/21 1510  BNP 125.8*    DDimer No results for input(s): "DDIMER" in the last 168 hours.  Radiology/Studies:  CT Angio Chest PE W and/or Wo Contrast  Result Date: 09/30/2021 CLINICAL DATA:  Lower extremity edema, weakness. EXAM: CT ANGIOGRAPHY CHEST WITH CONTRAST TECHNIQUE: Multidetector CT imaging of the chest was performed using the standard protocol during bolus administration of intravenous contrast. Multiplanar CT image reconstructions and MIPs were obtained to evaluate the vascular anatomy. RADIATION DOSE REDUCTION: This exam was performed according to the departmental dose-optimization program which includes automated  exposure control, adjustment of the mA and/or kV according to patient size and/or use of iterative reconstruction technique. CONTRAST:  75m OMNIPAQUE IOHEXOL 350 MG/ML SOLN COMPARISON:  September 18, 2021. FINDINGS: Cardiovascular: Satisfactory opacification of the pulmonary arteries to the segmental level. No evidence of pulmonary embolism. Normal heart size. No pericardial effusion. Status post coronary bypass graft. Atherosclerosis of thoracic aorta is noted without aneurysm or dissection. Mediastinum/Nodes: No enlarged mediastinal, hilar, or axillary lymph nodes. Thyroid gland, trachea, and esophagus demonstrate no significant findings. Lungs/Pleura: No pneumothorax or pleural effusion is noted. 6.8 x 5.0 cm cavitating lesion is noted in right upper lobe which is significantly increased in size compared to prior exam and most consistent with cavitating pneumonia or pulmonary abscess. Mild emphysematous disease is noted. Upper Abdomen: Hepatic cirrhosis with mild to moderate ascites. Musculoskeletal: No chest wall abnormality. No acute or significant osseous findings. Review of the MIP images confirms the above findings. IMPRESSION: No definite evidence of pulmonary embolus. 6.8 x 5.0 cm cavitating lesion is noted in right upper lobe which is significantly increased in size compared to prior exam and most consistent with cavitating pneumonia or pulmonary abscess. Hepatic cirrhosis with mild to moderate ascites. Aortic Atherosclerosis (ICD10-I70.0) and Emphysema (ICD10-J43.9). Electronically Signed   By: JMarijo ConceptionM.D.   On: 09/30/2021 18:28    Assessment and Plan:   Acute on chronic combined systolic and diastolic heart failure.  The patient presents with anasarca.  He is overall weakness as well.  He is having failure  to thrive with protein calorie malnutrition.  Therapies are limited because of his hypotension.  I think he needs to be admitted with IV Lasix (I would begin with 80 mg twice daily), 1200  cc fluid restriction, leg elevation and compression stockings.    We will follow for volume management.  Coronary artery disease: He has not had any active signs of ischemia.  No further work-up  Hypotension: Continue midodrine.  Again he will get IV diuresis in place of torsemide.  I would continue the spironolactone given the ascites.  Tachycardia: I reviewed telemetry.  This appears to be sinus tachycardia.  There is no abrupt onset or offset when his rate slows.  It is a gradual decrease with some ectopy.  He need to be followed on telemetry.   For questions or updates, please contact [place] Please consult [email or web address] for contact info under Cardiology/STEMI.   Signed, Minus Breeding, MD  09/30/2021 7:51 PM

## 2021-09-30 NOTE — ED Triage Notes (Signed)
Pt BIB [facility] from home reporting an increase in weakness and edema since discharge from [facility]. Patient unable to ambulate to restroom, which is usually his baseline. HR 130, afebrile, lung sounds clear.

## 2021-10-01 ENCOUNTER — Other Ambulatory Visit: Payer: Self-pay | Admitting: Internal Medicine

## 2021-10-01 DIAGNOSIS — E871 Hypo-osmolality and hyponatremia: Secondary | ICD-10-CM

## 2021-10-01 DIAGNOSIS — I5023 Acute on chronic systolic (congestive) heart failure: Secondary | ICD-10-CM

## 2021-10-01 DIAGNOSIS — J189 Pneumonia, unspecified organism: Secondary | ICD-10-CM

## 2021-10-01 DIAGNOSIS — J984 Other disorders of lung: Secondary | ICD-10-CM

## 2021-10-01 DIAGNOSIS — Z9989 Dependence on other enabling machines and devices: Secondary | ICD-10-CM

## 2021-10-01 DIAGNOSIS — G4733 Obstructive sleep apnea (adult) (pediatric): Secondary | ICD-10-CM

## 2021-10-01 DIAGNOSIS — I10 Essential (primary) hypertension: Secondary | ICD-10-CM

## 2021-10-01 DIAGNOSIS — I5043 Acute on chronic combined systolic (congestive) and diastolic (congestive) heart failure: Secondary | ICD-10-CM | POA: Diagnosis not present

## 2021-10-01 DIAGNOSIS — K7469 Other cirrhosis of liver: Secondary | ICD-10-CM

## 2021-10-01 DIAGNOSIS — I2510 Atherosclerotic heart disease of native coronary artery without angina pectoris: Secondary | ICD-10-CM

## 2021-10-01 DIAGNOSIS — R601 Generalized edema: Secondary | ICD-10-CM

## 2021-10-01 LAB — CBC WITH DIFFERENTIAL/PLATELET
Abs Immature Granulocytes: 0.09 10*3/uL — ABNORMAL HIGH (ref 0.00–0.07)
Basophils Absolute: 0.1 10*3/uL (ref 0.0–0.1)
Basophils Relative: 1 %
Eosinophils Absolute: 0.2 10*3/uL (ref 0.0–0.5)
Eosinophils Relative: 2 %
HCT: 34.1 % — ABNORMAL LOW (ref 39.0–52.0)
Hemoglobin: 11.6 g/dL — ABNORMAL LOW (ref 13.0–17.0)
Immature Granulocytes: 1 %
Lymphocytes Relative: 10 %
Lymphs Abs: 1.2 10*3/uL (ref 0.7–4.0)
MCH: 33.2 pg (ref 26.0–34.0)
MCHC: 34 g/dL (ref 30.0–36.0)
MCV: 97.7 fL (ref 80.0–100.0)
Monocytes Absolute: 1.3 10*3/uL — ABNORMAL HIGH (ref 0.1–1.0)
Monocytes Relative: 12 %
Neutro Abs: 8.7 10*3/uL — ABNORMAL HIGH (ref 1.7–7.7)
Neutrophils Relative %: 74 %
Platelets: 207 10*3/uL (ref 150–400)
RBC: 3.49 MIL/uL — ABNORMAL LOW (ref 4.22–5.81)
RDW: 16.4 % — ABNORMAL HIGH (ref 11.5–15.5)
WBC: 11.5 10*3/uL — ABNORMAL HIGH (ref 4.0–10.5)
nRBC: 0 % (ref 0.0–0.2)

## 2021-10-01 LAB — COMPREHENSIVE METABOLIC PANEL
ALT: 21 U/L (ref 0–44)
AST: 54 U/L — ABNORMAL HIGH (ref 15–41)
Albumin: 1.6 g/dL — ABNORMAL LOW (ref 3.5–5.0)
Alkaline Phosphatase: 152 U/L — ABNORMAL HIGH (ref 38–126)
Anion gap: 10 (ref 5–15)
BUN: 17 mg/dL (ref 8–23)
CO2: 28 mmol/L (ref 22–32)
Calcium: 8.1 mg/dL — ABNORMAL LOW (ref 8.9–10.3)
Chloride: 90 mmol/L — ABNORMAL LOW (ref 98–111)
Creatinine, Ser: 1.2 mg/dL (ref 0.61–1.24)
GFR, Estimated: >60 mL/min (ref >60)
Glucose, Bld: 159 mg/dL — ABNORMAL HIGH (ref 70–99)
Potassium: 3.5 mmol/L (ref 3.5–5.1)
Sodium: 128 mmol/L — ABNORMAL LOW (ref 135–145)
Total Bilirubin: 2 mg/dL — ABNORMAL HIGH (ref 0.3–1.2)
Total Protein: 7.1 g/dL (ref 6.5–8.1)

## 2021-10-01 LAB — BASIC METABOLIC PANEL
Anion gap: 11 (ref 5–15)
BUN: 17 mg/dL (ref 8–23)
CO2: 26 mmol/L (ref 22–32)
Calcium: 8.4 mg/dL — ABNORMAL LOW (ref 8.9–10.3)
Chloride: 92 mmol/L — ABNORMAL LOW (ref 98–111)
Creatinine, Ser: 1.13 mg/dL (ref 0.61–1.24)
GFR, Estimated: >60 mL/min (ref >60)
Glucose, Bld: 96 mg/dL (ref 70–99)
Potassium: 4.6 mmol/L (ref 3.5–5.1)
Sodium: 129 mmol/L — ABNORMAL LOW (ref 135–145)

## 2021-10-01 LAB — CBC
HCT: 38.7 % — ABNORMAL LOW (ref 39.0–52.0)
Hemoglobin: 12.9 g/dL — ABNORMAL LOW (ref 13.0–17.0)
MCH: 33.3 pg (ref 26.0–34.0)
MCHC: 33.3 g/dL (ref 30.0–36.0)
MCV: 100 fL (ref 80.0–100.0)
Platelets: 211 10*3/uL (ref 150–400)
RBC: 3.87 MIL/uL — ABNORMAL LOW (ref 4.22–5.81)
RDW: 16.5 % — ABNORMAL HIGH (ref 11.5–15.5)
WBC: 12.8 10*3/uL — ABNORMAL HIGH (ref 4.0–10.5)
nRBC: 0 % (ref 0.0–0.2)

## 2021-10-01 MED ORDER — SODIUM CHLORIDE 0.9 % IV SOLN
3.0000 g | Freq: Four times a day (QID) | INTRAVENOUS | Status: DC
  Administered 2021-10-01 – 2021-10-10 (×37): 3 g via INTRAVENOUS
  Filled 2021-10-01 (×40): qty 8

## 2021-10-01 NOTE — Progress Notes (Signed)
  Progress Note   Patient: Joseph Bryan YNW:[id number] DOB: [birth year] DOA: 09/30/2021     1 DOS: the patient was seen and examined on 10/01/2021   Brief hospital course: Mr. Counterman was admitted to the hospital with the working diagnosis of decompensated heart failure.   81 yo male with the past medical history of coronary artery disease, heart failure, non alcoholic liver cirrhosis, hypertension and CKD who presented with lower extremity edema. Recent hospitalization for heart failure 09/06 to 09/09 treated for heart failure and cavitary pneumonia. On his initial physical examination his blood pressure was 114/55, HR 93, RR 22 and 02 saturation 97%, lungs with no wheezing or rales, heart with S1 and S2 present and rhythmic, abdomen not distended and positive lower extremity edema.   Na 129, K 4,2 CL 90 bicarbonate 28 glucose 119 bun 17 cr 1,18  BNP 125  Wbc 14,2 hgb 12,1 plt 214   Ct chest with bilateral ground glass opacities, mild paraseptal emphysema and bronchiectasis. Positive right upper lobe 6.8 x 5.0 cm increased size cavitation pneumonia / pulmonary abscess.   EKG 125 bpm, left axis deviation, right bundle branch block, sinus with no significant ST segment or T wave changes, noisy baseline.    Assessment and Plan: * Acute on chronic systolic CHF (congestive heart failure) (HCC) Echocardiogram with reduced LV systolic function with EF 45 to 50%, global hypokinesis. Preserved RV systolic function, with moderate TR.   Continue with edema at his lower extremities.  Urine output is 116 mmHg  Plan to continue diuresis with IV furosemide.  Continue midodrine for blood pressure support.,    Cavitary pneumonia Patient with right upper lobe cavitary pneumonia, plan to resume IV antibiotic therapy Considering worsening lesion, may need collect samples for microbiology Will consult pulmonary patient may need bronchoscopy.   Hyponatremia Hypervolemic hyponatremia,   Plan to continue  diuresis with furosemide Patient had low protein malnutrition, consult nutrition for recommendations.  Follow up renal function and electrolytes in am.   CAD- s/p CABG in 1998 - patent grafts on cath in 2010 No chest pain  HTN (hypertension) Continue midodrine for blood pressure support.         Subjective: Patient with edema lower extremities, no worsening cough or dyspnea   Physical Exam: Vitals:   10/01/21 1030 10/01/21 1045 10/01/21 1154 10/01/21 1227  BP: 109/60  118/63 (!) 116/58  Pulse: 84 85 92 91  Resp:  '14 15 15  '$ Temp:   (!) 97.4 F (36.3 C) (!) 97.4 F (36.3 C)  TempSrc:   Oral Oral  SpO2: 95% 95% 96%   Weight:    115.7 kg  Height:    '6\' 1"'$  (1.854 m)   Neurology awake and alert ENT with pallor Cardiovascular with S1 and S2 present and rhythmic with no gallops No JVD Positive lower extremity edema +++ up to the hips Respiratory with no rales, rhonchi or wheezing Abdomen not distended  Data Reviewed:    Family Communication: I spoke with patient's wife at the bedside, we talked in detail about patient's condition, plan of care and prognosis and all questions were addressed.   Disposition: Status is: Inpatient Remains inpatient appropriate because: heart failure and worsening pneumonia   Planned Discharge Destination: Home    Author: Tawni Millers, MD 10/01/2021 4:21 PM  For on call review [email or web address].

## 2021-10-01 NOTE — Progress Notes (Unsigned)
{  Select_TRH_Note:[id number]} 

## 2021-10-01 NOTE — Assessment & Plan Note (Addendum)
Hypervolemic hyponatremia, hypomagnesemia   Volume status is improving, renal function with serum cr at 1,19 with K at 4,0 and serum bicarbonate at 29,  Na is 130. Mg 2,0   Diuretic therapy with furosemide, spironolactone and empagliflozin.

## 2021-10-01 NOTE — Assessment & Plan Note (Signed)
No chest pain

## 2021-10-01 NOTE — Plan of Care (Signed)
  Problem: Clinical Measurements: Goal: Diagnostic test results will improve Outcome: Progressing Goal: Respiratory complications will improve Outcome: Progressing   Problem: Safety: Goal: Ability to remain free from injury will improve Outcome: Progressing   

## 2021-10-01 NOTE — Assessment & Plan Note (Addendum)
Patient with right upper lobe cavitary pneumonia, Lesion size has increased despite antibiotic therapy for the last 14 days.  Patient has been afebrile and cultures with no growth. Wbc is 9,6  Oxymetry is 93% on room air.    Antibiotic therapy with IV Vancomycin and Unasyn.  Plan for further work up with bronchoscopy during this hospitalization.

## 2021-10-01 NOTE — Assessment & Plan Note (Signed)
Continue midodrine for blood pressure support.

## 2021-10-01 NOTE — Progress Notes (Signed)
Progress Note  Patient Name: Joseph Bryan Date of Encounter: 10/01/2021  Primary Cardiologist: Dr. Claiborne Billings  Subjective   Breathing better today, not dyspneic following initiation of IV diuresis last evening.  Inpatient Medications    Scheduled Meds:  furosemide  80 mg Intravenous BID   midodrine  10 mg Oral TID WC   Continuous Infusions:  ampicillin-sulbactam (UNASYN) IV Stopped (10/01/21 0834)   vancomycin     PRN Meds:    Vital Signs    Vitals:   10/01/21 1030 10/01/21 1045 10/01/21 1154 10/01/21 1227  BP: 109/60  118/63 (!) 116/58  Pulse: 84 85 92 91  Resp:  '14 15 15  '$ Temp:   (!) 97.4 F (36.3 C) (!) 97.4 F (36.3 C)  TempSrc:   Oral Oral  SpO2: 95% 95% 96%   Weight:    115.7 kg  Height:    '6\' 1"'$  (1.854 m)    Intake/Output Summary (Last 24 hours) at 10/01/2021 1334 Last data filed at 10/01/2021 0442 Gross per 24 hour  Intake 100 ml  Output 1000 ml  Net -900 ml    I/O since admission: -900  Filed Weights   10/01/21 1227  Weight: 115.7 kg    Telemetry    Sinus rhythm in the 90s- Personally Reviewed  ECG    09/30/2021 ECG (independently read by me): Sinus tachycardia at 118, RBBB, LAHB  Physical Exam   BP (!) 116/58 (BP Location: Right Arm)   Pulse 91   Temp (!) 97.4 F (36.3 C) (Oral)   Resp 15   Ht '6\' 1"'$  (1.854 m)   Wt 115.7 kg   SpO2 96%   BMI 33.65 kg/m  General: Alert, breathing better, no distress Skin: normal turgor, no rashes, warm and dry HEENT: Normocephalic, atraumatic. Pupils equal round and reactive to light; sclera anicteric; extraocular muscles intact;  Nose without nasal septal hypertrophy Mouth/Parynx benign; Mallinpatti scale 3 Neck: No JVD, no carotid bruits; normal carotid upstroke Lungs: Decreased breath sounds without wheezing or rhonchi Chest wall: without tenderness to palpitation Heart: PMI not displaced, RRR, s1 s2 normal, 1/6 systolic murmur, no diastolic murmur, no rubs, gallops, thrills, or  heaves Abdomen: Mildly distended, soft soft, nontender; BS+; abdominal aorta nontender and not dilated by palpation. Back: no CVA tenderness Pulses 2+ Musculoskeletal: full range of motion, normal strength, no joint deformities Extremities: 3+ bilateral edema up to the knees; mild above the knees; no clubbing cyanosis or edema, Homan's sign negative  Neurologic: grossly nonfocal; Cranial nerves grossly wnl Psychologic: Normal mood and affect   Labs    Chemistry Recent Labs  Lab 09/30/21 1510 10/01/21 0158  NA 125* 129*  K 4.2 4.6  CL 90* 92*  CO2 28 26  GLUCOSE 119* 96  BUN 17 17  CREATININE 1.18 1.13  CALCIUM 8.1* 8.4*  PROT 7.3  --   ALBUMIN 1.8*  --   AST 59*  --   ALT 25  --   ALKPHOS 163*  --   BILITOT 2.5*  --   GFRNONAA >60 >60  ANIONGAP 7 11     Hematology Recent Labs  Lab 09/30/21 1510 10/01/21 0158  WBC 14.2* 12.8*  RBC 3.65* 3.87*  HGB 12.1* 12.9*  HCT 35.9* 38.7*  MCV 98.4 100.0  MCH 33.2 33.3  MCHC 33.7 33.3  RDW 16.5* 16.5*  PLT 214 211    Cardiac EnzymesNo results for input(s): "TROPONINI" in the last 168 hours. No results for input(s): "  [place]" in the last 168 hours.   BNP Recent Labs  Lab 09/30/21 1510  BNP 125.8*     DDimer No results for input(s): "DDIMER" in the last 168 hours.   Lipid Panel     Component Value Date/Time   CHOL 144 10/28/2016 1155   TRIG 87 10/28/2016 1155   HDL 64 10/28/2016 1155   CHOLHDL 2.3 10/28/2016 1155   CHOLHDL 2.1 04/17/2016 1112   VLDL 18 04/17/2016 1112   LDLCALC 63 10/28/2016 1155     Radiology    CT Angio Chest PE W and/or Wo Contrast  Result Date: 09/30/2021 CLINICAL DATA:  Lower extremity edema, weakness. EXAM: CT ANGIOGRAPHY CHEST WITH CONTRAST TECHNIQUE: Multidetector CT imaging of the chest was performed using the standard protocol during bolus administration of intravenous contrast. Multiplanar CT image reconstructions and MIPs were obtained to evaluate the vascular anatomy.  RADIATION DOSE REDUCTION: This exam was performed according to the departmental dose-optimization program which includes automated exposure control, adjustment of the mA and/or kV according to patient size and/or use of iterative reconstruction technique. CONTRAST:  70m OMNIPAQUE IOHEXOL 350 MG/ML SOLN COMPARISON:  September 18, 2021. FINDINGS: Cardiovascular: Satisfactory opacification of the pulmonary arteries to the segmental level. No evidence of pulmonary embolism. Normal heart size. No pericardial effusion. Status post coronary bypass graft. Atherosclerosis of thoracic aorta is noted without aneurysm or dissection. Mediastinum/Nodes: No enlarged mediastinal, hilar, or axillary lymph nodes. Thyroid gland, trachea, and esophagus demonstrate no significant findings. Lungs/Pleura: No pneumothorax or pleural effusion is noted. 6.8 x 5.0 cm cavitating lesion is noted in right upper lobe which is significantly increased in size compared to prior exam and most consistent with cavitating pneumonia or pulmonary abscess. Mild emphysematous disease is noted. Upper Abdomen: Hepatic cirrhosis with mild to moderate ascites. Musculoskeletal: No chest wall abnormality. No acute or significant osseous findings. Review of the MIP images confirms the above findings. IMPRESSION: No definite evidence of pulmonary embolus. 6.8 x 5.0 cm cavitating lesion is noted in right upper lobe which is significantly increased in size compared to prior exam and most consistent with cavitating pneumonia or pulmonary abscess. Hepatic cirrhosis with mild to moderate ascites. Aortic Atherosclerosis (ICD10-I70.0) and Emphysema (ICD10-J43.9). Electronically Signed   By: JMarijo ConceptionM.D.   On: 09/30/2021 18:28    Cardiac Studies     Echo:  09/19/2021    1. Left ventricular ejection fraction, by estimation, is 45 to 50%. The  left ventricle has mildly decreased function. The left ventricle  demonstrates global hypokinesis. The left  ventricular internal cavity size  was mildly dilated. Left ventricular  diastolic parameters are consistent with Grade I diastolic dysfunction  (impaired relaxation).   2. Right ventricular systolic function is normal. The right ventricular  size is normal. There is mildly elevated pulmonary artery systolic  pressure.   3. Left atrial size was mildly dilated.   4. Right atrial size was mildly dilated.   5. The mitral valve is normal in structure. Trivial mitral valve  regurgitation. No evidence of mitral stenosis.   6. Tricuspid valve regurgitation is moderate.   7. The aortic valve is tricuspid. Aortic valve regurgitation is trivial.  No aortic stenosis is present.   8. The inferior vena cava is normal in size with greater than 50%  respiratory variability, suggesting right atrial pressure of 3 mmHg.     Patient Profile     Joseph WILLDENis a 81y.o. male with a hx of CAD status  post PCI and CABG x3 ain 1998 who has an ischemic cardiomyopathy admitted with tachycardia and lower extremity swelling.    Assessment & Plan    Acute on chronic combined systolic and diastolic heart failure: Breathing better today with IV diuresis with Lasix 80 mg twice daily.  He continues to have 3+ lower extremity edema.  We will continue with IV Lasix at present dose.  Most recent EF by echo September 19, 2021 was 45 to 50% with grade 1 diastolic dysfunction.  BNP 125. CAD, status post CABG revascularization surgery:.  Patient underwent initial primary PTCA in the setting of an acute anterior wall myocardial infarction by me in November 1998.  Due to concomitant high-grade distal left main stenosis with ostial encroachment to the circumflex vessel he underwent successful CABG revascularization with LIMA to LAD, vein to diagonal, and vein to third marginal.  He is now 25 years since CABG revascularization.  He has not had any recent anginal symptomatology. Tachycardia on presentation: This was sinus tachycardia  in the setting of his volume overload. Hyponatremia: Sodium today 129, improved from 125, most likely dilusional contributed by volume overload Low serum albumin, contributing to his edema Hyperlipidemia: No recent LDL.  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

## 2021-10-01 NOTE — Hospital Course (Addendum)
Mr. Saraceno was admitted to the hospital with the working diagnosis of decompensated heart failure.   81 yo male with the past medical history of coronary artery disease, heart failure, non alcoholic liver cirrhosis, hypertension and CKD who presented with lower extremity edema. Recent hospitalization for heart failure 09/06 to 09/09 treated for heart failure and cavitary pneumonia. On his initial physical examination his blood pressure was 114/55, HR 93, RR 22 and 02 saturation 97%, lungs with no wheezing or rales, heart with S1 and S2 present and rhythmic, abdomen not distended and positive lower extremity edema.   Na 129, K 4,2 CL 90 bicarbonate 28 glucose 119 bun 17 cr 1,18  BNP 125  Wbc 14,2 hgb 12,1 plt 214   Ct chest with bilateral ground glass opacities, mild paraseptal emphysema and bronchiectasis. Positive right upper lobe 6.8 x 5.0 cm increased size cavitation pneumonia / pulmonary abscess.   EKG 125 bpm, left axis deviation, right bundle branch block, sinus with no significant ST segment or T wave changes, noisy baseline.   Patient has been placed on furosemide for diuresis.  Pulmonary consulted for worsening cavitary lesion despite antibiotic therapy.   09/21 patient is diuresing well but continue volume overloaded. Plan for diagnostic bronchoscopy tomorrow.  09/22 patient with tachycardia in the 120 to 130 bpm, cancelled bronchoscopy for today and will be rescheduled for Monday.  09/23 heart rate has improved with metoprolol.

## 2021-10-01 NOTE — Assessment & Plan Note (Addendum)
Echocardiogram with reduced LV systolic function with EF 45 to 50%, global hypokinesis. Preserved RV systolic function, with moderate TR.    Urine output is 0,122 mmHg Systolic blood pressure is 120 mmHg.   Continue midodrine for blood pressure support. Patient has Unna boots. Diuresis with furosemide, empagliflozin and spironolactone.   Sinus tachycardia, early this am patient has developed tachycardia 120 to 130, looks sinus on telemetry.  Possible intravascular volume down, he has protein malnutrition that can be contributing to his peripheral edema.   Plan to decrease furosemide to daily from BID Add low dose metoprolol for rate control for now, it will benefit his heart failure.  Check 12 lead EKG this am.   Right upper extremity with age indeterminate superficial vein thrombosis involving the right cephalic vein.

## 2021-10-01 NOTE — Progress Notes (Signed)
Pharmacy Antibiotic Note  Joseph Bryan is a 81 y.o. male for which pharmacy has been consulted for Unasyn (ampicillin-sulbactam) and vancomycin dosing for  cavitary pneumonia .  Patient with a history of PCI/CABG, RBBB, chronic combined CHF/ischemic cardiomyopathy, nonalcoholic cirrhosis, HTN, HLD, CKD, OSA, GERD. Patient presenting with bilateral lower extremity edema.  Recent admission (9/6-9/9) decompensated HF. Discharged on augmentin x 4 weeks for cavitary pna.  CTA chest w/ increased size of cavitary lesion since last imaging.  SCr 1.13 WBC 12.8; afebrile; HR 128; RR 19 Unasyn 3g x 1 dose given in ED on 9/18 @ 21:15  Plan: Start Unasyn 3g IV q6h Continue Vancomycin 1500 mg IV q24hr (eAUC 499.7) unless change in renal function Trend WBC, Fever, Renal function F/u cultures, clinical course, WBC, fever De-escalate when able Levels at steady state    Temp (24hrs), Avg:97.9 F (36.6 C), Min:97.6 F (36.4 C), Max:98.3 F (36.8 C)  Recent Labs  Lab 09/30/21 1510 10/01/21 0158  WBC 14.2* 12.8*  CREATININE 1.18 1.13     Estimated Creatinine Clearance: 69.3 mL/min (by C-G formula based on SCr of 1.13 mg/dL).    Allergies  Allergen Reactions   Other Rash and Other (See Comments)    CHG soap and wipes patient states today(06/21/19), he does not recall this allergy. He completed the pre-op chg baths without any problems.    Antimicrobials this admission: unasyn 9/18 >>  vancomycin 9/18 >>  Microbiology results: Pending  Thank you for allowing pharmacy to be a part of this patient's care.  Luisa Hart, PharmD, BCPS Clinical Pharmacist 10/01/2021 7:45 AM   Please refer to AMION for pharmacy phone number

## 2021-10-02 ENCOUNTER — Ambulatory Visit: Payer: Medicare Other | Admitting: Nurse Practitioner

## 2021-10-02 ENCOUNTER — Inpatient Hospital Stay (HOSPITAL_BASED_OUTPATIENT_CLINIC_OR_DEPARTMENT_OTHER): Payer: Medicare Other

## 2021-10-02 ENCOUNTER — Other Ambulatory Visit (HOSPITAL_COMMUNITY): Payer: Self-pay

## 2021-10-02 ENCOUNTER — Telehealth (HOSPITAL_COMMUNITY): Payer: Self-pay

## 2021-10-02 DIAGNOSIS — E871 Hypo-osmolality and hyponatremia: Secondary | ICD-10-CM | POA: Diagnosis not present

## 2021-10-02 DIAGNOSIS — R609 Edema, unspecified: Principal | ICD-10-CM

## 2021-10-02 DIAGNOSIS — I2510 Atherosclerotic heart disease of native coronary artery without angina pectoris: Secondary | ICD-10-CM | POA: Diagnosis not present

## 2021-10-02 DIAGNOSIS — E669 Obesity, unspecified: Secondary | ICD-10-CM | POA: Diagnosis present

## 2021-10-02 DIAGNOSIS — E43 Unspecified severe protein-calorie malnutrition: Secondary | ICD-10-CM | POA: Insufficient documentation

## 2021-10-02 DIAGNOSIS — J189 Pneumonia, unspecified organism: Secondary | ICD-10-CM | POA: Diagnosis not present

## 2021-10-02 DIAGNOSIS — I5023 Acute on chronic systolic (congestive) heart failure: Secondary | ICD-10-CM | POA: Diagnosis not present

## 2021-10-02 LAB — LIPID PANEL
Cholesterol: 104 mg/dL (ref 0–200)
HDL: <10 mg/dL — ABNORMAL LOW (ref >40)
Triglycerides: 40 mg/dL (ref <150)
VLDL: 8 mg/dL (ref 0–40)

## 2021-10-02 LAB — BASIC METABOLIC PANEL
Anion gap: 13 (ref 5–15)
BUN: 16 mg/dL (ref 8–23)
CO2: 30 mmol/L (ref 22–32)
Calcium: 8.6 mg/dL — ABNORMAL LOW (ref 8.9–10.3)
Chloride: 88 mmol/L — ABNORMAL LOW (ref 98–111)
Creatinine, Ser: 1.19 mg/dL (ref 0.61–1.24)
GFR, Estimated: >60 mL/min (ref >60)
Glucose, Bld: 101 mg/dL — ABNORMAL HIGH (ref 70–99)
Potassium: 4.3 mmol/L (ref 3.5–5.1)
Sodium: 131 mmol/L — ABNORMAL LOW (ref 135–145)

## 2021-10-02 LAB — MAGNESIUM: Magnesium: 1.6 mg/dL — ABNORMAL LOW (ref 1.7–2.4)

## 2021-10-02 MED ORDER — ACETAMINOPHEN 325 MG PO TABS
650.0000 mg | ORAL_TABLET | Freq: Four times a day (QID) | ORAL | Status: DC | PRN
  Administered 2021-10-03 – 2021-10-06 (×3): 650 mg via ORAL
  Filled 2021-10-02 (×3): qty 2

## 2021-10-02 MED ORDER — ENSURE ENLIVE PO LIQD
237.0000 mL | Freq: Two times a day (BID) | ORAL | Status: DC
  Administered 2021-10-02 – 2021-10-09 (×10): 237 mL via ORAL

## 2021-10-02 MED ORDER — MAGNESIUM SULFATE 2 GM/50ML IV SOLN
2.0000 g | Freq: Once | INTRAVENOUS | Status: AC
  Administered 2021-10-02: 2 g via INTRAVENOUS
  Filled 2021-10-02: qty 50

## 2021-10-02 MED ORDER — ASPIRIN 81 MG PO TBEC
81.0000 mg | DELAYED_RELEASE_TABLET | Freq: Every day | ORAL | Status: DC
  Administered 2021-10-02 – 2021-10-10 (×8): 81 mg via ORAL
  Filled 2021-10-02 (×9): qty 1

## 2021-10-02 MED ORDER — SPIRONOLACTONE 25 MG PO TABS
25.0000 mg | ORAL_TABLET | Freq: Every day | ORAL | Status: DC
  Administered 2021-10-02 – 2021-10-10 (×8): 25 mg via ORAL
  Filled 2021-10-02 (×9): qty 1

## 2021-10-02 NOTE — Progress Notes (Signed)
Orthopedic Tech Progress Note Patient Details:  Joseph Bryan Jul 27, 1940 [id number]  Ortho Devices Type of Ortho Device: Haematologist Ortho Device/Splint Location: Bi LE Ortho Device/Splint Interventions: Application   Post Interventions Patient Tolerated: Well  Joseph Bryan [name] 10/02/2021, 1:13 PM

## 2021-10-02 NOTE — Progress Notes (Signed)
Initial Nutrition Assessment  DOCUMENTATION CODES:   Severe malnutrition in context of chronic illness  INTERVENTION:  -Provide Ensure Plus HP BID(350kcal, 20g protein) -Check B12, restart supplement if low -Provide meal preferences to optimize po intake. Reviewed menu with patient and discussed alternatives  NUTRITION DIAGNOSIS:   Severe Malnutrition related to chronic illness, early satiety as evidenced by energy intake < or equal to 75% for > or equal to 1 month, moderate fat depletion, severe muscle depletion, edema, other (comment) (significant 20.3% unintended weight loss in 1 year).  GOAL:  Patient will meet greater than or equal to 90% of their needs  MONITOR:  Supplement acceptance, PO intake  REASON FOR ASSESSMENT:  Consult Assessment of nutrition requirement/status  ASSESSMENT:  Pt is an 81yo M with PMH of CHF, CAD, HLD, HTN, CKD and non alcoholic liver cirrhosis who presents with decompensated heart failure.  Visited pt at bedside with wife present. Pt reports a decline in appetite over the last several months due to early satiety. Diet recall: breakfast-eggs and bacon, dinner-salisbury steak, veggie and starch. On occasion pt will snack on unsalted nuts, oatmeal raisin cookies, or ice cream. Wife reports in the last several days pt was only able to eat 1/2 grilled cheese, tomatoes and mayo and cucumbers. Pt is meeting <75% estimated nutrient needs for > 1 month. During admission, eating only 25% of meals. Pt reports a significant 20.3% unintended weight loss in the last 1 year. Shows physical signs of muscle and fat depletion and reports weakness in LE. Pt unable to walk or get out of chair PTA. Reports taking supplements at home: vitamin B12, vitamin C, vitamin D and bee pollen. Noted bright red, smooth tongue, recommend checking B12 lab value. Noted stage 2 PI on admission. Pt has not tried ONS in the past but is agreeable to Ensure Plus HP (strawberry) daily.  Labs  reviewed: BNP:125.8, Mg:1.6, Cr:1.19, BG:101, Na:131  Medications reviewed and include: lasix, spironolactone   NUTRITION - FOCUSED PHYSICAL EXAM:  Flowsheet Row Most Recent Value  Orbital Region Severe depletion  Thoracic and Lumbar Region No depletion  Buccal Region Moderate depletion  Temple Region Severe depletion  Clavicle Bone Region Moderate depletion  Clavicle and Acromion Bone Region Severe depletion  Scapular Bone Region Unable to assess  Dorsal Hand Moderate depletion  Patellar Region No depletion  Anterior Thigh Region No depletion  Posterior Calf Region Unable to assess  Edema (RD Assessment) Severe  Hair Reviewed  Eyes Reviewed  Mouth Other (Comment)  [red, smooth tongue]  Skin Other (Comment)  [scaly lower extremity, reported recent cellulitis]  Nails Reviewed       Diet Order:   Diet Order             Diet Heart Room service appropriate? Yes; Fluid consistency: Thin; Fluid restriction: 1200 mL Fluid  Diet effective now                   EDUCATION NEEDS:  Education needs have been addressed (discussed importance of protein and calories due to deconditioning/malnutrition.)  Skin:  Skin Assessment: Skin Integrity Issues: Skin Integrity Issues:: Stage II Stage II: sacrum  Last BM:  9/18  Height:  Ht Readings from Last 1 Encounters:  10/01/21 '6\' 1"'$  (1.854 m)    Weight:  Wt Readings from Last 1 Encounters:  10/02/21 115.6 kg   Ideal Body Weight:   83.6kg  BMI:  Body mass index is 33.62 kg/m.  Estimated Nutritional Needs:   Kcal:  1735-2080kcal  Protein:  100-125g  Fluid:  1222m (12043mfluid restriction)  KaCandise BowensMS, RD, LDN, CNSC See AMiON for contact information

## 2021-10-02 NOTE — Progress Notes (Signed)
Upper extremity venous has been completed.   Preliminary results in CV Proc.   Dilan Fullenwider Makya Phillis 10/02/2021 3:10 PM

## 2021-10-02 NOTE — Progress Notes (Addendum)
Rounding Note    Patient Name: Joseph Bryan Date of Encounter: 10/02/2021  [place] Cardiologist: Shelva Majestic, MD   Subjective   Feeling somewhat better this morning. Breathing is improving  Inpatient Medications    Scheduled Meds:  furosemide  80 mg Intravenous BID   midodrine  10 mg Oral TID WC   Continuous Infusions:  ampicillin-sulbactam (UNASYN) IV 3 g (10/02/21 0819)   magnesium sulfate bolus IVPB     vancomycin 1,500 mg (10/02/21 0008)   PRN Meds:    Vital Signs    Vitals:   10/01/21 1227 10/01/21 2116 10/02/21 0008 10/02/21 0401  BP: (!) 116/58 129/60 (!) 121/56 118/60  Pulse: 91 93 99 99  Resp: '15 16 17 20  '$ Temp: (!) 97.4 F (36.3 C) 97.8 F (36.6 C) 97.9 F (36.6 C) 97.9 F (36.6 C)  TempSrc: Oral Oral Oral Oral  SpO2:  95% 95% 93%  Weight: 115.7 kg  115.6 kg   Height: '6\' 1"'$  (1.854 m)       Intake/Output Summary (Last 24 hours) at 10/02/2021 1014 Last data filed at 10/02/2021 0404 Gross per 24 hour  Intake 1107.91 ml  Output 2100 ml  Net -992.09 ml      10/02/2021   12:08 AM 10/01/2021   12:27 PM 09/21/2021    5:00 AM  Last 3 Weights  Weight (lbs) 254 lb 13.6 oz 255 lb 1.2 oz 262 lb 5.6 oz  Weight (kg) 115.6 kg 115.7 kg 119 kg      Telemetry    Sinus Rhythm, 80-90s, PVCs - Personally Reviewed  ECG    No new tracing  Physical Exam   GEN: Pleasant older male, sitting up in the bed. No acute distress.   Neck: No JVD Cardiac: RRR, no murmurs, rubs, or gallops.  Respiratory: Crackles in bases GI: Soft, nontender, non-distended  MS: 3+ LE edema bilaterally, right forearm edema; No deformity. Neuro:  Nonfocal  Psych: Normal affect   Labs    High Sensitivity Troponin:  No results for input(s): "TROPONINIHS" in the last 720 hours.   Chemistry Recent Labs  Lab 09/30/21 1510 10/01/21 0158 10/01/21 1458 10/02/21 0452  NA 125* 129* 128* 131*  K 4.2 4.6 3.5 4.3  CL 90* 92* 90* 88*  CO2 '28 26 28 30  '$ GLUCOSE 119* 96  159* 101*  BUN '17 17 17 16  '$ CREATININE 1.18 1.13 1.20 1.19  CALCIUM 8.1* 8.4* 8.1* 8.6*  MG 1.9  --   --  1.6*  PROT 7.3  --  7.1  --   ALBUMIN 1.8*  --  1.6*  --   AST 59*  --  54*  --   ALT 25  --  21  --   ALKPHOS 163*  --  152*  --   BILITOT 2.5*  --  2.0*  --   GFRNONAA >60 >60 >60 >60  ANIONGAP '7 11 10 13    '$ Lipids  Recent Labs  Lab 10/02/21 0452  CHOL 104  TRIG 40  HDL <10*  LDLCALC NOT CALCULATED  CHOLHDL NOT CALCULATED    Hematology Recent Labs  Lab 09/30/21 1510 10/01/21 0158 10/01/21 1633  WBC 14.2* 12.8* 11.5*  RBC 3.65* 3.87* 3.49*  HGB 12.1* 12.9* 11.6*  HCT 35.9* 38.7* 34.1*  MCV 98.4 100.0 97.7  MCH 33.2 33.3 33.2  MCHC 33.7 33.3 34.0  RDW 16.5* 16.5* 16.4*  PLT 214 211 207   Thyroid No results for input(s): "  TSH", "FREET4" in the last 168 hours.  BNP Recent Labs  Lab 09/30/21 1510  BNP 125.8*    DDimer No results for input(s): "DDIMER" in the last 168 hours.   Radiology    CT Angio Chest PE W and/or Wo Contrast  Result Date: 09/30/2021 CLINICAL DATA:  Lower extremity edema, weakness. EXAM: CT ANGIOGRAPHY CHEST WITH CONTRAST TECHNIQUE: Multidetector CT imaging of the chest was performed using the standard protocol during bolus administration of intravenous contrast. Multiplanar CT image reconstructions and MIPs were obtained to evaluate the vascular anatomy. RADIATION DOSE REDUCTION: This exam was performed according to the departmental dose-optimization program which includes automated exposure control, adjustment of the mA and/or kV according to patient size and/or use of iterative reconstruction technique. CONTRAST:  40m OMNIPAQUE IOHEXOL 350 MG/ML SOLN COMPARISON:  September 18, 2021. FINDINGS: Cardiovascular: Satisfactory opacification of the pulmonary arteries to the segmental level. No evidence of pulmonary embolism. Normal heart size. No pericardial effusion. Status post coronary bypass graft. Atherosclerosis of thoracic aorta is noted  without aneurysm or dissection. Mediastinum/Nodes: No enlarged mediastinal, hilar, or axillary lymph nodes. Thyroid gland, trachea, and esophagus demonstrate no significant findings. Lungs/Pleura: No pneumothorax or pleural effusion is noted. 6.8 x 5.0 cm cavitating lesion is noted in right upper lobe which is significantly increased in size compared to prior exam and most consistent with cavitating pneumonia or pulmonary abscess. Mild emphysematous disease is noted. Upper Abdomen: Hepatic cirrhosis with mild to moderate ascites. Musculoskeletal: No chest wall abnormality. No acute or significant osseous findings. Review of the MIP images confirms the above findings. IMPRESSION: No definite evidence of pulmonary embolus. 6.8 x 5.0 cm cavitating lesion is noted in right upper lobe which is significantly increased in size compared to prior exam and most consistent with cavitating pneumonia or pulmonary abscess. Hepatic cirrhosis with mild to moderate ascites. Aortic Atherosclerosis (ICD10-I70.0) and Emphysema (ICD10-J43.9). Electronically Signed   By: JMarijo ConceptionM.D.   On: 09/30/2021 18:28    Cardiac Studies     Echo:  09/19/2021    1. Left ventricular ejection fraction, by estimation, is 45 to 50%. The  left ventricle has mildly decreased function. The left ventricle  demonstrates global hypokinesis. The left ventricular internal cavity size  was mildly dilated. Left ventricular  diastolic parameters are consistent with Grade I diastolic dysfunction  (impaired relaxation).   2. Right ventricular systolic function is normal. The right ventricular  size is normal. There is mildly elevated pulmonary artery systolic  pressure.   3. Left atrial size was mildly dilated.   4. Right atrial size was mildly dilated.   5. The mitral valve is normal in structure. Trivial mitral valve  regurgitation. No evidence of mitral stenosis.   6. Tricuspid valve regurgitation is moderate.   7. The aortic valve is  tricuspid. Aortic valve regurgitation is trivial.  No aortic stenosis is present.   8. The inferior vena cava is normal in size with greater than 50%  respiratory variability, suggesting right atrial pressure of 3 mmHg.       Patient Profile     81y.o. male with a hx of CAD status post PCI and CABG x3 in 1998 who has an ischemic cardiomyopathy admitted with tachycardia and lower extremity swelling.    Assessment & Plan    HFmrEF -- LVEF noted at 45-50%, G1DD on 9/7. BNP 125, albumin 1.6. Net - 1.8L Remains significantly volume overloaded on exam with 3+ LE edema.  Unna boots?  --  Will continue IV lasix '80mg'$  BID. Right forearm with significant edema this morning, concerning for IV infiltration.  -- add spiro '25mg'$  daily  CAD s/p CABG x3 '98 -- No anginal symptoms -- resume '81mg'$  ASA  HLD -- no longer on statin therapy 2/2 nonalcoholic cirrhosis  Hyponatermia  -- Na+ 125>>129>>131 -- improving with diuresis  Hypomag -- Mag + 1.6, suppl   Cavitary PNA -- CT scan with increasing right upper lobe PNA -- per primary  For questions or updates, please contact [place] Please consult [email or web address] for contact info under        Signed, Reino Bellis, NP  10/02/2021, 10:14 AM     Patient seen and examined. Agree with assessment and plan. I/0 - 2592 since admission.  Patient feels improved today.  Breathing better.  He is tense edema has improved although still has 3+ lower extremity edema which is now more soft.  At present we will continue with IV diuresis with furosemide 80 mg twice a day.  We will also add spironolactone 25 mg daily.  He does not have any recurrent anginal symptomatology and is status post CABG surgery in 1998.  He has a history of nonalcoholic cirrhosis which is why he is no longer on statin therapy.  Magnesium is low at 1.6, supplemented.  Sodium level is now increased to 131 with  diuresis.  On vancomycin and Unasyn for right upper lobe cavity  pneumonia.  Pulmonary consultation has been obtained and will see today.  Troy Sine, MD, [facility] 10/02/2021 12:24 PM

## 2021-10-02 NOTE — Assessment & Plan Note (Signed)
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

## 2021-10-02 NOTE — Telephone Encounter (Signed)
Pharmacy Patient Advocate Encounter  Insurance verification completed.    The patient is insured through [employer]   The patient is currently admitted and ran test claims for the following: [place] and [place].  Copays and coinsurance results were relayed to Inpatient clinical team.

## 2021-10-02 NOTE — TOC Progression Note (Signed)
Transition of Care [facility]) - Progression Note    Patient Details  Name: Joseph Bryan MRN: [id number] Date of Birth: 11-15-1940  [facility] of [facility]) CM/SW Contact  Zenon Mayo, RN Phone Number: 10/02/2021, 4:57 PM  Clinical Narrative:    From home, with wife, presents with CHF, PNA, conts on iv abx, iv lasix, EF 45-50.  TOC following.         Expected Discharge Plan and Services                                                 Social Determinants of Health (SDOH) Interventions    Readmission Risk Interventions     No data to display

## 2021-10-02 NOTE — Progress Notes (Addendum)
Progress Note   Patient: Joseph Bryan EVO:[id number] DOB: 1940-09-29 DOA: 09/30/2021     2 DOS: the patient was seen and examined on 10/02/2021   Brief hospital course: Mr. Gee was admitted to the hospital with the working diagnosis of decompensated heart failure.   81 yo male with the past medical history of coronary artery disease, heart failure, non alcoholic liver cirrhosis, hypertension and CKD who presented with lower extremity edema. Recent hospitalization for heart failure 09/06 to 09/09 treated for heart failure and cavitary pneumonia. On his initial physical examination his blood pressure was 114/55, HR 93, RR 22 and 02 saturation 97%, lungs with no wheezing or rales, heart with S1 and S2 present and rhythmic, abdomen not distended and positive lower extremity edema.   Na 129, K 4,2 CL 90 bicarbonate 28 glucose 119 bun 17 cr 1,18  BNP 125  Wbc 14,2 hgb 12,1 plt 214   Ct chest with bilateral ground glass opacities, mild paraseptal emphysema and bronchiectasis. Positive right upper lobe 6.8 x 5.0 cm increased size cavitation pneumonia / pulmonary abscess.   EKG 125 bpm, left axis deviation, right bundle branch block, sinus with no significant ST segment or T wave changes, noisy baseline.   Patient has been placed on furosemide for diuresis.  Pulmonary consulted for worsening cavitary lesion despite antibiotic therapy.   Assessment and Plan: * Acute on chronic systolic CHF (congestive heart failure) (HCC) Echocardiogram with reduced LV systolic function with EF 45 to 50%, global hypokinesis. Preserved RV systolic function, with moderate TR.   Continue with edema at his lower extremities.  Urine output is 2,993 mmHg Systolic blood pressure is 118 to 126 mmHg.   Continue diuresis with IV furosemide and spironolactone.  Continue midodrine for blood pressure support. Agree with Unna boots. Check [place] right upper extremity.    Cavitary pneumonia Patient with right upper lobe  cavitary pneumonia, Lesion size has increased despite antibiotic therapy for the last 14 days. Wbc is 11.5 Patient has been afebrile and cultures with no growth. Oxymetry is 93% on room air.    Plan to continue with Vancomycin and Unasyn.  Considering worsening lesion, may need collect samples for microbiology/ pathology.  Will consult pulmonary patient may need bronchoscopy.   Hyponatremia Hypervolemic hyponatremia, hypomagnesemia   Renal function with serum cr at 1,19 with K at 4,3 and serum bicarbonate at 30. Mg 1,6   Plan to continue diuresis with furosemide. Add 2 g Mag sulfate IV Follow up renal function in am, avoid hypotension and nephrotoxic medications.   CAD- s/p CABG in 1998 - patent grafts on cath in 2010 No chest pain  HTN (hypertension) Continue midodrine for blood pressure support.   Class 1 obesity Calculated BMI is 33,6  Patient with anasarca suspect calorie protein malnutrition, will follow up with nutrition recommendations.         Subjective: Patient continue to have edema lower extremities and decreased mobility   Physical Exam: Vitals:   10/01/21 1227 10/01/21 2116 10/02/21 0008 10/02/21 0401  BP: (!) 116/58 129/60 (!) 121/56 118/60  Pulse: 91 93 99 99  Resp: '15 16 17 20  '$ Temp: (!) 97.4 F (36.3 C) 97.8 F (36.6 C) 97.9 F (36.6 C) 97.9 F (36.6 C)  TempSrc: Oral Oral Oral Oral  SpO2:  95% 95% 93%  Weight: 115.7 kg  115.6 kg   Height: '6\' 1"'$  (1.854 m)      Neurology awake and alert ENT with mild pallor Cardiovascular with S1  and S2 present and rhythmic with no gallops or murmurs Respiratory with no rales or wheezing Abdomen with no distention  Positive lower extremity edema up to the thighs +++ Right upper extremity edema.  Data Reviewed:    Family Communication: I spoke with patient's wife at the bedside, we talked in detail about patient's condition, plan of care and prognosis and all questions were  addressed.   Disposition: Status is: Inpatient Remains inpatient appropriate because: heart failure and worsening pulmonary cavitary lesion   Planned Discharge Destination: Home    Author: Tawni Millers, MD 10/02/2021 12:02 PM  For on call review [email or web address].

## 2021-10-02 NOTE — Consult Note (Signed)
NAME:  Joseph Bryan, MRN:  [id number], DOB:  Nov 21, 1940, LOS: 2 ADMISSION DATE:  09/30/2021, CONSULTATION DATE: 9/20 REFERRING MD: Dr. 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  He was recently admitted from 9/6-9/9 and treated for decompensated heart failure with IV diuresis.  There was some concern during that admission for pulmonary embolism and CT angiogram of the chest was done revealing right upper lobe cavitary pneumonia.  The patient had no infectious complaints and no clinical signs of pneumonia.  He was evaluated by pulmonary at that time and was recommended for 1 month of Augmentin and pulmonary follow-up at that time for additional imaging.  Unfortunately the patient again presents to [facility] emergency department on 9/18 with complaints of lower extremity edema and generalized weakness.  The patient and his wife report good compliance with medical regimen including diuretics and antibiotics, however, lower extremity edema did worsen.  He was admitted to the hospitalist service for CHF treatment and cardiology was consulted.  CTA was repeated and was again negative for PE, however, showed an increase in size of the right upper lobe cavitary lesion despite antibiotics.  PCCM consulted for further recommendations.  The patient again denies infectious symptoms such as fever, chills, and productive cough.  Pertinent  Medical History   has a past medical history of Cancer ([place]), CHF exacerbation ([place]) (09/30/2021), Chronic kidney disease, Coronary artery disease, Glaucoma, History of kidney stones, Hyperlipidemia, Hypertension, Neuromuscular disorder ([place]), Neuropathy, Pneumonia, Renal insufficiency, and Sleep apnea with use of continuous positive airway  pressure (CPAP).   Significant Hospital Events: Including procedures, antibiotic start and stop dates in addition to other pertinent events   9/18 admit for CHF exacerbation. Cavitary lesion worse on CT.   Interim History / Subjective:    Objective   Blood pressure 118/60, pulse 99, temperature 97.9 F (36.6 C), temperature source Oral, resp. rate 20, height _0  (1.854 m), weight 115.6 kg, SpO2 93 %.        Intake/Output Summary (Last 24 hours) at 10/02/2021 1247 Last data filed at 10/02/2021 1124 Gross per 24 hour  Intake 1107.91 ml  Output 2800 ml  Net -1692.09 ml   Filed Weights   10/01/21 1227 10/02/21 0008  Weight: 115.7 kg 115.6 kg    Examination: General: Elderly male with normal body habitus in no acute distress HENT: Normocephalic, atraumatic Lungs: Bibasilar crackles Cardiovascular: PRRR, no MRG, rate controlled on telemetry Abdomen: Soft, nontender, nondistended Extremities: +3 lower extremity pitting edema bilaterally Neuro: Alert, oriented, non focal  Resolved Hospital Problem list     Assessment & Plan:   Right upper lobe cavitary lesion: size increasing despite 14 days augmentin.  -Agree with empiric antibiotics as recommended by ID (Unasyn, Vanco) -Sputum culture pending, but doubtful he will be able to provide adequate specimen. -Dr. Valeta Harms will arrange bronchoscopy for BAL, AFB   Acute on chronic HFrEF  Atrial fibrillation -Management per cardiology  Best Practice (right click and "Reselect all SmartList Selections" daily)   Diet/type: Regular consistency (see orders) DVT prophylaxis: not indicated GI prophylaxis: N/A Lines: N/A Foley:  N/A Code Status:  DNR Last date of multidisciplinary goals of care discussion [  ] Family updated bedside 9/20  Labs   CBC: Recent Labs  Lab 09/30/21 1510 10/01/21 0158 10/01/21 1633  WBC 14.2*  12.8* 11.5*  NEUTROABS 12.0*  --  8.7*  HGB 12.1* 12.9* 11.6*  HCT 35.9* 38.7* 34.1*  MCV 98.4 100.0  97.7  PLT 214 211 073    Basic Metabolic Panel: Recent Labs  Lab 09/30/21 1510 10/01/21 0158 10/01/21 1458 10/02/21 0452  NA 125* 129* 128* 131*  K 4.2 4.6 3.5 4.3  CL 90* 92* 90* 88*  CO2 _0 GLUCOSE 119* 96 159* 101*  BUN _1 CREATININE 1.18 1.13 1.20 1.19  CALCIUM 8.1* 8.4* 8.1* 8.6*  MG 1.9  --   --  1.6*   GFR: Estimated Creatinine Clearance: 64.9 mL/min (by C-G formula based on SCr of 1.19 mg/dL). Recent Labs  Lab 09/30/21 1510 10/01/21 0158 10/01/21 1633  WBC 14.2* 12.8* 11.5*    Liver Function Tests: Recent Labs  Lab 09/30/21 1510 10/01/21 1458  AST 59* 54*  ALT 25 21  ALKPHOS 163* 152*  BILITOT 2.5* 2.0*  PROT 7.3 7.1  ALBUMIN 1.8* 1.6*   No results for input(s): "LIPASE", "AMYLASE" in the last 168 hours. No results for input(s): "AMMONIA" in the last 168 hours.  ABG No results found for: "PHART", "PCO2ART", "PO2ART", "HCO3", "TCO2", "ACIDBASEDEF", "O2SAT"   Coagulation Profile: No results for input(s): "INR", "PROTIME" in the last 168 hours.  Cardiac Enzymes: No results for input(s): "CKTOTAL", "CKMB", "CKMBINDEX", "TROPONINI" in the last 168 hours.  HbA1C: No results found for: "HGBA1C"  CBG: No results for input(s): "GLUCAP" in the last 168 hours.  Review of Systems:   Bolds are positive  Constitutional: weight loss, gain, night sweats, Fevers, chills, fatigue .  HEENT: headaches, Sore throat, sneezing, nasal congestion, post nasal drip, Difficulty swallowing, Tooth/dental problems, visual complaints visual changes, ear ache CV:  chest pain, radiates:,Orthopnea, PND, swelling in lower extremities, dizziness, palpitations, syncope.  GI  heartburn, indigestion, abdominal pain, nausea, vomiting, diarrhea, change in bowel habits, loss of appetite, bloody stools.  Resp: cough, productive: , hemoptysis, dyspnea, chest pain, pleuritic.  Skin: rash or itching or icterus GU: dysuria, change in color of urine, urgency or  frequency. flank pain, hematuria  MS: joint pain or swelling. decreased range of motion  Psych: change in mood or affect. depression or anxiety.  Neuro: difficulty with speech, weakness, numbness, ataxia    Past Medical History:  He,  has a past medical history of Cancer ([place]), CHF exacerbation ([place]) (09/30/2021), Chronic kidney disease, Coronary artery disease, Glaucoma, History of kidney stones, Hyperlipidemia, Hypertension, Neuromuscular disorder ([place]), Neuropathy, Pneumonia, Renal insufficiency, and Sleep apnea with use of continuous positive airway pressure (CPAP).   Surgical History:   Past Surgical History:  Procedure Laterality Date   APPENDECTOMY  2014   CHOLECYSTECTOMY N/A 06/21/2019   Procedure: LAPAROSCOPIC CHOLECYSTECTOMY WITH INTRAOPERATIVE CHOLANGIOGRAM;  Surgeon: Armandina Gemma, MD;  Location: [facility];  Service: General;  Laterality: N/A;   COLON RESECTION N/A 08/27/2012   Procedure: Diagnostic laparoscopy; Ileocecectomy;  Surgeon: Madilyn Hook, DO;  Location: [facility];  Service: General;  Laterality: N/A;   COLONOSCOPY WITH PROPOFOL N/A 04/09/2015   Procedure: COLONOSCOPY WITH PROPOFOL;  Surgeon: Garlan Fair, MD;  Location: [facility];  Service: Endoscopy;  Laterality: N/A;   CORONARY ARTERY BYPASS GRAFT  1998   x5   CYSTOSCOPY/URETEROSCOPY/HOLMIUM LASER/STENT PLACEMENT Right 02/25/2018   Procedure: RIGHT URETEROSCOPY/HOLMIUM LASER/ STONE REMOVAL  STENT PLACEMENT;  Surgeon: Ardis Hughs, MD;  Location: [facility];  Service: Urology;  Laterality: Right;   ERCP N/A 06/22/2019  Procedure: ENDOSCOPIC RETROGRADE CHOLANGIOPANCREATOGRAPHY (ERCP);  Surgeon: Clarene Essex, MD;  Location: [facility];  Service: Endoscopy;  Laterality: N/A;   EYE SURGERY     cataract and glaucome   LUMBAR LAMINECTOMY/DECOMPRESSION MICRODISCECTOMY N/A 10/19/2015   Procedure: Lumbar three to four LAMINECTOMY/FORAMINOTOMY;  Surgeon: Erline Levine, MD;  Location: [facility] [facility];  Service: Neurosurgery;  Laterality:  N/A;   LUNG SURGERY  yrs ago   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 His smoking use included cigarettes. He has never used smokeless tobacco. He reports that he does not drink alcohol and does not use drugs.   Family History:  His family history includes Heart attack in his brother; Hypertension in his father and sister.   Allergies Allergies  Allergen Reactions   Other Rash and Other (See Comments)    CHG soap and wipes patient states today(06/21/19), he does not recall this allergy. He completed the pre-op chg baths without any problems.     Home Medications  Prior to Admission medications   Medication Sig Start Date End Date Taking? Authorizing Provider  amoxicillin-clavulanate (AUGMENTIN) 875-125 MG tablet Take 1 tablet by mouth 2 (two) times daily. 09/21/21 10/20/2021 Yes Aline August, MD  Ascorbic Acid (VITAMIN C) 1000 MG tablet Take 1,000 mg by mouth daily.   Yes [provider]  aspirin EC 81 MG tablet Take 1 tablet (81 mg total) by mouth daily. Swallow whole. 12/15/19  Yes Meng, Isaac Laud, PA  BEE POLLEN PO Take 3 capsules by mouth daily.   Yes [provider]  midodrine (PROAMATINE) 10 MG tablet Take 1 tablet (10 mg total) by mouth 3 (three) times daily with meals. 09/21/21 10/20/2021 Yes Aline August, MD  pantoprazole (PROTONIX) 40 MG tablet Take 40 mg by mouth daily.   Yes [provider]  spironolactone (ALDACTONE) 50 MG tablet Take 50 mg by mouth daily. 12/14/19  Yes [provider]  torsemide (DEMADEX) 20 MG tablet Take 2 tablets (40 mg total) by mouth daily as needed (fluid). Patient taking  differently: Take 20-40 mg by mouth See admin instructions. Take 2 tablets by mouth in the morning and 1 tablet in the afternoon 09/21/21  Yes Alekh, Kshitiz, MD  vitamin B-12 (CYANOCOBALAMIN) 100 MCG tablet Take 100 mcg by mouth daily.   Yes [provider]  ibuprofen (ADVIL) 200 MG tablet Take 200 mg by mouth as needed (pain).    [provider]          Georgann Housekeeper, AGACNP-BC [facility]  See Amion for personal pager PCCM on call pager [phone] until 7pm. Please call Elink 7p-7a. [phone]  10/02/2021 1:00 PM      ,

## 2021-10-03 DIAGNOSIS — R609 Edema, unspecified: Secondary | ICD-10-CM | POA: Diagnosis not present

## 2021-10-03 DIAGNOSIS — I2510 Atherosclerotic heart disease of native coronary artery without angina pectoris: Secondary | ICD-10-CM | POA: Diagnosis not present

## 2021-10-03 DIAGNOSIS — J189 Pneumonia, unspecified organism: Secondary | ICD-10-CM | POA: Diagnosis not present

## 2021-10-03 DIAGNOSIS — I5023 Acute on chronic systolic (congestive) heart failure: Secondary | ICD-10-CM | POA: Diagnosis not present

## 2021-10-03 DIAGNOSIS — E871 Hypo-osmolality and hyponatremia: Secondary | ICD-10-CM | POA: Diagnosis not present

## 2021-10-03 DIAGNOSIS — E43 Unspecified severe protein-calorie malnutrition: Secondary | ICD-10-CM

## 2021-10-03 LAB — BASIC METABOLIC PANEL
Anion gap: 10 (ref 5–15)
BUN: 17 mg/dL (ref 8–23)
CO2: 30 mmol/L (ref 22–32)
Calcium: 7.9 mg/dL — ABNORMAL LOW (ref 8.9–10.3)
Chloride: 90 mmol/L — ABNORMAL LOW (ref 98–111)
Creatinine, Ser: 1.11 mg/dL (ref 0.61–1.24)
GFR, Estimated: >60 mL/min (ref >60)
Glucose, Bld: 84 mg/dL (ref 70–99)
Potassium: 3 mmol/L — ABNORMAL LOW (ref 3.5–5.1)
Sodium: 130 mmol/L — ABNORMAL LOW (ref 135–145)

## 2021-10-03 LAB — MAGNESIUM: Magnesium: 1.7 mg/dL (ref 1.7–2.4)

## 2021-10-03 LAB — VITAMIN B12: Vitamin B-12: 2597 pg/mL — ABNORMAL HIGH (ref 180–914)

## 2021-10-03 MED ORDER — MAGNESIUM SULFATE 2 GM/50ML IV SOLN
2.0000 g | Freq: Once | INTRAVENOUS | Status: AC
  Administered 2021-10-03: 2 g via INTRAVENOUS
  Filled 2021-10-03: qty 50

## 2021-10-03 MED ORDER — OXYCODONE HCL 5 MG PO TABS
5.0000 mg | ORAL_TABLET | Freq: Once | ORAL | Status: AC | PRN
  Administered 2021-10-03: 5 mg via ORAL
  Filled 2021-10-03: qty 1

## 2021-10-03 MED ORDER — EMPAGLIFLOZIN 10 MG PO TABS
10.0000 mg | ORAL_TABLET | Freq: Every day | ORAL | Status: DC
  Administered 2021-10-03 – 2021-10-10 (×7): 10 mg via ORAL
  Filled 2021-10-03 (×8): qty 1

## 2021-10-03 MED ORDER — POTASSIUM CHLORIDE CRYS ER 20 MEQ PO TBCR
40.0000 meq | EXTENDED_RELEASE_TABLET | ORAL | Status: AC
  Administered 2021-10-03 (×2): 40 meq via ORAL
  Filled 2021-10-03 (×2): qty 2

## 2021-10-03 NOTE — Assessment & Plan Note (Signed)
Continue with nutritional supplements.  

## 2021-10-03 NOTE — Progress Notes (Addendum)
Rounding Note    Patient Name: Joseph Bryan Date of Encounter: 10/03/2021  [place] Cardiologist: Shelva Majestic, MD   Subjective   Sitting up in bed. Family at bedside. Feels his breathing has improved some.   Inpatient Medications    Scheduled Meds:  aspirin EC  81 mg Oral Daily   feeding supplement  237 mL Oral BID BM   furosemide  80 mg Intravenous BID   midodrine  10 mg Oral TID WC   potassium chloride  40 mEq Oral Q4H   spironolactone  25 mg Oral Daily   Continuous Infusions:  ampicillin-sulbactam (UNASYN) IV 3 g (10/03/21 0837)   magnesium sulfate bolus IVPB     vancomycin 1,500 mg (10/02/21 2346)   PRN Meds: acetaminophen   Vital Signs    Vitals:   10/03/21 0315 10/03/21 0427 10/03/21 0835 10/03/21 0953  BP:  (!) 126/59    Pulse: (!) 103 96    Resp: (!) '23 18 15   '$ Temp:  97.8 F (36.6 C) 97.9 F (36.6 C)   TempSrc:  Oral Oral   SpO2: 94% 95% 92% 93%  Weight:  118.8 kg    Height:        Intake/Output Summary (Last 24 hours) at 10/03/2021 1115 Last data filed at 10/03/2021 0940 Gross per 24 hour  Intake 522.09 ml  Output 3200 ml  Net -2677.91 ml      10/03/2021    4:27 AM 10/02/2021   12:08 AM 10/01/2021   12:27 PM  Last 3 Weights  Weight (lbs) 261 lb 14.5 oz 254 lb 13.6 oz 255 lb 1.2 oz  Weight (kg) 118.8 kg 115.6 kg 115.7 kg      Telemetry    Sinus Rhythm - Personally Reviewed  ECG    No new tracing  Physical Exam   GEN: Frail older male, No acute distress.   Neck: No JVD Cardiac: RRR, no murmurs, rubs, or gallops.  Respiratory: Diminished in bases GI: Soft, nontender, non-distended  MS: 2+ bilateral pitting LE edema to knees  Neuro:  Nonfocal  Psych: Normal affect   Labs    High Sensitivity Troponin:  No results for input(s): "TROPONINIHS" in the last 720 hours.   Chemistry Recent Labs  Lab 09/30/21 1510 10/01/21 0158 10/01/21 1458 10/02/21 0452 10/03/21 0619  NA 125*   < > 128* 131* 130*  K 4.2   < >  3.5 4.3 3.0*  CL 90*   < > 90* 88* 90*  CO2 28   < > '28 30 30  '$ GLUCOSE 119*   < > 159* 101* 84  BUN 17   < > '17 16 17  '$ CREATININE 1.18   < > 1.20 1.19 1.11  CALCIUM 8.1*   < > 8.1* 8.6* 7.9*  MG 1.9  --   --  1.6* 1.7  PROT 7.3  --  7.1  --   --   ALBUMIN 1.8*  --  1.6*  --   --   AST 59*  --  54*  --   --   ALT 25  --  21  --   --   ALKPHOS 163*  --  152*  --   --   BILITOT 2.5*  --  2.0*  --   --   GFRNONAA >60   < > >60 >60 >60  ANIONGAP 7   < > '10 13 10   '$ < > = values in this interval  not displayed.    Lipids  Recent Labs  Lab 10/02/21 0452  CHOL 104  TRIG 40  HDL <10*  LDLCALC NOT CALCULATED  CHOLHDL NOT CALCULATED    Hematology Recent Labs  Lab 09/30/21 1510 10/01/21 0158 10/01/21 1633  WBC 14.2* 12.8* 11.5*  RBC 3.65* 3.87* 3.49*  HGB 12.1* 12.9* 11.6*  HCT 35.9* 38.7* 34.1*  MCV 98.4 100.0 97.7  MCH 33.2 33.3 33.2  MCHC 33.7 33.3 34.0  RDW 16.5* 16.5* 16.4*  PLT 214 211 207   Thyroid No results for input(s): "TSH", "FREET4" in the last 168 hours.  BNP Recent Labs  Lab 09/30/21 1510  BNP 125.8*    DDimer No results for input(s): "DDIMER" in the last 168 hours.   Radiology    VAS [place] UPPER EXTREMITY VENOUS DUPLEX  Result Date: 10/02/2021 UPPER VENOUS STUDY  Patient Name:  ERMIN PARISIEN  Date of Exam:   10/02/2021 Medical Rec #: [id number]       Accession #:    [id number] Date of Birth: 08-11-1940       Patient Gender: M Patient Age:   81 years Exam Location:  [facility] Procedure:      VAS [place] UPPER EXTREMITY VENOUS DUPLEX Referring Phys: Sander Radon --------------------------------------------------------------------------------  Indications: Edema Comparison Study: no prior Performing Technologist: Archie Patten RVS  Examination Guidelines: A complete evaluation includes B-mode imaging, spectral Doppler, color Doppler, and power Doppler as needed of all accessible portions of each vessel. Bilateral testing is considered an integral part of a  complete examination. Limited examinations for reoccurring indications may be performed as noted.  Right Findings: +----------+------------+---------+-----------+----------+-----------------+ RIGHT     CompressiblePhasicitySpontaneousProperties     Summary      +----------+------------+---------+-----------+----------+-----------------+ IJV           Full       Yes       Yes                                +----------+------------+---------+-----------+----------+-----------------+ Subclavian    Full       Yes       Yes                                +----------+------------+---------+-----------+----------+-----------------+ Axillary      Full       Yes       Yes                                +----------+------------+---------+-----------+----------+-----------------+ Brachial      Full       Yes       Yes                                +----------+------------+---------+-----------+----------+-----------------+ Radial        Full                                                    +----------+------------+---------+-----------+----------+-----------------+ Ulnar         Full                                                    +----------+------------+---------+-----------+----------+-----------------+  Cephalic      None                                  Age Indeterminate +----------+------------+---------+-----------+----------+-----------------+ Basilic       Full                                                    +----------+------------+---------+-----------+----------+-----------------+  Left Findings: +----------+------------+---------+-----------+----------+-------+ LEFT      CompressiblePhasicitySpontaneousPropertiesSummary +----------+------------+---------+-----------+----------+-------+ Subclavian               Yes       Yes                      +----------+------------+---------+-----------+----------+-------+  Summary:  Right: No  evidence of deep vein thrombosis in the upper extremity. Findings consistent with age indeterminate superficial vein thrombosis involving the right cephalic vein.  Left: No evidence of thrombosis in the subclavian.  *See table(s) above for measurements and observations.  Diagnosing physician: Jamelle Haring Electronically signed by Jamelle Haring on 10/02/2021 at 4:29:00 PM.    Final     Cardiac Studies   Echo:  09/19/2021    1. Left ventricular ejection fraction, by estimation, is 45 to 50%. The  left ventricle has mildly decreased function. The left ventricle  demonstrates global hypokinesis. The left ventricular internal cavity size  was mildly dilated. Left ventricular  diastolic parameters are consistent with Grade I diastolic dysfunction  (impaired relaxation).   2. Right ventricular systolic function is normal. The right ventricular  size is normal. There is mildly elevated pulmonary artery systolic  pressure.   3. Left atrial size was mildly dilated.   4. Right atrial size was mildly dilated.   5. The mitral valve is normal in structure. Trivial mitral valve  regurgitation. No evidence of mitral stenosis.   6. Tricuspid valve regurgitation is moderate.   7. The aortic valve is tricuspid. Aortic valve regurgitation is trivial.  No aortic stenosis is present.   8. The inferior vena cava is normal in size with greater than 50%  respiratory variability, suggesting right atrial pressure of 3 mmHg.   Patient Profile     81 y.o. male  with a hx of CAD status post PCI and CABG x3 in 1998 who has an ischemic cardiomyopathy admitted with tachycardia and lower extremity swelling.     Assessment & Plan    HFmrEF -- LVEF noted at 45-50%, G1DD on 9/7. BNP 125, albumin 1.6. Net - 4.5L Remains significantly volume overloaded on exam with 3+ LE edema.  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 Agree with assessment and plan.  Patient feels well.  No shortness of breath or chest pain.  Continues to be on IV diuretic therapy with net urine output at -4570.  He continues to be on furosemide 80 mg IV twice daily in addition to spironolactone.  EF 45 to 50% on echo.  Sodium at 130, improved from 125.  Continues to have 2-3+ lower extremity edema, improved, now more soft.  We will recheck laboratory in a.m. including BNP.  Patient will undergo bronchoscopy with biopsy tomorrow for further evaluation of his large cavitary pneumonia.  Troy Sine, MD, [facility] 10/03/2021 11:34 AM

## 2021-10-03 NOTE — Progress Notes (Addendum)
Progress Note   Patient: Joseph Bryan:[id number] DOB: Mar 25, 1940 DOA: 09/30/2021     3 DOS: the patient was seen and examined on 10/03/2021   Brief hospital course: Joseph Bryan was admitted to the hospital with the working diagnosis of decompensated heart failure.   81 yo male with the past medical history of coronary artery disease, heart failure, non alcoholic liver cirrhosis, hypertension and CKD who presented with lower extremity edema. Recent hospitalization for heart failure 09/06 to 09/09 treated for heart failure and cavitary pneumonia. On his initial physical examination his blood pressure was 114/55, HR 93, RR 22 and 02 saturation 97%, lungs with no wheezing or rales, heart with S1 and S2 present and rhythmic, abdomen not distended and positive lower extremity edema.   Na 129, K 4,2 CL 90 bicarbonate 28 glucose 119 bun 17 cr 1,18  BNP 125  Wbc 14,2 hgb 12,1 plt 214   Ct chest with bilateral ground glass opacities, mild paraseptal emphysema and bronchiectasis. Positive right upper lobe 6.8 x 5.0 cm increased size cavitation pneumonia / pulmonary abscess.   EKG 125 bpm, left axis deviation, right bundle branch block, sinus with no significant ST segment or T wave changes, noisy baseline.   Patient has been placed on furosemide for diuresis.  Pulmonary consulted for worsening cavitary lesion despite antibiotic therapy.   09/21 patient is diuresing well but continue volume overloaded. Plan for diagnostic bronchoscopy tomorrow.   Assessment and Plan: * Acute on chronic systolic CHF (congestive heart failure) (HCC) Echocardiogram with reduced LV systolic function with EF 45 to 50%, global hypokinesis. Preserved RV systolic function, with moderate TR.    Urine output is 9,562 mmHg Systolic blood pressure is 125 to 139 mmHg.   Continue diuresis with IV furosemide and spironolactone.  Add SGLT 2 inh.  Continue midodrine for blood pressure support. Patient has Unna boots. Right  upper extremity with age indeterminate superficial vein thrombosis involving the right cephalic vein.    Cavitary pneumonia Patient with right upper lobe cavitary pneumonia, Lesion size has increased despite antibiotic therapy for the last 14 days.  Patient has been afebrile and cultures with no growth. Oxymetry is 93% on room air.    Antibiotic therapy with IV Vancomycin and Unasyn.  Plan for further work up with bronchoscopy in am.    Hyponatremia Hypervolemic hyponatremia, hypomagnesemia   Patient tolerating well diuresis with furosemide, renal function today with a serum cr at 1,1 with K at 3,0 and serum bicarbonate at 30. Na 130   Add 40 meq Kc x 2 doses  And 2 g mag sulfate  Plan to continue diuresis with furosemide and spironolactone will add SGLT 2 ing to augment diuresis.   CAD- s/p CABG in 1998 - patent grafts on cath in 2010 No chest pain  HTN (hypertension) Continue midodrine for blood pressure support.   Class 1 obesity Calculated BMI is 33,6  Patient with anasarca suspect calorie protein malnutrition, will follow up with nutrition recommendations.   Protein-calorie malnutrition, severe Continue with nutritional supplements         Subjective: Patient with no chest pain, dyspnea is improving, along with edema but not back to baseline   Physical Exam: Vitals:   10/03/21 0315 10/03/21 0427 10/03/21 0835 10/03/21 0953  BP:  (!) 126/59    Pulse: (!) 103 96    Resp: (!) '23 18 15   '$ Temp:  97.8 F (36.6 C) 97.9 F (36.6 C)   TempSrc:  Oral Oral  SpO2: 94% 95% 92% 93%  Weight:  118.8 kg    Height:       Neurology awake and alert ENT with mild pallor Cardiovascular with S1 and S2 present and rhythmic with no gallops Respiratory with mild rales but not wheezing or rhonchi Abdomen with no distention  Positive lower extremity edema ++ pitting up to the thighs. Right upper extremity edema  Data Reviewed:    Family Communication: I spoke with  patient's wife at the bedside, we talked in detail about patient's condition, plan of care and prognosis and all questions were addressed.   Disposition: Status is: Inpatient Remains inpatient appropriate because: heart failure and cavitary pneumonia   Planned Discharge Destination: Skilled nursing facility    Author: Tawni Millers, MD 10/03/2021 12:23 PM  For on call review [email or web address].

## 2021-10-03 NOTE — Evaluation (Signed)
Physical Therapy Evaluation Patient Details Name: Joseph Bryan MRN: [id number] DOB: [birth year] Today's Date: 10/03/2021  History of Present Illness  81 y.o. male presents to [facility] on 09/30/2021 with weakness, BLE edema, and volume overload. Imaging concerned for cavitary PNA or pulmonary abscess. PMH includes cancer, CHF, CKD, CAD, HTN, HLD, PNA.  Clinical Impression  Pt presents to PT with deficits in functional mobility, strength, power, endurance, balance, gait. Pt demonstrates generalized weakness, requiring physical assistance for bed mobility and transfers at this time. Pt is at a high risk for falls due to weakness and impaired endurance. Pt will benefit from aggressive mobilization in an effort to reduce falls risk and caregiver burden. PT recommends SNF placement at this time.       Recommendations for follow up therapy are one component of a multi-disciplinary discharge planning process, led by the attending physician.  Recommendations may be updated based on patient status, additional functional criteria and insurance authorization.  Follow Up Recommendations Skilled nursing-short term rehab (<3 hours/day) Can patient physically be transported by private vehicle: No    Assistance Recommended at Discharge Intermittent Supervision/Assistance  Patient can return home with the following  A lot of help with bathing/dressing/bathroom;Two people to help with walking and/or transfers;Assistance with cooking/housework;Assist for transportation;Help with stairs or ramp for entrance    Equipment Recommendations BSC/3in1;Wheelchair (measurements PT)  Recommendations for Other Services       Functional Status Assessment Patient has had a recent decline in their functional status and demonstrates the ability to make significant improvements in function in a reasonable and predictable amount of time.     Precautions / Restrictions Precautions Precautions: Fall;Other  (comment) Precaution Comments: Watch HR Restrictions Weight Bearing Restrictions: No      Mobility  Bed Mobility Overal bed mobility: Needs Assistance Bed Mobility: Supine to Sit, Sit to Supine, Rolling Rolling: Min guard   Supine to sit: Mod assist, HOB elevated Sit to supine: Min assist, HOB elevated   General bed mobility comments: requires assistance to mobilize LEs to edge, pivot hips, HOB elevated and use of railing    Transfers Overall transfer level: Needs assistance Equipment used: 1 person hand held assist Transfers: Sit to/from Stand Sit to Stand: Mod assist, From elevated surface           General transfer comment: verbal cues to increase trunk flexion and anterior lean. Verbal and tactile cues to increase hip extension    Ambulation/Gait Ambulation/Gait assistance:  (deferred 2/2 falls risk and fatigue)                Stairs            Wheelchair Mobility    Modified Rankin (Stroke Patients Only)       Balance Overall balance assessment: Needs assistance Sitting-balance support: No upper extremity supported, Feet supported Sitting balance-Leahy Scale: Fair     Standing balance support: Bilateral upper extremity supported Standing balance-Leahy Scale: Poor Standing balance comment: BUE hand hold                             Pertinent Vitals/Pain Pain Assessment Pain Assessment: No/denies pain    Home Living Family/patient expects to be discharged to:: Private residence Living Arrangements: Spouse/significant other Available Help at Discharge: Family;Available 24 hours/day Type of Home: House Home Access: Stairs to enter Entrance Stairs-Rails: None Entrance Stairs-Number of Steps: 1+1   Home Layout: One level Home Equipment: Conservation officer, nature (2  wheels);Cane - single point;Grab bars - tub/shower;Grab bars - toilet;Shower seat;Other (comment) (lift chair)      Prior Function Prior Level of Function : Needs assist              Mobility Comments: pt was ambulating with use of SPC a few weeks ago prior to initial hospitalization, has been requiring use of walker and assistance from spouse since discharge ADLs Comments: Pt was getting asssit from spouse for ADL's as well as cooking and cleaning     Hand Dominance   Dominant Hand: Right    Extremity/Trunk Assessment   Upper Extremity Assessment Upper Extremity Assessment: Generalized weakness    Lower Extremity Assessment Lower Extremity Assessment: Generalized weakness    Cervical / Trunk Assessment Cervical / Trunk Assessment: Kyphotic  Communication   Communication: No difficulties  Cognition Arousal/Alertness: Awake/alert Behavior During Therapy: WFL for tasks assessed/performed Overall Cognitive Status: Impaired/Different from baseline Area of Impairment: Problem solving                             Problem Solving: Slow processing          General Comments General comments (skin integrity, edema, etc.): tachy up to 133 observed with mobility    Exercises     Assessment/Plan    PT Assessment Patient needs continued PT services  PT Problem List Decreased strength;Decreased range of motion;Decreased activity tolerance;Decreased balance;Decreased mobility;Decreased knowledge of use of DME       PT Treatment Interventions DME instruction;Gait training;Functional mobility training;Therapeutic activities;Therapeutic exercise;Balance training;Neuromuscular re-education;Patient/family education    PT Goals (Current goals can be found in the Care Plan section)  Acute Rehab PT Goals Patient Stated Goal: to get stronger and reduce caregiver burden PT Goal Formulation: With patient Time For Goal Achievement: 10/17/21 Potential to Achieve Goals: Fair    Frequency Min 2X/week     Co-evaluation               AM-PAC PT "6 Clicks" Mobility  Outcome Measure Help needed turning from your back to your side while  in a flat bed without using bedrails?: A Little Help needed moving from lying on your back to sitting on the side of a flat bed without using bedrails?: A Lot Help needed moving to and from a bed to a chair (including a wheelchair)?: A Lot Help needed standing up from a chair using your arms (e.g., wheelchair or bedside chair)?: A Lot Help needed to walk in hospital room?: Total Help needed climbing 3-5 steps with a railing? : Total 6 Click Score: 11    End of Session   Activity Tolerance: Patient tolerated treatment well Patient left: in bed;with call bell/phone within reach;with bed alarm set Nurse Communication: Mobility status PT Visit Diagnosis: Other abnormalities of gait and mobility (R26.89);Difficulty in walking, not elsewhere classified (R26.2)    Time: 6503-5465 PT Time Calculation (min) (ACUTE ONLY): 30 min   Charges:   PT Evaluation $PT Eval Low Complexity: [place], PT, DPT Acute Rehabilitation Office [phone]   Zenaida Niece 10/03/2021, 3:09 PM

## 2021-10-03 NOTE — Evaluation (Signed)
Occupational Therapy Evaluation Patient Details Name: Joseph Bryan MRN: [id number] DOB: 1940/08/10 Today's Date: 10/03/2021   History of Present Illness 81 y.o. male with PMH: CAD s/p PCI, CABG x 3 in 1998, RBBB, ICM, chronic systolic heart failure,nonalcoholic cirrhosis, hypertension, hyperlipidemia, CKD with atrophic Left kidney, and OSA on CPAP presented with hypotension and increased lower extremity swelling; admitted with CHF, PNA   Clinical Impression   Pt admitted as above, presenting with deficits as listed below.  Pt seen for OT eval and ADL retraining session to include ADL's, functional mobility and trasnfers. Pt is currently +2 physical assist Mod-max with initial sit to stand from EOB and then from 3:1 as well. He is total assist +2 for hygiene following 3:1 use. He lives at home with spouse whom reports that pt is requiring more and more assist. They are agreeable to short term rehab at SNF to assist in maximizing independence and decreasing burden of care on spouse. Will follow acutely for OT.     Recommendations for follow up therapy are one component of a multi-disciplinary discharge planning process, led by the attending physician.  Recommendations may be updated based on patient status, additional functional criteria and insurance authorization.   Follow Up Recommendations  Skilled nursing-short term rehab (<3 hours/day)    Assistance Recommended at Discharge Frequent or constant Supervision/Assistance  Patient can return home with the following A lot of help with walking and/or transfers;A little help with walking and/or transfers;A lot of help with bathing/dressing/bathroom;Direct supervision/assist for medications management;Assistance with cooking/housework;Assist for transportation    Functional Status Assessment  Patient has had a recent decline in their functional status and demonstrates the ability to make significant improvements in function in a reasonable and  predictable amount of time.  Equipment Recommendations  Other (comment) (Defer to next venue)    Recommendations for Other Services PT consult     Precautions / Restrictions Precautions Precautions: Fall;Other (comment) Precaution Comments: Watch HR Restrictions Weight Bearing Restrictions: No      Mobility Bed Mobility Overal bed mobility: Needs Assistance Bed Mobility: Supine to Sit, Sit to Supine     Supine to sit: HOB elevated, Mod assist Sit to supine: Mod assist, HOB elevated   General bed mobility comments: Mod assist for bringing bilateral LE's back into bed and guidance of trunk/repositioning.    Transfers Overall transfer level: Needs assistance Equipment used: Rolling walker (2 wheels) Transfers: Sit to/from Stand, Bed to chair/wheelchair/BSC Sit to Stand: Mod assist, +2 physical assistance, +2 safety/equipment, From elevated surface     Step pivot transfers: Mod assist, +2 physical assistance, +2 safety/equipment, From elevated surface     General transfer comment: Cues for hand placement and to power up with LEs, as well as physical assist      Balance Overall balance assessment: Needs assistance Sitting-balance support: Bilateral upper extremity supported, Feet supported Sitting balance-Leahy Scale: Good     Standing balance support: Reliant on assistive device for balance, During functional activity, Bilateral upper extremity supported Standing balance-Leahy Scale: Poor     ADL either performed or assessed with clinical judgement   ADL Overall ADL's : Needs assistance/impaired Eating/Feeding: Set up;Bed level   Grooming: Wash/dry hands;Wash/dry face;Minimal assistance;Bed level Grooming Details (indicate cue type and reason): Pt washing right hand with set up but did not wash left hand - required min A after verbal and tactile cuing Upper Body Bathing: Minimal assistance;Bed level   Lower Body Bathing: Maximal assistance;Sit to/from stand;+2  for physical assistance;+2  for safety/equipment   Upper Body Dressing : Minimal assistance;Set up;Sitting   Lower Body Dressing: Maximal assistance;+2 for physical assistance;+2 for safety/equipment;Sit to/from stand;Sitting/lateral leans   Toilet Transfer: +2 for physical assistance;+2 for safety/equipment;Ambulation;Rolling walker (2 wheels);Moderate assistance;Maximal assistance Toilet Transfer Details (indicate cue type and reason): +2 Mod to Max assist for initial sit to stand from EOB raised. Pt states that he had not been OOB "since tuesday or monday" Toileting- Clothing Manipulation and Hygiene: +2 for physical assistance;+2 for safety/equipment;Sit to/from stand;Maximal assistance;Total assistance Toileting - Clothing Manipulation Details (indicate cue type and reason): Pt required +2 for hygiene after BM on 3:1 - difficulty standing from 3:1 then decreased to Mod A +1 (second person for safety)     Functional mobility during ADLs: Moderate assistance;+2 for physical assistance;+2 for safety/equipment;Rolling walker (2 wheels) General ADL Comments: Pt seen for OT eval and ADL retraining session to include functional mobility and trasnfers. Pt is currently +2 physical assist Mod-max with initial sit to stand from EOB and then from 3:1 as well. He is total assist +2 for hygiene following 3:1 use. He lives at home with spouse whom reports that pt is requiring more and more assist. They are agreeable to short term rehab at SNF to assist in maximizing independence and decreasing burden of care on spouse. Will follow acutely for OT.     Vision Baseline Vision/History: 1 Wears glasses Patient Visual Report: No change from baseline              Pertinent Vitals/Pain Pain Assessment Pain Assessment: No/denies pain     Hand Dominance Right   Extremity/Trunk Assessment Upper Extremity Assessment Upper Extremity Assessment: Overall WFL for tasks assessed;Generalized weakness   Lower  Extremity Assessment Lower Extremity Assessment: Defer to PT evaluation       Communication Communication Communication: No difficulties   Cognition Arousal/Alertness: Awake/alert Behavior During Therapy: WFL for tasks assessed/performed Overall Cognitive Status: Impaired/Different from baseline Area of Impairment: Problem solving, Memory     Problem Solving: Slow processing, Decreased initiation, Difficulty sequencing, Requires verbal cues, Requires tactile cues General Comments: Pt spouse provides much of history, correcting pt often with PLOF and history throughout eval     General Comments  Pt with difficulty powering up into stand during multiple attempts. Monitor HR as pt HR fluctuated in static sitting at EOB and on 3:1 from 100-149. Pt was asymptomatic. RN is aware            Home Living Family/patient expects to be discharged to:: Private residence Living Arrangements: Spouse/significant other Available Help at Discharge: Family;Available 24 hours/day Type of Home: House Home Access: Stairs to enter [employer] of Steps: 1+1 Entrance Stairs-Rails: None Home Layout: One level     Bathroom Shower/Tub: Occupational psychologist: Standard Bathroom Accessibility: Yes   Home Equipment: Conservation officer, nature (2 wheels);Cane - single point;Grab bars - tub/shower;Grab bars - toilet;Shower seat;Other (comment) (Spouse has lift chair - states pt has "taken it" uses all the time)     Prior Functioning/Environment Prior Level of Function : Needs assist  Cognitive Assist : Mobility (cognitive)   Mobility Comments: amb with assist from spouse usiing  RW ADLs Comments: Pt was getting asssit from spouse for ADL's as well as cooking and cleaning        OT Problem List: Decreased knowledge of use of DME or AE;Decreased knowledge of precautions;Obesity;Decreased activity tolerance;Cardiopulmonary status limiting activity;Impaired balance (sitting and/or  standing);Decreased safety awareness  OT Treatment/Interventions: Self-care/ADL training;Patient/family education;Therapeutic activities;DME and/or AE instruction    OT Goals(Current goals can be found in the care plan section) Acute Rehab OT Goals Patient Stated Goal: Pt agreeable to SNF Rehab prior to home OT Goal Formulation: With patient/family Time For Goal Achievement: 10/17/21 Potential to Achieve Goals: Fair  OT Frequency: Min 2X/week    AM-PAC OT "6 Clicks" Daily Activity     Outcome Measure Help from another person eating meals?: A Little Help from another person taking care of personal grooming?: A Lot Help from another person toileting, which includes using toliet, bedpan, or urinal?: Total Help from another person bathing (including washing, rinsing, drying)?: A Lot Help from another person to put on and taking off regular upper body clothing?: A Little Help from another person to put on and taking off regular lower body clothing?: A Lot 6 Click Score: 13   End of Session Equipment Utilized During Treatment: Gait belt;Rolling walker (2 wheels) Nurse Communication: Mobility status;Other (comment) (HR between 100-149 in static sitting at EOB and on 3:1 noted)  Activity Tolerance: Patient limited by lethargy;Patient tolerated treatment well Patient left: in bed;with call bell/phone within reach;with family/visitor present;with nursing/sitter in room  OT Visit Diagnosis: Other abnormalities of gait and mobility (R26.89)                Time: 0165-5374 OT Time Calculation (min): 56 min Charges:  OT General Charges $OT Visit: 1 Visit OT Evaluation $OT Eval Low Complexity: 1 Low OT Treatments $Self Care/Home Management : 23-37 mins  Ukiah Trawick Beth Dixon, OTR/L 10/03/2021, 10:13 AM

## 2021-10-03 NOTE — NC FL2 (Signed)
[place] MEDICAID FL2 LEVEL OF CARE SCREENING TOOL     IDENTIFICATION  Patient Name: Joseph Bryan Birthdate: [birth year] Sex: male Admission Date (Current Location): 09/30/2021  [facility] and [place] Number:  Herbalist and Address:  [facility], [place] [address], [place], [place] [address]      Provider Number: [id number]  Attending Physician Name and Address:  Tawni Millers,*  Relative Name and Phone Number:  Sire Poet [phone], cell [phone]    Current Level of Care: Hospital Recommended Level of Care: [place] Prior Approval Number:    Date Approved/Denied:   PASRR Number: [id number]  Discharge Plan: SNF    Current Diagnoses: Patient Active Problem List   Diagnosis Date Noted   Class 1 obesity 10/02/2021   Protein-calorie malnutrition, severe 10/02/2021   Peripheral edema    Acute on chronic systolic CHF (congestive heart failure) ([place]) 09/30/2021   Lower extremity edema 09/19/2021   HFrEF (heart failure with reduced ejection fraction) ([place]) 09/19/2021   Hypotension 09/18/2021   Hyponatremia 09/18/2021   AKI (acute kidney injury) ([place]) 09/18/2021   Thrombocytopenia ([place]) 09/18/2021   Cavitary pneumonia 09/18/2021   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 06/21/2019   Cholelithiasis with chronic cholecystitis 06/11/2019   Elevated liver enzymes 06/11/2019   Lumbar spinal stenosis 01/16/2017   Hyperlipidemia LDL goal <70 04/30/2016   Elevated LFTs 04/30/2016   Spinal stenosis, lumbar region, with neurogenic claudication 10/19/2015   Leg edema 10/08/2012   OSA on CPAP 10/08/2012   RBBB 10/08/2012   History of MI (myocardial infarction) 10/08/2012   Anasarca 09/01/2012   Abdominal pain, acute, right lower quadrant 08/26/2012   HTN (hypertension) 08/26/2012   Obesity, unspecified 08/26/2012   CAD- s/p CABG in 1998 - patent grafts on cath in 2010 08/26/2012     Orientation RESPIRATION BLADDER Height & Weight     Self, Time, Situation, Place  Normal Continent Weight: 118.8 kg Height:  '6\' 1"'$  (185.4 cm)  BEHAVIORAL SYMPTOMS/MOOD NEUROLOGICAL BOWEL NUTRITION STATUS      Continent Diet (see dc summary)  AMBULATORY STATUS COMMUNICATION OF NEEDS Skin   Extensive Assist Verbally PU Stage and Appropriate Care (stage 2 PU on sacral area)                       Personal Care Assistance Level of Assistance  Bathing, Feeding, Dressing Bathing Assistance: Maximum assistance Feeding assistance: Limited assistance Dressing Assistance: Maximum assistance     Functional Limitations Info  Sight, Hearing, Speech Sight Info: Adequate Hearing Info: Adequate Speech Info: Adequate    SPECIAL CARE FACTORS FREQUENCY  PT (By licensed PT), OT (By licensed OT)     PT Frequency: 5x/week OT Frequency: 5x/week            Contractures Contractures Info: Not present    Additional Factors Info  Allergies, Code Status Code Status Info: DNR Allergies Info: CHG soaps and wipes           Current Medications (10/03/2021):  This is the current hospital active medication list Current Facility-Administered Medications  Medication Dose Route Frequency Provider Last Rate Last Admin   acetaminophen (TYLENOL) tablet 650 mg  650 mg Oral Q6H PRN Opyd, Ilene Qua, MD   650 mg at 10/03/21 0011   Ampicillin-Sulbactam (UNASYN) 3 g in sodium chloride 0.9 % 100 mL IVPB  3 g Intravenous Q6H Arrien, Jimmy Picket, MD 200 mL/hr at  10/03/21 1437 3 g at 10/03/21 1437   aspirin EC tablet 81 mg  81 mg Oral Daily Reino Bellis B, NP   81 mg at 10/03/21 2122   empagliflozin (JARDIANCE) tablet 10 mg  10 mg Oral Daily Arrien, Jimmy Picket, MD   10 mg at 10/03/21 1435   feeding supplement (ENSURE ENLIVE / ENSURE PLUS) liquid 237 mL  237 mL Oral BID BM Arrien, Jimmy Picket, MD   237 mL at 10/02/21 1509   furosemide (LASIX) injection 80 mg  80 mg Intravenous BID  Shela Leff, MD   80 mg at 10/03/21 0828   midodrine (PROAMATINE) tablet 10 mg  10 mg Oral TID WC Shela Leff, MD   10 mg at 10/03/21 1116   spironolactone (ALDACTONE) tablet 25 mg  25 mg Oral Daily Reino Bellis B, NP   25 mg at 10/03/21 0828   vancomycin (VANCOREADY) IVPB 1500 mg/300 mL  1,500 mg Intravenous Q24H Shela Leff, MD 150 mL/hr at 10/02/21 2346 1,500 mg at 10/02/21 2346     Discharge Medications: Please see discharge summary for a list of discharge medications.  Relevant Imaging Results:  Relevant Lab Results:   Additional Information SS#: [id number]  Zenon Mayo, RN

## 2021-10-03 NOTE — Care Management Important Message (Signed)
Important Message  Patient Details  Name: ZAYLAN KISSOON MRN: [id number] Date of Birth: 1940/03/24   Medicare Important Message Given:  Yes     Shelda Altes 10/03/2021, 10:48 AM

## 2021-10-03 NOTE — Progress Notes (Signed)
Heart Failure Nurse Navigator Progress Note  Spoke with patient and spouse to assess for HV TOC readiness. Pt has some concern with leaving the hospital too quickly as he felt he had at a prior hospitalization. Plan is to attend rehab facility to gain strength before going home. Plan for bronch tomorrow.   Reviewed fluid modification with pt and spouse as bedside staff states he has been drinking more than ordered. Encouraged use of sugar-free mints/hard candies and ice chips for dry mouth. Continues on IV lasix, has urinary catch device to suction.   Pt agreeable to HV TOC appointment if unable to get close follow up appt with Dr. Claiborne Billings upon discharge. Will follow this hospitalization to determine date and appropriateness of HV TOC appt.     Education Assessment and Provision:  Detailed education and instructions provided on heart failure disease management including the following:  Signs and symptoms of Heart Failure When to call the physician Importance of daily weights Low sodium diet Fluid restriction Medication management Anticipated future follow-up appointments  Patient education given on each of the above topics.  Patient acknowledges understanding via teach back method and acceptance of all instructions.  Education Materials:  "Living Better With Heart Failure" Booklet, HF zone tool, & Daily Weight Tracker Tool.  Patient has scale at home: yes Patient has pill box at home: yes     Pricilla Holm, MSN, RN Heart Failure Nurse Navigator

## 2021-10-03 NOTE — TOC Initial Note (Signed)
Transition of Care [facility]) - Initial/Assessment Note    Patient Details  Name: Joseph Bryan MRN: [id number] Date of Birth: 06-27-1940  Transition of Care [facility]) CM/SW Contact:    Bethann Berkshire, [place] Phone Number: 10/03/2021, 3:26 PM  Clinical Narrative:                  CSW met with pt to discuss SNF rec. Pt lives in [place] with his wife; his daughter Cecille Rubin lives near by. Pt has been to [facility] years ago. He is agreeable to SNF workup; [facility] would be his first choice. TOC will complete fl2 and fax SNF bed requests in hub.   Expected Discharge Plan: Skilled Nursing Facility Barriers to Discharge: Continued Medical Work up   Patient Goals and CMS Choice        Expected Discharge Plan and Services Expected Discharge Plan: [place]                                              Prior Living Arrangements/Services     Patient language and need for interpreter reviewed:: Yes        Need for Family Participation in Patient Care: No (Comment) Care giver support system in place?: Yes (comment)   Criminal Activity/Legal Involvement Pertinent to Current Situation/Hospitalization: No - Comment as needed  Activities of Daily Living      Permission Sought/Granted                  Emotional Assessment Appearance:: Appears stated age Attitude/Demeanor/Rapport: Engaged Affect (typically observed): Accepting Orientation: : Oriented to Self, Oriented to Place, Oriented to  Time, Oriented to Situation Alcohol / Substance Use: Not Applicable Psych Involvement: No (comment)  Admission diagnosis:  Hyponatremia [E87.1] Peripheral edema [R60.9] CHF exacerbation ([place]) [I50.9] Cavitary pneumonia [J18.9, J98.4] Patient Active Problem List   Diagnosis Date Noted   Class 1 obesity 10/02/2021   Protein-calorie malnutrition, severe 10/02/2021   Peripheral edema    Acute on chronic systolic CHF (congestive heart failure) ([place]) 09/30/2021    Lower extremity edema 09/19/2021   HFrEF (heart failure with reduced ejection fraction) ([place]) 09/19/2021   Hypotension 09/18/2021   Hyponatremia 09/18/2021   AKI (acute kidney injury) ([place]) 09/18/2021   Thrombocytopenia ([place]) 09/18/2021   Cavitary pneumonia 09/18/2021   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 06/21/2019   Cholelithiasis with chronic cholecystitis 06/11/2019   Elevated liver enzymes 06/11/2019   Lumbar spinal stenosis 01/16/2017   Hyperlipidemia LDL goal <70 04/30/2016   Elevated LFTs 04/30/2016   Spinal stenosis, lumbar region, with neurogenic claudication 10/19/2015   Leg edema 10/08/2012   OSA on CPAP 10/08/2012   RBBB 10/08/2012   History of MI (myocardial infarction) 10/08/2012   Anasarca 09/01/2012   Abdominal pain, acute, right lower quadrant 08/26/2012   HTN (hypertension) 08/26/2012   Obesity, unspecified 08/26/2012   CAD- s/p CABG in 1998 - patent grafts on cath in 2010 08/26/2012   PCP:  Leonard Downing, MD Pharmacy:   [place], [place] [address] Phone: [phone] Fax: [phone]  [facility] ([place], [place] [facility] [address] [place] [address] [place] [address] Phone: [phone] Fax: [phone]  [facility] ([facility]) - [place], [place] [address] Phone: [phone] Fax: [place] [address] [place] [address] [place] [address] Phone: [phone] Fax: [phone]     Social Determinants of Health (SDOH) Interventions    Readmission Risk Interventions     No data to display

## 2021-10-03 NOTE — Plan of Care (Signed)
On call contacted overnight re: unrelenting pain from edematous extremities. Tylenol & oxycodone given.     Problem: Education: Goal: Ability to demonstrate management of disease process will improve Outcome: Progressing Goal: Ability to verbalize understanding of medication therapies will improve Outcome: Progressing Goal: Individualized Educational Video(s) Outcome: Progressing   Problem: Activity: Goal: Capacity to carry out activities will improve Outcome: Progressing   Problem: Cardiac: Goal: Ability to achieve and maintain adequate cardiopulmonary perfusion will improve Outcome: Progressing   Problem: Education: Goal: Knowledge of General Education information will improve Description: Including pain rating scale, medication(s)/side effects and non-pharmacologic comfort measures Outcome: Progressing   Problem: Health Behavior/Discharge Planning: Goal: Ability to manage health-related needs will improve Outcome: Progressing   Problem: Clinical Measurements: Goal: Ability to maintain clinical measurements within normal limits will improve Outcome: Progressing Goal: Will remain free from infection Outcome: Progressing Goal: Diagnostic test results will improve Outcome: Progressing Goal: Respiratory complications will improve Outcome: Progressing Goal: Cardiovascular complication will be avoided Outcome: Progressing   Problem: Activity: Goal: Risk for activity intolerance will decrease Outcome: Progressing   Problem: Nutrition: Goal: Adequate nutrition will be maintained Outcome: Progressing   Problem: Coping: Goal: Level of anxiety will decrease Outcome: Progressing   Problem: Elimination: Goal: Will not experience complications related to bowel motility Outcome: Progressing Goal: Will not experience complications related to urinary retention Outcome: Progressing   Problem: Pain Managment: Goal: General experience of comfort will improve Outcome:  Progressing   Problem: Safety: Goal: Ability to remain free from injury will improve Outcome: Progressing   Problem: Skin Integrity: Goal: Risk for impaired skin integrity will decrease Outcome: Progressing

## 2021-10-04 DIAGNOSIS — J189 Pneumonia, unspecified organism: Secondary | ICD-10-CM | POA: Diagnosis not present

## 2021-10-04 DIAGNOSIS — I2510 Atherosclerotic heart disease of native coronary artery without angina pectoris: Secondary | ICD-10-CM | POA: Diagnosis not present

## 2021-10-04 DIAGNOSIS — E871 Hypo-osmolality and hyponatremia: Secondary | ICD-10-CM | POA: Diagnosis not present

## 2021-10-04 DIAGNOSIS — I4719 Other supraventricular tachycardia: Secondary | ICD-10-CM

## 2021-10-04 DIAGNOSIS — R609 Edema, unspecified: Secondary | ICD-10-CM | POA: Diagnosis not present

## 2021-10-04 DIAGNOSIS — I471 Supraventricular tachycardia: Secondary | ICD-10-CM

## 2021-10-04 DIAGNOSIS — I5023 Acute on chronic systolic (congestive) heart failure: Secondary | ICD-10-CM | POA: Diagnosis not present

## 2021-10-04 LAB — CBC
HCT: 33.9 % — ABNORMAL LOW (ref 39.0–52.0)
Hemoglobin: 11.9 g/dL — ABNORMAL LOW (ref 13.0–17.0)
MCH: 33.2 pg (ref 26.0–34.0)
MCHC: 35.1 g/dL (ref 30.0–36.0)
MCV: 94.7 fL (ref 80.0–100.0)
Platelets: 176 10*3/uL (ref 150–400)
RBC: 3.58 MIL/uL — ABNORMAL LOW (ref 4.22–5.81)
RDW: 16.6 % — ABNORMAL HIGH (ref 11.5–15.5)
WBC: 9.6 10*3/uL (ref 4.0–10.5)
nRBC: 0 % (ref 0.0–0.2)

## 2021-10-04 LAB — COMPREHENSIVE METABOLIC PANEL
ALT: 21 U/L (ref 0–44)
AST: 51 U/L — ABNORMAL HIGH (ref 15–41)
Albumin: 1.5 g/dL — ABNORMAL LOW (ref 3.5–5.0)
Alkaline Phosphatase: 151 U/L — ABNORMAL HIGH (ref 38–126)
Anion gap: 13 (ref 5–15)
BUN: 18 mg/dL (ref 8–23)
CO2: 28 mmol/L (ref 22–32)
Calcium: 8.3 mg/dL — ABNORMAL LOW (ref 8.9–10.3)
Chloride: 91 mmol/L — ABNORMAL LOW (ref 98–111)
Creatinine, Ser: 1.12 mg/dL (ref 0.61–1.24)
GFR, Estimated: >60 mL/min (ref >60)
Glucose, Bld: 82 mg/dL (ref 70–99)
Potassium: 3.7 mmol/L (ref 3.5–5.1)
Sodium: 132 mmol/L — ABNORMAL LOW (ref 135–145)
Total Bilirubin: 2 mg/dL — ABNORMAL HIGH (ref 0.3–1.2)
Total Protein: 7 g/dL (ref 6.5–8.1)

## 2021-10-04 LAB — BRAIN NATRIURETIC PEPTIDE: B Natriuretic Peptide: 267.8 pg/mL — ABNORMAL HIGH (ref 0.0–100.0)

## 2021-10-04 LAB — MAGNESIUM: Magnesium: 2 mg/dL (ref 1.7–2.4)

## 2021-10-04 MED ORDER — FUROSEMIDE 10 MG/ML IJ SOLN
80.0000 mg | Freq: Every day | INTRAMUSCULAR | Status: DC
  Administered 2021-10-05 – 2021-10-06 (×2): 80 mg via INTRAVENOUS
  Filled 2021-10-04 (×2): qty 8

## 2021-10-04 MED ORDER — METOPROLOL TARTRATE 25 MG PO TABS
25.0000 mg | ORAL_TABLET | Freq: Two times a day (BID) | ORAL | Status: DC
  Administered 2021-10-04 – 2021-10-10 (×11): 25 mg via ORAL
  Filled 2021-10-04 (×13): qty 1

## 2021-10-04 NOTE — Progress Notes (Signed)
Heart Failure Navigator Progress Note  Following this hospitalization to assess for HV TOC readiness.   Bronch scheduled 10/07/21 Cancelled on 9/22 EF 45-50%   Earnestine Leys, BSN, RN Heart Failure Leisure centre manager Chat Only

## 2021-10-04 NOTE — Anesthesia Preprocedure Evaluation (Addendum)
Anesthesia Evaluation  Patient identified by MRN, date of birth, ID band Patient awake    Reviewed: Allergy & Precautions, NPO status , Patient's Chart, lab work & pertinent test results, Unable to perform ROS - Chart review only  Airway Mallampati: III       Dental  (+) Poor Dentition   Pulmonary sleep apnea , pneumonia, former smoker,     + decreased breath sounds      Cardiovascular hypertension, pulmonary hypertension+ CAD, + CABG and +CHF  + dysrhythmias  Rhythm:Irregular Rate:Normal  Echo9/07/2021 1. Left ventricular ejection fraction, by estimation, is 45 to 50%. The left ventricle has mildly decreased function. The left ventricle demonstrates global hypokinesis. The left ventricular internal cavity size was mildly dilated. Left ventricular diastolic parameters are consistent with Grade I diastolic dysfunction (impaired relaxation).  2. Right ventricular systolic function is normal. The right ventricular size is normal. There is mildly elevated pulmonary artery systolic pressure.  3. Left atrial size was mildly dilated.  4. Right atrial size was mildly dilated.  5. The mitral valve is normal in structure. Trivial mitral valve regurgitation. No evidence of mitral stenosis.  6. Tricuspid valve regurgitation is moderate.  7. The aortic valve is tricuspid. Aortic valve regurgitation is trivial. No aortic stenosis is present.  8. The inferior vena cava is normal in size with greater than 50% respiratory variability, suggesting right atrial pressure of 3 mmHg.    Neuro/Psych negative neurological ROS     GI/Hepatic negative GI ROS, Neg liver ROS,   Endo/Other  negative endocrine ROS  Renal/GU Renal disease     Musculoskeletal negative musculoskeletal ROS (+)   Abdominal (+) + obese,   Peds  Hematology negative hematology ROS (+)   Anesthesia Other Findings   Reproductive/Obstetrics                           Anesthesia Physical Anesthesia Plan  ASA: 4  Anesthesia Plan: General   Post-op Pain Management: Minimal or no pain anticipated   Induction: Intravenous  PONV Risk Score and Plan: 2 and Ondansetron, Dexamethasone and Treatment may vary due to age or medical condition  Airway Management Planned: Oral ETT  Additional Equipment:   Intra-op Plan:   Post-operative Plan: Possible Post-op intubation/ventilation  Informed Consent: I have reviewed the patients History and Physical, chart, labs and discussed the procedure including the risks, benefits and alternatives for the proposed anesthesia with the patient or authorized representative who has indicated his/her understanding and acceptance.   Patient has DNR.   Dental advisory given  Plan Discussed with:   Anesthesia Plan Comments:        Anesthesia Quick Evaluation

## 2021-10-04 NOTE — Progress Notes (Addendum)
Progress Note  Patient Name: URIAS SHEEK Date of Encounter: 10/04/2021  Primary Cardiologist: Shelva Majestic, MD  Subjective   More tachycardic today. In tele review it looks like there is abrupt onset and offset between sinus tach in the 90s-low 100s to a faster tachycardia in the 120s-130s. Difficult to discern whether this is atrial flutter versus atrial tach. Admit EKGs appear similar though Dr. Percival Spanish felt this was sinus tach - at the time however he was not having abrupt onset/offset as is demonstrated now.  Bronch was cancelled pending better HR control. Patient continues to feel crummy but cant really put into words how. No CP.   Inpatient Medications    Scheduled Meds:  aspirin EC  81 mg Oral Daily   empagliflozin  10 mg Oral Daily   feeding supplement  237 mL Oral BID BM   [START ON 10/05/2021] furosemide  80 mg Intravenous Daily   metoprolol tartrate  25 mg Oral BID   midodrine  10 mg Oral TID WC   spironolactone  25 mg Oral Daily   Continuous Infusions:  ampicillin-sulbactam (UNASYN) IV 3 g (10/04/21 0808)   vancomycin Stopped (10/04/21 0811)   PRN Meds: acetaminophen   Vital Signs    Vitals:   10/04/21 0410 10/04/21 0446 10/04/21 0557 10/04/21 0729  BP: 121/67  (!) 127/57 123/65  Pulse: (!) 131  (!) 104 (!) 126  Resp: 15  15 (!) 22  Temp: 98.2 F (36.8 C)   97.9 F (36.6 C)  TempSrc: Oral   Oral  SpO2: 95%  96% 93%  Weight:  115.3 kg    Height:        Intake/Output Summary (Last 24 hours) at 10/04/2021 1403 Last data filed at 10/04/2021 1300 Gross per 24 hour  Intake 1976.61 ml  Output 1500 ml  Net 476.61 ml      10/04/2021    4:46 AM 10/03/2021    4:27 AM 10/02/2021   12:08 AM  Last 3 Weights  Weight (lbs) 254 lb 3.1 oz 261 lb 14.5 oz 254 lb 13.6 oz  Weight (kg) 115.3 kg 118.8 kg 115.6 kg     Telemetry    See description above - Personally Reviewed  ECG    Rhythm unclear, ? atrial flutter vs a-tach vs sinus tach - reading out as sinus  tach with RBBB/LAFB - Personally Reviewed  Physical Exam   GEN: No acute distress. Pale. Chronically ill appearing HEENT: Normocephalic, atraumatic, sclera non-icteric. Neck: No JVD or bruits. Cardiac: Regular, tachycardic, no murmurs, rubs, or gallops.  Respiratory: Clear to auscultation bilaterally. Breathing is unlabored. GI: Soft, nontender, non-distended, BS +x 4. MS: no deformity. Extremities: No clubbing or cyanosis. BLE edema with wrapped unna boots. Thighs are nonpitting Neuro:  AAOx3. Follows commands. Psych:  Responds to questions appropriately with a normal affect.  Labs    High Sensitivity Troponin:  No results for input(s): "TROPONINIHS" in the last 720 hours.    Cardiac EnzymesNo results for input(s): "TROPONINI" in the last 168 hours. No results for input(s): "TROPIPOC" in the last 168 hours.   Chemistry Recent Labs  Lab 09/30/21 1510 10/01/21 0158 10/01/21 1458 10/02/21 0452 10/03/21 0619 10/04/21 0441  NA 125*   < > 128* 131* 130* 132*  K 4.2   < > 3.5 4.3 3.0* 3.7  CL 90*   < > 90* 88* 90* 91*  CO2 28   < > _0 GLUCOSE 119*   < >  159* 101* 84 82  BUN 17   < > _0 CREATININE 1.18   < > 1.20 1.19 1.11 1.12  CALCIUM 8.1*   < > 8.1* 8.6* 7.9* 8.3*  PROT 7.3  --  7.1  --   --  7.0  ALBUMIN 1.8*  --  1.6*  --   --  1.5*  AST 59*  --  54*  --   --  51*  ALT 25  --  21  --   --  21  ALKPHOS 163*  --  152*  --   --  151*  BILITOT 2.5*  --  2.0*  --   --  2.0*  GFRNONAA >60   < > >60 >60 >60 >60  ANIONGAP 7   < > _1 < > = values in this interval not displayed.     Hematology Recent Labs  Lab 10/01/21 0158 10/01/21 1633 10/04/21 0441  WBC 12.8* 11.5* 9.6  RBC 3.87* 3.49* 3.58*  HGB 12.9* 11.6* 11.9*  HCT 38.7* 34.1* 33.9*  MCV 100.0 97.7 94.7  MCH 33.3 33.2 33.2  MCHC 33.3 34.0 35.1  RDW 16.5* 16.4* 16.6*  PLT 211 207 176    BNP Recent Labs  Lab 09/30/21 1510 10/04/21 0441  BNP 125.8* 267.8*     DDimer No  results for input(s): "DDIMER" in the last 168 hours.   Radiology    VAS [place] UPPER EXTREMITY VENOUS DUPLEX  Result Date: 10/02/2021 UPPER VENOUS STUDY  Patient Name:  CHRISTHOPER BUSBEE  Date of Exam:   10/02/2021 Medical Rec #: [id number]       Accession #:    [id number] Date of Birth: September 29, 1940       Patient Gender: M Patient Age:   42 years Exam Location:  [facility] Procedure:      VAS [place] UPPER EXTREMITY VENOUS DUPLEX Referring Phys: Sander Radon --------------------------------------------------------------------------------  Indications: Edema Comparison Study: no prior Performing Technologist: Archie Patten RVS  Examination Guidelines: A complete evaluation includes B-mode imaging, spectral Doppler, color Doppler, and power Doppler as needed of all accessible portions of each vessel. Bilateral testing is considered an integral part of a complete examination. Limited examinations for reoccurring indications may be performed as noted.  Right Findings: +----------+------------+---------+-----------+----------+-----------------+ RIGHT     CompressiblePhasicitySpontaneousProperties     Summary      +----------+------------+---------+-----------+----------+-----------------+ IJV           Full       Yes       Yes                                +----------+------------+---------+-----------+----------+-----------------+ Subclavian    Full       Yes       Yes                                +----------+------------+---------+-----------+----------+-----------------+ Axillary      Full       Yes       Yes                                +----------+------------+---------+-----------+----------+-----------------+ Brachial      Full       Yes       Yes                                +----------+------------+---------+-----------+----------+-----------------+  Radial        Full                                                     +----------+------------+---------+-----------+----------+-----------------+ Ulnar         Full                                                    +----------+------------+---------+-----------+----------+-----------------+ Cephalic      None                                  Age Indeterminate +----------+------------+---------+-----------+----------+-----------------+ Basilic       Full                                                    +----------+------------+---------+-----------+----------+-----------------+  Left Findings: +----------+------------+---------+-----------+----------+-------+ LEFT      CompressiblePhasicitySpontaneousPropertiesSummary +----------+------------+---------+-----------+----------+-------+ Subclavian               Yes       Yes                      +----------+------------+---------+-----------+----------+-------+  Summary:  Right: No evidence of deep vein thrombosis in the upper extremity. Findings consistent with age indeterminate superficial vein thrombosis involving the right cephalic vein.  Left: No evidence of thrombosis in the subclavian.  *See table(s) above for measurements and observations.  Diagnosing physician: Jamelle Haring Electronically signed by Jamelle Haring on 10/02/2021 at 4:29:00 PM.    Final     Cardiac Studies   Venous Duplex 10/02/21 Summary:     Right:  No evidence of deep vein thrombosis in the upper extremity. Findings  consistent  with age indeterminate superficial vein thrombosis involving the right  cephalic  vein.     Left:  No evidence of thrombosis in the subclavian.     2D echo 09/19/21   1. Left ventricular ejection fraction, by estimation, is 45 to 50%. The  left ventricle has mildly decreased function. The left ventricle  demonstrates global hypokinesis. The left ventricular internal cavity size  was mildly dilated. Left ventricular  diastolic parameters are consistent with Grade I diastolic  dysfunction  (impaired relaxation).   2. Right ventricular systolic function is normal. The right ventricular  size is normal. There is mildly elevated pulmonary artery systolic  pressure.   3. Left atrial size was mildly dilated.   4. Right atrial size was mildly dilated.   5. The mitral valve is normal in structure. Trivial mitral valve  regurgitation. No evidence of mitral stenosis.   6. Tricuspid valve regurgitation is moderate.   7. The aortic valve is tricuspid. Aortic valve regurgitation is trivial.  No aortic stenosis is present.   8. The inferior vena cava is normal in size with greater than 50%  respiratory variability, suggesting right atrial pressure of 3 mmHg.   Patient Profile     81 y.o. 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 During that admission felt to be volume overloaded but management difficult due to hypotension requiring midodrine as well as hyponatremia, hypokalemia, hypoalbuminemia, with moderate ascites.   Assessment & Plan    1. Worsening cavitary pneumonia, low-normal O2 sats at times (93-96%) - 6.8 x 5.0 cm cavitating lesion is noted in right upper lobe which is significantly increased in size compared to prior exam and most consistent with cavitating pneumonia or pulmonary abscess - increasing size despite 14 days of infection - pulm plans bronchoscopy/BAL but has since developed arrhythmia so pulm notes indicate they want to see HR better controlled before proceeding  2. Acute on chronic HFmrEF with chronic bilateral lower extremity edema, confounded by cirrhosis with  ascities, severe hypoalbuminemia 1.5 - 2D echo 09/19/21 EF 45-50%, G1DD, global HK, mild elevated PASP, mild BAE, moderate TR - EF not significantly changed from 2021 - IM decreased IV Lasix to daily - severely low albumin likely contributing to presentation and impressive edema - it is rarely the case that we can resolve edema from a patient with albumin this low - will defer mgmt of low albumin to primary team  3. Paroxysmal tachycardia - noted on admit as well - will ask Dr. Claiborne Billings to review telemetry - I believe he is having an intermittent supraventricular tachycardia (flutter versus a-tach?) given the abrupt onset/offset mechanism, can more easily see P waves when he comes out of this - started on metoprolol today by IM - amio not ideal with liver disease, limited options for rx with hypotension - check TSH with AM labs  4. CAD s/p MI, PCI, CABG 1998, patent grafts 2010 - no angina - remains on ASA - no statin due to liver disease  5. Essential HTN - current rx: spironolactone 32m daily, IV Lasix decreased to 860mtoday, started on Lopressor 2548mID today, requires midodrine - BP OK, follow in context above  6. CKD stage 2 with electrolyte disturbance - last Mg 2.0, K 3.7, Na 132, Cr 1.12  7.  RUE superficial vein thrombosis involving the right cephalic vein - per IM  Remainder of medical issues per IM. Concerned about his overall prognosis given the multiple medical issues at hand.  For questions or updates, please contact [facility] consult [email or web address] for contact info under Cardiology/STEMI.  Signed, DayCharlie PitterA-C 10/04/2021, 2:03 PM     Patient seen and examined. Agree with assessment and plan.  Patient was restarted back on beta-blocker metoprolol tartrate 25 mg twice a day.  His heart rate has had periods of abrupt increase and decrease suggesting possible intermittent SVT, a flutter or atrial tachycardia with prompt termination.  Presently, he  is in sinus rhythm with heart rates in the 70s with clearly discernible P waves.  Koska P was canceled today and is tentatively scheduled for Monday.  No chest pain.  Lower extremity edema has significantly improved since admission and net urine output is [id number] ThoTroy SineD, FACMeridian Plastic Surgery Center22/2023 3:04 PM

## 2021-10-04 NOTE — Progress Notes (Signed)
Physical Therapy Treatment Patient Details Name: RAMIE ERMAN MRN: [id number] DOB: [birth year] Today's Date: 10/04/2021   History of Present Illness 81 y.o. male presents to [facility] hospital on 09/30/2021 with weakness, BLE edema, and volume overload. Imaging concerned for cavitary PNA or pulmonary abscess. PMH includes cancer, CHF, CKD, CAD, HTN, HLD, PNA.    PT Comments    Pt tolerates treatment well, transferring multiple times this session. Pt is able to progress to out of bed transfers, performing a step pivot and utilizing the STEDY to transfer with max cues for sequencing. Pt will benefit from continued aggressive mobilization in an effort to improve strength and endurance. It is highly encouraged for staff to assist pt into recliner over the weekend, either via +2 transfer with STEDY or hoyer lift. PT continues to recommend SNF placement.   Recommendations for follow up therapy are one component of a multi-disciplinary discharge planning process, led by the attending physician.  Recommendations may be updated based on patient status, additional functional criteria and insurance authorization.  Follow Up Recommendations  Skilled nursing-short term rehab (<3 hours/day) Can patient physically be transported by private vehicle: No   Assistance Recommended at Discharge Intermittent Supervision/Assistance  Patient can return home with the following A lot of help with bathing/dressing/bathroom;Two people to help with walking and/or transfers;Assistance with cooking/housework;Assist for transportation;Help with stairs or ramp for entrance   Equipment Recommendations  BSC/3in1;Wheelchair (measurements PT)    Recommendations for Other Services       Precautions / Restrictions Precautions Precautions: Fall;Other (comment) Precaution Comments: Watch HR Restrictions Weight Bearing Restrictions: No     Mobility  Bed Mobility Overal bed mobility: Needs Assistance Bed Mobility: Supine to Sit,  Sit to Supine     Supine to sit: Min assist, HOB elevated Sit to supine: Min assist   General bed mobility comments: verbal cues for sequencing, hand placement. physical assistance for LE management    Transfers Overall transfer level: Needs assistance Equipment used: 1 person hand held assist Transfers: Sit to/from Stand, Bed to chair/wheelchair/BSC Sit to Stand: Mod assist, From elevated surface   Step pivot transfers: Mod assist, From elevated surface       General transfer comment: pt requires cues for hand placement, foot placement, rocking to gain momentum, and increased trunk flexion, all in an effort to asend to standing. Pt transfers from bed to [facility], stands twice from [facility] in STEDY, and then transfers back to bed via STEDY Transfer via Lift Equipment: Stedy  Ambulation/Gait                   Stairs             Wheelchair Mobility    Modified Rankin (Stroke Patients Only)       Balance Overall balance assessment: Needs assistance Sitting-balance support: No upper extremity supported, Feet supported Sitting balance-Leahy Scale: Fair     Standing balance support: Bilateral upper extremity supported, Reliant on assistive device for balance Standing balance-Leahy Scale: Poor                              Cognition Arousal/Alertness: Awake/alert Behavior During Therapy: WFL for tasks assessed/performed Overall Cognitive Status: Within Functional Limits for tasks assessed  Exercises      General Comments General comments (skin integrity, edema, etc.): VSS on RA      Pertinent Vitals/Pain Pain Assessment Pain Assessment: No/denies pain    Home Living                          Prior Function            PT Goals (current goals can now be found in the care plan section) Acute Rehab PT Goals Patient Stated Goal: to get stronger and reduce caregiver  burden Progress towards PT goals: Progressing toward goals    Frequency    Min 2X/week      PT Plan Current plan remains appropriate    Co-evaluation              AM-PAC PT "6 Clicks" Mobility   Outcome Measure  Help needed turning from your back to your side while in a flat bed without using bedrails?: A Little Help needed moving from lying on your back to sitting on the side of a flat bed without using bedrails?: A Little Help needed moving to and from a bed to a chair (including a wheelchair)?: A Lot Help needed standing up from a chair using your arms (e.g., wheelchair or bedside chair)?: A Lot Help needed to walk in hospital room?: Total Help needed climbing 3-5 steps with a railing? : Total 6 Click Score: 12    End of Session   Activity Tolerance: Patient tolerated treatment well Patient left: in bed;with call bell/phone within reach;with bed alarm set Nurse Communication: Mobility status PT Visit Diagnosis: Other abnormalities of gait and mobility (R26.89);Difficulty in walking, not elsewhere classified (R26.2)     Time: 8841-6606 PT Time Calculation (min) (ACUTE ONLY): 39 min  Charges:  $Therapeutic Activity: 38-52 mins                     Zenaida Niece, PT, DPT Acute Rehabilitation Office [place] Lucylle Foulkes 10/04/2021, 4:29 PM

## 2021-10-04 NOTE — Progress Notes (Signed)
   NAME:  Joseph Bryan, MRN:  [id number], DOB:  28-Aug-1940, LOS: 4 ADMISSION DATE:  09/30/2021, CONSULTATION DATE: 9/20 REFERRING MD: Dr. 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  He was recently admitted from 9/6-9/9 and treated for decompensated heart failure with IV diuresis.  There was some concern during that admission for pulmonary embolism and CT angiogram of the chest was done revealing right upper lobe cavitary pneumonia.  The patient had no infectious complaints and no clinical signs of pneumonia.  He was evaluated by pulmonary at that time and was recommended for 1 month of Augmentin and pulmonary follow-up at that time for additional imaging.  Unfortunately the patient again presents to [facility] emergency department on 9/18 with complaints of lower extremity edema and generalized weakness.  The patient and his wife report good compliance with medical regimen including diuretics and antibiotics, however, lower extremity edema did worsen.  He was admitted to the hospitalist service for CHF treatment and cardiology was consulted.  CTA was repeated and was again negative for PE, however, showed an increase in size of the right upper lobe cavitary lesion despite antibiotics.  PCCM consulted for further recommendations.  The patient again denies infectious symptoms such as fever, chills, and productive cough.  Pertinent  Medical History   has a past medical history of Cancer ([place]), CHF exacerbation ([place]) (09/30/2021), Chronic kidney disease, Coronary artery disease, Glaucoma, History of kidney stones, Hyperlipidemia, Hypertension, Neuromuscular disorder ([place]), Neuropathy, Pneumonia, Renal insufficiency, and Sleep apnea with use of continuous positive airway  pressure (CPAP).   Significant Hospital Events: Including procedures, antibiotic start and stop dates in addition to other pertinent events   9/18 admit for CHF exacerbation. Cavitary lesion worse on CT.   Interim History / Subjective:  No events. Denies cough although wife at bedside says he has been coughing. Breathing stable. Now in Afib/RVR  Objective   Blood pressure 123/65, pulse (!) 126, temperature 97.9 F (36.6 C), temperature source Oral, resp. rate (!) 22, height _0  (1.854 m), weight 115.3 kg, SpO2 93 %.        Intake/Output Summary (Last 24 hours) at 10/04/2021 0816 Last data filed at 10/04/2021 0447 Gross per 24 hour  Intake 1856.61 ml  Output 2350 ml  Net -493.39 ml    Filed Weights   10/02/21 0008 10/03/21 0427 10/04/21 0446  Weight: 115.6 kg 118.8 kg 115.3 kg    Examination: Chronically ill man lying in bed MM dry Heart sounds tachy, regular, ext warm Legs in TED hose, ongoing anasarca Moves all 4 ext to command but weak  Resolved Hospital Problem list     Assessment & Plan:   Right upper lobe cavitary lesion: size increasing despite 14 days augmentin. Characteristics of growth argue for infection. -Agree with empiric antibiotics as recommended by ID (Unasyn, Vanco) -f/u sputum culture - Anesthesia would like to see his HR better controlled before we proceed with bronchoscopy/BAL; will need to reschedule for Monday; family updated; please make NPO Sun night, okay for temporary AC in interim if desired by primary/cards; see that BB already ordered  Acute on chronic HFrEF  Atrial fibrillation -Management per cardiology  Erskine Emery MD PCCM

## 2021-10-04 NOTE — Progress Notes (Signed)
Progress Note   Patient: Joseph Bryan ATF:[id number] DOB: May 15, 1940 DOA: 09/30/2021     4 DOS: the patient was seen and examined on 10/04/2021   Brief hospital course: Joseph Bryan was admitted to the hospital with the working diagnosis of decompensated heart failure.   81 yo male with the past medical history of coronary artery disease, heart failure, non alcoholic liver cirrhosis, hypertension and CKD who presented with lower extremity edema. Recent hospitalization for heart failure 09/06 to 09/09 treated for heart failure and cavitary pneumonia. On his initial physical examination his blood pressure was 114/55, HR 93, RR 22 and 02 saturation 97%, lungs with no wheezing or rales, heart with S1 and S2 present and rhythmic, abdomen not distended and positive lower extremity edema.   Na 129, K 4,2 CL 90 bicarbonate 28 glucose 119 bun 17 cr 1,18  BNP 125  Wbc 14,2 hgb 12,1 plt 214   Ct chest with bilateral ground glass opacities, mild paraseptal emphysema and bronchiectasis. Positive right upper lobe 6.8 x 5.0 cm increased size cavitation pneumonia / pulmonary abscess.   EKG 125 bpm, left axis deviation, right bundle branch block, sinus with no significant ST segment or T wave changes, noisy baseline.   Patient has been placed on furosemide for diuresis.  Pulmonary consulted for worsening cavitary lesion despite antibiotic therapy.   09/21 patient is diuresing well but continue volume overloaded. Plan for diagnostic bronchoscopy tomorrow.  09/22 patient with tachycardia in the 120 to 130 bpm, cancelled bronchoscopy for today and will be rescheduled for Monday.   Assessment and Plan: * Acute on chronic systolic CHF (congestive heart failure) (HCC) Echocardiogram with reduced LV systolic function with EF 45 to 50%, global hypokinesis. Preserved RV systolic function, with moderate TR.    Urine output is 2,706 mmHg Systolic blood pressure is 120 mmHg.   Continue midodrine for blood pressure  support. Patient has Unna boots. Diuresis with furosemide, empagliflozin and spironolactone.   Sinus tachycardia, early this am patient has developed tachycardia 120 to 130, looks sinus on telemetry.  Possible intravascular volume down, he has protein malnutrition that can be contributing to his peripheral edema.   Plan to decrease furosemide to daily from BID Add low dose metoprolol for rate control for now, it will benefit his heart failure.  Check 12 lead EKG this am.   Right upper extremity with age indeterminate superficial vein thrombosis involving the right cephalic vein.    Cavitary pneumonia Patient with right upper lobe cavitary pneumonia, Lesion size has increased despite antibiotic therapy for the last 14 days.  Patient has been afebrile and cultures with no growth. Wbc is 9,6  Oxymetry is 93% on room air.    Antibiotic therapy with IV Vancomycin and Unasyn.  Plan for further work up with bronchoscopy during this hospitalization.     Hyponatremia Hypervolemic hyponatremia, hypomagnesemia   Renal function with serum cr at 1,12 with K at 3,7 and serum bicarbonate at 28, bun is 18  Na 132 and Mg 1,5   Add 40 meq Kc x 2 doses  And 4 g mag sulfate  Diuretic therapy with furosemide, spironolactone and empagliflozin.   CAD- s/p CABG in 1998 - patent grafts on cath in 2010 No chest pain  HTN (hypertension) Continue midodrine for blood pressure support.   Class 1 obesity Calculated BMI is 33,6  Patient with anasarca suspect calorie protein malnutrition, will follow up with nutrition recommendations.   Protein-calorie malnutrition, severe Continue with nutritional supplements  Subjective: Patient with no chest pain or dyspnea, edema has been improving   Physical Exam: Vitals:   10/04/21 0410 10/04/21 0446 10/04/21 0557 10/04/21 0729  BP: 121/67  (!) 127/57 123/65  Pulse: (!) 131  (!) 104 (!) 126  Resp: 15  15 (!) 22  Temp: 98.2 F (36.8 C)    97.9 F (36.6 C)  TempSrc: Oral   Oral  SpO2: 95%  96% 93%  Weight:  115.3 kg    Height:       Neurology awake and alert, deconditioned and ill looking appearing  ENT with mild pallor Cardiovascular with S1 and S2 present and regular with no gallops or murmurs Respiratory with scattered rales bilaterally with no wheezing or rhonchi Abdomen with no distention  Lower extremities with unna boots in place, pitting edema dependent zones.  Data Reviewed:    Family Communication: I spoke with patient's wife at the bedside, we talked in detail about patient's condition, plan of care and prognosis and all questions were addressed.    Disposition: Status is: Inpatient Remains inpatient appropriate because: heart failure and IV antibiotic for worsening cavitary pneumonia   Planned Discharge Destination: Home   Author: Tawni Millers, MD 10/04/2021 9:36 AM  For on call review [email or web address].

## 2021-10-04 NOTE — Progress Notes (Signed)
   10/04/21 0410  Vitals  Temp 98.2 F (36.8 C)  Temp Source Oral  BP 121/67  MAP (mmHg) 81  BP Location Right Arm  BP Method Automatic  Patient Position (if appropriate) Lying  Pulse Rate (!) 131  Pulse Rate Source Monitor  Resp 15  Level of Consciousness  Level of Consciousness Alert  MEWS COLOR  MEWS Score Color Yellow  Oxygen Therapy  SpO2 95 %  O2 Device Room Air  MEWS Score  MEWS Temp 0  MEWS Systolic 0  MEWS Pulse 3  MEWS RR 0  MEWS LOC 0  MEWS Score 3

## 2021-10-05 DIAGNOSIS — I471 Supraventricular tachycardia: Secondary | ICD-10-CM

## 2021-10-05 DIAGNOSIS — E871 Hypo-osmolality and hyponatremia: Secondary | ICD-10-CM | POA: Diagnosis not present

## 2021-10-05 DIAGNOSIS — R000 Tachycardia, unspecified: Secondary | ICD-10-CM | POA: Diagnosis not present

## 2021-10-05 DIAGNOSIS — I5023 Acute on chronic systolic (congestive) heart failure: Secondary | ICD-10-CM | POA: Diagnosis not present

## 2021-10-05 DIAGNOSIS — J189 Pneumonia, unspecified organism: Secondary | ICD-10-CM | POA: Diagnosis not present

## 2021-10-05 DIAGNOSIS — I2510 Atherosclerotic heart disease of native coronary artery without angina pectoris: Secondary | ICD-10-CM | POA: Diagnosis not present

## 2021-10-05 LAB — CBC
HCT: 32.7 % — ABNORMAL LOW (ref 39.0–52.0)
Hemoglobin: 11.3 g/dL — ABNORMAL LOW (ref 13.0–17.0)
MCH: 33.2 pg (ref 26.0–34.0)
MCHC: 34.6 g/dL (ref 30.0–36.0)
MCV: 96.2 fL (ref 80.0–100.0)
Platelets: 178 10*3/uL (ref 150–400)
RBC: 3.4 MIL/uL — ABNORMAL LOW (ref 4.22–5.81)
RDW: 16.6 % — ABNORMAL HIGH (ref 11.5–15.5)
WBC: 12.1 10*3/uL — ABNORMAL HIGH (ref 4.0–10.5)
nRBC: 0 % (ref 0.0–0.2)

## 2021-10-05 LAB — BASIC METABOLIC PANEL
Anion gap: 10 (ref 5–15)
BUN: 22 mg/dL (ref 8–23)
CO2: 29 mmol/L (ref 22–32)
Calcium: 8.2 mg/dL — ABNORMAL LOW (ref 8.9–10.3)
Chloride: 91 mmol/L — ABNORMAL LOW (ref 98–111)
Creatinine, Ser: 1.19 mg/dL (ref 0.61–1.24)
GFR, Estimated: >60 mL/min (ref >60)
Glucose, Bld: 93 mg/dL (ref 70–99)
Potassium: 4 mmol/L (ref 3.5–5.1)
Sodium: 130 mmol/L — ABNORMAL LOW (ref 135–145)

## 2021-10-05 LAB — CULTURE, BLOOD (ROUTINE X 2)
Culture: NO GROWTH
Culture: NO GROWTH
Special Requests: ADEQUATE
Special Requests: ADEQUATE

## 2021-10-05 LAB — MAGNESIUM: Magnesium: 2 mg/dL (ref 1.7–2.4)

## 2021-10-05 LAB — VANCOMYCIN, TROUGH: Vancomycin Tr: 34 ug/mL (ref 15–20)

## 2021-10-05 LAB — TSH: TSH: 2.184 u[IU]/mL (ref 0.350–4.500)

## 2021-10-05 LAB — VANCOMYCIN, PEAK: Vancomycin Pk: 54 ug/mL (ref 30–40)

## 2021-10-05 MED ORDER — VANCOMYCIN VARIABLE DOSE PER UNSTABLE RENAL FUNCTION (PHARMACIST DOSING): Status: DC

## 2021-10-05 MED ORDER — VANCOMYCIN HCL 1500 MG/300ML IV SOLN
1500.0000 mg | INTRAVENOUS | Status: DC
  Administered 2021-10-09: 1500 mg via INTRAVENOUS
  Filled 2021-10-05 (×3): qty 300

## 2021-10-05 NOTE — Progress Notes (Signed)
Date and time results received: 10/05/21 2110 (use smartphrase ".now" to insert current time)  Test: vancomycin trough Critical Value: 34  Name of Provider Notified: Antonietta Jewel, [facility]  Orders Received? Or Actions Taken?: Orders Received - See Orders for details

## 2021-10-05 NOTE — Progress Notes (Signed)
Progress Note   Patient: Joseph Bryan:[id number] DOB: [birth year] DOA: 09/30/2021     5 DOS: the patient was seen and examined on 10/05/2021   Brief hospital course: Joseph Bryan was admitted to the hospital with the working diagnosis of decompensated heart failure.   81 yo male with the past medical history of coronary artery disease, heart failure, non alcoholic liver cirrhosis, hypertension and CKD who presented with lower extremity edema. Recent hospitalization for heart failure 09/06 to 09/09 treated for heart failure and cavitary pneumonia. On his initial physical examination his blood pressure was 114/55, HR 93, RR 22 and 02 saturation 97%, lungs with no wheezing or rales, heart with S1 and S2 present and rhythmic, abdomen not distended and positive lower extremity edema.   Na 129, K 4,2 CL 90 bicarbonate 28 glucose 119 bun 17 cr 1,18  BNP 125  Wbc 14,2 hgb 12,1 plt 214   Ct chest with bilateral ground glass opacities, mild paraseptal emphysema and bronchiectasis. Positive right upper lobe 6.8 x 5.0 cm increased size cavitation pneumonia / pulmonary abscess.   EKG 125 bpm, left axis deviation, right bundle branch block, sinus with no significant ST segment or T wave changes, noisy baseline.   Patient has been placed on furosemide for diuresis.  Pulmonary consulted for worsening cavitary lesion despite antibiotic therapy.   09/21 patient is diuresing well but continue volume overloaded. Plan for diagnostic bronchoscopy tomorrow.  09/22 patient with tachycardia in the 120 to 130 bpm, cancelled bronchoscopy for today and will be rescheduled for Monday.  09/23 heart rate has improved with metoprolol.   Assessment and Plan: * Acute on chronic systolic CHF (congestive heart failure) (HCC) Echocardiogram with reduced LV systolic function with EF 45 to 50%, global hypokinesis. Preserved RV systolic function, with moderate TR.    Urine output is 3,149 mmHg Systolic blood pressure has  been 102  mmHg.   Continue midodrine for blood pressure support. Patient has Unna boots.  Atrial tachycardia has improved with metoprolol 25 mg po bid.  Continue diuresis with IV furosemide.  On empagliflozin and spironolactone.   Right upper  extremity with age indeterminate superficial vein thrombosis involving the right cephalic vein.    Cavitary pneumonia Patient with right upper lobe cavitary pneumonia, Lesion size has increased despite antibiotic therapy for the last 14 days.  Patient has been afebrile and cultures with no growth. Wbc is 12,1 Oxymetry is 93% on room air.    Antibiotic therapy with IV Vancomycin and Unasyn.  Plan for further work up with bronchoscopy on Monday.   Hyponatremia Hypervolemic hyponatremia, hypomagnesemia   Volume status is improving, renal function with serum cr at 1,19 with K at 4,0 and serum bicarbonate at 29,  Na is 130. Mg 2,0   Diuretic therapy with furosemide, spironolactone and empagliflozin.   CAD- s/p CABG in 1998 - patent grafts on cath in 2010 No chest pain  HTN (hypertension) Continue midodrine for blood pressure support.   Class 1 obesity Calculated BMI is 33,6  Patient with anasarca suspect calorie protein malnutrition, will follow up with nutrition recommendations.   Protein-calorie malnutrition, severe Continue with nutritional supplements         Subjective: Patient is feeling better, continue to have debility and edema   Physical Exam: Vitals:   10/05/21 0144 10/05/21 0338 10/05/21 0800 10/05/21 1100  BP:  102/61 (!) 102/59 (!) 102/53  Pulse:  76 85 73  Resp:  '18 18 17  '$ Temp:  98.4  F (36.9 C) 98.6 F (37 C) 98.3 F (36.8 C)  TempSrc:  Oral Oral Oral  SpO2:  92% 93% 93%  Weight: 115.8 kg     Height:       Neurology awake and alert ENT with mild pallor Cardiovascular with S1 and S2 present and rhythmic with no gallops Respiratory with no wheezing or rhonchi Abdomen with no distention  Positive  lower extremity pitting up to the hips ++ Unna boots in place.  Data Reviewed:    Family Communication: I spoke with patient's wife at the bedside, we talked in detail about patient's condition, plan of care and prognosis and all questions were addressed.   Disposition: Status is: Inpatient Remains inpatient appropriate because: IV antibiotic therapy, IV furosemide, pending bronchoscopy   Planned Discharge Destination: Skilled nursing facility      Author: Tawni Millers, MD 10/05/2021 12:09 PM  For on call review [email or web address].

## 2021-10-05 NOTE — Progress Notes (Addendum)
Pharmacy Antibiotic Note  Joseph Bryan is a 81 y.o. male for which pharmacy has been consulted for Unasyn (ampicillin-sulbactam) and vancomycin dosing for  cavitary pneumonia .  Recent admission (9/6-9/9) decompensated HF. Discharged on augmentin x 4 weeks for cavitary pna.  WBC up to 12.1, Scr 1.19 (CrCl 64 mL/min). Afebrile. Vancomycin peak 54, vancomycin trough 34 - calculated AUC elevated at 1044.   Plan: Continue Unasyn 3g IV q6h Change vancomycin to 1500 mg IV every 48 hours - will start dose on 9/25'@1000'$  (new estAUC 522) Trend WBC, Fever, Renal function F/u cultures, clinical course, WBC, fever De-escalate when able Levels at steady state  Height: '6\' 1"'$  (185.4 cm) Weight: 115.8 kg (255 lb 4.7 oz) IBW/kg (Calculated) : 79.9  Temp (24hrs), Avg:98.4 F (36.9 C), Min:98.1 F (36.7 C), Max:98.6 F (37 C)  Recent Labs  Lab 09/30/21 1510 10/01/21 0158 10/01/21 1458 10/01/21 1633 10/02/21 0452 10/03/21 0619 10/04/21 0441 10/05/21 0154 10/05/21 1956  WBC 14.2* 12.8*  --  11.5*  --   --  9.6 12.1*  --   CREATININE 1.18 1.13 1.20  --  1.19 1.11 1.12 1.19  --   VANCOTROUGH  --   --   --   --   --   --   --   --  34*  VANCOPEAK  --   --   --   --   --   --   --  54*  --      Estimated Creatinine Clearance: 64.9 mL/min (by C-G formula based on SCr of 1.19 mg/dL).    Allergies  Allergen Reactions   Other Rash and Other (See Comments)    CHG soap and wipes patient states today(06/21/19), he does not recall this allergy. He completed the pre-op chg baths without any problems.    Antimicrobials this admission: unasyn 9/18 >>  vancomycin 9/18 >>  Microbiology results: Pending  Thank you for allowing pharmacy to be a part of this patient's care.  Antonietta Jewel, PharmD, [place] Clinical Pharmacist  Phone: [phone] 10/05/2021 9:17 PM  Please check AMION for all [place] phone numbers After 10:00 PM, call [place] [phone]

## 2021-10-05 NOTE — Progress Notes (Signed)
Progress Note  Patient Name: Joseph Bryan Date of Encounter: 10/05/2021  Primary Cardiologist: Shelva Majestic, MD  Subjective   Tachycardia in the setting of cavitary lung lesion  Inpatient Medications    Scheduled Meds:  aspirin EC  81 mg Oral Daily   empagliflozin  10 mg Oral Daily   feeding supplement  237 mL Oral BID BM   furosemide  80 mg Intravenous Daily   metoprolol tartrate  25 mg Oral BID   midodrine  10 mg Oral TID WC   spironolactone  25 mg Oral Daily   Continuous Infusions:  ampicillin-sulbactam (UNASYN) IV 3 g (10/05/21 0811)   vancomycin 1,500 mg (10/04/21 2211)   PRN Meds: acetaminophen   Vital Signs    Vitals:   10/05/21 0144 10/05/21 0338 10/05/21 0800 10/05/21 1100  BP:  102/61 (!) 102/59 (!) 102/53  Pulse:  76 85 73  Resp:  _0 Temp:  98.4 F (36.9 C) 98.6 F (37 C) 98.3 F (36.8 C)  TempSrc:  Oral Oral Oral  SpO2:  92% 93% 93%  Weight: 115.8 kg     Height:        Intake/Output Summary (Last 24 hours) at 10/05/2021 1159 Last data filed at 10/05/2021 1155 Gross per 24 hour  Intake 780 ml  Output 1175 ml  Net -395 ml      10/05/2021    1:44 AM 10/04/2021    4:46 AM 10/03/2021    4:27 AM  Last 3 Weights  Weight (lbs) 255 lb 4.7 oz 254 lb 3.1 oz 261 lb 14.5 oz  Weight (kg) 115.8 kg 115.3 kg 118.8 kg     Telemetry    Sinus rhythm, PACs, minimal ventricular ectopy - Personally Reviewed  ECG    NA- Personally Reviewed  Physical Exam   GEN: No acute distress. Pale. Chronically ill appearing HEENT: Normocephalic, atraumatic, sclera non-icteric. Neck: No sig JVD or bruits. Cardiac: Regular, tachycardic, no murmurs, rubs, or gallops.  Respiratory: Clear to auscultation bilaterally. Breathing is unlabored. Decreased BS , limited exam GI: Soft, nontender, non-distended, MS: no deformity. Extremities: Legs wrapped/edema Neuro:  AAOx3. Follows commands. Psych:  Responds to questions appropriately with a normal affect.  Labs     High Sensitivity Troponin:  No results for input(s): "TROPONINIHS" in the last 720 hours.    Cardiac EnzymesNo results for input(s): "TROPONINI" in the last 168 hours. No results for input(s): "TROPIPOC" in the last 168 hours.   Chemistry Recent Labs  Lab 09/30/21 1510 10/01/21 0158 10/01/21 1458 10/02/21 0452 10/03/21 0619 10/04/21 0441 10/05/21 0154  NA 125*   < > 128*   < > 130* 132* 130*  K 4.2   < > 3.5   < > 3.0* 3.7 4.0  CL 90*   < > 90*   < > 90* 91* 91*  CO2 28   < > 28   < > _1 GLUCOSE 119*   < > 159*   < > 84 82 93  BUN 17   < > 17   < > _2 CREATININE 1.18   < > 1.20   < > 1.11 1.12 1.19  CALCIUM 8.1*   < > 8.1*   < > 7.9* 8.3* 8.2*  PROT 7.3  --  7.1  --   --  7.0  --   ALBUMIN 1.8*  --  1.6*  --   --  1.5*  --  AST 59*  --  54*  --   --  51*  --   ALT 25  --  21  --   --  21  --   ALKPHOS 163*  --  152*  --   --  151*  --   BILITOT 2.5*  --  2.0*  --   --  2.0*  --   GFRNONAA >60   < > >60   < > >60 >60 >60  ANIONGAP 7   < > 10   < > _0 < > = values in this interval not displayed.     Hematology Recent Labs  Lab 10/01/21 1633 10/04/21 0441 10/05/21 0154  WBC 11.5* 9.6 12.1*  RBC 3.49* 3.58* 3.40*  HGB 11.6* 11.9* 11.3*  HCT 34.1* 33.9* 32.7*  MCV 97.7 94.7 96.2  MCH 33.2 33.2 33.2  MCHC 34.0 35.1 34.6  RDW 16.4* 16.6* 16.6*  PLT 207 176 178    BNP Recent Labs  Lab 09/30/21 1510 10/04/21 0441  BNP 125.8* 267.8*     DDimer No results for input(s): "DDIMER" in the last 168 hours.   Radiology    No results found.  Cardiac Studies   Venous Duplex 10/02/21 Summary:     Right:  No evidence of deep vein thrombosis in the upper extremity. Findings  consistent  with age indeterminate superficial vein thrombosis involving the right  cephalic  vein.     Left:  No evidence of thrombosis in the subclavian.     2D echo 09/19/21   1. Left ventricular ejection fraction, by estimation, is 45 to 50%. The  left ventricle  has mildly decreased function. The left ventricle  demonstrates global hypokinesis. The left ventricular internal cavity size  was mildly dilated. Left ventricular  diastolic parameters are consistent with Grade I diastolic dysfunction  (impaired relaxation).   2. Right ventricular systolic function is normal. The right ventricular  size is normal. There is mildly elevated pulmonary artery systolic  pressure.   3. Left atrial size was mildly dilated.   4. Right atrial size was mildly dilated.   5. The mitral valve is normal in structure. Trivial mitral valve  regurgitation. No evidence of mitral stenosis.   6. Tricuspid valve regurgitation is moderate.   7. The aortic valve is tricuspid. Aortic valve regurgitation is trivial.  No aortic stenosis is present.   8. The inferior vena cava is normal in size with greater than 50%  respiratory variability, suggesting right atrial pressure of 3 mmHg.   Patient Profile     81 y.o. 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 During that admission felt to be volume overloaded but management difficult due to hypotension requiring midodrine as well as hyponatremia, hypokalemia, hypoalbuminemia, with moderate ascites.   Assessment & Plan    1. Worsening cavitary pneumonia, low-normal O2 sats at times (93-96%) - 6.8 x 5.0 cm cavitating lesion is noted in right upper lobe which is significantly increased in size compared to prior exam and most consistent with cavitating pneumonia or pulmonary abscess - increasing size despite 14 days of infection - pulm plans  bronchoscopy/BAL but has since developed arrhythmia so pulm notes indicate they want to see HR better controlled before proceeding  2. Acute on chronic HFmrEF with chronic bilateral lower extremity edema, confounded by cirrhosis with ascities, severe hypoalbuminemia 1.5 - 2D echo 09/19/21 EF 45-50%, G1DD, global HK, mild elevated PASP, mild BAE, moderate TR - EF not significantly changed from 2021 - IM decreased IV Lasix to daily; can continue current dose for now, once crt bump transition to home oral torsemide 40 mg daily - severely low albumin likely contributing to presentation and impressive edema - it is rarely the case that we can resolve edema from a patient with albumin this low - will defer mgmt of low albumin to primary team  3.  Sinus tachycardia/AT, PACs: no signs of afib/flutter. In the setting of a cavitary lung lesion -continue metop tartrate 25 mg BID  4. CAD s/p MI, PCI, CABG 1998, patent grafts 2010 - no angina - remains on ASA - no statin due to liver disease  5. Essential HTN - current rx: spironolactone 41m daily, IV Lasix decreased to 812m9/22, started on Lopressor 2519mID today, requires midodrine - BP OK, follow in context above  6. CKD stage 2 with electrolyte disturbance -stable  7.  RUE superficial vein thrombosis involving the right cephalic vein - per IM  Remainder of medical issues per IM. Concerned about his overall prognosis given the multiple medical issues at hand. We can follow peripherally. See recommendations above for guidance  For questions or updates, please contact [place] consult [email or web address] for contact info under Cardiology/STEMI.  Signed, BraJanina MayoD 10/05/2021, 11:59 AM

## 2021-10-06 DIAGNOSIS — J189 Pneumonia, unspecified organism: Secondary | ICD-10-CM | POA: Diagnosis not present

## 2021-10-06 DIAGNOSIS — E871 Hypo-osmolality and hyponatremia: Secondary | ICD-10-CM | POA: Diagnosis not present

## 2021-10-06 DIAGNOSIS — I2510 Atherosclerotic heart disease of native coronary artery without angina pectoris: Secondary | ICD-10-CM | POA: Diagnosis not present

## 2021-10-06 DIAGNOSIS — I5023 Acute on chronic systolic (congestive) heart failure: Secondary | ICD-10-CM | POA: Diagnosis not present

## 2021-10-06 LAB — CBC
HCT: 33.8 % — ABNORMAL LOW (ref 39.0–52.0)
Hemoglobin: 11.6 g/dL — ABNORMAL LOW (ref 13.0–17.0)
MCH: 33 pg (ref 26.0–34.0)
MCHC: 34.3 g/dL (ref 30.0–36.0)
MCV: 96.3 fL (ref 80.0–100.0)
Platelets: 146 10*3/uL — ABNORMAL LOW (ref 150–400)
RBC: 3.51 MIL/uL — ABNORMAL LOW (ref 4.22–5.81)
RDW: 16.4 % — ABNORMAL HIGH (ref 11.5–15.5)
WBC: 10.3 10*3/uL (ref 4.0–10.5)
nRBC: 0 % (ref 0.0–0.2)

## 2021-10-06 LAB — BASIC METABOLIC PANEL
Anion gap: 11 (ref 5–15)
BUN: 24 mg/dL — ABNORMAL HIGH (ref 8–23)
CO2: 27 mmol/L (ref 22–32)
Calcium: 8 mg/dL — ABNORMAL LOW (ref 8.9–10.3)
Chloride: 92 mmol/L — ABNORMAL LOW (ref 98–111)
Creatinine, Ser: 1.24 mg/dL (ref 0.61–1.24)
GFR, Estimated: 58 mL/min — ABNORMAL LOW (ref >60)
Glucose, Bld: 81 mg/dL (ref 70–99)
Potassium: 3.4 mmol/L — ABNORMAL LOW (ref 3.5–5.1)
Sodium: 130 mmol/L — ABNORMAL LOW (ref 135–145)

## 2021-10-06 MED ORDER — ENOXAPARIN SODIUM 40 MG/0.4ML IJ SOSY
40.0000 mg | PREFILLED_SYRINGE | INTRAMUSCULAR | Status: DC
  Administered 2021-10-06 – 2021-10-09 (×4): 40 mg via SUBCUTANEOUS
  Filled 2021-10-06 (×4): qty 0.4

## 2021-10-06 MED ORDER — POTASSIUM CHLORIDE CRYS ER 20 MEQ PO TBCR
40.0000 meq | EXTENDED_RELEASE_TABLET | ORAL | Status: DC
  Filled 2021-10-06: qty 2

## 2021-10-06 MED ORDER — POTASSIUM CHLORIDE 20 MEQ PO PACK
40.0000 meq | PACK | ORAL | Status: AC
  Administered 2021-10-06 (×2): 40 meq via ORAL
  Filled 2021-10-06 (×2): qty 2

## 2021-10-06 NOTE — Progress Notes (Signed)
NPO MN for bronch tomorrow 9AM  Erskine Emery MD PCCM

## 2021-10-06 NOTE — Progress Notes (Addendum)
Progress Note   Patient: Joseph Bryan GLO:[id number] DOB: 1940-07-19 DOA: 09/30/2021     6 DOS: the patient was seen and examined on 10/06/2021   Brief hospital course: Mr. Luft was admitted to the hospital with the working diagnosis of decompensated heart failure in the setting of worsening right upper lobe cavitary pneumonia and severe protein calorie malnutrition.   81 yo male with the past medical history of coronary artery disease, heart failure, non alcoholic liver cirrhosis, hypertension and CKD who presented with lower extremity edema. Recent hospitalization for heart failure 09/06 to 09/09 treated for heart failure and cavitary pneumonia. On his initial physical examination his blood pressure was 114/55, HR 93, RR 22 and 02 saturation 97%, lungs with no wheezing or rales, heart with S1 and S2 present and rhythmic, abdomen not distended and positive lower extremity edema.   Na 129, K 4,2 CL 90 bicarbonate 28 glucose 119 bun 17 cr 1,18  BNP 125  Wbc 14,2 hgb 12,1 plt 214   Ct chest with bilateral ground glass opacities, mild paraseptal emphysema and bronchiectasis. Positive right upper lobe 6.8 x 5.0 cm increased size cavitation pneumonia / pulmonary abscess.   EKG 125 bpm, left axis deviation, right bundle branch block, sinus with no significant ST segment or T wave changes, noisy baseline.   Patient has been placed on furosemide for diuresis.  Pulmonary consulted for worsening cavitary lesion despite antibiotic therapy.   09/21 patient is diuresing well but continue volume overloaded. Plan for diagnostic bronchoscopy tomorrow.  09/22 patient with tachycardia in the 120 to 130 bpm, cancelled bronchoscopy for today and will be rescheduled for Monday.  09/23 heart rate has improved with metoprolol.  09/24 volume has improved, plan for bronchoscopy tomorrow.   Assessment and Plan: * Acute on chronic systolic CHF (congestive heart failure) (HCC) Echocardiogram with reduced LV  systolic function with EF 45 to 50%, global hypokinesis. Preserved RV systolic function, with moderate TR.    Urine output is 188 ml  Systolic blood pressure has been 112 to 114 mmHg.   Continue midodrine for blood pressure support. Diuresis with empagliflozin and spironolactone, hold on loop diuretic for now.  Patient has Unna boots.  Atrial tachycardia has improved with metoprolol 25 mg po bid.   Right upper  extremity with age indeterminate superficial vein thrombosis involving the right cephalic vein.    Cavitary pneumonia Patient with right upper lobe cavitary pneumonia, Lesion size has increased despite antibiotic therapy for the last 14 days.  Patient has been afebrile and cultures with no growth. Wbc is 10.3 Oxymetry is 93% on room air.    Antibiotic therapy with IV Vancomycin and Unasyn.  Plan for further work up with bronchoscopy on Monday.   Hyponatremia Hypervolemic hyponatremia, hypomagnesemia   Continue to improve volume status Renal function with serum cr at 1,24 with K at 3,4 and serum bicarbonate at 27, Na 130.  Plan to add 40 meq Kcl x2 doses Follow up renal function in am, avoid hypotension and nephrotoxic medications.    Diuretic therapy with furosemide, spironolactone and empagliflozin.   CAD- s/p CABG in 1998 - patent grafts on cath in 2010 No chest pain  HTN (hypertension) Continue midodrine for blood pressure support.   Class 1 obesity Calculated BMI is 33,6  Patient with anasarca suspect calorie protein malnutrition, will follow up with nutrition recommendations.   Protein-calorie malnutrition, severe Continue with nutritional supplements         Subjective: Patient with no chest  pain, dyspnea continue to improve along with edema, continue to be very weak and deconditioned   Physical Exam: Vitals:   10/05/21 1934 10/06/21 0037 10/06/21 0359 10/06/21 0905  BP: (!) 104/59  112/67 (!) 114/54  Pulse: 73  79   Resp: 17  (!) 21 15   Temp: 98.1 F (36.7 C)  98.2 F (36.8 C)   TempSrc: Oral  Oral   SpO2: 95%  93%   Weight:  115.3 kg    Height:       Neurology awake and alert ENT with mild pallor Cardiovascular with S1 and S2 present and rhythmic with no gallops Respiratory with no rales or wheezing on anterior auscultation  Abdomen with no distention  Positive lower extremity edema +  Unna boots in place,  Data Reviewed:    Family Communication: I spoke with patient's wife at the bedside, we talked in detail about patient's condition, plan of care and prognosis and all questions were addressed.   Disposition: Status is: Inpatient Remains inpatient appropriate because: pending bronchoscopy   Planned Discharge Destination: Skilled nursing facility    Author: Tawni Millers, MD 10/06/2021 10:18 AM  For on call review [email or web address].

## 2021-10-06 NOTE — TOC Progression Note (Signed)
Transition of Care [facility]) - Progression Note    Patient Details  Name: Joseph Bryan MRN: [id number] Date of Birth: 06-16-1940  Transition of Care [facility]) CM/SW [place], LCSW Phone Number: 10/06/2021, 10:58 AM  Clinical Narrative:     CSW spoke with pt's wife who was in the room about bed offers. Pt was asleep. Pt's wife explained that their first choice was [facility]. CSW confirmed that pt had a bed offer from [facility]. The bed offer for [facility] was accepted. Pt's DC is unknown at this time.  TOC team will continue to assist with discharge planning needs.   Expected Discharge Plan: Skilled Nursing Facility Barriers to Discharge: Continued Medical Work up  Expected Discharge Plan and Services Expected Discharge Plan: [place]                                               Social Determinants of Health (SDOH) Interventions    Readmission Risk Interventions     No data to display

## 2021-10-07 ENCOUNTER — Inpatient Hospital Stay (HOSPITAL_COMMUNITY): Payer: Medicare Other

## 2021-10-07 ENCOUNTER — Encounter (HOSPITAL_COMMUNITY): Payer: Self-pay | Admitting: Internal Medicine

## 2021-10-07 ENCOUNTER — Inpatient Hospital Stay (HOSPITAL_COMMUNITY): Payer: Medicare Other | Admitting: Anesthesiology

## 2021-10-07 ENCOUNTER — Encounter (HOSPITAL_COMMUNITY)
Admission: EM | Disposition: A | Discharge status: Hospice | Payer: Self-pay | Source: Home / Self Care | Attending: Internal Medicine

## 2021-10-07 DIAGNOSIS — I110 Hypertensive heart disease with heart failure: Secondary | ICD-10-CM | POA: Diagnosis not present

## 2021-10-07 DIAGNOSIS — I509 Heart failure, unspecified: Secondary | ICD-10-CM

## 2021-10-07 DIAGNOSIS — Z87891 Personal history of nicotine dependence: Secondary | ICD-10-CM

## 2021-10-07 DIAGNOSIS — J850 Gangrene and necrosis of lung: Secondary | ICD-10-CM

## 2021-10-07 DIAGNOSIS — I2510 Atherosclerotic heart disease of native coronary artery without angina pectoris: Secondary | ICD-10-CM

## 2021-10-07 DIAGNOSIS — I5023 Acute on chronic systolic (congestive) heart failure: Secondary | ICD-10-CM | POA: Diagnosis not present

## 2021-10-07 DIAGNOSIS — J984 Other disorders of lung: Secondary | ICD-10-CM | POA: Diagnosis not present

## 2021-10-07 DIAGNOSIS — E871 Hypo-osmolality and hyponatremia: Secondary | ICD-10-CM | POA: Diagnosis not present

## 2021-10-07 DIAGNOSIS — J189 Pneumonia, unspecified organism: Secondary | ICD-10-CM | POA: Diagnosis not present

## 2021-10-07 HISTORY — PX: BRONCHIAL BRUSHINGS: SHX5108

## 2021-10-07 HISTORY — PX: VIDEO BRONCHOSCOPY: SHX5072

## 2021-10-07 HISTORY — PX: BRONCHIAL WASHINGS: SHX5105

## 2021-10-07 LAB — CBC
HCT: 33.8 % — ABNORMAL LOW (ref 39.0–52.0)
Hemoglobin: 11.7 g/dL — ABNORMAL LOW (ref 13.0–17.0)
MCH: 33.2 pg (ref 26.0–34.0)
MCHC: 34.6 g/dL (ref 30.0–36.0)
MCV: 96 fL (ref 80.0–100.0)
Platelets: 179 10*3/uL (ref 150–400)
RBC: 3.52 MIL/uL — ABNORMAL LOW (ref 4.22–5.81)
RDW: 16.5 % — ABNORMAL HIGH (ref 11.5–15.5)
WBC: 10.7 10*3/uL — ABNORMAL HIGH (ref 4.0–10.5)
nRBC: 0 % (ref 0.0–0.2)

## 2021-10-07 LAB — BASIC METABOLIC PANEL
Anion gap: 9 (ref 5–15)
BUN: 29 mg/dL — ABNORMAL HIGH (ref 8–23)
CO2: 27 mmol/L (ref 22–32)
Calcium: 8.1 mg/dL — ABNORMAL LOW (ref 8.9–10.3)
Chloride: 94 mmol/L — ABNORMAL LOW (ref 98–111)
Creatinine, Ser: 1.34 mg/dL — ABNORMAL HIGH (ref 0.61–1.24)
GFR, Estimated: 53 mL/min — ABNORMAL LOW (ref >60)
Glucose, Bld: 82 mg/dL (ref 70–99)
Potassium: 3.7 mmol/L (ref 3.5–5.1)
Sodium: 130 mmol/L — ABNORMAL LOW (ref 135–145)

## 2021-10-07 LAB — SARS CORONAVIRUS 2 BY RT PCR: SARS Coronavirus 2 by RT PCR: NEGATIVE

## 2021-10-07 SURGERY — VIDEO BRONCHOSCOPY WITHOUT FLUORO
Anesthesia: General | Laterality: Right

## 2021-10-07 MED ORDER — FENTANYL CITRATE (PF) 100 MCG/2ML IJ SOLN
INTRAMUSCULAR | Status: AC
  Filled 2021-10-07: qty 2

## 2021-10-07 MED ORDER — POTASSIUM CHLORIDE CRYS ER 20 MEQ PO TBCR
40.0000 meq | EXTENDED_RELEASE_TABLET | Freq: Once | ORAL | Status: AC
  Administered 2021-10-07: 40 meq via ORAL
  Filled 2021-10-07: qty 2

## 2021-10-07 MED ORDER — ONDANSETRON HCL 4 MG/2ML IJ SOLN
INTRAMUSCULAR | Status: DC | PRN
  Administered 2021-10-07: 4 mg via INTRAVENOUS

## 2021-10-07 MED ORDER — LIDOCAINE 2% (20 MG/ML) 5 ML SYRINGE
INTRAMUSCULAR | Status: DC | PRN
  Administered 2021-10-07: 60 mg via INTRAVENOUS

## 2021-10-07 MED ORDER — PROPOFOL 500 MG/50ML IV EMUL
INTRAVENOUS | Status: DC | PRN
  Administered 2021-10-07: 100 ug/kg/min via INTRAVENOUS

## 2021-10-07 MED ORDER — LACTATED RINGERS IV SOLN
INTRAVENOUS | Status: AC | PRN
  Administered 2021-10-07: 1000 mL via INTRAVENOUS

## 2021-10-07 MED ORDER — LACTATED RINGERS IV SOLN: INTRAVENOUS | Status: DC | PRN

## 2021-10-07 MED ORDER — ROCURONIUM BROMIDE 10 MG/ML (PF) SYRINGE
PREFILLED_SYRINGE | INTRAVENOUS | Status: DC | PRN
  Administered 2021-10-07: 20 mg via INTRAVENOUS

## 2021-10-07 MED ORDER — PHENYLEPHRINE 80 MCG/ML (10ML) SYRINGE FOR IV PUSH (FOR BLOOD PRESSURE SUPPORT)
PREFILLED_SYRINGE | INTRAVENOUS | Status: DC | PRN
  Administered 2021-10-07 (×2): 80 ug via INTRAVENOUS

## 2021-10-07 MED ORDER — DEXAMETHASONE SODIUM PHOSPHATE 10 MG/ML IJ SOLN
INTRAMUSCULAR | Status: DC | PRN
  Administered 2021-10-07: 10 mg via INTRAVENOUS

## 2021-10-07 MED ORDER — FUROSEMIDE 10 MG/ML IJ SOLN
60.0000 mg | Freq: Two times a day (BID) | INTRAMUSCULAR | Status: DC
  Administered 2021-10-07 – 2021-10-08 (×3): 60 mg via INTRAVENOUS
  Filled 2021-10-07 (×3): qty 6

## 2021-10-07 MED ORDER — ETOMIDATE 2 MG/ML IV SOLN
INTRAVENOUS | Status: DC | PRN
  Administered 2021-10-07: 16 mg via INTRAVENOUS

## 2021-10-07 MED ORDER — SUCCINYLCHOLINE CHLORIDE 200 MG/10ML IV SOSY
PREFILLED_SYRINGE | INTRAVENOUS | Status: DC | PRN
  Administered 2021-10-07: 120 mg via INTRAVENOUS

## 2021-10-07 MED ORDER — SUGAMMADEX SODIUM 200 MG/2ML IV SOLN
INTRAVENOUS | Status: DC | PRN
  Administered 2021-10-07: 200 mg via INTRAVENOUS

## 2021-10-07 MED ORDER — PHENYLEPHRINE HCL-NACL 20-0.9 MG/250ML-% IV SOLN
INTRAVENOUS | Status: DC | PRN
  Administered 2021-10-07: 30 ug/min via INTRAVENOUS

## 2021-10-07 NOTE — Progress Notes (Signed)
Orthopedic Tech Progress Note Patient Details:  Joseph Bryan 08/11/1940 [id number]  Ortho Devices Type of Ortho Device: Haematologist Ortho Device/Splint Location: BLE Ortho Device/Splint Interventions: Application   Post Interventions Patient Tolerated: Well  Chip Boer 10/07/2021, 4:29 PM

## 2021-10-07 NOTE — Progress Notes (Signed)
Patient had epistaxis in the left nostril on and off during the night.

## 2021-10-07 NOTE — Anesthesia Postprocedure Evaluation (Signed)
Anesthesia Post Note  Patient: Joseph Bryan  Procedure(s) Performed: VIDEO BRONCHOSCOPY WITHOUT FLUORO (Right) BRONCHIAL BRUSHINGS BRONCHIAL WASHINGS     Patient location during evaluation: PACU Anesthesia Type: General Level of consciousness: awake and alert Pain management: pain level controlled Vital Signs Assessment: post-procedure vital signs reviewed and stable Respiratory status: spontaneous breathing, nonlabored ventilation, respiratory function stable and patient connected to nasal cannula oxygen Cardiovascular status: blood pressure returned to baseline and stable Postop Assessment: no apparent nausea or vomiting Anesthetic complications: no   No notable events documented.  Last Vitals:  Vitals:   10/07/21 1215 10/07/21 1240  BP: (!) 112/53 (!) 113/51  Pulse: 83 80  Resp: 17 17  Temp: 36.5 C 36.6 C  SpO2: 91% 100%    Last Pain:  Vitals:   10/07/21 1240  TempSrc: Oral  PainSc:                  [name]

## 2021-10-07 NOTE — Progress Notes (Signed)
SLP Cancellation Note  Patient Details Name: Joseph Bryan MRN: [id number] DOB: 07/14/1940   Cancelled treatment:       Reason Eval/Treat Not Completed: Patient at procedure or test/unavailable  Unable to complete BSE at this time, as pt is currently off unit for Endoscopy. Will continue efforts.  Mckyla Deckman B. Quentin Ore, [facility], [place] Speech Language Pathologist Office: [phone]  Shonna Chock 10/07/2021, 10:38 AM

## 2021-10-07 NOTE — Progress Notes (Signed)
Progress Note   Patient: Joseph Bryan DOB: 1940-10-08 DOA: 09/30/2021     7 DOS: the patient was seen and examined on 10/07/2021   Brief hospital course: [name] was admitted to the hospital with the working diagnosis of decompensated heart failure in the setting of worsening right upper lobe cavitary pneumonia and severe protein calorie malnutrition.   81 yo male with the past medical history of coronary artery disease, heart failure, non alcoholic liver cirrhosis, hypertension and CKD who presented with lower extremity edema. Recent hospitalization for heart failure 09/06 to 09/09 treated for heart failure and cavitary pneumonia. On his initial physical examination his blood pressure was 114/55, HR 93, RR 22 and 02 saturation 97%, lungs with no wheezing or rales, heart with S1 and S2 present and rhythmic, abdomen not distended and positive lower extremity edema.   Na 129, K 4,2 CL 90 bicarbonate 28 glucose 119 bun 17 cr 1,18  BNP 125  Wbc 14,2 hgb 12,1 plt 214   Ct chest with bilateral ground glass opacities, mild paraseptal emphysema and bronchiectasis. Positive right upper lobe 6.8 x 5.0 cm increased size cavitation pneumonia / pulmonary abscess.   EKG 125 bpm, left axis deviation, right bundle branch block, sinus with no significant ST segment or T wave changes, noisy baseline.   Patient has been placed on furosemide for diuresis.  Pulmonary consulted for worsening cavitary lesion despite antibiotic therapy.   09/21 patient is diuresing well but continue volume overloaded. Plan for diagnostic bronchoscopy tomorrow.  09/22 patient with tachycardia in the 120 to 130 bpm, cancelled bronchoscopy for today and will be rescheduled for Monday.  09/23 heart rate has improved with metoprolol.  09/24 volume has improved,  09/25 bronchoscopy.   Assessment and Plan: * Acute on chronic systolic CHF (congestive heart failure) (HCC) Echocardiogram with reduced LV systolic  function with EF 45 to 50%, global hypokinesis. Preserved RV systolic function, with moderate TR.    Urine output is 1,660 ml  Systolic blood pressure has been 106 to 113 mmHg.   Continue midodrine for blood pressure support. Diuresis with empagliflozin and spironolactone, Resume loop diuretic with furosemide 60 mg IV Patient has Unna boots.  Atrial tachycardia has improved with metoprolol 25 mg po bid.   Right upper  extremity with age indeterminate superficial vein thrombosis involving the right cephalic vein.    Cavitary pneumonia Patient with right upper lobe cavitary pneumonia, Lesion size has increased despite antibiotic therapy for the last 14 days.  Patient has been afebrile and cultures with no growth. Wbc is 10.7 Blood cultures have been no growth.  Oxymetry is 93% on room air.    Antibiotic therapy with IV Vancomycin and Unasyn.  Follow up on report from bronchoscopy.   Hyponatremia Hypervolemic hyponatremia, hypomagnesemia   Renal function with serum cr at 1,34 with K at 3,7 and serum bicarbonate at 27. Na 130,   Plan to resume diuresis with furosemide 60 mg IV q12 Follow up renal function and electrolytes in am Add 40 meq Kcl today.   Continue with furosemide, spironolactone and empagliflozin.   CAD- s/p CABG in 1998 - patent grafts on cath in 2010 No chest pain  HTN (hypertension) Continue midodrine for blood pressure support.   Class 1 obesity Calculated BMI is 33,6  Patient with anasarca suspect calorie protein malnutrition, will follow up with nutrition recommendations.   Protein-calorie malnutrition, severe Continue with nutritional supplements         Subjective: patient with  no chest pain, had bronchoscopy with good toleration   Physical Exam: Vitals:   10/07/21 1145 10/07/21 1200 10/07/21 1215 10/07/21 1240  BP: 106/63 (!) 112/54 (!) 112/53 (!) 113/51  Pulse: 88 85 83 80  Resp: '16 19 17 17  '$ Temp:   97.7 F (36.5 C) 97.9 F (36.6  C)  TempSrc:    Oral  SpO2: 90% 100% 91% 100%  Weight:      Height:       Neurology awake and alert ENT with mild pallor Cardiovascular with S1 and S2 present and rhythmic with no gallops Positive JVD Pitting lower extremity edema up to the hips Respiratory with bilateral expiratory wheezing Abdomen with no distention but protuberant  Data Reviewed:    Family Communication: I spoke with patient's wife at the bedside, we talked in detail about patient's condition, plan of care and prognosis and all questions were addressed.    Disposition: Status is: Inpatient Remains inpatient appropriate because: IV antibiotic therapy and diuresis.   Planned Discharge Destination: Home    Author: Tawni Millers, MD 10/07/2021 2:26 PM  For on call review [email or web address].

## 2021-10-07 NOTE — Transfer of Care (Signed)
Immediate Anesthesia Transfer of Care Note  Patient: Joseph Bryan  Procedure(s) Performed: VIDEO BRONCHOSCOPY WITHOUT FLUORO (Right) BRONCHIAL BRUSHINGS BRONCHIAL WASHINGS  Patient Location: PACU  Anesthesia Type:General  Level of Consciousness: awake and alert   Airway & Oxygen Therapy: Patient Spontanous Breathing and Patient connected to face mask oxygen  Post-op Assessment: Report given to RN and Post -op Vital signs reviewed and stable  Post vital signs: Reviewed and stable  Last Vitals:  Vitals Value Taken Time  BP 100/64 10/07/21 1130  Temp 36.3 C 10/07/21 1130  Pulse 89 10/07/21 1135  Resp 23 10/07/21 1135  SpO2 93 % 10/07/21 1135  Vitals shown include unvalidated device data.  Last Pain:  Vitals:   10/07/21 0920  TempSrc: Temporal  PainSc: 0-No pain         Complications: No notable events documented.

## 2021-10-07 NOTE — TOC Progression Note (Addendum)
Transition of Care [facility]) - Initial/Assessment Note    Patient Details  Name: Joseph Bryan MRN: [id number] Date of Birth: 1940-08-12  Transition of Care [facility]) CM/SW Contact:    Milinda Antis, [place] Phone Number: 10/07/2021, 12:43 PM  Clinical Narrative:                 CSW contacted admissions at [facility] to inform the facility that the patient has accepted the bed offer.  The facility confirmed that there is still a bed available for the patient.   TOC will continue to follow.   Expected Discharge Plan: Skilled Nursing Facility Barriers to Discharge: Continued Medical Work up   Patient Goals and CMS Choice        Expected Discharge Plan and Services Expected Discharge Plan: [place]                                              Prior Living Arrangements/Services     Patient language and need for interpreter reviewed:: Yes        Need for Family Participation in Patient Care: No (Comment) Care giver support system in place?: Yes (comment)   Criminal Activity/Legal Involvement Pertinent to Current Situation/Hospitalization: No - Comment as needed  Activities of Daily Living      Permission Sought/Granted                  Emotional Assessment Appearance:: Appears stated age Attitude/Demeanor/Rapport: Engaged Affect (typically observed): Accepting Orientation: : Oriented to Self, Oriented to Place, Oriented to  Time, Oriented to Situation Alcohol / Substance Use: Not Applicable Psych Involvement: No (comment)  Admission diagnosis:  Hyponatremia [E87.1] Peripheral edema [R60.9] CHF exacerbation ([place]) [I50.9] Cavitary pneumonia [J18.9, J98.4] Patient Active Problem List   Diagnosis Date Noted   Atrial tachycardia ([place])    Class 1 obesity 10/02/2021   Protein-calorie malnutrition, severe 10/02/2021   Peripheral edema    Acute on chronic systolic CHF (congestive heart failure) ([place]) 09/30/2021   Lower extremity edema  09/19/2021   HFrEF (heart failure with reduced ejection fraction) ([place]) 09/19/2021   Hypotension 09/18/2021   Hyponatremia 09/18/2021   AKI (acute kidney injury) ([place]) 09/18/2021   Thrombocytopenia ([place]) 09/18/2021   Cavitary pneumonia 09/18/2021   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 06/21/2019   Cholelithiasis with chronic cholecystitis 06/11/2019   Elevated liver enzymes 06/11/2019   Lumbar spinal stenosis 01/16/2017   Hyperlipidemia LDL goal <70 04/30/2016   Elevated LFTs 04/30/2016   Spinal stenosis, lumbar region, with neurogenic claudication 10/19/2015   Leg edema 10/08/2012   OSA on CPAP 10/08/2012   RBBB 10/08/2012   History of MI (myocardial infarction) 10/08/2012   Anasarca 09/01/2012   Abdominal pain, acute, right lower quadrant 08/26/2012   HTN (hypertension) 08/26/2012   Obesity, unspecified 08/26/2012   CAD- s/p CABG in 1998 - patent grafts on cath in 2010 08/26/2012   PCP:  Leonard Downing, MD Pharmacy:   [place] [place] [address] Phone: [phone] Fax: [phone]  [facility] ([place], [place] [facility] [address] [place] [address] Phone: [phone] Fax: [phone]  [facility] ([facility]) - [place] [place] [address] Phone: [phone] Fax: [place] [address], [place] [address] [place] [address] Phone: [phone] Fax: [phone]     Social Determinants of Health (SDOH) Interventions    Readmission Risk Interventions     No data to display

## 2021-10-07 NOTE — Progress Notes (Signed)
Heart Failure Navigator Progress Note  Following this hospitalization to assess for HV TOC readiness.   Bronchoscopy scheduled for 10/07/21 EF 45-50%  ? SNF at discharge  Earnestine Leys, BSN, RN Heart Failure Nurse Navigator Secure Chat Only

## 2021-10-07 NOTE — Progress Notes (Signed)
Una boots removed and ready for ortho techs.

## 2021-10-07 NOTE — Progress Notes (Signed)
NAME:  Joseph Bryan, MRN:  [id number], DOB:  December 07, 1940, LOS: 7 ADMISSION DATE:  09/30/2021, CONSULTATION DATE: 9/20 REFERRING MD: Dr. 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  He was recently admitted from 9/6-9/9 and treated for decompensated heart failure with IV diuresis.  There was some concern during that admission for pulmonary embolism and CT angiogram of the chest was done revealing right upper lobe cavitary pneumonia.  The patient had no infectious complaints and no clinical signs of pneumonia.  He was evaluated by pulmonary at that time and was recommended for 1 month of Augmentin and pulmonary follow-up at that time for additional imaging.  Unfortunately the patient again presents to [facility] emergency department on 9/18 with complaints of lower extremity edema and generalized weakness.  The patient and his wife report good compliance with medical regimen including diuretics and antibiotics, however, lower extremity edema did worsen.  He was admitted to the hospitalist service for CHF treatment and cardiology was consulted.  CTA was repeated and was again negative for PE, however, showed an increase in size of the right upper lobe cavitary lesion despite antibiotics.  PCCM consulted for further recommendations.  The patient again denies infectious symptoms such as fever, chills, and productive cough.  Pertinent  Medical History   has a past medical history of Cancer ([place]), CHF exacerbation ([place]) (09/30/2021), Chronic kidney disease, Coronary artery disease, Glaucoma, History of kidney stones, Hyperlipidemia, Hypertension, Neuromuscular disorder ([place]), Neuropathy, Pneumonia, Renal insufficiency, and Sleep apnea with use of continuous positive airway  pressure (CPAP).   Significant Hospital Events: Including procedures, antibiotic start and stop dates in addition to other pertinent events   9/18 admit for CHF exacerbation. Cavitary lesion worse on CT.  9/22 bronchoscopy canceled due to AF/RVR?  Atrial flutter/tachycardia >> metoprolol  Interim History / Subjective:   RN reports epistaxis overnight. He offers no complaints, no chest pain or dyspnea Heart rate 80s, sinus on monitor  Objective   Blood pressure (!) 101/55, pulse 81, temperature 98.2 F (36.8 C), temperature source Oral, resp. rate 17, height _0  (1.854 m), weight 115.8 kg, SpO2 91 %.        Intake/Output Summary (Last 24 hours) at 10/07/2021 2956 Last data filed at 10/07/2021 0314 Gross per 24 hour  Intake 240 ml  Output 1250 ml  Net -1010 ml    Filed Weights   10/05/21 0144 10/06/21 0037 10/07/21 0310  Weight: 115.8 kg 115.3 kg 115.8 kg    Examination: Chronically ill man lying in bed MM dry S1-S2 regular, no murmur Decreased breath sounds bilateral, no rhonchi Soft, nontender abdomen Legs wrapped ongoing anasarca Moves all 4 ext to command but weak, alert, interactive  Labs show hyponatremia, mild hypokalemia, no leukocytosis, stable anemia Reviewed CT imaging  Resolved Hospital Problem list     Assessment & Plan:   Right upper lobe cavitary lesion: size increasing despite 14 days augmentin. Characteristics of growth argue for infection. -Agree with empiric antibiotics as recommended by ID (Unasyn, Vanco) -f/u sputum culture -Plan for bronchoscopy with BAL and brushings of right upper lobe Risks and benefits of procedure discussed with patient and his wife at bedside Await COVID testing per anesthesia protocol  Acute on chronic HFrEF : EF 45 to 50% Atrial fibrillation CAD -Management per cardiology   Process  with ascites/hypoalbuminemia/anasarca -Per primary  Joseph Mead MD. FCCP. [place] [facility] Pager : [phone]  If no response to pager , please call [phone] until 7 pm After 7:00 pm call [place]  [phone]   10/07/2021

## 2021-10-07 NOTE — Evaluation (Signed)
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   Coronary artery disease    Glaucoma    History of kidney stones    Hyperlipidemia    Hypertension    Neuromuscular disorder (HCC)    neuropathy feet   Neuropathy    feet   Pneumonia    Renal insufficiency    Sleep apnea with use of continuous positive airway pressure (CPAP)    uses CPAP   Past Surgical History:  Past Surgical History:  Procedure Laterality Date   APPENDECTOMY  2014   CHOLECYSTECTOMY N/A 06/21/2019   Procedure: LAPAROSCOPIC CHOLECYSTECTOMY WITH INTRAOPERATIVE CHOLANGIOGRAM;  Surgeon: Armandina Gemma, MD;  Location: [facility];  Service: General;  Laterality: N/A;   COLON RESECTION N/A 08/27/2012   Procedure: Diagnostic laparoscopy; Ileocecectomy;  Surgeon: Madilyn Hook, DO;  Location: [facility];  Service: General;  Laterality: N/A;   COLONOSCOPY WITH PROPOFOL N/A 04/09/2015   Procedure: COLONOSCOPY WITH PROPOFOL;  Surgeon: Garlan Fair, MD;  Location: [facility];  Service: Endoscopy;  Laterality: N/A;   CORONARY ARTERY BYPASS GRAFT  1998   x5   CYSTOSCOPY/URETEROSCOPY/HOLMIUM LASER/STENT PLACEMENT Right 02/25/2018   Procedure: RIGHT URETEROSCOPY/HOLMIUM LASER/ STONE REMOVAL  STENT PLACEMENT;  Surgeon: Ardis Hughs, MD;  Location: [facility];  Service: Urology;  Laterality: Right;   ERCP N/A 06/22/2019   Procedure: ENDOSCOPIC RETROGRADE CHOLANGIOPANCREATOGRAPHY (ERCP);  Surgeon: Clarene Essex, MD;  Location: [facility];  Service: Endoscopy;  Laterality: N/A;   EYE SURGERY     cataract and glaucome    LUMBAR LAMINECTOMY/DECOMPRESSION MICRODISCECTOMY N/A 10/19/2015   Procedure: Lumbar three to four LAMINECTOMY/FORAMINOTOMY;  Surgeon: Erline Levine, MD;  Location: [facility];  Service: Neurosurgery;  Laterality: N/A;   LUNG SURGERY  yrs ago   right and 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 PMH: HFrEF, HTN, CAD s/p CABG (1998), patent grafts on cath (2010), CKD, right bundle branch bloclk, HLD, OSA on CPAP, cancer (appendix), neuropathy, PNA, non-alcoholic liver cirrhosis    Assessment / Plan / Recommendation  Clinical Impression  Pt seen at bedside for evaluation of swallow function and safety. Pt was awake and alert, pleasant and cooperative. Wife at bedside. Pt has dentures that are ill-fitting (likely due to weight loss). Denture adhesive effective. CN exam WFL. Pt accepted trials of ice chips, thin liquid, puree, and solid textures. Very slow oral prep with cracker, however, no overt s/s aspiration on any consistency, even when challenged to take multiple large boluses of water. Suspect primary esophageal dysphagia based on pt's presentation, and report of difficulty with meats, breads, and large pills. Recommend ground (dys 2) diet with thin liquids, small pills whole with liquid or puree, large  pills cut or crushed if able. Also recommend consideration of a Regular Barium Swallow/esophagram to evaluate esophageal motility. SLP will follow up after results available to provide education. Pt/wife were provided with written behavioral and dietary strategies for management of esophageal dysmotility to review in the interim.  SLP Visit Diagnosis:  Dysphagia, unspecified (R13.10)    Aspiration Risk  Mild aspiration risk;Risk for inadequate nutrition/hydration    Diet Recommendation Dysphagia 2 (Fine chop);Thin liquid   Liquid Administration via: Cup;Straw Medication Administration:  (small pills whole, large pills cut or crushed.) Supervision: Patient able to self feed;Intermittent supervision to cue for compensatory strategies Compensations: Minimize environmental distractions;Slow rate;Small sips/bites;Follow solids with liquid Postural Changes: Seated upright at 90 degrees;Remain upright for at least 30 minutes after po intake    Other  Recommendations Recommended Consults: Consider esophageal assessment Oral Care Recommendations: Oral care BID    Recommendations for follow up therapy are one component of a multi-disciplinary discharge planning process, led by the attending physician.  Recommendations may be updated based on patient status, additional functional criteria and insurance authorization.  Follow up Recommendations No SLP follow up after education per [place] Recommended at Discharge None  Functional Status Assessment Patient has not had a recent decline in their functional status  Frequency and Duration min 1 x/week  1 week       Prognosis Prognosis for Safe Diet Advancement: Fair Barriers to Reach Goals: ill fitting dentures     Swallow Study   General Date of Onset: 09/30/21 HPI: 81yo male admitted 09/30/21 with weakness and BLE edema since DC from [facility]. PMH: HFrEF, HTN, CAD s/p CABG (1998), patent grafts on cath (2010), CKD, right bundle branch bloclk, HLD, OSA on CPAP, cancer (appendix), neuropathy, PNA, non-alcoholic liver cirrhosis Type of Study: Bedside Swallow Evaluation Previous Swallow Assessment: none found Diet Prior to this Study: NPO Temperature Spikes Noted: No Respiratory Status: Nasal cannula History of Recent Intubation: Yes Length of Intubations (days): 1 days (for  bronchoscopy) Date extubated: 10/07/21 Behavior/Cognition: Alert;Cooperative;Pleasant mood Oral Cavity Assessment: Within Functional Limits Oral Care Completed by SLP: Yes Oral Cavity - Dentition: Dentures, bottom;Dentures, top Vision: Functional for self-feeding Self-Feeding Abilities: Able to feed self;Needs assist;Needs set up Patient Positioning: Upright in bed Baseline Vocal Quality: Normal Volitional Cough: Weak;Congested Volitional Swallow: Able to elicit    Oral/Motor/Sensory Function Overall Oral Motor/Sensory Function: Within functional limits   Ice Chips Ice chips: Within functional limits Presentation: Spoon   Thin Liquid Thin Liquid: Within functional limits Presentation: Straw    Nectar Thick Nectar Thick Liquid: Not tested   Honey Thick Honey Thick Liquid: Not tested   Puree Puree: Within functional limits Presentation: Spoon   Solid     Solid: Impaired Oral Phase Functional Implications: Prolonged oral transit;Impaired mastication     Claritza July B. Quentin Ore, [facility], [place] Speech Language Pathologist Office: [phone]  Shonna Chock 10/07/2021,2:32 PM

## 2021-10-07 NOTE — Op Note (Signed)
Indication: RUL necrotizing/ cavitary  pneumonia in the 81 -year-old remote heavy smoker  Written informed consent was obtained from the patient prior to the procedure. The risks of the procedure including coughing, bleeding and a small chance of lung cancer requiring a chest tube were discussed with the patient in great detail and evidenced understanding.  Patient was intubated & general anesthesia provided.Bronchoscope was inserted from the ETT The upper airway appeared normal. Thick mucoid secretions suctioned from trachea. The trachea bronchial tree was then examined to the subsegmental level.  No endobronchial lesions were noted. BAL  & bruishings x 2 obtained from RUL , posterior sub segment   The patient tolerated procedure well with minimal bleeding.  A portable chest XR. will be performed to rule out presence of pneumothorax. He was  extubated & recovered by anesthesia.  Specimens - BAL for culture, afb, fungal, cytology Brushings for cytology   Joseph Alberta V. Elsworth Soho MD

## 2021-10-07 NOTE — Anesthesia Procedure Notes (Addendum)
Procedure Name: Intubation Date/Time: 10/07/2021 10:42 AM  Performed by: [name] Phenix, CRNAPre-anesthesia Checklist: Patient identified, Emergency Drugs available, Suction available and Patient being monitored Patient Re-evaluated:Patient Re-evaluated prior to induction Oxygen Delivery Method: Circle system utilized Preoxygenation: Pre-oxygenation with 100% oxygen Induction Type: IV induction Ventilation: Mask ventilation without difficulty and Oral airway inserted - appropriate to patient size Laryngoscope Size: Mac and 4 Grade View: Grade I Tube type: Oral Tube size: 8.5 mm Number of attempts: 1 Airway Equipment and Method: Stylet Placement Confirmation: ETT inserted through vocal cords under direct vision, positive ETCO2 and breath sounds checked- equal and bilateral Secured at: 24 cm Tube secured with: Tape Dental Injury: Teeth and Oropharynx as per pre-operative assessment

## 2021-10-08 ENCOUNTER — Encounter (HOSPITAL_COMMUNITY): Payer: Self-pay | Admitting: Pulmonary Disease

## 2021-10-08 DIAGNOSIS — E871 Hypo-osmolality and hyponatremia: Secondary | ICD-10-CM | POA: Diagnosis not present

## 2021-10-08 DIAGNOSIS — I2510 Atherosclerotic heart disease of native coronary artery without angina pectoris: Secondary | ICD-10-CM | POA: Diagnosis not present

## 2021-10-08 DIAGNOSIS — J984 Other disorders of lung: Secondary | ICD-10-CM | POA: Diagnosis not present

## 2021-10-08 DIAGNOSIS — I5023 Acute on chronic systolic (congestive) heart failure: Secondary | ICD-10-CM | POA: Diagnosis not present

## 2021-10-08 DIAGNOSIS — J189 Pneumonia, unspecified organism: Secondary | ICD-10-CM | POA: Diagnosis not present

## 2021-10-08 LAB — BASIC METABOLIC PANEL
Anion gap: 8 (ref 5–15)
BUN: 36 mg/dL — ABNORMAL HIGH (ref 8–23)
CO2: 27 mmol/L (ref 22–32)
Calcium: 8.3 mg/dL — ABNORMAL LOW (ref 8.9–10.3)
Chloride: 97 mmol/L — ABNORMAL LOW (ref 98–111)
Creatinine, Ser: 1.34 mg/dL — ABNORMAL HIGH (ref 0.61–1.24)
GFR, Estimated: 53 mL/min — ABNORMAL LOW (ref >60)
Glucose, Bld: 143 mg/dL — ABNORMAL HIGH (ref 70–99)
Potassium: 5 mmol/L (ref 3.5–5.1)
Sodium: 132 mmol/L — ABNORMAL LOW (ref 135–145)

## 2021-10-08 MED ORDER — FUROSEMIDE 10 MG/ML IJ SOLN
60.0000 mg | Freq: Two times a day (BID) | INTRAMUSCULAR | Status: DC
  Administered 2021-10-09: 60 mg via INTRAVENOUS
  Filled 2021-10-08: qty 6

## 2021-10-08 NOTE — Progress Notes (Signed)
Physical Therapy Treatment Patient Details Name: Joseph Bryan MRN: [id number] DOB: 1940-09-05 Today's Date: 10/08/2021   History of Present Illness 81 y.o. male presents to [facility] hospital on 09/30/2021 with weakness, BLE edema, and volume overload. Imaging concerned for cavitary PNA or pulmonary abscess. PMH includes cancer, CHF, CKD, CAD, HTN, HLD, PNA.    PT Comments    Pt agreeable to getting up with PT/OT and wife relays that pt wants to be more mobile. Pt needed mod A +2 for coming to sitting EOB due to difficulty moving LE's and elevating trunk into sitting. With first attempted sit>stand allowed pt to attempt without assist to stedy. Pt unable to come all the way up and could not maintain position. For pt to come fully up she required mod A +2. Worked on pregait at sink to advance ambulation. Pt tolerated 2 mins of standing at a time. Unable to lift feet in standing even with UE support. Pt very fatigued after standing trials. PT will continue to follow.    Recommendations for follow up therapy are one component of a multi-disciplinary discharge planning process, led by the attending physician.  Recommendations may be updated based on patient status, additional functional criteria and insurance authorization.  Follow Up Recommendations  Skilled nursing-short term rehab (<3 hours/day) Can patient physically be transported by private vehicle: No   Assistance Recommended at Discharge Intermittent Supervision/Assistance  Patient can return home with the following A lot of help with bathing/dressing/bathroom;Two people to help with walking and/or transfers;Assistance with cooking/housework;Assist for transportation;Help with stairs or ramp for entrance   Equipment Recommendations  BSC/3in1;Wheelchair (measurements PT)    Recommendations for Other Services       Precautions / Restrictions Precautions Precautions: Fall;Other (comment) Precaution Comments: Watch HR Restrictions Weight  Bearing Restrictions: No RLE Weight Bearing: Weight bearing as tolerated LLE Weight Bearing: Weight bearing as tolerated     Mobility  Bed Mobility Overal bed mobility: Needs Assistance Bed Mobility: Supine to Sit, Sit to Supine     Supine to sit: HOB elevated, Mod assist Sit to supine: Mod assist, +2 for physical assistance   General bed mobility comments: mod A needed for BLE's with coming to sitting as well as return to supine    Transfers Overall transfer level: Needs assistance Equipment used: Ambulation equipment used Transfers: Sit to/from Stand Sit to Stand: Mod assist, From elevated surface, +2 physical assistance           General transfer comment: pt needed mod A +2 for power up from elevated bed to stedy. Allowed to attempt standing first time without assist and he was unable to fully rise or maintain position. Achieved full standing with mod A +2. Pt able ot stand from flaps of stedy with min A 2x and mod A last time when fatigued Transfer via Lift Equipment: Stedy  Ambulation/Gait             Pre-gait activities: worked on increasing standing time to progress gait, pt tolerated 2 mins before needing to sit. Pt with increased pain below knees in standing     Stairs             Wheelchair Mobility    Modified Rankin (Stroke Patients Only)       Balance Overall balance assessment: Needs assistance Sitting-balance support: No upper extremity supported, Feet supported Sitting balance-Leahy Scale: Fair     Standing balance support: Bilateral upper extremity supported, Reliant on assistive device for balance Standing balance-Leahy Scale: Poor  Standing balance comment: needs UE and external support                            Cognition Arousal/Alertness: Awake/alert Behavior During Therapy: WFL for tasks assessed/performed Overall Cognitive Status: Impaired/Different from baseline Area of Impairment: Problem solving                              Problem Solving: Slow processing General Comments: needs extra time to process, decreased verbalization        Exercises      General Comments        Pertinent Vitals/Pain Pain Assessment Pain Assessment: Faces Faces Pain Scale: Hurts even more Pain Location: ankles and lower legs in standing Pain Descriptors / Indicators: Aching Pain Intervention(s): Limited activity within patient's tolerance, Monitored during session    Home Living                          Prior Function            PT Goals (current goals can now be found in the care plan section) Acute Rehab PT Goals Patient Stated Goal: to get stronger and reduce caregiver burden PT Goal Formulation: With patient Time For Goal Achievement: 10/17/21 Potential to Achieve Goals: Fair Progress towards PT goals: Progressing toward goals    Frequency    Min 2X/week      PT Plan Current plan remains appropriate    Co-evaluation PT/OT/SLP Co-Evaluation/Treatment: Yes Reason for Co-Treatment: Complexity of the patient's impairments (multi-system involvement);For patient/therapist safety PT goals addressed during session: Mobility/safety with mobility;Balance;Strengthening/ROM;Proper use of DME        AM-PAC PT "6 Clicks" Mobility   Outcome Measure  Help needed turning from your back to your side while in a flat bed without using bedrails?: A Little Help needed moving from lying on your back to sitting on the side of a flat bed without using bedrails?: A Little Help needed moving to and from a bed to a chair (including a wheelchair)?: A Lot Help needed standing up from a chair using your arms (e.g., wheelchair or bedside chair)?: A Lot Help needed to walk in hospital room?: Total Help needed climbing 3-5 steps with a railing? : Total 6 Click Score: 12    End of Session Equipment Utilized During Treatment: Gait belt Activity Tolerance: Patient tolerated treatment  well Patient left: in bed;with call bell/phone within reach;with bed alarm set;with family/visitor present Nurse Communication: Mobility status PT Visit Diagnosis: Other abnormalities of gait and mobility (R26.89);Difficulty in walking, not elsewhere classified (R26.2)     Time: 1020-1046 PT Time Calculation (min) (ACUTE ONLY): 26 min  Charges:  $Therapeutic Activity: 8-22 mins                     Leighton Roach, PT  Acute Rehab Services Secure chat preferred Office [place] 10/08/2021, 12:29 PM

## 2021-10-08 NOTE — Progress Notes (Signed)
Occupational Therapy Treatment Patient Details Name: Joseph Bryan MRN: [id number] DOB: April 22, 1940 Today's Date: 10/08/2021   History of present illness 81 y.o. male presents to [facility] hospital on 09/30/2021 with weakness, BLE edema, and volume overload. Imaging concerned for cavitary PNA or pulmonary abscess. PMH includes cancer, CHF, CKD, CAD, HTN, HLD, PNA.   OT comments  Patient received in supine and patient eager to participate in OT/PT session. Patient required mod assist +2 to get to EOB with assistance for BLEs and trunk.  Patietn attempted to stand in [place] with limited assistance and was unable to come to full stand. On second attempt patient able to come to full stand with mod assist +2.  Patient stood at sink for grooming tasks but required assistance with cleaning bottom with patient tolerating 2 minutes.  Following standing patient felt fatigued and asked to return to supine. Acute OT to continue to follow.    Recommendations for follow up therapy are one component of a multi-disciplinary discharge planning process, led by the attending physician.  Recommendations may be updated based on patient status, additional functional criteria and insurance authorization.    Follow Up Recommendations  Skilled nursing-short term rehab (<3 hours/day)    Assistance Recommended at Discharge Frequent or constant Supervision/Assistance  Patient can return home with the following  A lot of help with walking and/or transfers;A little help with walking and/or transfers;A lot of help with bathing/dressing/bathroom;Direct supervision/assist for medications management;Assistance with cooking/housework;Assist for transportation   Equipment Recommendations  Other (comment) (defer to next venue)    Recommendations for Other Services      Precautions / Restrictions Precautions Precautions: Fall;Other (comment) Precaution Comments: Watch HR Restrictions Weight Bearing Restrictions: No RLE Weight  Bearing: Weight bearing as tolerated LLE Weight Bearing: Weight bearing as tolerated       Mobility Bed Mobility Overal bed mobility: Needs Assistance Bed Mobility: Supine to Sit, Sit to Supine     Supine to sit: HOB elevated, Mod assist Sit to supine: Mod assist, +2 for physical assistance   General bed mobility comments: mod A needed for BLE's with coming to sitting as well as return to supine    Transfers Overall transfer level: Needs assistance Equipment used: Ambulation equipment used Transfers: Sit to/from Stand Sit to Stand: Mod assist, From elevated surface, +2 physical assistance           General transfer comment: Patient attempted to stand with less assistance but required mod assist +2 to come to complete stand in stedy Transfer via Lift Equipment: Stedy   Balance Overall balance assessment: Needs assistance Sitting-balance support: No upper extremity supported, Feet supported Sitting balance-Leahy Scale: Fair     Standing balance support: Bilateral upper extremity supported, Reliant on assistive device for balance Standing balance-Leahy Scale: Poor Standing balance comment: needs UE and external support                           ADL either performed or assessed with clinical judgement   ADL Overall ADL's : Needs assistance/impaired             Lower Body Bathing: Maximal assistance;Sit to/from stand;+2 for physical assistance;+2 for safety/equipment Lower Body Bathing Details (indicate cue type and reason): stood in stedy to clean peri area back                       General ADL Comments: stood at sink to address grooming at  sink but standing in stedy wa too painful following 2 minutes of standing during peri care    Extremity/Trunk Assessment              Vision       Perception     Praxis      Cognition Arousal/Alertness: Awake/alert Behavior During Therapy: WFL for tasks assessed/performed Overall Cognitive  Status: Impaired/Different from baseline Area of Impairment: Problem solving                             Problem Solving: Slow processing General Comments: eager to participate, increased time to follow commands        Exercises      Shoulder Instructions       General Comments      Pertinent Vitals/ Pain       Pain Assessment Pain Assessment: Faces Faces Pain Scale: Hurts whole lot Pain Location: ankles and lower legs in standing Pain Descriptors / Indicators: Aching Pain Intervention(s): Limited activity within patient's tolerance, Monitored during session, Repositioned  Home Living                                          Prior Functioning/Environment              Frequency  Min 2X/week        Progress Toward Goals  OT Goals(current goals can now be found in the care plan section)  Progress towards OT goals: Progressing toward goals  Acute Rehab OT Goals Patient Stated Goal: get better OT Goal Formulation: With patient/family Time For Goal Achievement: 10/17/21 Potential to Achieve Goals: Fair ADL Goals Pt Will Perform Grooming: sitting;standing;with supervision Pt Will Transfer to Toilet: ambulating;bedside commode;regular height toilet;with min guard assist Pt Will Perform Toileting - Clothing Manipulation and hygiene: with min guard assist;sitting/lateral leans;sit to/from stand Additional ADL Goal #1: Pt will be supervision assist for bed mobility, sitting up at EOB in preparation for increased participation in ADL and increased activity tolerance  Plan Discharge plan remains appropriate    Co-evaluation    PT/OT/SLP Co-Evaluation/Treatment: Yes Reason for Co-Treatment: Complexity of the patient's impairments (multi-system involvement);For patient/therapist safety PT goals addressed during session: Mobility/safety with mobility;Balance;Strengthening/ROM;Proper use of DME OT goals addressed during session: ADL's and  self-care      AM-PAC OT "6 Clicks" Daily Activity     Outcome Measure   Help from another person eating meals?: A Little Help from another person taking care of personal grooming?: A Lot Help from another person toileting, which includes using toliet, bedpan, or urinal?: Total Help from another person bathing (including washing, rinsing, drying)?: A Lot Help from another person to put on and taking off regular upper body clothing?: A Little Help from another person to put on and taking off regular lower body clothing?: A Lot 6 Click Score: 13    End of Session Equipment Utilized During Treatment: Gait belt;Other (comment) Charlaine Dalton)  OT Visit Diagnosis: Other abnormalities of gait and mobility (R26.89)   Activity Tolerance Patient tolerated treatment well;Patient limited by pain   Patient Left in bed;with call bell/phone within reach;with bed alarm set;with family/visitor present   Nurse Communication Mobility status        Time: 6712-4580 OT Time Calculation (min): 28 min  Charges: OT General Charges $OT Visit: 1 Visit OT Treatments $Self  Care/Home Management : 8-22 mins  Lodema Hong, OTA Acute Rehabilitation Services  Office [phone]   Trixie Dredge 10/08/2021, 1:43 PM

## 2021-10-08 NOTE — Progress Notes (Signed)
NAME:  Joseph Bryan, MRN:  [id number], DOB:  September 12, 1940, LOS: 8 ADMISSION DATE:  09/30/2021, CONSULTATION DATE: 9/20 REFERRING MD: Dr. 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  He was recently admitted from 9/6-9/9 and treated for decompensated heart failure with IV diuresis.  There was some concern during that admission for pulmonary embolism and CT angiogram of the chest was done revealing right upper lobe cavitary pneumonia.  The patient had no infectious complaints and no clinical signs of pneumonia.  He was evaluated by pulmonary at that time and was recommended for 1 month of Augmentin and pulmonary follow-up at that time for additional imaging.  Unfortunately the patient again presents to [facility] emergency department on 9/18 with complaints of lower extremity edema and generalized weakness.  The patient and his wife report good compliance with medical regimen including diuretics and antibiotics, however, lower extremity edema did worsen.  He was admitted to the hospitalist service for CHF treatment and cardiology was consulted.  CTA was repeated and was again negative for PE, however, showed an increase in size of the right upper lobe cavitary lesion despite antibiotics.  PCCM consulted for further recommendations.  The patient again denies infectious symptoms such as fever, chills, and productive cough.  Pertinent  Medical History   has a past medical history of Cancer ([place]), CHF exacerbation ([place]) (09/30/2021), Chronic kidney disease, Coronary artery disease, Glaucoma, History of kidney stones, Hyperlipidemia, Hypertension, Neuromuscular disorder ([place]), Neuropathy, Pneumonia, Renal insufficiency, and Sleep apnea with use of continuous positive airway  pressure (CPAP).   Significant Hospital Events: Including procedures, antibiotic start and stop dates in addition to other pertinent events   9/18 admit for CHF exacerbation. Cavitary lesion worse on CT.  9/22 bronchoscopy canceled due to AF/RVR?  Atrial flutter/tachycardia >> metoprolol 9/24 epistaxis  9/25 bronchoscopy with BAL and brushings 9/25 swallow eval>> dysphagia 2 diet  Interim History / Subjective:   No events overnight Denies chest pain or dyspnea  Objective   Blood pressure (!) 105/53, pulse 88, temperature 98.3 F (36.8 C), temperature source Oral, resp. rate 17, height _0  (1.854 m), weight 119.1 kg, SpO2 90 %.        Intake/Output Summary (Last 24 hours) at 10/08/2021 0853 Last data filed at 10/08/2021 1696 Gross per 24 hour  Intake 250 ml  Output 2200 ml  Net -1950 ml    Filed Weights   10/06/21 0037 10/07/21 0310 10/08/21 0500  Weight: 115.3 kg 115.8 kg 119.1 kg    Examination: Chronically ill man lying in bed MM dry , tongue protrusion S1-S2 regular, no murmur No accessory muscle use, decreased breath sounds bilateral, no rhonchi Soft, nontender abdomen Legs wrapped -anasarca Alert, interactive, follows one-step commands,  Labs show hyponatremia, no leukocytosis, stable anemia Reviewed CT imaging  Resolved Hospital Problem list     Assessment & Plan:   Right upper lobe cavitary lesion: size increasing despite 14 days augmentin. Characteristics of growth argue for infection, favor aspiration -Agree with empiric antibiotics as recommended by ID (Unasyn, Vanco) -Follow BAL data and brushings of right upper lobe , GNR noted -Will need longer duration of antibiotics and follow-up of imaging to resolution  Acute on chronic HFrEF : EF 45 to 50% Atrial fibrillation CAD -Management per cardiology   Cirrhosis with ascites/hypoalbuminemia/anasarca CKD stage  II -Per primary  Kara Mead MD. [place] Flood. [place] [facility] Pager :  [phone]  If no response to pager , please call [phone] until 7 pm After 7:00 pm call Elink  [phone]   10/08/2021

## 2021-10-08 NOTE — Progress Notes (Addendum)
Progress Note   Patient: Joseph Bryan JSE:[id number] DOB: 06-05-1940 DOA: 09/30/2021     8 DOS: the patient was seen and examined on 10/08/2021   Brief hospital course: Joseph Bryan was admitted to the hospital with the working diagnosis of decompensated heart failure in the setting of worsening right upper lobe cavitary pneumonia and severe protein calorie malnutrition.   81 yo male with the past medical history of coronary artery disease, heart failure, non alcoholic liver cirrhosis, hypertension and CKD who presented with lower extremity edema. Recent hospitalization for heart failure 09/06 to 09/09 treated for heart failure and cavitary pneumonia. On his initial physical examination his blood pressure was 114/55, HR 93, RR 22 and 02 saturation 97%, lungs with no wheezing or rales, heart with S1 and S2 present and rhythmic, abdomen not distended and positive lower extremity edema.   Na 129, K 4,2 CL 90 bicarbonate 28 glucose 119 bun 17 cr 1,18  BNP 125  Wbc 14,2 hgb 12,1 plt 214   Ct chest with bilateral ground glass opacities, mild paraseptal emphysema and bronchiectasis. Positive right upper lobe 6.8 x 5.0 cm increased size cavitation pneumonia / pulmonary abscess.   EKG 125 bpm, left axis deviation, right bundle branch block, sinus with no significant ST segment or T wave changes, noisy baseline.   Patient has been placed on furosemide for diuresis.  Pulmonary consulted for worsening cavitary lesion despite antibiotic therapy.   09/21 patient is diuresing well but continue volume overloaded. Plan for diagnostic bronchoscopy tomorrow.  09/22 patient with tachycardia in the 120 to 130 bpm, cancelled bronchoscopy for today and will be rescheduled for Monday.  09/23 heart rate has improved with metoprolol.  09/24 volume has improved,  09/25 bronchoscopy.  09/26 patient very weak and deconditioned, diuresing with furosemide and pending culture results from bronchoscopy. He will need  radiographic follow up as outpatient.   Assessment and Plan: * Acute on chronic systolic CHF (congestive heart failure) (HCC) Echocardiogram with reduced LV systolic function with EF 45 to 50%, global hypokinesis. Preserved RV systolic function, with moderate TR.    Urine output is 0,737 ml  Systolic blood pressure has been 92 to 105 mmHg.   Continue midodrine for blood pressure support. Diuresis with empagliflozin and spironolactone, Continue  loop diuretic with furosemide 60 mg IV q12 hrs  Patient has Unna boots.  Atrial tachycardia has improved with metoprolol 25 mg po bid.  Better heart rate control.   Right upper  extremity with age indeterminate superficial vein thrombosis involving the right cephalic vein.    Cavitary pneumonia Patient with right upper lobe cavitary pneumonia, (gram negative pneumonia).  Lesion size has increased despite antibiotic therapy for the last 14 days.  Patient has been afebrile and blood cultures with no growth. Bronch with gram negative rods.  Oxymetry is 90% on room air.    Antibiotic therapy with IV Vancomycin and Unasyn.  Follow up with bronch final results Patient will need outpatient prolonged course of antibiotics and serial radiographic follow up.   Swallow dysfunction, continue with dysphagia diet and aspiration precautions.   Hyponatremia Hypervolemic hyponatremia, hypomagnesemia   Renal function with serum cr at 1,34 with K at 5,0 and serum bicarbonate at 27.  Na 132.   Continue with furosemide, spironolactone and empagliflozin.   CAD- s/p CABG in 1998 - patent grafts on cath in 2010 No chest pain  HTN (hypertension) Continue midodrine for blood pressure support.   Class 1 obesity Calculated BMI is 33,6  Patient with anasarca suspect calorie protein malnutrition, will follow up with nutrition recommendations.   Protein-calorie malnutrition, severe Continue with nutritional supplements         Subjective: Patient  with improved dyspnea but not back to baseline, continue to be very weak and deconditioned   Physical Exam: Vitals:   10/07/21 1927 10/08/21 0500 10/08/21 0732 10/08/21 0832  BP: (!) 111/54 (!) 92/56 (!) 102/53 (!) 105/53  Pulse: (!) 101 64 73 88  Resp:  16 17   Temp: (!) 97 F (36.1 C) 97.8 F (36.6 C) 98.3 F (36.8 C)   TempSrc: Oral Oral Oral   SpO2: 91% 91% 90%   Weight:  119.1 kg    Height:       Neurology awake and alert ENT with mild pallor Cardiovascular with S1 and S2 present and rhythmic with no gallops Respiratory with rales bilaterally with no wheezing Abdomen with no distention  Positive lower extremity edema pitting up to the hips, unna boots in place.  Data Reviewed:    Family Communication: I spoke with patient's wife at the bedside, we talked in detail about patient's condition, plan of care and prognosis and all questions were addressed.   Disposition: Status is: Inpatient Remains inpatient appropriate because: pending bronch cultures and further diuresis   Planned Discharge Destination: Skilled nursing facility      Author: Tawni Millers, MD 10/08/2021 2:09 PM  For on call review [email or web address].

## 2021-10-09 DIAGNOSIS — I5023 Acute on chronic systolic (congestive) heart failure: Secondary | ICD-10-CM | POA: Diagnosis not present

## 2021-10-09 LAB — CYTOLOGY - NON PAP

## 2021-10-09 LAB — BASIC METABOLIC PANEL
Anion gap: 10 (ref 5–15)
BUN: 42 mg/dL — ABNORMAL HIGH (ref 8–23)
CO2: 28 mmol/L (ref 22–32)
Calcium: 8.4 mg/dL — ABNORMAL LOW (ref 8.9–10.3)
Chloride: 94 mmol/L — ABNORMAL LOW (ref 98–111)
Creatinine, Ser: 1.47 mg/dL — ABNORMAL HIGH (ref 0.61–1.24)
GFR, Estimated: 48 mL/min — ABNORMAL LOW (ref >60)
Glucose, Bld: 113 mg/dL — ABNORMAL HIGH (ref 70–99)
Potassium: 4.7 mmol/L (ref 3.5–5.1)
Sodium: 132 mmol/L — ABNORMAL LOW (ref 135–145)

## 2021-10-09 LAB — CULTURE, RESPIRATORY W GRAM STAIN

## 2021-10-09 LAB — CBC
HCT: 34.5 % — ABNORMAL LOW (ref 39.0–52.0)
Hemoglobin: 11.3 g/dL — ABNORMAL LOW (ref 13.0–17.0)
MCH: 32.3 pg (ref 26.0–34.0)
MCHC: 32.8 g/dL (ref 30.0–36.0)
MCV: 98.6 fL (ref 80.0–100.0)
Platelets: 170 10*3/uL (ref 150–400)
RBC: 3.5 MIL/uL — ABNORMAL LOW (ref 4.22–5.81)
RDW: 16.6 % — ABNORMAL HIGH (ref 11.5–15.5)
WBC: 13.2 10*3/uL — ABNORMAL HIGH (ref 4.0–10.5)
nRBC: 0 % (ref 0.0–0.2)

## 2021-10-09 LAB — ACID FAST SMEAR (AFB, MYCOBACTERIA): Acid Fast Smear: NEGATIVE

## 2021-10-09 NOTE — Progress Notes (Signed)
Heart Failure Navigator Progress Note  Assessed for Heart & Vascular TOC clinic readiness.  Patient and wife (at bedside) refused Hf TOC , stated they would like to stay with Dr. Claiborne Billings, this would be just too much on him, he can't walk due to weakness.   Navigator available for reassessment of patient.   Earnestine Leys, BSN, Clinical cytogeneticist Only

## 2021-10-09 NOTE — Progress Notes (Signed)
NAME:  Joseph Bryan, MRN:  [id number], DOB:  03/17/1940, LOS: 9 ADMISSION DATE:  09/30/2021, CONSULTATION DATE: 9/20 REFERRING MD: Dr. 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  He was recently admitted from 9/6-9/9 and treated for decompensated heart failure with IV diuresis.  There was some concern during that admission for pulmonary embolism and CT angiogram of the chest was done revealing right upper lobe cavitary pneumonia.  The patient had no infectious complaints and no clinical signs of pneumonia.  He was evaluated by pulmonary at that time and was recommended for 1 month of Augmentin and pulmonary follow-up at that time for additional imaging.  Unfortunately the patient again presents to [facility] emergency department on 9/18 with complaints of lower extremity edema and generalized weakness.  The patient and his wife report good compliance with medical regimen including diuretics and antibiotics, however, lower extremity edema did worsen.  He was admitted to the hospitalist service for CHF treatment and cardiology was consulted.  CTA was repeated and was again negative for PE, however, showed an increase in size of the right upper lobe cavitary lesion despite antibiotics.  PCCM consulted for further recommendations.  The patient again denies infectious symptoms such as fever, chills, and productive cough.  Pertinent  Medical History   has a past medical history of Cancer ([place]), CHF exacerbation ([place]) (09/30/2021), Chronic kidney disease, Coronary artery disease, Glaucoma, History of kidney stones, Hyperlipidemia, Hypertension, Neuromuscular disorder ([place]), Neuropathy, Pneumonia, Renal insufficiency, and Sleep apnea with use of continuous positive airway  pressure (CPAP).   Significant Hospital Events: Including procedures, antibiotic start and stop dates in addition to other pertinent events   9/18 admit for CHF exacerbation. Cavitary lesion worse on CT.  9/22 bronchoscopy canceled due to AF/RVR?  Atrial flutter/tachycardia >> metoprolol 9/24 epistaxis  9/25 bronchoscopy with BAL and brushings 9/25 swallow eval>> dysphagia 2 diet  Interim History / Subjective:   Feels better. Still weak. Was able to get up and walk with a rolling walker   Objective   Blood pressure (!) 95/48, pulse 67, temperature 97.8 F (36.6 C), temperature source Oral, resp. rate 19, height _0  (1.854 m), weight 116.4 kg, SpO2 98 %.        Intake/Output Summary (Last 24 hours) at 10/09/2021 0727 Last data filed at 10/09/2021 0416 Gross per 24 hour  Intake 460 ml  Output 2000 ml  Net -1540 ml   Filed Weights   10/07/21 0310 10/08/21 0500 10/09/21 0013  Weight: 115.8 kg 119.1 kg 116.4 kg    Examination: Gen: elderly male, chronically ill, resting in bed  MM dry, tracking appropriately  RRR, s1 s2 Adb: soft, nt nd  Edema in lower extremities improved from last week  Alert following commands   Cultures:  positive Klebsiella oxy, speciation pending   Resolved Hospital Problem list     Assessment & Plan:   Right upper lobe cavitary lesion: size increasing despite 14 days augmentin. Characteristics of growth argue for infection, favor aspiration - continue abx per ID  - BAL cx with kleb oxy. Speciation pending  - he will need 3-4 weeks of antibiotic coverage - repeat CT chest in 4-6 weeks to ensure resolution   Acute on chronic HFrEF : EF 45 to 50% Atrial fibrillation CAD -Management per cardiology  Cirrhosis with ascites/hypoalbuminemia/anasarca CKD stage II -Per primary   Garner Nash, DO [facility] 10/09/2021 7:27 AM

## 2021-10-09 NOTE — Progress Notes (Signed)
PROGRESS NOTE    Joseph Bryan  MCN:[id number] DOB: 1940-03-11 DOA: 09/30/2021 PCP: Leonard Downing, MD  81/M with history of chronic systolic and diastolic CHF, EF 62%, nonalcoholic liver cirrhosis, hypertension, CKD who was recently hospitalized with CHF and cavitary pneumonia 9/6-9/9, discharged on midodrine for hypotension and Augmentin, presented back to the ED with weakness, swelling. -Work-up in the ED noted 2-3+ lower extremity edema, WBC of 14, CT chest noted 6.8X 5.0 cm cavitary right upper lobe pneumonia which is significantly increased in size compared to prior, hepatic cirrhosis with moderate ascites. -Started on IV vancomycin/Unasyn, treated with IV Lasix and midodrine -9/25 underwent bronchoscopy, obtain BAL for culture AFB fungal and cytology   Subjective: Feels weak and tired, breathing a little better  Assessment and Plan:  Acute on chronic systolic CHF (congestive heart failure) (HCC) Echo with EF 45 to 50%, global hypokinesis. Preserved RV function,mod TR.  -Diuresing on IV Lasix he is 8.9 L negative -Complicated by severe hypoalbuminemia and third spacing, albumin is only 1.5 -Blood pressure remains soft limiting further GDMT, continue midodrine -Continue empagliflozin and Aldactone, hold IV Lasix today, bump in creatinine noted  Severe hypoalbuminemia Liver cirrhosis -Contributing to third spacing, volume overload -Lasix and Aldactone as above  Atrial tachycardia  -Improving, continue metoprolol 25 mg po bid.  -In sinus rhythm  Worsening cavitary pneumonia -Etiology is unclear, SLP eval completed, no overt signs of aspiration, possibility of esophageal dysphagia noted, recommended dysphagia 2 diet -Pulmonary following, underwent bronch, follow-up BAL cultures, cytology -Remains on IV vancomycin and Unasyn, BAL culture with Klebsiella, speciation pending -If MRSA negative will switch to oral Augmentin for 3 to 4-week course  ETHICS I think his  overall prognosis is poor, will request palliative consult for goals of care  Right upper  extremity with age indeterminate superficial vein thrombosis involving the right cephalic vein.  -Warm compresses, supportive care  Hyponatremia Hypervolemic hyponatremia, hypomagnesemia  -Improving, monitor  CAD- s/p CABG in 1998 - patent grafts on cath in 2010 No chest pain  HTN (hypertension) Continue midodrine for blood pressure support.   Class 1 obesity Calculated BMI is 33,6  Protein-calorie malnutrition, severe Continue with nutritional supplements   DVT prophylaxis: Lovenox Code Status: DNR Family Communication:d/w pt and wife at bedside Disposition Plan: SNF [facility] 48h  Consultants:    Procedures:   Antimicrobials:    Objective: Vitals:   10/08/21 2113 10/09/21 0013 10/09/21 0411 10/09/21 0815  BP: (!) 95/52 (!) 95/53 (!) 95/48 (!) 114/52  Pulse:  66 67 81  Resp: _0 Temp:  97.6 F (36.4 C) 97.8 F (36.6 C) 97.7 F (36.5 C)  TempSrc:  Oral Oral Oral  SpO2:  92% 98% 93%  Weight:  116.4 kg    Height:        Intake/Output Summary (Last 24 hours) at 10/09/2021 1109 Last data filed at 10/09/2021 0815 Gross per 24 hour  Intake 340 ml  Output 1500 ml  Net -1160 ml   Filed Weights   10/07/21 0310 10/08/21 0500 10/09/21 0013  Weight: 115.8 kg 119.1 kg 116.4 kg    Examination:  General exam: Elderly chronically ill male sitting up in bed, AAOx3, no distress HEENT: No JVD CVS: S1-S2, regular rhythm Lungs: Poor air movement bilaterally, decreased breath sounds at the bases Abdomen: Soft, obese, nontender, bowel sounds present Extremities: Trace edema Skin: No rashes Psychiatry:  Mood & affect appropriate.     Data Reviewed:   CBC: Recent Labs  Lab 10/04/21 0441 10/05/21 0154 10/06/21 0614 10/07/21 0544 10/09/21 0050  WBC 9.6 12.1* 10.3 10.7* 13.2*  HGB 11.9* 11.3* 11.6* 11.7* 11.3*  HCT 33.9* 32.7* 33.8* 33.8* 34.5*  MCV 94.7 96.2 96.3  96.0 98.6  PLT 176 178 146* 179 601   Basic Metabolic Panel: Recent Labs  Lab 10/03/21 0619 10/04/21 0441 10/05/21 0154 10/06/21 0614 10/07/21 0544 10/08/21 0116 10/09/21 0050  NA 130* 132* 130* 130* 130* 132* 132*  K 3.0* 3.7 4.0 3.4* 3.7 5.0 4.7  CL 90* 91* 91* 92* 94* 97* 94*  CO2 _0 GLUCOSE 84 82 93 81 82 143* 113*  BUN _1 24* 29* 36* 42*  CREATININE 1.11 1.12 1.19 1.24 1.34* 1.34* 1.47*  CALCIUM 7.9* 8.3* 8.2* 8.0* 8.1* 8.3* 8.4*  MG 1.7 2.0 2.0  --   --   --   --    GFR: Estimated Creatinine Clearance: 52.7 mL/min (A) (by C-G formula based on SCr of 1.47 mg/dL (H)). Liver Function Tests: Recent Labs  Lab 10/04/21 0441  AST 51*  ALT 21  ALKPHOS 151*  BILITOT 2.0*  PROT 7.0  ALBUMIN 1.5*   No results for input(s): "LIPASE", "AMYLASE" in the last 168 hours. No results for input(s): "AMMONIA" in the last 168 hours. Coagulation Profile: No results for input(s): "INR", "PROTIME" in the last 168 hours. Cardiac Enzymes: No results for input(s): "CKTOTAL", "CKMB", "CKMBINDEX", "TROPONINI" in the last 168 hours. BNP (last 3 results) No results for input(s): "PROBNP" in the last 8760 hours. HbA1C: No results for input(s): "HGBA1C" in the last 72 hours. CBG: No results for input(s): "GLUCAP" in the last 168 hours. Lipid Profile: No results for input(s): "CHOL", "HDL", "LDLCALC", "TRIG", "CHOLHDL", "LDLDIRECT" in the last 72 hours. Thyroid Function Tests: No results for input(s): "TSH", "T4TOTAL", "FREET4", "T3FREE", "THYROIDAB" in the last 72 hours. Anemia Panel: No results for input(s): "VITAMINB12", "FOLATE", "FERRITIN", "TIBC", "IRON", "RETICCTPCT" in the last 72 hours. Urine analysis:    Component Value Date/Time   COLORURINE STRAW (A) 11/06/2019 1319   APPEARANCEUR CLEAR 11/06/2019 1319   LABSPEC 1.020 11/06/2019 1319   PHURINE 8.0 11/06/2019 1319   GLUCOSEU NEGATIVE 11/06/2019 1319   HGBUR NEGATIVE 11/06/2019 1319   BILIRUBINUR  NEGATIVE 11/06/2019 1319   KETONESUR 5 (A) 11/06/2019 1319   PROTEINUR NEGATIVE 11/06/2019 1319   UROBILINOGEN 0.2 08/25/2012 1726   NITRITE NEGATIVE 11/06/2019 1319   LEUKOCYTESUR NEGATIVE 11/06/2019 1319   Sepsis Labs: _2 (procalcitonin:4,lacticidven:4)  ) Recent Results (from the past 240 hour(s))  Culture, blood (Routine X 2) w Reflex to ID Panel     Status: None   Collection Time: 09/30/21  9:52 PM   Specimen: BLOOD  Result Value Ref Range Status   Specimen Description BLOOD SITE NOT SPECIFIED  Final   Special Requests   Final    BOTTLES DRAWN AEROBIC AND ANAEROBIC Blood Culture adequate volume   Culture   Final    NO GROWTH 5 DAYS Performed at [facility], [place] [address], [place], [place] [address]    Report Status 10/05/2021 FINAL  Final  Culture, blood (Routine X 2) w Reflex to ID Panel     Status: None   Collection Time: 09/30/21  9:54 PM   Specimen: BLOOD  Result Value Ref Range Status   Specimen Description BLOOD SITE NOT SPECIFIED  Final   Special Requests   Final    BOTTLES DRAWN AEROBIC AND ANAEROBIC Blood  Culture adequate volume   Culture   Final    NO GROWTH 5 DAYS Performed at [facility], [place] [address] [place], [place] [address]    Report Status 10/05/2021 FINAL  Final  SARS Coronavirus 2 by RT PCR (hospital order, performed in [facility] hospital lab) *cepheid single result test* Anterior Nasal Swab     Status: None   Collection Time: 10/07/21  7:55 AM   Specimen: Anterior Nasal Swab  Result Value Ref Range Status   SARS Coronavirus 2 by RT PCR NEGATIVE NEGATIVE Final    Comment: (NOTE) SARS-CoV-2 target nucleic acids are NOT DETECTED.  The SARS-CoV-2 RNA is generally detectable in upper and lower respiratory specimens during the acute phase of infection. The lowest concentration of SARS-CoV-2 viral copies this assay can detect is 250 copies / mL. A negative result does not preclude SARS-CoV-2 infection and should not be used  as the sole basis for treatment or other patient management decisions.  A negative result may occur with improper specimen collection / handling, submission of specimen other than nasopharyngeal swab, presence of viral mutation(s) within the areas targeted by this assay, and inadequate number of viral copies (<250 copies / mL). A negative result must be combined with clinical observations, patient history, and epidemiological information.  Fact Sheet for Patients:   https://www.patel.info/  Fact Sheet for Healthcare Providers: https://hall.com/  This test is not yet approved or  cleared by the [place] FDA and has been authorized for detection and/or diagnosis of SARS-CoV-2 by FDA under an Emergency Use Authorization (EUA).  This EUA will remain in effect (meaning this test can be used) for the duration of the COVID-19 declaration under Section 564(b)(1) of the Act, 21 U.S.C. section 360bbb-3(b)(1), unless the authorization is terminated or revoked sooner.  Performed at [facility], [place] [address], [place], [place] [address]   Culture, Respiratory w Gram Stain     Status: None   Collection Time: 10/07/21 11:07 AM   Specimen: Bronchial Alveolar Lavage; Respiratory  Result Value Ref Range Status   Specimen Description BRONCHIAL ALVEOLAR LAVAGE  Final   Special Requests NONE  Final   Gram Stain   Final    FEW WBC PRESENT,BOTH PMN AND MONONUCLEAR NO ORGANISMS SEEN Performed at [facility], [address], [place], [place] [address]    Culture MODERATE KLEBSIELLA OXYTOCA  Final   Report Status 10/09/2021 FINAL  Final   Organism ID, Bacteria KLEBSIELLA OXYTOCA  Final      Susceptibility   Klebsiella oxytoca - MIC*    AMPICILLIN >=32 RESISTANT Resistant     CEFAZOLIN 8 SENSITIVE Sensitive     CEFEPIME <=0.12 SENSITIVE Sensitive     CEFTAZIDIME <=1 SENSITIVE Sensitive     CEFTRIAXONE <=0.25 SENSITIVE Sensitive      CIPROFLOXACIN <=0.25 SENSITIVE Sensitive     GENTAMICIN <=1 SENSITIVE Sensitive     IMIPENEM <=0.25 SENSITIVE Sensitive     TRIMETH/SULFA <=20 SENSITIVE Sensitive     AMPICILLIN/SULBACTAM 8 SENSITIVE Sensitive     PIP/TAZO <=4 SENSITIVE Sensitive     * MODERATE KLEBSIELLA OXYTOCA  Anaerobic culture w Gram Stain     Status: None (Preliminary result)   Collection Time: 10/07/21 11:07 AM   Specimen: Bronchial Alveolar Lavage; Respiratory  Result Value Ref Range Status   Specimen Description BRONCHIAL ALVEOLAR LAVAGE  Final   Special Requests NONE  Final   Gram Stain   Final    NO ORGANISMS  SEEN FEW WBC PRESENT, PREDOMINANTLY MONONUCLEAR NO SQUAMOUS EPITHELIAL CELLS SEEN    Culture   Final    CULTURE REINCUBATED FOR BETTER GROWTH Performed at [facility], [place] [address] [place], [place] [address]    Report Status PENDING  Incomplete     Radiology Studies: DG CHEST PORT 1 VIEW  Result Date: 10/07/2021 CLINICAL DATA:  [id number] status post bronchoscopy EXAM: PORTABLE CHEST 1 VIEW COMPARISON:  September 18, 2021, September 30, 2021 FINDINGS: The cardiomediastinal silhouette is unchanged in contour.Status post median sternotomy and CABG. Similar degree of RIGHT-sided pleural prominence. No significant pneumothorax. Revisualization of asymmetric RIGHT apical pleuroparenchymal thickening with a RIGHT upper lung cavitary lesion. Visualized abdomen is unremarkable. IMPRESSION: No significant pneumothorax status post bronchoscopy. Electronically Signed   By: Valentino Saxon M.D.   On: 10/07/2021 12:11     Scheduled Meds:  aspirin EC  81 mg Oral Daily   empagliflozin  10 mg Oral Daily   enoxaparin (LOVENOX) injection  40 mg Subcutaneous Q24H   feeding supplement  237 mL Oral BID BM   furosemide  60 mg Intravenous BID   metoprolol tartrate  25 mg Oral BID   midodrine  10 mg Oral TID WC   spironolactone  25 mg Oral Daily   Continuous Infusions:  ampicillin-sulbactam (UNASYN) IV 3 g  (10/09/21 0823)   vancomycin 1,500 mg (10/09/21 1021)     LOS: 9 days    Time spent: 39mn   PDomenic Polite MD [facility]   10/09/2021, 11:09 AM

## 2021-10-09 NOTE — Progress Notes (Signed)
Nutrition Follow-up  DOCUMENTATION CODES:   Severe malnutrition in context of chronic illness  INTERVENTION:  - Recommend increasing Ensure Plus HP to TID (350kcal, 20g protein/bottle). -If pt unable to increase po intake and within plan of care goals, consider cortrak for nutrition.  NUTRITION DIAGNOSIS:  Severe Malnutrition related to chronic illness, early satiety as evidenced by energy intake < or equal to 75% for > or equal to 1 month, moderate fat depletion, severe muscle depletion, edema, other (comment) (significant 20.3% unintended weight loss in 1 year).  GOAL:  Patient will meet greater than or equal to 90% of their needs  MONITOR:  Supplement acceptance, PO intake  REASON FOR ASSESSMENT:  Assessment of nutrition requirement/status  ASSESSMENT:   Pt is an 81yo M with PMH of CHF, CAD, HLD, HTN, CKD and non alcoholic liver cirrhosis who presents with decompensated heart failure.  Visited pt at bedside this AM with wife present. Pt rweports very poor appetite. Wife reports pt takes a few bites of food and then refuses to continue. He continues to do well with thin liquids. Drinking 2 Ensure Plus HP/day (700kcal, 40g protein/day). Recommend increasing ONS to TID but pt declined at this time. Palliative care was consulted today to determine plan of care goals. If pt unable to increase po intake, and within POC goals consider coretrak for EN.   Observed generalized edema on assessment. Pt c/o increased weakness. Diet down graded to dysphagia 2 per SLP recommendations.  Labs reviewed: Na:132, BG:113, BUN:42, Cr:1.47, GFR:48  Diet Order:   Diet Order             DIET DYS 2 Room service appropriate? Yes; Fluid consistency: Thin  Diet effective now                   EDUCATION NEEDS:  Education needs have been addressed (discussed importance of protein and calories due to deconditioning/malnutrition.)  Skin:  Skin Assessment: Skin Integrity Issues: Skin Integrity  Issues:: Stage II Stage II: sacrum  Last BM:  10/08/21  Height:  Ht Readings from Last 1 Encounters:  10/01/21 '6\' 1"'$  (1.854 m)   Weight:  Wt Readings from Last 1 Encounters:  10/09/21 116.4 kg   BMI:  Body mass index is 33.86 kg/m.  Estimated Nutritional Needs:   Kcal:  1735-2080kcal  Protein:  100-125g  Fluid:  1276m (12068mfluid restriction)  KaCandise BowensMS, RD, LDN, CNSC See AMiON for contact information

## 2021-10-10 ENCOUNTER — Inpatient Hospital Stay (HOSPITAL_COMMUNITY): Payer: Medicare Other

## 2021-10-10 DIAGNOSIS — Z7189 Other specified counseling: Secondary | ICD-10-CM

## 2021-10-10 DIAGNOSIS — J189 Pneumonia, unspecified organism: Secondary | ICD-10-CM | POA: Diagnosis not present

## 2021-10-10 DIAGNOSIS — R609 Edema, unspecified: Secondary | ICD-10-CM | POA: Diagnosis not present

## 2021-10-10 DIAGNOSIS — I471 Supraventricular tachycardia: Secondary | ICD-10-CM | POA: Diagnosis not present

## 2021-10-10 DIAGNOSIS — I5023 Acute on chronic systolic (congestive) heart failure: Secondary | ICD-10-CM | POA: Diagnosis not present

## 2021-10-10 LAB — COMPREHENSIVE METABOLIC PANEL
ALT: 24 U/L (ref 0–44)
AST: 57 U/L — ABNORMAL HIGH (ref 15–41)
Albumin: <1.5 g/dL — ABNORMAL LOW (ref 3.5–5.0)
Alkaline Phosphatase: 132 U/L — ABNORMAL HIGH (ref 38–126)
Anion gap: 9 (ref 5–15)
BUN: 46 mg/dL — ABNORMAL HIGH (ref 8–23)
CO2: 29 mmol/L (ref 22–32)
Calcium: 8.5 mg/dL — ABNORMAL LOW (ref 8.9–10.3)
Chloride: 95 mmol/L — ABNORMAL LOW (ref 98–111)
Creatinine, Ser: 1.43 mg/dL — ABNORMAL HIGH (ref 0.61–1.24)
GFR, Estimated: 49 mL/min — ABNORMAL LOW (ref >60)
Glucose, Bld: 103 mg/dL — ABNORMAL HIGH (ref 70–99)
Potassium: 4.5 mmol/L (ref 3.5–5.1)
Sodium: 133 mmol/L — ABNORMAL LOW (ref 135–145)
Total Bilirubin: 1.7 mg/dL — ABNORMAL HIGH (ref 0.3–1.2)
Total Protein: 7.2 g/dL (ref 6.5–8.1)

## 2021-10-10 LAB — CBC
HCT: 33.7 % — ABNORMAL LOW (ref 39.0–52.0)
Hemoglobin: 11.1 g/dL — ABNORMAL LOW (ref 13.0–17.0)
MCH: 32.6 pg (ref 26.0–34.0)
MCHC: 32.9 g/dL (ref 30.0–36.0)
MCV: 99.1 fL (ref 80.0–100.0)
Platelets: 182 10*3/uL (ref 150–400)
RBC: 3.4 MIL/uL — ABNORMAL LOW (ref 4.22–5.81)
RDW: 16.7 % — ABNORMAL HIGH (ref 11.5–15.5)
WBC: 11 10*3/uL — ABNORMAL HIGH (ref 4.0–10.5)
nRBC: 0 % (ref 0.0–0.2)

## 2021-10-10 MED ORDER — OXYCODONE HCL 5 MG PO TABS
5.0000 mg | ORAL_TABLET | ORAL | Status: DC | PRN
  Administered 2021-10-10 – 2021-10-12 (×3): 5 mg via ORAL
  Filled 2021-10-10 (×3): qty 1

## 2021-10-10 MED ORDER — ALBUMIN HUMAN 25 % IV SOLN
25.0000 g | Freq: Four times a day (QID) | INTRAVENOUS | Status: DC
  Administered 2021-10-10: 25 g via INTRAVENOUS
  Filled 2021-10-10: qty 100

## 2021-10-10 MED ORDER — ONDANSETRON HCL 4 MG/2ML IJ SOLN: 4.0000 mg | Freq: Four times a day (QID) | INTRAMUSCULAR | Status: DC | PRN

## 2021-10-10 MED ORDER — BIOTENE DRY MOUTH MT LIQD: 15.0000 mL | OROMUCOSAL | Status: DC | PRN

## 2021-10-10 MED ORDER — LORAZEPAM 2 MG/ML IJ SOLN: 1.0000 mg | INTRAMUSCULAR | Status: DC | PRN

## 2021-10-10 MED ORDER — MORPHINE SULFATE (PF) 2 MG/ML IV SOLN: 1.0000 mg | INTRAVENOUS | Status: DC | PRN

## 2021-10-10 MED ORDER — GLYCOPYRROLATE 0.2 MG/ML IJ SOLN: 0.2000 mg | INTRAMUSCULAR | Status: DC | PRN

## 2021-10-10 MED ORDER — GLYCOPYRROLATE 1 MG PO TABS: 1.0000 mg | ORAL_TABLET | ORAL | Status: DC | PRN

## 2021-10-10 MED ORDER — FUROSEMIDE 10 MG/ML IJ SOLN: 40.0000 mg | Freq: Two times a day (BID) | INTRAMUSCULAR | Status: DC

## 2021-10-10 MED ORDER — LORAZEPAM 1 MG PO TABS: 1.0000 mg | ORAL_TABLET | ORAL | Status: DC | PRN

## 2021-10-10 MED ORDER — LORAZEPAM 2 MG/ML PO CONC: 1.0000 mg | ORAL | Status: DC | PRN

## 2021-10-10 MED ORDER — POLYVINYL ALCOHOL 1.4 % OP SOLN: 1.0000 [drp] | Freq: Four times a day (QID) | OPHTHALMIC | Status: DC | PRN

## 2021-10-10 MED ORDER — ONDANSETRON 4 MG PO TBDP: 4.0000 mg | ORAL_TABLET | Freq: Four times a day (QID) | ORAL | Status: DC | PRN

## 2021-10-10 MED ORDER — DIPHENHYDRAMINE HCL 50 MG/ML IJ SOLN: 12.5000 mg | INTRAMUSCULAR | Status: DC | PRN

## 2021-10-10 MED ORDER — SENNA 8.6 MG PO TABS: 1.0000 | ORAL_TABLET | Freq: Every evening | ORAL | Status: DC | PRN

## 2021-10-10 NOTE — Consult Note (Signed)
Consultation Note Date: 10/10/2021   Patient Name: Joseph Bryan  DOB: June 23, 1940  MRN: [id number]  Age / Sex: 81 y.o., male  PCP: Leonard Downing, MD Referring Physician: Domenic Polite, MD  Reason for Consultation: Establishing goals of care  HPI/Patient Profile: 81 y.o. male  with past medical history of CAD status post PCI and CABG, RBBB, chronic combined CHF/ischemic cardiomyopathy, nonalcoholic cirrhosis, hypertension, hyperlipidemia, CKD, OSA, GERD admitted on 09/30/2021 with generalized weakness and bilateral lower extremity edema.   Patient was recently admitted from 9/6 to 9/9 for decompensated CHF, hypertension and cavitary pneumonia.  Now admitted for another acute CHF exacerbation and worsening of cavitary pneumonia. PMT has been consulted to assist with goals of care conversation.  Clinical Assessment and Goals of Care:  I have reviewed medical records including EPIC notes, labs and imaging, assessed the patient and then met at the bedside with patient's wife Hassan Rowan to discuss diagnosis prognosis, [place], EOL wishes, disposition and options.  I introduced Palliative Medicine as specialized medical care for people living with serious illness. It focuses on providing relief from the symptoms and stress of a serious illness. The goal is to improve quality of life for both the patient and the family.  We discussed a brief life review of the patient and then focused on their current illness.   I attempted to elicit values and goals of care important to the patient.    Medical History Review and Understanding:  We discussed patient's course of illness and several chronic comorbidities.  Patient and his wife understand the severity of his illness.  They have had some trouble understanding everything but overall realized that ongoing treatments are not helping and might even be making the infection  worse.  Social History: Patient has been married to [place] for 51 years.  They have 1 daughter and 1 son, having lost 1 son to heart failure.  They are very involved in their church.  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  Functional and Nutritional State: Patient's wife shares that prior to admission it took 3 of them to get patient up at all.  During this admission, he has only been able to stand up for 2 minutes on 1 attempt.  He has only had liquids for the past couple of weeks.  Palliative Symptoms: Pain, fatigue, weakness, dyspnea  Code Status: Concepts specific to code status, artifical feeding and hydration, and rehospitalization were considered and discussed.   Discussion: Patient states that the past several days of this hospitalization have been the worst time of his entire life.  Attempted to explore this further, however he states it is so bad that it is hard to know where to start and that he can hardly even bear to discuss it.  His wife is understandably tearful and shares that "he is tired of being poked and prodded at."  We discussed current plans for SNF placement in rehab attempt and what he feels about this.  Jayceon tells me "I do not think I can take it, I wish I could."  We discussed the alternative option of focusing on his comfort and allowing nature to take its course.   We also discussed his home support and whether returning home with hospice would be an option.  Unfortunately, his children work and it was already too difficult for [place] prior to admission.  We then discussed a hospice facility.  Hassan Rowan shares that her sister-in-law was at [facility] for her hospice care and this  was a good experience.  They would be open to looking into this further.  The difference between aggressive medical intervention and comfort care was considered in light of the patient's goals of care. Counseled that patient would no longer receive aggressive medical interventions such as continuous  vital signs, lab work, radiology testing, or medications not focused on comfort. All care would focus on how the patient is looking and feeling. This would include management of any symptoms that may cause discomfort, pain, shortness of breath, cough, nausea, agitation, anxiety, and/or secretions etc. Symptoms would be managed with medications and other non-pharmacological interventions such as spiritual support if requested, repositioning, music therapy, or therapeutic listening. Family verbalized understanding and appreciation.  Sabien is ready for this today and denies needing any further family meetings.  Discussed the importance of continued conversation with family and the medical providers regarding overall plan of care and treatment options, ensuring decisions are within the context of the patient's values and GOCs.   Questions and concerns were addressed.  Hard Choices booklet left for review. The family was encouraged to call with questions or concerns.  PMT will continue to support holistically.  SUMMARY OF RECOMMENDATIONS   -Continue DNR -Patient wishes to transition to comfort care today and his wife is supportive Oxycodone PRN for pain/air hunger/comfort Robinul PRN for excessive secretions Ativan PRN for agitation/anxiety Zofran PRN for nausea Liquifilm tears PRN for dry eyes May have comfort feeding Comfort cart for family Unrestricted visitations in the setting of EOL (per policy) Oxygen PRN 2L or less for comfort. No escalation.   -Psychosocial and emotional support provided -Ongoing discussions regarding potential residential hospice -Spiritual care consult politely declined -PMT will continue to follow and support  Prognosis: < 2 weeks  Discharge Planning: To Be Determined      Primary Diagnoses: Present on Admission:  Acute on chronic systolic CHF (congestive heart failure) ([place])  Hyponatremia  CAD- s/p CABG in 1998 - patent grafts on cath in 2010  HTN  (hypertension)  Class 1 obesity  Protein-calorie malnutrition, severe  Physical Exam Vitals and nursing note reviewed.  Constitutional:      General: He is not in acute distress.    Appearance: He is ill-appearing.     Interventions: Face mask in place.  Cardiovascular:     Rate and Rhythm: Normal rate.  Pulmonary:     Effort: Pulmonary effort is normal.  Neurological:     Mental Status: He is alert and oriented to person, place, and time.  Psychiatric:        Mood and Affect: Mood normal.        Speech: Speech normal.        Behavior: Behavior normal.        Thought Content: Thought content normal.    Vital Signs: BP (!) 119/52 (BP Location: Right Arm)   Pulse 76   Temp 97.7 F (36.5 C) (Oral)   Resp 14   Ht '6\' 1"'  (1.854 m)   Wt 114.7 kg   SpO2 91%   BMI 33.36 kg/m  Pain Scale: 0-10 POSS *See Group Information*: S-Acceptable,Sleep, easy to arouse Pain Score: 0-No pain  SpO2: SpO2: 91 % O2 Device:SpO2: 91 % O2 Flow Rate: .O2 Flow Rate (L/min): 2 L/min  Palliative Assessment/Data: 20%    MDM: high   Starkisha Tullis Johnnette Litter, PA-C  Palliative Medicine Team Team phone # [phone]  Thank you for allowing the Palliative Medicine Team to assist in the care of this patient.  Please utilize secure chat with additional questions, if there is no response within 30 minutes please call the above phone number.  Palliative Medicine Team providers are available by phone from 7am to 7pm daily and can be reached through the team cell phone.  Should this patient require assistance outside of these hours, please call the patient's attending physician.

## 2021-10-10 NOTE — Progress Notes (Signed)
PROGRESS NOTE    Joseph Bryan  GUY:[id number] DOB: [birth year] DOA: 09/30/2021 PCP: Leonard Downing, MD  81/M with history of chronic systolic and diastolic CHF, EF 56%, nonalcoholic liver cirrhosis, hypertension, CKD who was recently hospitalized with CHF and cavitary pneumonia 9/6-9/9, discharged on midodrine for hypotension and Augmentin, presented back to the ED with weakness, swelling. -Work-up in the ED noted 2-3+ lower extremity edema, WBC of 14, CT chest noted 6.8X 5.0 cm cavitary right upper lobe pneumonia which is significantly increased in size compared to prior, hepatic cirrhosis with moderate ascites. -Started on IV vancomycin/Unasyn, treated with IV Lasix and midodrine -9/25 underwent bronchoscopy, obtain BAL for culture AFB fungal and cytology   Subjective: Feels weak and tired, breathing a little better  Assessment and Plan:  Acute on chronic systolic CHF (congestive heart failure) (HCC) Echo with EF 45 to 50%, global hypokinesis. Preserved RV function,mod TR.  -Diuresing on IV Lasix, he is 10.1 L negative -Complicated by severe hypoalbuminemia and third spacing, albumin is only 1.5 -Blood pressure remains soft limiting further GDMT, continue midodrine -Continue empagliflozin and Aldactone, repeat IV Lasix with albumin today -PT OT eval completed, SNF recommended -Palliative consulted, prognosis is poor  Severe hypoalbuminemia Liver cirrhosis -Contributing to third spacing, volume overload -Lasix and Aldactone as above  Atrial tachycardia  -Improving, continue metoprolol 25 mg po bid.  -In sinus rhythm  Worsening cavitary pneumonia Dysphagia, aspiration pneumonia -, SLP eval completed, MBS completed-significant dysphagia. Silently aspirating thin liquids (and nectar thick).  Diet changed to Dys 1 diet and honey thick liquids  -Pulmonary following, underwent bronch, follow-up cytology -Remains on IV vancomycin and Unasyn, BAL culture with Klebsiella  oxytoca -Negative for MRSA will discontinue vancomycin, change Unasyn to Augmentin tomorrow to complete 4 to 6-week course  ETHICS I think his overall prognosis is poor, requested palliative consult for goals of care  Right upper  extremity with age indeterminate superficial vein thrombosis involving the right cephalic vein.  -Warm compresses, supportive care  Hyponatremia Hypervolemic hyponatremia, hypomagnesemia  -Improving, monitor  CAD- s/p CABG in 1998 - patent grafts on cath in 2010 No chest pain  HTN (hypertension) Continue midodrine for blood pressure support.   Class 1 obesity Calculated BMI is 33,6  Protein-calorie malnutrition, severe Continue with nutritional supplements   DVT prophylaxis: Lovenox Code Status: DNR Family Communication:d/w pt and wife at bedside Disposition Plan: SNF [facility]  Consultants:    Procedures:   Antimicrobials:    Objective: Vitals:   10/09/21 1529 10/09/21 2026 10/10/21 0030 10/10/21 0434  BP: (!) 106/51 (!) 110/49 100/60 (!) 119/52  Pulse: (!) 57 74 71 76  Resp: _0 Temp: 97.6 F (36.4 C) 98.7 F (37.1 C) 97.9 F (36.6 C) 97.7 F (36.5 C)  TempSrc: Oral Oral Oral Oral  SpO2: 91% 92% 92% 91%  Weight:    114.7 kg  Height:        Intake/Output Summary (Last 24 hours) at 10/10/2021 1132 Last data filed at 10/10/2021 0437 Gross per 24 hour  Intake 510 ml  Output 950 ml  Net -440 ml   Filed Weights   10/08/21 0500 10/09/21 0013 10/10/21 0434  Weight: 119.1 kg 116.4 kg 114.7 kg    Examination:  General exam: Frail elderly chronically ill male sitting up in bed, AAOx3, no distress HEENT: No JVD CVS: S1-S2, regular rhythm Lungs: Clear anteriorly, decreased breath sounds at both bases, few scattered rhonchi Abdomen: Soft, obese, nontender, bowel sounds present  Extremities: Trace edema Skin: No rashes Psychiatry:  Mood & affect appropriate.     Data Reviewed:   CBC: Recent Labs  Lab  10/05/21 0154 10/06/21 0614 10/07/21 0544 10/09/21 0050 10/10/21 0023  WBC 12.1* 10.3 10.7* 13.2* 11.0*  HGB 11.3* 11.6* 11.7* 11.3* 11.1*  HCT 32.7* 33.8* 33.8* 34.5* 33.7*  MCV 96.2 96.3 96.0 98.6 99.1  PLT 178 146* 179 170 970   Basic Metabolic Panel: Recent Labs  Lab 10/04/21 0441 10/05/21 0154 10/06/21 0614 10/07/21 0544 10/08/21 0116 10/09/21 0050 10/10/21 0023  NA 132* 130* 130* 130* 132* 132* 133*  K 3.7 4.0 3.4* 3.7 5.0 4.7 4.5  CL 91* 91* 92* 94* 97* 94* 95*  CO2 _0 GLUCOSE 82 93 81 82 143* 113* 103*  BUN 18 22 24* 29* 36* 42* 46*  CREATININE 1.12 1.19 1.24 1.34* 1.34* 1.47* 1.43*  CALCIUM 8.3* 8.2* 8.0* 8.1* 8.3* 8.4* 8.5*  MG 2.0 2.0  --   --   --   --   --    GFR: Estimated Creatinine Clearance: 53.8 mL/min (A) (by C-G formula based on SCr of 1.43 mg/dL (H)). Liver Function Tests: Recent Labs  Lab 10/04/21 0441 10/10/21 0023  AST 51* 57*  ALT 21 24  ALKPHOS 151* 132*  BILITOT 2.0* 1.7*  PROT 7.0 7.2  ALBUMIN 1.5* <1.5*   No results for input(s): "LIPASE", "AMYLASE" in the last 168 hours. No results for input(s): "AMMONIA" in the last 168 hours. Coagulation Profile: No results for input(s): "INR", "PROTIME" in the last 168 hours. Cardiac Enzymes: No results for input(s): "CKTOTAL", "CKMB", "CKMBINDEX", "TROPONINI" in the last 168 hours. BNP (last 3 results) No results for input(s): "PROBNP" in the last 8760 hours. HbA1C: No results for input(s): "HGBA1C" in the last 72 hours. CBG: No results for input(s): "GLUCAP" in the last 168 hours. Lipid Profile: No results for input(s): "CHOL", "HDL", "LDLCALC", "TRIG", "CHOLHDL", "LDLDIRECT" in the last 72 hours. Thyroid Function Tests: No results for input(s): "TSH", "T4TOTAL", "FREET4", "T3FREE", "THYROIDAB" in the last 72 hours. Anemia Panel: No results for input(s): "VITAMINB12", "FOLATE", "FERRITIN", "TIBC", "IRON", "RETICCTPCT" in the last 72 hours. Urine analysis:     Component Value Date/Time   COLORURINE STRAW (A) 11/06/2019 1319   APPEARANCEUR CLEAR 11/06/2019 1319   LABSPEC 1.020 11/06/2019 1319   PHURINE 8.0 11/06/2019 1319   GLUCOSEU NEGATIVE 11/06/2019 1319   HGBUR NEGATIVE 11/06/2019 1319   BILIRUBINUR NEGATIVE 11/06/2019 1319   KETONESUR 5 (A) 11/06/2019 1319   PROTEINUR NEGATIVE 11/06/2019 1319   UROBILINOGEN 0.2 08/25/2012 1726   NITRITE NEGATIVE 11/06/2019 1319   LEUKOCYTESUR NEGATIVE 11/06/2019 1319   Sepsis Labs: _1 (procalcitonin:4,lacticidven:4)  ) Recent Results (from the past 240 hour(s))  Culture, blood (Routine X 2) w Reflex to ID Panel     Status: None   Collection Time: 09/30/21  9:52 PM   Specimen: BLOOD  Result Value Ref Range Status   Specimen Description BLOOD SITE NOT SPECIFIED  Final   Special Requests   Final    BOTTLES DRAWN AEROBIC AND ANAEROBIC Blood Culture adequate volume   Culture   Final    NO GROWTH 5 DAYS Performed at [facility], [place] [address], [place], [place] [address]    Report Status 10/05/2021 FINAL  Final  Culture, blood (Routine X 2) w Reflex to ID Panel     Status: None   Collection Time: 09/30/21  9:54 PM  Specimen: BLOOD  Result Value Ref Range Status   Specimen Description BLOOD SITE NOT SPECIFIED  Final   Special Requests   Final    BOTTLES DRAWN AEROBIC AND ANAEROBIC Blood Culture adequate volume   Culture   Final    NO GROWTH 5 DAYS Performed at [facility], [address] [place], [place] [address]    Report Status 10/05/2021 FINAL  Final  SARS Coronavirus 2 by RT PCR (hospital order, performed in [facility] hospital lab) *cepheid single result test* Anterior Nasal Swab     Status: None   Collection Time: 10/07/21  7:55 AM   Specimen: Anterior Nasal Swab  Result Value Ref Range Status   SARS Coronavirus 2 by RT PCR NEGATIVE NEGATIVE Final    Comment: (NOTE) SARS-CoV-2 target nucleic acids are NOT DETECTED.  The SARS-CoV-2 RNA is generally detectable  in upper and lower respiratory specimens during the acute phase of infection. The lowest concentration of SARS-CoV-2 viral copies this assay can detect is 250 copies / mL. A negative result does not preclude SARS-CoV-2 infection and should not be used as the sole basis for treatment or other patient management decisions.  A negative result may occur with improper specimen collection / handling, submission of specimen other than nasopharyngeal swab, presence of viral mutation(s) within the areas targeted by this assay, and inadequate number of viral copies (<250 copies / mL). A negative result must be combined with clinical observations, patient history, and epidemiological information.  Fact Sheet for Patients:   https://www.patel.info/  Fact Sheet for Healthcare Providers: https://hall.com/  This test is not yet approved or  cleared by the [place] FDA and has been authorized for detection and/or diagnosis of SARS-CoV-2 by FDA under an Emergency Use Authorization (EUA).  This EUA will remain in effect (meaning this test can be used) for the duration of the COVID-19 declaration under Section 564(b)(1) of the Act, 21 U.S.C. section 360bbb-3(b)(1), unless the authorization is terminated or revoked sooner.  Performed at [facility], [place] [address] [place], [place] [address]   Culture, Respiratory w Gram Stain     Status: None   Collection Time: 10/07/21 11:07 AM   Specimen: Bronchial Alveolar Lavage; Respiratory  Result Value Ref Range Status   Specimen Description BRONCHIAL ALVEOLAR LAVAGE  Final   Special Requests NONE  Final   Gram Stain   Final    FEW WBC PRESENT,BOTH PMN AND MONONUCLEAR NO ORGANISMS SEEN Performed at [facility], [address], [place], [place] [address]    Culture MODERATE KLEBSIELLA OXYTOCA  Final   Report Status 10/09/2021 FINAL  Final   Organism ID, Bacteria KLEBSIELLA OXYTOCA  Final       Susceptibility   Klebsiella oxytoca - MIC*    AMPICILLIN >=32 RESISTANT Resistant     CEFAZOLIN 8 SENSITIVE Sensitive     CEFEPIME <=0.12 SENSITIVE Sensitive     CEFTAZIDIME <=1 SENSITIVE Sensitive     CEFTRIAXONE <=0.25 SENSITIVE Sensitive     CIPROFLOXACIN <=0.25 SENSITIVE Sensitive     GENTAMICIN <=1 SENSITIVE Sensitive     IMIPENEM <=0.25 SENSITIVE Sensitive     TRIMETH/SULFA <=20 SENSITIVE Sensitive     AMPICILLIN/SULBACTAM 8 SENSITIVE Sensitive     PIP/TAZO <=4 SENSITIVE Sensitive     * MODERATE KLEBSIELLA OXYTOCA  Acid Fast Smear (AFB)     Status: None   Collection Time: 10/07/21 11:07 AM   Specimen: Bronchial Alveolar Lavage; Respiratory  Result Value  Ref Range Status   AFB Specimen Processing Concentration  Final   Acid Fast Smear Negative  Final    Comment: (NOTE) Performed At: [facility] [place], [place] [id number] Rush Farmer MD HW:[id number]    Source (AFB) BRONCHIAL ALVEOLAR LAVAGE  Final    Comment: Performed at [facility], [place] [address], [place], [place] [address]  Anaerobic culture w Gram Stain     Status: None (Preliminary result)   Collection Time: 10/07/21 11:07 AM   Specimen: Bronchial Alveolar Lavage; Respiratory  Result Value Ref Range Status   Specimen Description BRONCHIAL ALVEOLAR LAVAGE  Final   Special Requests NONE  Final   Gram Stain   Final    NO ORGANISMS SEEN FEW WBC PRESENT, PREDOMINANTLY MONONUCLEAR NO SQUAMOUS EPITHELIAL CELLS SEEN Performed at [facility], [place] [address] [place], [place] [address]    Culture   Final    NO ANAEROBES ISOLATED; CULTURE IN PROGRESS FOR 5 DAYS   Report Status PENDING  Incomplete     Radiology Studies: DG Swallowing Func-Speech Pathology  Result Date: 10/10/2021 Table formatting from the original result was not included. 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  Coronary artery disease   Glaucoma   History of kidney stones   Hyperlipidemia   Hypertension   Neuromuscular disorder (HCC)   neuropathy feet  Neuropathy   feet  Pneumonia   Renal insufficiency   Sleep apnea with use of continuous positive airway pressure (CPAP)   uses CPAP Past Surgical History: Past Surgical History: Procedure Laterality Date  APPENDECTOMY  2014  BRONCHIAL BRUSHINGS  10/07/2021  Procedure: BRONCHIAL BRUSHINGS;  Surgeon: Rigoberto Noel, MD;  Location: [facility];  Service: Cardiopulmonary;;  BRONCHIAL WASHINGS  10/07/2021  Procedure: BRONCHIAL WASHINGS;  Surgeon: Rigoberto Noel, MD;  Location: [facility];  Service: Cardiopulmonary;;  CHOLECYSTECTOMY N/A 06/21/2019  Procedure: LAPAROSCOPIC CHOLECYSTECTOMY WITH INTRAOPERATIVE CHOLANGIOGRAM;  Surgeon: Armandina Gemma, MD;  Location: [facility];  Service: General;  Laterality: N/A;  COLON RESECTION N/A 08/27/2012  Procedure: Diagnostic laparoscopy; Ileocecectomy;  Surgeon: Madilyn Hook, DO;  Location: [facility];  Service: General;  Laterality: N/A;  COLONOSCOPY WITH PROPOFOL N/A 04/09/2015  Procedure: COLONOSCOPY WITH PROPOFOL;  Surgeon: Garlan Fair, MD;  [facility];  Service: Endoscopy;  Laterality: N/A;  CORONARY ARTERY BYPASS GRAFT  1998  x5  CYSTOSCOPY/URETEROSCOPY/HOLMIUM LASER/STENT PLACEMENT Right 02/25/2018  Procedure: RIGHT URETEROSCOPY/HOLMIUM LASER/ STONE REMOVAL  STENT PLACEMENT;  Surgeon: Ardis Hughs, MD;  Location[facility];  Service: Urology;  Laterality: Right;  ERCP N/A 06/22/2019  Procedure: ENDOSCOPIC RETROGRADE CHOLANGIOPANCREATOGRAPHY (ERCP);  Surgeon: Clarene Essex,  MD;  Location[facility];  Service: Endoscopy;  Laterality: N/A;  EYE SURGERY    cataract and glaucome  LUMBAR LAMINECTOMY/DECOMPRESSION MICRODISCECTOMY N/A 10/19/2015  Procedure: Lumbar three to four LAMINECTOMY/FORAMINOTOMY;  Surgeon: Erline Levine, MD;  Location: [facility];  Service: Neurosurgery;  Laterality: N/A;  LUNG SURGERY  yrs ago  right and left lungs - "had air pockets"  birth defect  REMOVAL OF STONES  06/22/2019  Procedure: REMOVAL OF STONES;  Surgeon: Clarene Essex, MD;  [facility];  Service: Endoscopy;;  SPHINCTEROTOMY  06/22/2019  Procedure: Joan Mayans;  Surgeon: Clarene Essex, MD;  [facility];  Service: Endoscopy;;  VIDEO BRONCHOSCOPY Right 10/07/2021  Procedure: VIDEO BRONCHOSCOPY WITHOUT FLUORO;  Surgeon: Rigoberto Noel, MD;  Location: [facility];  Service: Cardiopulmonary;  Laterality: Right; HPI: 81yo male admitted 09/30/21 with weakness and BLE edema since DC from [facility]. CT showed Right upper lobe cavitary lesion concerning for PNA with aspiration favored per PCCM.\ PMH: HFrEF, HTN, CAD s/p CABG (1998), patent grafts on cath (2010), CKD, right bundle branch bloclk, HLD, OSA on CPAP, cancer (appendix), neuropathy, PNA, non-alcoholic liver cirrhosis  Subjective: pt says it is very effortful to eat  Recommendations for follow up therapy are one component of a multi-disciplinary discharge planning process, led by the attending physician.  Recommendations may be updated based on patient status, additional functional criteria and insurance authorization. Assessment / Plan / Recommendation   10/10/2021   9:00 AM Clinical Impressions Clinical Impression Pt presents with a moderate oropharyngeal dysphagia that includes silent aspiration. Oral deficits are significant and suspect contribute to limited PO intake. Oral phase is prolonged and at times, appears to be effortful, intermittently requiring cues to initiate oral transit/swallowing. He has oral holding with thinner consistencies and  as they become thicker (honey thick liquids and solids/purees), he has more lingual pumping. Despite efforts to push bolus posteriorly, he consistently leaves oral residue behind, with even thin liquids pooling sublingually. Pt cannot clear oral residue completely - especially what is under his tongue. He had to orally expectorate barium intermittently throughout the study and endorses having to spit during meals. Pt has almost no mastication that occurs with a small piece of graham cracker, instead letting it melt betwee his tongue and hard palate, then manipulating it with his tongue. Pharyngeally he has reduced base of tongue retraction and hyolaryngeal elevation/excursion. His epiglottis does not fully invert and he does not achieve full airway protection. Thin liquids are aspirating during the swallow when he cannot close his airway off sufficiently. This often starts as penetration but penetrates are not cleared and spill below the true vocal folds (PAS 8). This happens with nectar thick liquids to a lesser degree, but he also aspirates the vallecular residue (PAS 8). Pt does not have overt aspiration or frank penetration with honey thick liquids or solids, although there is increased pharyngeal residue (primarily in the valleculae but spills down the lateral channels and sits in the pyriform sinuses). Pt also had significant difficulty getting honey thick boluses from a cup and could not get them via straw. He was able to get the most liquid in his mouth when offered via spoon. Education was shared with pt and despite acknowledging how effortful meals are, he initially was hesitant to any diet modifications. He was ultimately agreeable to try Dys 1 (puree) diet and honey thick liquids (via spoon or cup) at least until he could also speak with his doctors. SLP Visit Diagnosis Dysphagia, oropharyngeal phase (R13.12) Impact on safety and function Moderate aspiration risk;Risk for inadequate nutrition/hydration      10/10/2021   9:00 AM Treatment Recommendations Treatment Recommendations Therapy as outlined in treatment plan below     10/10/2021   9:00 AM Prognosis Prognosis for Safe Diet Advancement Fair Barriers to Reach Goals Severity of deficits   10/10/2021   9:00 AM Diet Recommendations SLP Diet Recommendations Dysphagia 1 (Puree) solids;Honey thick liquids  Liquid Administration via Cup;Spoon Medication Administration Crushed with puree Compensations Minimize environmental distractions;Slow rate;Small sips/bites;Follow solids with liquid;Other (Comment) Postural Changes Seated upright at 90 degrees;Remain semi-upright after after feeds/meals (Comment)     10/10/2021   9:00 AM Other Recommendations Oral Care Recommendations Oral care BID Other Recommendations Order thickener from pharmacy;Prohibited food (jello, ice cream, thin soups);Remove water pitcher;Have oral suction available Follow Up Recommendations Skilled nursing-short term rehab (<3 hours/day) Assistance recommended at discharge Intermittent Supervision/Assistance Functional Status Assessment Patient has had a recent decline in their functional status and demonstrates the ability to make significant improvements in function in a reasonable and predictable amount of time.   10/10/2021   9:00 AM Frequency and Duration  Speech Therapy Frequency (ACUTE ONLY) min 2x/week Treatment Duration 2 weeks     10/10/2021   9:00 AM Oral Phase Oral Phase Impaired Oral - Honey Teaspoon Holding of bolus;Weak lingual manipulation;Reduced posterior propulsion;Lingual/palatal residue;Decreased bolus cohesion;Lingual pumping;Delayed oral transit Oral - Honey Cup Holding of bolus;Weak lingual manipulation;Reduced posterior propulsion;Lingual/palatal residue;Decreased bolus cohesion;Lingual pumping;Delayed oral transit Oral - Nectar Cup Holding of bolus;Weak lingual manipulation;Reduced posterior propulsion;Lingual/palatal residue;Decreased bolus cohesion;Premature spillage Oral - Thin  Cup Holding of bolus;Weak lingual manipulation;Reduced posterior propulsion;Lingual/palatal residue;Decreased bolus cohesion Oral - Thin Straw Holding of bolus;Weak lingual manipulation;Reduced posterior propulsion;Lingual/palatal residue;Decreased bolus cohesion Oral - Puree Holding of bolus;Weak lingual manipulation;Reduced posterior propulsion;Lingual/palatal residue;Decreased bolus cohesion;Lingual pumping;Delayed oral transit Oral - Mech Soft Weak lingual manipulation;Reduced posterior propulsion;Lingual/palatal residue;Decreased bolus cohesion;Lingual pumping;Delayed oral transit;Impaired mastication    10/10/2021   9:00 AM Pharyngeal Phase Pharyngeal Phase Impaired Pharyngeal- Honey Teaspoon Reduced epiglottic inversion;Reduced anterior laryngeal mobility;Reduced laryngeal elevation;Reduced airway/laryngeal closure;Reduced tongue base retraction;Pharyngeal residue - valleculae Pharyngeal- Honey Cup Reduced epiglottic inversion;Reduced anterior laryngeal mobility;Reduced laryngeal elevation;Reduced airway/laryngeal closure;Reduced tongue base retraction;Pharyngeal residue - valleculae Pharyngeal- Nectar Cup Reduced epiglottic inversion;Reduced anterior laryngeal mobility;Reduced laryngeal elevation;Reduced airway/laryngeal closure;Reduced tongue base retraction;Pharyngeal residue - pyriform;Penetration/Aspiration during swallow;Penetration/Apiration after swallow Pharyngeal Material enters airway, passes BELOW cords without attempt by patient to eject out (silent aspiration) Pharyngeal- Thin Cup Reduced epiglottic inversion;Reduced anterior laryngeal mobility;Reduced laryngeal elevation;Reduced airway/laryngeal closure;Reduced tongue base retraction;Pharyngeal residue - pyriform;Penetration/Aspiration during swallow Pharyngeal Material enters airway, passes BELOW cords without attempt by patient to eject out (silent aspiration) Pharyngeal- Thin Straw Reduced epiglottic inversion;Reduced anterior laryngeal  mobility;Reduced laryngeal elevation;Reduced airway/laryngeal closure;Reduced tongue base retraction;Pharyngeal residue - pyriform;Penetration/Aspiration during swallow;Pharyngeal residue - valleculae Pharyngeal Material enters airway, passes BELOW cords without attempt by patient to eject out (silent aspiration) Pharyngeal- Puree Reduced epiglottic inversion;Reduced anterior laryngeal mobility;Reduced laryngeal elevation;Reduced airway/laryngeal closure;Reduced tongue base retraction;Pharyngeal residue - valleculae Pharyngeal- Mechanical Soft Reduced epiglottic inversion;Reduced anterior laryngeal mobility;Reduced laryngeal elevation;Reduced airway/laryngeal closure;Reduced tongue base retraction;Pharyngeal residue - valleculae    10/10/2021   9:00 AM Cervical Esophageal Phase  Cervical Esophageal Phase [facility] Osie Bond., M.A. CCC-SLP Acute Rehabilitation Services Office [phone] Secure chat preferred 10/10/2021, 9:32 AM                       Scheduled Meds:  aspirin EC  81 mg Oral Daily   empagliflozin  10 mg Oral Daily   enoxaparin (LOVENOX) injection  40 mg Subcutaneous Q24H   feeding supplement  237 mL Oral BID BM   furosemide  40 mg Intravenous BID   metoprolol tartrate  25 mg Oral BID   midodrine  10 mg Oral TID WC   spironolactone  25 mg Oral Daily   Continuous Infusions:  albumin human     ampicillin-sulbactam (UNASYN)  IV 3 g (10/10/21 7737)   vancomycin 1,500 mg (10/09/21 1021)     LOS: 10 days    Time spent: 8mn   PDomenic Polite MD [facility]   10/10/2021, 11:32 AM

## 2021-10-10 NOTE — Progress Notes (Signed)
Modified Barium Swallow Progress Note  Patient Details  Name: Joseph Bryan MRN: [id number] Date of Birth: 08-22-1940  Today's Date: 10/10/2021  Modified Barium Swallow completed.  Full report located under Chart Review in the Imaging Section.  Brief recommendations include the following:  Clinical Impression  Pt presents with a moderate oropharyngeal dysphagia that includes silent aspiration. Oral deficits are significant and suspect contribute to limited PO intake. Oral phase is prolonged and at times, appears to be effortful, intermittently requiring cues to initiate oral transit/swallowing. He has oral holding with thinner consistencies and as they become thicker (honey thick liquids and solids/purees), he has more lingual pumping. Despite efforts to push bolus posteriorly, he consistently leaves oral residue behind, with even thin liquids pooling sublingually. Pt cannot clear oral residue completely - especially what is under his tongue. He had to orally expectorate barium intermittently throughout the study and endorses having to spit during meals. Pt has almost no mastication that occurs with a small piece of graham cracker, instead letting it melt betwee his tongue and hard palate, then manipulating it with his tongue. Pharyngeally he has reduced base of tongue retraction and hyolaryngeal elevation/excursion. His epiglottis does not fully invert and he does not achieve full airway protection. Thin liquids are aspirating during the swallow when he cannot close his airway off sufficiently. This often starts as penetration but penetrates are not cleared and spill below the true vocal folds (PAS 8). This happens with nectar thick liquids to a lesser degree, but he also aspirates the vallecular residue (PAS 8). Pt does not have overt aspiration or frank penetration with honey thick liquids or solids, although there is increased pharyngeal residue (primarily in the valleculae but spills down the  lateral channels and sits in the pyriform sinuses). Pt also had significant difficulty getting honey thick boluses from a cup and could not get them via straw. He was able to get the most liquid in his mouth when offered via spoon. Education was shared with pt and despite acknowledging how effortful meals are, he initially was hesitant to any diet modifications. He was ultimately agreeable to try Dys 1 (puree) diet and honey thick liquids (via spoon or cup) at least until he could also speak with his doctors.   Swallow Evaluation Recommendations       SLP Diet Recommendations: Dysphagia 1 (Puree) solids;Honey thick liquids   Liquid Administration via: Cup;Spoon   Medication Administration: Crushed with puree   Supervision: Staff to assist with self feeding;Full supervision/cueing for compensatory strategies   Compensations: Minimize environmental distractions;Slow rate;Small sips/bites;Follow solids with liquid;Other (Comment) (check for oral residue and cue to swallow PRN)   Postural Changes: Seated upright at 90 degrees;Remain semi-upright after after feeds/meals (Comment)   Oral Care Recommendations: Oral care BID   Other Recommendations: Order thickener from pharmacy;Prohibited food (jello, ice cream, thin soups);Remove water pitcher;Have oral suction available    Osie Bond., M.A. [place] Office [phone]  Secure chat preferred  10/10/2021,9:29 AM

## 2021-10-11 DIAGNOSIS — I5023 Acute on chronic systolic (congestive) heart failure: Secondary | ICD-10-CM | POA: Diagnosis not present

## 2021-10-11 DIAGNOSIS — Z7189 Other specified counseling: Secondary | ICD-10-CM

## 2021-10-11 DIAGNOSIS — R609 Edema, unspecified: Secondary | ICD-10-CM | POA: Diagnosis not present

## 2021-10-11 DIAGNOSIS — Z789 Other specified health status: Secondary | ICD-10-CM

## 2021-10-11 DIAGNOSIS — I471 Supraventricular tachycardia: Secondary | ICD-10-CM | POA: Diagnosis not present

## 2021-10-11 DIAGNOSIS — E43 Unspecified severe protein-calorie malnutrition: Secondary | ICD-10-CM | POA: Diagnosis not present

## 2021-10-11 DIAGNOSIS — Z66 Do not resuscitate: Secondary | ICD-10-CM

## 2021-10-11 DIAGNOSIS — J189 Pneumonia, unspecified organism: Secondary | ICD-10-CM | POA: Diagnosis not present

## 2021-10-11 DIAGNOSIS — Z515 Encounter for palliative care: Secondary | ICD-10-CM

## 2021-10-11 DIAGNOSIS — R638 Other symptoms and signs concerning food and fluid intake: Secondary | ICD-10-CM

## 2021-10-11 NOTE — Progress Notes (Signed)
Patient seen and examined, chronically ill frail elderly male with chronic combined heart failure, liver cirrhosis, CKD, dysphagia, worsening cavitary pneumonia, failure to thrive and severe debility -Met with palliative care yesterday, now comfort care -Palliative team following, may need residential hospice  Joseph Polite, MD

## 2021-10-11 NOTE — TOC Progression Note (Addendum)
Transition of Care [facility]) - Progression Note    Patient Details  Name: Joseph Bryan MRN: [id number] Date of Birth: [birth year]  [facility]) CM/SW Contact  Zenon Mayo, RN Phone Number: 10/11/2021, 3:32 PM  Clinical Narrative:    NCM spoke with Cecille Rubin , the daughter in the room, offered choice, for home with hospice,  she would like [facility] .  Shanita with [facility] called the daughter in the room, plan is they will need hospital bed, bsc and bedside table,  which will be delivered today and plan for dc tomorrow. He will need to be transported by ambulance,  address confirmed.    Expected Discharge Plan: [place] Barriers to Discharge: Hospice Bed not available  Expected Discharge Plan and Services Expected Discharge Plan: [place]     Post Acute Care Choice: Residential Hospice Bed                                         Social Determinants of Health (SDOH) Interventions    Readmission Risk Interventions     No data to display

## 2021-10-11 NOTE — Progress Notes (Addendum)
Daily Progress Note   Patient Name: Joseph Bryan       Date: 10/11/2021 DOB: 06-Feb-1940  Age: 81 y.o. MRN#: [id number] Attending Physician: Domenic Polite, MD Primary Care Physician: Leonard Downing, MD Admit Date: 09/30/2021  Reason for Consultation/Follow-up: Establishing goals of care, Non pain symptom management, Pain control, Psychosocial/spiritual support, and Terminal Care  Subjective: Chart review performed. Received report from primary RN - no acute concerns. Poor appetite.  Went to visit patient at bedside - wife/Joseph Bryan present. Patient was lying in bed awake, alert, oriented, and able to participate in conversation. He is very weak appearing. No signs or non-verbal gestures of pain or discomfort noted. No respiratory distress, increased work of breathing, or secretions noted. Patient denies pain or shortness of breath. He is on RA. Hassan Rowan offers him sips of water, which he finds joy in. He has trouble drinking from the straw - education provided on alternative methods of providing liquids, such as using a spoon or mouth swab. He is not eating.   Emotional support provided - patient has no symptom concerns at this time. Discussed option of hospice transfer. Clarified information on home vs residential hospice. After discussion, patient and wife are agreeable for his transfer to residential hospice - requesting [facility] as Joseph Bryan's sister received "great care" there during her last days of life. Patient does fall asleep at this point during visit.   Therapeutic listening and emotional support provided as Hassan Rowan reflects on her and patient's life together and being two out of 66 original members of their church over 58 years ago. Chaplain support offered - they decline, as their  preacher is offering support.   All questions and concerns addressed. Encouraged to call with questions and/or concerns. PMT card provided.  Length of Stay: 11  Current Medications: Scheduled Meds:    Continuous Infusions:   PRN Meds: acetaminophen, antiseptic oral rinse, diphenhydrAMINE, glycopyrrolate **OR** glycopyrrolate **OR** glycopyrrolate, LORazepam **OR** LORazepam **OR** LORazepam, morphine injection, ondansetron **OR** ondansetron (ZOFRAN) IV, oxyCODONE, polyvinyl alcohol, senna  Physical Exam Vitals and nursing note reviewed.  Constitutional:      General: He is not in acute distress. Pulmonary:     Effort: No respiratory distress.  Skin:    General: Skin is warm and dry.  Neurological:     Mental Status: He is alert  and oriented to person, place, and time.     Motor: Weakness present.     Comments: drowsy  Psychiatric:        Attention and Perception: Attention normal.        Behavior: Behavior is cooperative.        Cognition and Memory: Cognition and memory normal.             Vital Signs: BP (!) 128/57 (BP Location: Right Arm)   Pulse 86   Temp 97.8 F (36.6 C)   Resp 18   Ht '6\' 1"'$  (1.854 m)   Wt 116.1 kg   SpO2 90%   BMI 33.77 kg/m  SpO2: SpO2: 90 % O2 Device: O2 Device: Room Air O2 Flow Rate: O2 Flow Rate (L/min): 2 L/min  Intake/output summary:  Intake/Output Summary (Last 24 hours) at 10/11/2021 1028 Last data filed at 10/11/2021 1000 Gross per 24 hour  Intake 0 ml  Output 950 ml  Net -950 ml   LBM: Last BM Date : 10/10/21 Baseline Weight: Weight: 115.7 kg Most recent weight: Weight: 116.1 kg       Palliative Assessment/Data: PPS 20%      Patient Active Problem List   Diagnosis Date Noted   Atrial tachycardia ([place])    Class 1 obesity 10/02/2021   Protein-calorie malnutrition, severe 10/02/2021   Peripheral edema    Acute on chronic systolic CHF (congestive heart failure) ([place]) 09/30/2021   Lower extremity edema 09/19/2021    HFrEF (heart failure with reduced ejection fraction) (HCC) 09/19/2021   Hypotension 09/18/2021   Hyponatremia 09/18/2021   AKI (acute kidney injury) ([place]) 09/18/2021   Thrombocytopenia ([place]) 09/18/2021   Cavitary pneumonia 09/18/2021   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 06/21/2019   Cholelithiasis with chronic cholecystitis 06/11/2019   Elevated liver enzymes 06/11/2019   Lumbar spinal stenosis 01/16/2017   Hyperlipidemia LDL goal <70 04/30/2016   Elevated LFTs 04/30/2016   Spinal stenosis, lumbar region, with neurogenic claudication 10/19/2015   Leg edema 10/08/2012   OSA on CPAP 10/08/2012   RBBB 10/08/2012   History of MI (myocardial infarction) 10/08/2012   Anasarca 09/01/2012   Abdominal pain, acute, right lower quadrant 08/26/2012   HTN (hypertension) 08/26/2012   Obesity, unspecified 08/26/2012   CAD- s/p CABG in 1998 - patent grafts on cath in 2010 08/26/2012    Palliative Care Assessment & Plan   Patient Profile: 81 y.o. male  with past medical history of CAD status post PCI and CABG, RBBB, chronic combined CHF/ischemic cardiomyopathy, nonalcoholic cirrhosis, hypertension, hyperlipidemia, CKD, OSA, GERD admitted on 09/30/2021 with generalized weakness and bilateral lower extremity edema.    Patient was recently admitted from 9/6 to 9/9 for decompensated CHF, hypertension and cavitary pneumonia.  Now admitted for another acute CHF exacerbation and worsening of cavitary pneumonia. PMT has been consulted to assist with goals of care conversation.  Assessment: Principal Problem:   Acute on chronic systolic CHF (congestive heart failure) (HCC) Active Problems:   HTN (hypertension)   CAD- s/p CABG in 1998 - patent grafts on cath in 2010   Hyponatremia   Cavitary pneumonia   Class 1 obesity   Protein-calorie malnutrition, severe   Atrial tachycardia (HCC)   Terminal care  **ADDENDUM** Received notification from primary RN that patient's  daughter was at bedside with questions around home hospice option.  2:46 PM - 3:30 PM Went to patient's bedside - wife/Joseph Bryan and daughter/Joseph Bryan present. Education provided on the difference between residential hospice  and home hospice. Home hospice was discussed in detail - all questions answered. Joseph Bryan tells me she is able to take off work to provide patient the recommended 24/7 supervision/assistance for EOL he will need. She feels comfortable providing his EOL care. Patient's preference is to return home with hospice. They understand, if needed, patient can still transition to residential hospice facility at a later date if family are not able to meet his EOL needs.  Answered insurance questions to the best of my ability. Therapeutic listening provided as they reflect on patient's prearranged funeral arrangements. Prognostication was reviewed per their request.  DME discussed. Patient/family will be ready for his discharge home with hospice once DME is delivered, likely tomorrow.  Recommendations/Plan: Continue full comfort measures Continue DNR/DNI as previously documented - durable DNR form completed and placed in shadow chart. Copy was made and will be scanned into Vynca/ACP tab Discharge home with hospice after DME is delivered, likely tomorrow 9/30 - TOC and hospice liaison notified; [facility] consult placed Continue current comfort focused medication regimen  PMT will continue to follow and support holistically  Symptom Management Morphine pain/dyspnea/increased work of breathing/RR>25 Oxycodone PRN pain/dyspnea Tylenol PRN pain/fever Biotin PRN dry mouth Benadryl PRN itching Robinul PRN secretions Ativan PRN anxiety/seizure/sleep/distress Zofran PRN nausea/vomiting Liquifilm Tears PRN dry eye Senna PRN constipation  Goals of Care and Additional Recommendations: Limitations on Scope of Treatment: Full Comfort Care  Code Status:    Code Status Orders  (From admission, onward)            Start     Ordered   10/10/21 1320  Do not attempt resuscitation (DNR)  Continuous       Question Answer Comment  In the event of cardiac or respiratory ARREST Do not call a "code blue"   In the event of cardiac or respiratory ARREST Do not perform Intubation, CPR, defibrillation or ACLS   In the event of cardiac or respiratory ARREST Use medication by any route, position, wound care, and other measures to relive pain and suffering. May use oxygen, suction and manual treatment of airway obstruction as needed for comfort.      10/10/21 1322           Code Status History     Date Active Date Inactive Code Status Order ID Comments User Context   09/30/2021 2107 10/10/2021 1322 DNR [id number]  Shela Leff, MD ED   09/18/2021 2332 09/21/2021 2039 Partial Code [id number]  Orene Desanctis, DO ED   06/21/2019 1131 06/23/2019 1604 Full Code [id number]  Armandina Gemma, MD Inpatient   01/16/2017 1924 01/22/2017 2000 Full Code [id number]  Erline Levine, MD Inpatient   10/19/2015 1710 10/20/2015 1947 Full Code [id number]  Erline Levine, MD Inpatient       Prognosis:  < 2 weeks  Discharge Planning: Hospice facility  Care plan was discussed with primary RN, patient, patient's wife, Dr. Broadus John, [facility] hospice liaison  Thank you for allowing the Palliative Medicine Team to assist in the care of this patient.  Time in-out: 1030-1130/1446-1530 Total time: 105 minutes  Greater than 50%  of this time was spent counseling and coordinating care related to the above assessment and plan.   Lin Landsman, NP  Please contact Palliative Medicine Team phone at [phone] for questions and concerns.   *Portions of this note are a verbal dictation therefore any spelling and/or grammatical errors are due to the "[place] One" system interpretation.

## 2021-10-11 NOTE — TOC Progression Note (Signed)
Transition of Care [facility]) - Progression Note    Patient Details  Name: Joseph Bryan MRN: [id number] Date of Birth: 11-Jan-1941  Transition of Care [facility]) CM/SW [place], [place] Phone Number: 10/11/2021, 12:07 PM  Clinical Narrative:     CSW is notified by PMT via secure chat that family and pt desire transition to [place] and have chosen [facility] facility. [place] is included in secure chat and will review referral. [facility] does not currently have any beds available.   CSW met with pt and pt wife bedside and confirmed choice for [facility].   Expected Discharge Plan: [place] Barriers to Discharge: Hospice Bed not available  Expected Discharge Plan and Services Expected Discharge Plan: [place]     Post Acute Care Choice: Residential Hospice Bed                                         Social Determinants of Health (SDOH) Interventions    Readmission Risk Interventions     No data to display

## 2021-10-11 NOTE — Progress Notes (Signed)
[employer]) Hospital Liaison Note  Referral received for patient/family interest in hospice at home. [place] liaison spoke with patient's daughter Joseph Bryan to confirm interest. Interest confirmed.   Hospice eligibility pending.   Plan is to discharge home tomorrow via ambulance once DME is delivered.  DME needs: hospital bed, BSC, and bedside table.   Please send signed DNR home with patient.   Please send comfort medications/prescriptions home at discharge with patient.   Please call with any questions or concerns. Thank you  Joseph Bryan, [place] Hospital Liaison [phone]

## 2021-10-12 DIAGNOSIS — I5023 Acute on chronic systolic (congestive) heart failure: Secondary | ICD-10-CM | POA: Diagnosis not present

## 2021-10-12 LAB — ANAEROBIC CULTURE W GRAM STAIN: Gram Stain: NONE SEEN

## 2021-10-12 MED ORDER — SENNA 8.6 MG PO TABS: 1.0000 | ORAL_TABLET | Freq: Every evening | ORAL | 0 refills | Status: AC | PRN

## 2021-10-12 MED ORDER — ACETAMINOPHEN 325 MG PO TABS: 650.0000 mg | ORAL_TABLET | Freq: Four times a day (QID) | ORAL | Status: AC | PRN

## 2021-10-12 MED ORDER — LORAZEPAM 2 MG/ML PO CONC: 1.0000 mg | ORAL | 0 refills | Status: AC | PRN

## 2021-10-12 MED ORDER — OXYCODONE HCL 5 MG PO TABS: 5.0000 mg | ORAL_TABLET | ORAL | 0 refills | Status: AC | PRN

## 2021-10-12 MED ORDER — TORSEMIDE 20 MG PO TABS: 40.0000 mg | ORAL_TABLET | Freq: Every day | ORAL | Status: AC

## 2021-10-12 NOTE — Plan of Care (Signed)
  Problem: Education: Goal: Ability to demonstrate management of disease process will improve Outcome: Progressing Goal: Ability to verbalize understanding of medication therapies will improve Outcome: Progressing   

## 2021-10-12 NOTE — TOC Transition Note (Signed)
Transition of Care [facility]) - CM/SW Discharge Note   Patient Details  Name: Joseph Bryan MRN: [id number] Date of Birth: 12/28/1940  Transition of Care [facility]) CM/SW Contact:  Carles Collet, RN Phone Number: 10/12/2021, 10:25 AM   Clinical Narrative:     Spoke w patient and wife at bedside. MD present Plan to DC to home w home hospice through [facility]. Confirmed address and will need PTAR. Wife confirms that hospital bed, BSC, and table have been delivered and set up at the house. I informed her I would be calling [facility] to transport him home this morning.  Forms printed and placed on chart, DNR on chart as well. [facility] aware of plans to DC home this morning.   PTAR called 10:30, bedside nurse notified.  Final next level of care: [place] Barriers to Discharge: Hospice Bed not available   Patient Goals and CMS Choice        Discharge Placement                       Discharge Plan and Services     Post Acute Care Choice: Residential Hospice Bed                               Social Determinants of Health (SDOH) Interventions     Readmission Risk Interventions     No data to display

## 2021-10-12 NOTE — Plan of Care (Signed)
  Problem: Education: Goal: Ability to demonstrate management of disease process will improve 10/12/2021 1118 by Emmaline Life, RN Outcome: Adequate for Discharge 10/12/2021 0816 by Emmaline Life, RN Outcome: Progressing Goal: Ability to verbalize understanding of medication therapies will improve 10/12/2021 1118 by Emmaline Life, RN Outcome: Adequate for Discharge 10/12/2021 0816 by Emmaline Life, RN Outcome: Progressing Goal: Individualized Educational Video(s) Outcome: Adequate for Discharge   Problem: Activity: Goal: Capacity to carry out activities will improve Outcome: Adequate for Discharge   Problem: Cardiac: Goal: Ability to achieve and maintain adequate cardiopulmonary perfusion will improve Outcome: Adequate for Discharge   Problem: Education: Goal: Knowledge of General Education information will improve Description: Including pain rating scale, medication(s)/side effects and non-pharmacologic comfort measures Outcome: Adequate for Discharge   Problem: Health Behavior/Discharge Planning: Goal: Ability to manage health-related needs will improve Outcome: Adequate for Discharge   Problem: Clinical Measurements: Goal: Ability to maintain clinical measurements within normal limits will improve Outcome: Adequate for Discharge Goal: Will remain free from infection Outcome: Adequate for Discharge Goal: Diagnostic test results will improve Outcome: Adequate for Discharge Goal: Respiratory complications will improve Outcome: Adequate for Discharge Goal: Cardiovascular complication will be avoided Outcome: Adequate for Discharge   Problem: Activity: Goal: Risk for activity intolerance will decrease Outcome: Adequate for Discharge   Problem: Nutrition: Goal: Adequate nutrition will be maintained Outcome: Adequate for Discharge   Problem: Coping: Goal: Level of anxiety will decrease Outcome: Adequate for Discharge   Problem: Elimination: Goal: Will  not experience complications related to bowel motility Outcome: Adequate for Discharge Goal: Will not experience complications related to urinary retention Outcome: Adequate for Discharge   Problem: Pain Managment: Goal: General experience of comfort will improve Outcome: Adequate for Discharge   Problem: Safety: Goal: Ability to remain free from injury will improve Outcome: Adequate for Discharge   Problem: Skin Integrity: Goal: Risk for impaired skin integrity will decrease Outcome: Adequate for Discharge   Problem: Malnutrition  (NI-5.2) Goal: Food and/or nutrient delivery Description: Individualized approach for food/nutrient provision. Outcome: Adequate for Discharge   Problem: Acute Rehab OT Goals (only OT should resolve) Goal: Pt. Will Perform Grooming Outcome: Adequate for Discharge Goal: Pt. Will Transfer To Toilet Outcome: Adequate for Discharge Goal: Pt. Will Perform Toileting-Clothing Manipulation Outcome: Adequate for Discharge Goal: OT Additional ADL Goal #1 Outcome: Adequate for Discharge   Problem: SLP Dysphagia Goals Goal: Misc Dysphagia Goal Outcome: Adequate for Discharge   Problem: Education: Goal: Knowledge of the prescribed therapeutic regimen will improve Outcome: Adequate for Discharge   Problem: Coping: Goal: Ability to identify and develop effective coping behavior will improve Outcome: Adequate for Discharge   Problem: Clinical Measurements: Goal: Quality of life will improve Outcome: Adequate for Discharge   Problem: Respiratory: Goal: Verbalizations of increased ease of respirations will increase Outcome: Adequate for Discharge   Problem: Role Relationship: Goal: Family's ability to cope with current situation will improve Outcome: Adequate for Discharge Goal: Ability to verbalize concerns, feelings, and thoughts to partner or family member will improve Outcome: Adequate for Discharge   Problem: Pain Management: Goal:  Satisfaction with pain management regimen will improve Outcome: Adequate for Discharge

## 2021-10-12 NOTE — Progress Notes (Signed)
Patient discharging home with hospice, [facility] transporting patient home. Discharge paperwork including Gold DNR form provided to [facility]. Wife at bedside aware of discharge plan.  Einer Meals, Tivis Ringer, RN

## 2021-10-12 NOTE — Discharge Summary (Signed)
Physician Discharge Summary  KEYSTON ARDOLINO DVV:[id number] DOB: [birth year] DOA: 09/30/2021  PCP: Leonard Downing, MD  Admit date: 09/30/2021 Discharge date: 10/12/2021  Time spent: 45 minutes  Recommendations for Outpatient Follow-up:  Home with hospice for symptom and comfort focused care  Discharge Diagnoses:  Principal Problem:   Acute on chronic systolic CHF (congestive heart failure) (HCC) Liver cirrhosis Dysphagia Aspiration pneumonia Worsening cavitary pneumonia Severe debility Adult failure to thrive Severe hypoalbuminemia   Cavitary pneumonia   Hyponatremia   CAD- s/p CABG in 1998 - patent grafts on cath in 2010   HTN (hypertension)   Class 1 obesity   Protein-calorie malnutrition, severe   Atrial tachycardia ([place])   Discharge Condition: Guarded  Diet recommendation: Low sodium, heart healthy  Filed Weights   10/10/21 0434 10/11/21 0326 10/12/21 0417  Weight: 114.7 kg 116.1 kg 115.7 kg    History of present illness:  81/M with history of chronic systolic and diastolic CHF, EF 62%, nonalcoholic liver cirrhosis, hypertension, CKD who was recently hospitalized with CHF and cavitary pneumonia 9/6-9/9, discharged on midodrine for hypotension and Augmentin, presented back to the ED with weakness, swelling. -Work-up in the ED noted 2-3+ lower extremity edema, WBC of 14, CT chest noted 6.8X 5.0 cm cavitary right upper lobe pneumonia which is significantly increased in size compared to prior, hepatic cirrhosis with moderate ascites. -Started on IV vancomycin/Unasyn, treated with IV Lasix and midodrine -9/25 underwent bronchoscopy, obtain BAL for culture AFB fungal and cytology  Hospital Course:   Acute on chronic systolic CHF (congestive heart failure) (HCC) Echo with EF 45 to 50%, global hypokinesis. Preserved RV function,mod TR.  -Diuresing on IV Lasix, he is 10.1 L negative -Complicated by severe hypoalbuminemia and third spacing, albumin is only 1.5 -Blood  pressure remains soft limiting further GDMT, continue midodrine -Was continued on empagliflozin and Aldactone, diuresed using IV Lasix with albumin today -However patient was extremely debilitated and frail with, chronic combined CHF, liver cirrhosis, CKD, dysphagia, worsening cavitary pneumonia, aspiration and failure to thrive -Palliative consulted, family meeting completed, decision made for home with hospice with comfort focused care   Severe hypoalbuminemia Liver cirrhosis -Albumin less than 1.5 contributing to third spacing, volume overload -Diuresed with Lasix and Aldactone as above -Comfort care   Atrial tachycardia    Worsening cavitary pneumonia Dysphagia, Aspiration pneumonia -, SLP eval completed, MBS completed-significant dysphagia. Silently aspirating thin liquids (and nectar thick).  Diet changed to Dys 1 diet and honey thick liquids  -Pulmonary following, underwent bronch, follow-up cytology -Treated using IV vancomycin and Unasyn, BAL culture with Klebsiella oxytoca -Now comfort care   Right upper  extremity with age indeterminate superficial vein thrombosis involving the right cephalic vein.  -Warm compresses, supportive care   Hyponatremia Hypervolemic hyponatremia, hypomagnesemia    CAD- s/p CABG in 1998 - patent grafts on cath in 2010 No chest pain   HTN (hypertension) -midodrine for blood pressure support.    Class 1 obesity Calculated BMI is 33,6   Protein-calorie malnutrition, severe Continue with nutritional supplements    Consultations: Pulmonary, palliative care Discharge Exam: Vitals:   10/11/21 1100 10/12/21 0417  BP: 122/72 (!) 122/52  Pulse: 89 (!) 102  Resp:  18  Temp: 97.8 F (36.6 C) 97.8 F (36.6 C)  SpO2: 92% 94%   General exam: Frail elderly chronically ill male sitting up in bed, AAOx3, no distress HEENT: No JVD CVS: S1-S2, regular rhythm Lungs: Clear anteriorly, decreased breath sounds at both bases, few scattered  rhonchi Abdomen: Soft, obese, nontender, bowel sounds present Extremities: Trace edema Skin: No rashes Psychiatry:  Mood & affect appropriate.   Discharge Instructions   Discharge Instructions     Diet - low sodium heart healthy   Complete by: As directed    Discharge wound care:   Complete by: As directed    Routine   Increase activity slowly   Complete by: As directed       Allergies as of 10/12/2021       Reactions   Other Rash, Other (See Comments)   CHG soap and wipes patient states today(06/21/19), he does not recall this allergy. He completed the pre-op chg baths without any problems.        Medication List     STOP taking these medications    amoxicillin-clavulanate 875-125 MG tablet Commonly known as: AUGMENTIN   aspirin EC 81 MG tablet   BEE POLLEN PO   ibuprofen 200 MG tablet Commonly known as: ADVIL   midodrine 10 MG tablet Commonly known as: PROAMATINE   pantoprazole 40 MG tablet Commonly known as: PROTONIX   spironolactone 50 MG tablet Commonly known as: ALDACTONE   vitamin B-12 100 MCG tablet Commonly known as: CYANOCOBALAMIN   vitamin C 1000 MG tablet       TAKE these medications    acetaminophen 325 MG tablet Commonly known as: TYLENOL Take 2 tablets (650 mg total) by mouth every 6 (six) hours as needed for moderate pain, mild pain or headache.   LORazepam 2 MG/ML concentrated solution Commonly known as: ATIVAN Place 0.5 mLs (1 mg total) under the tongue every 4 (four) hours as needed for anxiety or sleep.   oxyCODONE 5 MG immediate release tablet Commonly known as: Oxy IR/ROXICODONE Take 1 tablet (5 mg total) by mouth every 4 (four) hours as needed for moderate pain (or dyspnea).   senna 8.6 MG Tabs tablet Commonly known as: SENOKOT Take 1 tablet (8.6 mg total) by mouth at bedtime as needed for up to 10 days for mild constipation.   torsemide 20 MG tablet Commonly known as: DEMADEX Take 2 tablets (40 mg total) by mouth  daily. What changed:  when to take this reasons to take this               Discharge Care Instructions  (From admission, onward)           Start     Ordered   10/12/21 0000  Discharge wound care:       Comments: Routine   10/12/21 0908           Allergies  Allergen Reactions   Other Rash and Other (See Comments)    CHG soap and wipes patient states today(06/21/19), he does not recall this allergy. He completed the pre-op chg baths without any problems.    Follow-up Information     Leonard Downing, MD Follow up.   Specialty: Family Medicine Contact information: [place] [address] [phone]         Troy Sine, MD .   Specialty: Cardiology Contact information: [address] [place] [address] [phone]                  The results of significant diagnostics from this hospitalization (including imaging, microbiology, ancillary and laboratory) are listed below for reference.    Significant Diagnostic Studies: DG Swallowing Func-Speech Pathology  Result Date: 10/10/2021 Table formatting from the original result  was not included. 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  Coronary artery disease   Glaucoma   History of kidney stones   Hyperlipidemia   Hypertension   Neuromuscular disorder (HCC)   neuropathy feet  Neuropathy   feet  Pneumonia   Renal insufficiency   Sleep apnea with use of continuous positive airway pressure (CPAP)   uses CPAP  Past Surgical History: Past Surgical History: Procedure Laterality Date  APPENDECTOMY  2014  BRONCHIAL BRUSHINGS  10/07/2021  Procedure: BRONCHIAL BRUSHINGS;  Surgeon: Rigoberto Noel, MD;  Location: [facility];  Service: Cardiopulmonary;;  BRONCHIAL WASHINGS  10/07/2021  Procedure: BRONCHIAL WASHINGS;  Surgeon: Rigoberto Noel, MD;  Location: [facility];  Service: Cardiopulmonary;;  CHOLECYSTECTOMY N/A 06/21/2019  Procedure: LAPAROSCOPIC CHOLECYSTECTOMY WITH INTRAOPERATIVE CHOLANGIOGRAM;  Surgeon: Armandina Gemma, MD;  Location: [facility];  Service: General;  Laterality: N/A;  COLON RESECTION N/A 08/27/2012  Procedure: Diagnostic laparoscopy; Ileocecectomy;  Surgeon: Madilyn Hook, DO;  Location: [facility];  Service: General;  Laterality: N/A;  COLONOSCOPY WITH PROPOFOL N/A 04/09/2015  Procedure: COLONOSCOPY WITH PROPOFOL;  Surgeon: Garlan Fair, MD;  [facility];  Service: Endoscopy;  Laterality: N/A;  CORONARY ARTERY BYPASS GRAFT  1998  x5  CYSTOSCOPY/URETEROSCOPY/HOLMIUM LASER/STENT PLACEMENT Right 02/25/2018  Procedure: RIGHT URETEROSCOPY/HOLMIUM LASER/ STONE REMOVAL  STENT PLACEMENT;  Surgeon: Ardis Hughs, MD;  Location: [facility];  Service: Urology;  Laterality: Right;  ERCP N/A 06/22/2019  Procedure: ENDOSCOPIC RETROGRADE CHOLANGIOPANCREATOGRAPHY (ERCP);  Surgeon: Clarene Essex, MD;  Location: [facility];  Service: Endoscopy;  Laterality: N/A;  EYE SURGERY    cataract and glaucome  LUMBAR LAMINECTOMY/DECOMPRESSION MICRODISCECTOMY N/A 10/19/2015  Procedure: Lumbar three to four LAMINECTOMY/FORAMINOTOMY;  Surgeon: Erline Levine, MD;  Location: [facility];  Service: Neurosurgery;  Laterality: N/A;  LUNG SURGERY  yrs ago  right and left lungs - "had air pockets"  birth defect  REMOVAL OF STONES  06/22/2019  Procedure: REMOVAL OF STONES;  Surgeon: Clarene Essex, MD;  [facility];  Service: Endoscopy;;  SPHINCTEROTOMY  06/22/2019  Procedure: Joan Mayans;  Surgeon: Clarene Essex, MD;  [facility];   Service: Endoscopy;;  VIDEO BRONCHOSCOPY Right 10/07/2021  Procedure: VIDEO BRONCHOSCOPY WITHOUT FLUORO;  Surgeon: Rigoberto Noel, MD;  Location: [facility];  Service: Cardiopulmonary;  Laterality: Right; HPI: 81yo male admitted 09/30/21 with weakness and BLE edema since DC from [facility]. CT showed Right upper lobe cavitary lesion concerning for PNA with aspiration favored per PCCM.\ PMH: HFrEF, HTN, CAD s/p CABG (1998), patent grafts on cath (2010), CKD, right bundle branch bloclk, HLD, OSA on CPAP, cancer (appendix), neuropathy, PNA, non-alcoholic liver cirrhosis  Subjective: pt says it is very effortful to eat  Recommendations for follow up therapy are one component of a multi-disciplinary discharge planning process, led by the attending physician.  Recommendations may be updated based on patient status, additional functional criteria and insurance authorization. Assessment / Plan / Recommendation   10/10/2021   9:00 AM Clinical Impressions Clinical Impression Pt presents with a moderate oropharyngeal dysphagia that includes silent aspiration. Oral deficits are significant  and suspect contribute to limited PO intake. Oral phase is prolonged and at times, appears to be effortful, intermittently requiring cues to initiate oral transit/swallowing. He has oral holding with thinner consistencies and as they become thicker (honey thick liquids and solids/purees), he has more lingual pumping. Despite efforts to push bolus posteriorly, he consistently leaves oral residue behind, with even thin liquids pooling sublingually. Pt cannot clear oral residue completely - especially what is under his tongue. He had to orally expectorate barium intermittently throughout the study and endorses having to spit during meals. Pt has almost no mastication that occurs with a small piece of graham cracker, instead letting it melt betwee his tongue and hard palate, then manipulating it with his tongue. Pharyngeally he has reduced base of tongue  retraction and hyolaryngeal elevation/excursion. His epiglottis does not fully invert and he does not achieve full airway protection. Thin liquids are aspirating during the swallow when he cannot close his airway off sufficiently. This often starts as penetration but penetrates are not cleared and spill below the true vocal folds (PAS 8). This happens with nectar thick liquids to a lesser degree, but he also aspirates the vallecular residue (PAS 8). Pt does not have overt aspiration or frank penetration with honey thick liquids or solids, although there is increased pharyngeal residue (primarily in the valleculae but spills down the lateral channels and sits in the pyriform sinuses). Pt also had significant difficulty getting honey thick boluses from a cup and could not get them via straw. He was able to get the most liquid in his mouth when offered via spoon. Education was shared with pt and despite acknowledging how effortful meals are, he initially was hesitant to any diet modifications. He was ultimately agreeable to try Dys 1 (puree) diet and honey thick liquids (via spoon or cup) at least until he could also speak with his doctors. SLP Visit Diagnosis Dysphagia, oropharyngeal phase (R13.12) Impact on safety and function Moderate aspiration risk;Risk for inadequate nutrition/hydration     10/10/2021   9:00 AM Treatment Recommendations Treatment Recommendations Therapy as outlined in treatment plan below     10/10/2021   9:00 AM Prognosis Prognosis for Safe Diet Advancement Fair Barriers to Reach Goals Severity of deficits   10/10/2021   9:00 AM Diet Recommendations SLP Diet Recommendations Dysphagia 1 (Puree) solids;Honey thick liquids Liquid Administration via Cup;Spoon Medication Administration Crushed with puree Compensations Minimize environmental distractions;Slow rate;Small sips/bites;Follow solids with liquid;Other (Comment) Postural Changes Seated upright at 90 degrees;Remain semi-upright after after  feeds/meals (Comment)     10/10/2021   9:00 AM Other Recommendations Oral Care Recommendations Oral care BID Other Recommendations Order thickener from pharmacy;Prohibited food (jello, ice cream, thin soups);Remove water pitcher;Have oral suction available Follow Up Recommendations Skilled nursing-short term rehab (<3 hours/day) Assistance recommended at discharge Intermittent Supervision/Assistance Functional Status Assessment Patient has had a recent decline in their functional status and demonstrates the ability to make significant improvements in function in a reasonable and predictable amount of time.   10/10/2021   9:00 AM Frequency and Duration  Speech Therapy Frequency (ACUTE ONLY) min 2x/week Treatment Duration 2 weeks     10/10/2021   9:00 AM Oral Phase Oral Phase Impaired Oral - Honey Teaspoon Holding of bolus;Weak lingual manipulation;Reduced posterior propulsion;Lingual/palatal residue;Decreased bolus cohesion;Lingual pumping;Delayed oral transit Oral - Honey Cup Holding of bolus;Weak lingual manipulation;Reduced posterior propulsion;Lingual/palatal residue;Decreased bolus cohesion;Lingual pumping;Delayed oral transit Oral - Nectar Cup Holding of bolus;Weak lingual manipulation;Reduced posterior propulsion;Lingual/palatal residue;Decreased bolus cohesion;Premature spillage Oral -  Thin Cup Holding of bolus;Weak lingual manipulation;Reduced posterior propulsion;Lingual/palatal residue;Decreased bolus cohesion Oral - Thin Straw Holding of bolus;Weak lingual manipulation;Reduced posterior propulsion;Lingual/palatal residue;Decreased bolus cohesion Oral - Puree Holding of bolus;Weak lingual manipulation;Reduced posterior propulsion;Lingual/palatal residue;Decreased bolus cohesion;Lingual pumping;Delayed oral transit Oral - Mech Soft Weak lingual manipulation;Reduced posterior propulsion;Lingual/palatal residue;Decreased bolus cohesion;Lingual pumping;Delayed oral transit;Impaired mastication    10/10/2021   9:00  AM Pharyngeal Phase Pharyngeal Phase Impaired Pharyngeal- Honey Teaspoon Reduced epiglottic inversion;Reduced anterior laryngeal mobility;Reduced laryngeal elevation;Reduced airway/laryngeal closure;Reduced tongue base retraction;Pharyngeal residue - valleculae Pharyngeal- Honey Cup Reduced epiglottic inversion;Reduced anterior laryngeal mobility;Reduced laryngeal elevation;Reduced airway/laryngeal closure;Reduced tongue base retraction;Pharyngeal residue - valleculae Pharyngeal- Nectar Cup Reduced epiglottic inversion;Reduced anterior laryngeal mobility;Reduced laryngeal elevation;Reduced airway/laryngeal closure;Reduced tongue base retraction;Pharyngeal residue - pyriform;Penetration/Aspiration during swallow;Penetration/Apiration after swallow Pharyngeal Material enters airway, passes BELOW cords without attempt by patient to eject out (silent aspiration) Pharyngeal- Thin Cup Reduced epiglottic inversion;Reduced anterior laryngeal mobility;Reduced laryngeal elevation;Reduced airway/laryngeal closure;Reduced tongue base retraction;Pharyngeal residue - pyriform;Penetration/Aspiration during swallow Pharyngeal Material enters airway, passes BELOW cords without attempt by patient to eject out (silent aspiration) Pharyngeal- Thin Straw Reduced epiglottic inversion;Reduced anterior laryngeal mobility;Reduced laryngeal elevation;Reduced airway/laryngeal closure;Reduced tongue base retraction;Pharyngeal residue - pyriform;Penetration/Aspiration during swallow;Pharyngeal residue - valleculae Pharyngeal Material enters airway, passes BELOW cords without attempt by patient to eject out (silent aspiration) Pharyngeal- Puree Reduced epiglottic inversion;Reduced anterior laryngeal mobility;Reduced laryngeal elevation;Reduced airway/laryngeal closure;Reduced tongue base retraction;Pharyngeal residue - valleculae Pharyngeal- Mechanical Soft Reduced epiglottic inversion;Reduced anterior laryngeal mobility;Reduced laryngeal  elevation;Reduced airway/laryngeal closure;Reduced tongue base retraction;Pharyngeal residue - valleculae    10/10/2021   9:00 AM Cervical Esophageal Phase  Cervical Esophageal Phase [facility] Osie Bond., M.A. [place] [facility] Office [phone] Secure chat preferred 10/10/2021, 9:32 AM                     DG CHEST PORT 1 VIEW  Result Date: 10/07/2021 CLINICAL DATA:  [id number] status post bronchoscopy EXAM: PORTABLE CHEST 1 VIEW COMPARISON:  September 18, 2021, September 30, 2021 FINDINGS: The cardiomediastinal silhouette is unchanged in contour.Status post median sternotomy and CABG. Similar degree of RIGHT-sided pleural prominence. No significant pneumothorax. Revisualization of asymmetric RIGHT apical pleuroparenchymal thickening with a RIGHT upper lung cavitary lesion. Visualized abdomen is unremarkable. IMPRESSION: No significant pneumothorax status post bronchoscopy. Electronically Signed   By: Valentino Saxon M.D.   On: 10/07/2021 12:11   VAS [place] UPPER EXTREMITY VENOUS DUPLEX  Result Date: 10/02/2021 UPPER VENOUS STUDY  Patient Name:  MIRAJ TRUSS  Date of Exam:   10/02/2021 Medical Rec #: [id number]       Accession #:    [id number] Date of Birth: Jul 11, 1940       Patient Gender: M Patient Age:   58 years Exam Location:  [facility] Procedure:      VAS [place] UPPER EXTREMITY VENOUS DUPLEX Referring Phys: Sander Radon --------------------------------------------------------------------------------  Indications: Edema Comparison Study: no prior Performing Technologist: Archie Patten RVS  Examination Guidelines: A complete evaluation includes B-mode imaging, spectral Doppler, color Doppler, and power Doppler as needed of all accessible portions of each vessel. Bilateral testing is considered an integral part of a complete examination. Limited examinations for reoccurring indications may be performed as noted.  Right Findings:  +----------+------------+---------+-----------+----------+-----------------+ RIGHT     CompressiblePhasicitySpontaneousProperties     Summary      +----------+------------+---------+-----------+----------+-----------------+ IJV           Full       Yes       Yes                                +----------+------------+---------+-----------+----------+-----------------+  Subclavian    Full       Yes       Yes                                +----------+------------+---------+-----------+----------+-----------------+ Axillary      Full       Yes       Yes                                +----------+------------+---------+-----------+----------+-----------------+ Brachial      Full       Yes       Yes                                +----------+------------+---------+-----------+----------+-----------------+ Radial        Full                                                    +----------+------------+---------+-----------+----------+-----------------+ Ulnar         Full                                                    +----------+------------+---------+-----------+----------+-----------------+ Cephalic      None                                  Age Indeterminate +----------+------------+---------+-----------+----------+-----------------+ Basilic       Full                                                    +----------+------------+---------+-----------+----------+-----------------+  Left Findings: +----------+------------+---------+-----------+----------+-------+ LEFT      CompressiblePhasicitySpontaneousPropertiesSummary +----------+------------+---------+-----------+----------+-------+ Subclavian               Yes       Yes                      +----------+------------+---------+-----------+----------+-------+  Summary:  Right: No evidence of deep vein thrombosis in the upper extremity. Findings consistent with age indeterminate superficial  vein thrombosis involving the right cephalic vein.  Left: No evidence of thrombosis in the subclavian.  *See table(s) above for measurements and observations.  Diagnosing physician: Jamelle Haring Electronically signed by Jamelle Haring on 10/02/2021 at 4:29:00 PM.    Final    CT Angio Chest PE W and/or Wo Contrast  Result Date: 09/30/2021 CLINICAL DATA:  Lower extremity edema, weakness. EXAM: CT ANGIOGRAPHY CHEST WITH CONTRAST TECHNIQUE: Multidetector CT imaging of the chest was performed using the standard protocol during bolus administration of intravenous contrast. Multiplanar CT image reconstructions and MIPs were obtained to evaluate the vascular anatomy. RADIATION DOSE REDUCTION: This exam was performed according to the departmental dose-optimization program which includes automated exposure control, adjustment of the mA and/or kV according to patient size and/or use of iterative reconstruction technique. CONTRAST:  45m OMNIPAQUE IOHEXOL 350 MG/ML SOLN COMPARISON:  September 18, 2021. FINDINGS: Cardiovascular: Satisfactory opacification of the pulmonary arteries to the segmental level. No evidence of pulmonary embolism. Normal heart size. No pericardial effusion. Status post coronary bypass graft. Atherosclerosis of thoracic aorta is noted without aneurysm or dissection. Mediastinum/Nodes: No enlarged mediastinal, hilar, or axillary lymph nodes. Thyroid gland, trachea, and esophagus demonstrate no significant findings. Lungs/Pleura: No pneumothorax or pleural effusion is noted. 6.8 x 5.0 cm cavitating lesion is noted in right upper lobe which is significantly increased in size compared to prior exam and most consistent with cavitating pneumonia or pulmonary abscess. Mild emphysematous disease is noted. Upper Abdomen: Hepatic cirrhosis with mild to moderate ascites. Musculoskeletal: No chest wall abnormality. No acute or significant osseous findings. Review of the MIP images confirms the above findings.  IMPRESSION: No definite evidence of pulmonary embolus. 6.8 x 5.0 cm cavitating lesion is noted in right upper lobe which is significantly increased in size compared to prior exam and most consistent with cavitating pneumonia or pulmonary abscess. Hepatic cirrhosis with mild to moderate ascites. Aortic Atherosclerosis (ICD10-I70.0) and Emphysema (ICD10-J43.9). Electronically Signed   By: JMarijo ConceptionM.D.   On: 09/30/2021 18:28   ECHOCARDIOGRAM COMPLETE  Result Date: 09/19/2021    ECHOCARDIOGRAM REPORT   Patient Name:   RCOLBERT CURENTONDate of Exam: 09/19/2021 Medical Rec #:  [id number]     Height:       73.0 in Accession #:    [id number]    Weight:       261.0 lb Date of Birth:  6May 17, 1942     BSA:          2.409 m Patient Age:    89years       BP:           100/45 mmHg Patient Gender: M              HR:           66 bpm. Exam Location:  Inpatient Procedure: 2D Echo, Cardiac Doppler, Color Doppler and Intracardiac            Opacification Agent Indications:    CHF  History:        Patient has prior history of Echocardiogram examinations, most                 recent 01/12/2020. CAD; Risk Factors:Hypertension, Dyslipidemia                 and Sleep Apnea.  Sonographer:    CJefferey PicaReferring Phys: [id number] 1. Left ventricular ejection fraction, by estimation, is 45 to 50%. The left ventricle has mildly decreased function. The left ventricle demonstrates global hypokinesis. The left ventricular internal cavity size was mildly dilated. Left ventricular diastolic parameters are consistent with Grade I diastolic dysfunction (impaired relaxation).  2. Right ventricular systolic function is normal. The right ventricular size is normal. There is mildly elevated pulmonary artery systolic pressure.  3. Left atrial size was mildly dilated.  4. Right atrial size was mildly dilated.  5. The mitral valve is normal in structure. Trivial mitral valve regurgitation. No evidence of mitral  stenosis.  6. Tricuspid valve regurgitation is moderate.  7. The aortic valve is tricuspid. Aortic valve regurgitation is trivial. No aortic stenosis is present.  8. The inferior vena cava is normal in size with greater than 50% respiratory variability, suggesting right atrial pressure of 3  mmHg. FINDINGS  Left Ventricle: Left ventricular ejection fraction, by estimation, is 45 to 50%. The left ventricle has mildly decreased function. The left ventricle demonstrates global hypokinesis. The left ventricular internal cavity size was mildly dilated. There is  no left ventricular hypertrophy. Left ventricular diastolic parameters are consistent with Grade I diastolic dysfunction (impaired relaxation). Right Ventricle: The right ventricular size is normal. Right ventricular systolic function is normal. There is mildly elevated pulmonary artery systolic pressure. The tricuspid regurgitant velocity is 3.11 m/s, and with an assumed right atrial pressure of 3 mmHg, the estimated right ventricular systolic pressure is 07.3 mmHg. Left Atrium: Left atrial size was mildly dilated. Right Atrium: Right atrial size was mildly dilated. Pericardium: There is no evidence of pericardial effusion. Mitral Valve: The mitral valve is normal in structure. Trivial mitral valve regurgitation. No evidence of mitral valve stenosis. Tricuspid Valve: The tricuspid valve is normal in structure. Tricuspid valve regurgitation is moderate . No evidence of tricuspid stenosis. Aortic Valve: The aortic valve is tricuspid. Aortic valve regurgitation is trivial. Aortic regurgitation PHT measures 343 msec. No aortic stenosis is present. Aortic valve peak gradient measures 6.9 mmHg. Pulmonic Valve: The pulmonic valve was normal in structure. Pulmonic valve regurgitation is trivial. No evidence of pulmonic stenosis. Aorta: The aortic root is normal in size and structure. Venous: The inferior vena cava is normal in size with greater than 50% respiratory  variability, suggesting right atrial pressure of 3 mmHg. IAS/Shunts: No atrial level shunt detected by color flow Doppler.  LEFT VENTRICLE PLAX 2D LVIDd:         5.50 cm   Diastology LVIDs:         4.50 cm   LV e' medial:    7.05 cm/s LV PW:         0.90 cm   LV E/e' medial:  11.5 LV IVS:        0.80 cm   LV e' lateral:   7.07 cm/s LVOT diam:     2.10 cm   LV E/e' lateral: 11.4 LV SV:         85 LV SV Index:   35 LVOT Area:     3.46 cm  RIGHT VENTRICLE             IVC RV Basal diam:  3.20 cm     IVC diam: 1.70 cm RV S prime:     11.10 cm/s TAPSE (M-mode): 2.6 cm LEFT ATRIUM             Index        RIGHT ATRIUM           Index LA diam:        4.90 cm 2.03 cm/m   RA Area:     22.70 cm LA Vol (A2C):   87.6 ml 36.36 ml/m  RA Volume:   78.60 ml  32.62 ml/m LA Vol (A4C):   71.5 ml 29.67 ml/m LA Biplane Vol: 81.4 ml 33.78 ml/m  AORTIC VALVE                 PULMONIC VALVE AV Area (Vmax): 3.25 cm     PV Vmax:       0.89 m/s AV Vmax:        131.00 cm/s  PV Peak grad:  3.2 mmHg AV Peak Grad:   6.9 mmHg LVOT Vmax:      123.00 cm/s LVOT Vmean:     75.150 cm/s LVOT VTI:  0.246 m AI PHT:         343 msec  AORTA Ao Root diam: 3.40 cm Ao Asc diam:  2.95 cm MITRAL VALVE                TRICUSPID VALVE MV Area (PHT): 4.39 cm     TR Peak grad:   38.7 mmHg MV Decel Time: 173 msec     TR Vmax:        311.00 cm/s MV E velocity: 80.80 cm/s MV A velocity: 103.00 cm/s  SHUNTS MV E/A ratio:  0.78         Systemic VTI:  0.25 m                             Systemic Diam: 2.10 cm Kirk Ruths MD Electronically signed by Kirk Ruths MD Signature Date/Time: 09/19/2021/4:01:46 PM    Final    CT Angio Chest PE W and/or Wo Contrast  Result Date: 09/18/2021 CLINICAL DATA:  Concern for pulmonary embolism. EXAM: CT ANGIOGRAPHY CHEST WITH CONTRAST TECHNIQUE: Multidetector CT imaging of the chest was performed using the standard protocol during bolus administration of intravenous contrast. Multiplanar CT image reconstructions and MIPs  were obtained to evaluate the vascular anatomy. RADIATION DOSE REDUCTION: This exam was performed according to the departmental dose-optimization program which includes automated exposure control, adjustment of the mA and/or kV according to patient size and/or use of iterative reconstruction technique. CONTRAST:  163m OMNIPAQUE IOHEXOL 350 MG/ML SOLN COMPARISON:  Chest radiograph dated 09/18/2021. FINDINGS: Evaluation of this exam is limited due to respiratory motion artifact. Cardiovascular: There is no cardiomegaly or pericardial effusion. There is 3 vessel coronary vascular calcification and postsurgical changes of CABG. There is moderate atherosclerotic calcification of the thoracic aorta. No aneurysmal dilatation. No pulmonary artery embolus identified. Mediastinum/Nodes: Mildly enlarged right hilar lymph node measures 11 mm short axis. No mediastinal adenopathy. The esophagus is grossly unremarkable. No mediastinal fluid collection. Lungs/Pleura: Background of predominantly paraseptal emphysema with right upper lobe subpleural scarring. Patchy area of ground-glass density in the right upper lobe consistent with pneumonia. There is a 2 cm central cavitary component noted concerning for a cavitary infection clinical correlation is recommended. Several additional small scattered ground-glass nodules predominantly in the superior segment of the lower lobes bilaterally suspicious for additional foci of infection. There is no pleural effusion or pneumothorax. The central airways are patent. Small mucous secretion noted in the right mainstem bronchus. Upper Abdomen: Small ascites.  Cholecystectomy.  Cirrhosis. Musculoskeletal: Osteopenia with degenerative changes of the spine. Median sternotomy wires. No acute osseous pathology. Review of the MIP images confirms the above findings. IMPRESSION: 1. No CT evidence of pulmonary artery embolus. 2. Right upper lobe cavitary pneumonia. 3. Mildly enlarged right hilar lymph  node, likely reactive. 4. Cirrhosis with small ascites. 5. Aortic Atherosclerosis (ICD10-I70.0) and Emphysema (ICD10-J43.9). Electronically Signed   By: AAnner CreteM.D.   On: 09/18/2021 22:01   DG Chest 2 View  Result Date: 09/18/2021 CLINICAL DATA:  Cough.  Bilateral leg swelling. EXAM: CHEST - 2 VIEW COMPARISON:  Chest radiographs 02/04/2018 FINDINGS: The cardiac silhouette is partially obscured but appears borderline to mildly enlarged. Prior CABG and aortic atherosclerosis are noted. Lung volumes are diminished with increased interstitial densities bilaterally as well as more confluent, hazy opacity in the right upper lobe. Biapical pleural thickening is noted. No sizable pleural effusion or pneumothorax is identified. No acute osseous abnormality is seen.  IMPRESSION: Low lung volumes with bilateral interstitial and hazy right upper lobe opacities which may reflect pneumonia or edema. Electronically Signed   By: Logan Bores M.D.   On: 09/18/2021 16:55    Microbiology: Recent Results (from the past 240 hour(s))  SARS Coronavirus 2 by RT PCR (hospital order, performed in [facility] hospital lab) *cepheid single result test* Anterior Nasal Swab     Status: None   Collection Time: 10/07/21  7:55 AM   Specimen: Anterior Nasal Swab  Result Value Ref Range Status   SARS Coronavirus 2 by RT PCR NEGATIVE NEGATIVE Final    Comment: (NOTE) SARS-CoV-2 target nucleic acids are NOT DETECTED.  The SARS-CoV-2 RNA is generally detectable in upper and lower respiratory specimens during the acute phase of infection. The lowest concentration of SARS-CoV-2 viral copies this assay can detect is 250 copies / mL. A negative result does not preclude SARS-CoV-2 infection and should not be used as the sole basis for treatment or other patient management decisions.  A negative result may occur with improper specimen collection / handling, submission of specimen other than nasopharyngeal swab, presence of viral  mutation(s) within the areas targeted by this assay, and inadequate number of viral copies (<250 copies / mL). A negative result must be combined with clinical observations, patient history, and epidemiological information.  Fact Sheet for Patients:   https://www.patel.info/  Fact Sheet for Healthcare Providers: https://hall.com/  This test is not yet approved or  cleared by the [place] FDA and has been authorized for detection and/or diagnosis of SARS-CoV-2 by FDA under an Emergency Use Authorization (EUA).  This EUA will remain in effect (meaning this test can be used) for the duration of the COVID-19 declaration under Section 564(b)(1) of the Act, 21 U.S.C. section 360bbb-3(b)(1), unless the authorization is terminated or revoked sooner.  Performed at [facility], [place] [address], [place], [place] [address]   Fungus Culture With Stain     Status: None (Preliminary result)   Collection Time: 10/07/21 11:07 AM   Specimen: Bronchial Alveolar Lavage; Respiratory  Result Value Ref Range Status   Fungus Stain Final report  Final    Comment: (NOTE) Performed At: [facility] [place], [place] [id number] Rush Farmer MD [id number]    Fungus (Mycology) Culture PENDING  Incomplete   Fungal Source BRONCHIAL ALVEOLAR LAVAGE  Final    Comment: Performed at [facility], [place] [address], [place], [place] [address]  Culture, Respiratory w Gram Stain     Status: None   Collection Time: 10/07/21 11:07 AM   Specimen: Bronchial Alveolar Lavage; Respiratory  Result Value Ref Range Status   Specimen Description BRONCHIAL ALVEOLAR LAVAGE  Final   Special Requests NONE  Final   Gram Stain   Final    FEW WBC PRESENT,BOTH PMN AND MONONUCLEAR NO ORGANISMS SEEN Performed at [facility], [address], [place], [place] [address]    Culture MODERATE KLEBSIELLA OXYTOCA  Final   Report Status 10/09/2021  FINAL  Final   Organism ID, Bacteria KLEBSIELLA OXYTOCA  Final      Susceptibility   Klebsiella oxytoca - MIC*    AMPICILLIN >=32 RESISTANT Resistant     CEFAZOLIN 8 SENSITIVE Sensitive     CEFEPIME <=0.12 SENSITIVE Sensitive     CEFTAZIDIME <=1 SENSITIVE Sensitive     CEFTRIAXONE <=0.25 SENSITIVE Sensitive     CIPROFLOXACIN <=0.25 SENSITIVE Sensitive     GENTAMICIN <=1 SENSITIVE Sensitive  IMIPENEM <=0.25 SENSITIVE Sensitive     TRIMETH/SULFA <=20 SENSITIVE Sensitive     AMPICILLIN/SULBACTAM 8 SENSITIVE Sensitive     PIP/TAZO <=4 SENSITIVE Sensitive     * MODERATE KLEBSIELLA OXYTOCA  Acid Fast Smear (AFB)     Status: None   Collection Time: 10/07/21 11:07 AM   Specimen: Bronchial Alveolar Lavage; Respiratory  Result Value Ref Range Status   AFB Specimen Processing Concentration  Final   Acid Fast Smear Negative  Final    Comment: (NOTE) Performed At: [facility] [place], [place] [id number] Rush Farmer MD RK:[id number]    Source (AFB) BRONCHIAL ALVEOLAR LAVAGE  Final    Comment: Performed at [facility], [place] [address] [place], [place] [address]  Anaerobic culture w Gram Stain     Status: None (Preliminary result)   Collection Time: 10/07/21 11:07 AM   Specimen: Bronchial Alveolar Lavage; Respiratory  Result Value Ref Range Status   Specimen Description BRONCHIAL ALVEOLAR LAVAGE  Final   Special Requests NONE  Final   Gram Stain   Final    NO ORGANISMS SEEN FEW WBC PRESENT, PREDOMINANTLY MONONUCLEAR NO SQUAMOUS EPITHELIAL CELLS SEEN Performed at [facility], [place] [address], [place], [place] [address]    Culture   Final    NO ANAEROBES ISOLATED; CULTURE IN PROGRESS FOR 5 DAYS   Report Status PENDING  Incomplete  Fungus Culture Result     Status: None   Collection Time: 10/07/21 11:07 AM  Result Value Ref Range Status   Result 1 Comment  Final    Comment: (NOTE) KOH/Calcofluor preparation:  no fungus observed. Performed At:  [facility] [place], [place] [id number] Rush Farmer MD WN:[id number]      Labs: Basic Metabolic Panel: Recent Labs  Lab 10/06/21 0614 10/07/21 0544 10/08/21 0116 10/09/21 0050 10/10/21 0023  NA 130* 130* 132* 132* 133*  K 3.4* 3.7 5.0 4.7 4.5  CL 92* 94* 97* 94* 95*  CO2 _0 GLUCOSE 81 82 143* 113* 103*  BUN 24* 29* 36* 42* 46*  CREATININE 1.24 1.34* 1.34* 1.47* 1.43*  CALCIUM 8.0* 8.1* 8.3* 8.4* 8.5*   Liver Function Tests: Recent Labs  Lab 10/10/21 0023  AST 57*  ALT 24  ALKPHOS 132*  BILITOT 1.7*  PROT 7.2  ALBUMIN <1.5*   No results for input(s): "LIPASE", "AMYLASE" in the last 168 hours. No results for input(s): "AMMONIA" in the last 168 hours. CBC: Recent Labs  Lab 10/06/21 0614 10/07/21 0544 10/09/21 0050 10/10/21 0023  WBC 10.3 10.7* 13.2* 11.0*  HGB 11.6* 11.7* 11.3* 11.1*  HCT 33.8* 33.8* 34.5* 33.7*  MCV 96.3 96.0 98.6 99.1  PLT 146* 179 170 182   Cardiac Enzymes: No results for input(s): "CKTOTAL", "CKMB", "CKMBINDEX", "TROPONINI" in the last 168 hours. BNP: BNP (last 3 results) Recent Labs    09/18/21 1626 09/30/21 1510 10/04/21 0441  BNP 388.3* 125.8* 267.8*    ProBNP (last 3 results) No results for input(s): "PROBNP" in the last 8760 hours.  CBG: No results for input(s): "GLUCAP" in the last 168 hours.     Signed:  Domenic Polite MD.  [facility] 10/12/2021, 11:10 AM

## 2021-10-17 ENCOUNTER — Ambulatory Visit (HOSPITAL_COMMUNITY): Payer: Medicare Other | Attending: Pulmonary Disease

## 2021-10-22 ENCOUNTER — Inpatient Hospital Stay: Payer: Medicare Other | Admitting: Pulmonary Disease

## 2021-11-05 LAB — FUNGUS CULTURE WITH STAIN

## 2021-11-05 LAB — FUNGUS CULTURE RESULT

## 2021-11-05 LAB — FUNGAL ORGANISM REFLEX

## 2021-11-13 DEATH — deceased

## 2021-11-21 LAB — ACID FAST CULTURE WITH REFLEXED SENSITIVITIES (MYCOBACTERIA): Acid Fast Culture: NEGATIVE
